# Patient Record
Sex: Female | Born: 1935 | Race: Black or African American | Hispanic: No | Smoking: Never smoker
Health system: Southern US, Community
[De-identification: ages and names within clinical notes are randomized; demographics above are authoritative.]

## PROBLEM LIST (undated history)

## (undated) DIAGNOSIS — K5909 Other constipation: Secondary | ICD-10-CM

## (undated) DIAGNOSIS — M1990 Unspecified osteoarthritis, unspecified site: Secondary | ICD-10-CM

## (undated) DIAGNOSIS — J45909 Unspecified asthma, uncomplicated: Secondary | ICD-10-CM

## (undated) DIAGNOSIS — K589 Irritable bowel syndrome without diarrhea: Secondary | ICD-10-CM

## (undated) DIAGNOSIS — I10 Essential (primary) hypertension: Secondary | ICD-10-CM

## (undated) DIAGNOSIS — G309 Alzheimer's disease, unspecified: Secondary | ICD-10-CM

## (undated) DIAGNOSIS — Z860100 Personal history of colon polyps, unspecified: Secondary | ICD-10-CM

## (undated) DIAGNOSIS — M545 Low back pain, unspecified: Secondary | ICD-10-CM

## (undated) DIAGNOSIS — K219 Gastro-esophageal reflux disease without esophagitis: Secondary | ICD-10-CM

## (undated) DIAGNOSIS — N390 Urinary tract infection, site not specified: Secondary | ICD-10-CM

## (undated) DIAGNOSIS — E7800 Pure hypercholesterolemia, unspecified: Secondary | ICD-10-CM

## (undated) DIAGNOSIS — F0280 Dementia in other diseases classified elsewhere without behavioral disturbance: Secondary | ICD-10-CM

## (undated) DIAGNOSIS — F419 Anxiety disorder, unspecified: Secondary | ICD-10-CM

## (undated) DIAGNOSIS — K5730 Diverticulosis of large intestine without perforation or abscess without bleeding: Secondary | ICD-10-CM

## (undated) DIAGNOSIS — E46 Unspecified protein-calorie malnutrition: Secondary | ICD-10-CM

## (undated) DIAGNOSIS — I6381 Other cerebral infarction due to occlusion or stenosis of small artery: Secondary | ICD-10-CM

## (undated) HISTORY — DX: Gastro-esophageal reflux disease without esophagitis: K21.9

## (undated) HISTORY — DX: Unspecified asthma, uncomplicated: J45.909

## (undated) HISTORY — DX: Diverticulosis of large intestine without perforation or abscess without bleeding: K57.30

## (undated) HISTORY — DX: Unspecified osteoarthritis, unspecified site: M19.90

## (undated) HISTORY — DX: Pure hypercholesterolemia, unspecified: E78.00

## (undated) HISTORY — DX: Low back pain: M54.5

## (undated) HISTORY — DX: Low back pain, unspecified: M54.50

## (undated) HISTORY — DX: Personal history of colonic polyps: Z86.010

## (undated) HISTORY — DX: Irritable bowel syndrome, unspecified: K58.9

## (undated) HISTORY — PX: ABDOMINAL HYSTERECTOMY: SHX81

## (undated) HISTORY — DX: Anxiety disorder, unspecified: F41.9

## (undated) HISTORY — DX: Essential (primary) hypertension: I10

## (undated) HISTORY — DX: Urinary tract infection, site not specified: N39.0

## (undated) HISTORY — DX: Personal history of colon polyps, unspecified: Z86.0100

## (undated) HISTORY — DX: Other cerebral infarction due to occlusion or stenosis of small artery: I63.81

---

## 1999-01-17 ENCOUNTER — Ambulatory Visit (HOSPITAL_COMMUNITY): Admission: RE | Admit: 1999-01-17 | Discharge: 1999-01-17 | Payer: Self-pay | Admitting: Gastroenterology

## 1999-01-17 ENCOUNTER — Encounter: Payer: Self-pay | Admitting: Gastroenterology

## 1999-08-22 ENCOUNTER — Encounter: Admission: RE | Admit: 1999-08-22 | Discharge: 1999-09-07 | Payer: Self-pay | Admitting: Family Medicine

## 1999-10-26 ENCOUNTER — Encounter: Admission: RE | Admit: 1999-10-26 | Discharge: 1999-11-21 | Payer: Self-pay | Admitting: Family Medicine

## 2001-02-16 ENCOUNTER — Encounter: Admission: RE | Admit: 2001-02-16 | Discharge: 2001-03-25 | Payer: Self-pay | Admitting: Family Medicine

## 2002-02-03 ENCOUNTER — Encounter: Admission: RE | Admit: 2002-02-03 | Discharge: 2002-03-05 | Payer: Self-pay | Admitting: Family Medicine

## 2002-11-22 ENCOUNTER — Encounter: Payer: Self-pay | Admitting: Emergency Medicine

## 2002-11-22 ENCOUNTER — Emergency Department (HOSPITAL_COMMUNITY): Admission: EM | Admit: 2002-11-22 | Discharge: 2002-11-22 | Payer: Self-pay | Admitting: Emergency Medicine

## 2003-02-07 ENCOUNTER — Encounter: Admission: RE | Admit: 2003-02-07 | Discharge: 2003-05-08 | Payer: Self-pay | Admitting: Family Medicine

## 2003-05-09 ENCOUNTER — Encounter: Admission: RE | Admit: 2003-05-09 | Discharge: 2003-06-01 | Payer: Self-pay | Admitting: Family Medicine

## 2003-06-10 ENCOUNTER — Ambulatory Visit (HOSPITAL_COMMUNITY): Admission: RE | Admit: 2003-06-10 | Discharge: 2003-06-10 | Payer: Self-pay | Admitting: Gastroenterology

## 2003-12-10 ENCOUNTER — Emergency Department (HOSPITAL_COMMUNITY): Admission: EM | Admit: 2003-12-10 | Discharge: 2003-12-10 | Payer: Self-pay | Admitting: Emergency Medicine

## 2004-05-25 ENCOUNTER — Emergency Department (HOSPITAL_COMMUNITY): Admission: EM | Admit: 2004-05-25 | Discharge: 2004-05-25 | Payer: Self-pay | Admitting: Emergency Medicine

## 2004-05-30 ENCOUNTER — Ambulatory Visit: Payer: Self-pay | Admitting: Gastroenterology

## 2004-06-12 ENCOUNTER — Ambulatory Visit: Payer: Self-pay | Admitting: Pulmonary Disease

## 2004-07-06 ENCOUNTER — Ambulatory Visit: Payer: Self-pay | Admitting: Gastroenterology

## 2004-11-30 ENCOUNTER — Encounter: Admission: RE | Admit: 2004-11-30 | Discharge: 2004-11-30 | Payer: Self-pay | Admitting: Family Medicine

## 2005-01-02 ENCOUNTER — Ambulatory Visit: Payer: Self-pay | Admitting: Pulmonary Disease

## 2005-03-19 ENCOUNTER — Ambulatory Visit: Payer: Self-pay | Admitting: Pulmonary Disease

## 2005-04-09 ENCOUNTER — Ambulatory Visit: Payer: Self-pay | Admitting: Pulmonary Disease

## 2005-10-07 ENCOUNTER — Ambulatory Visit: Payer: Self-pay | Admitting: Pulmonary Disease

## 2005-10-23 ENCOUNTER — Ambulatory Visit: Payer: Self-pay | Admitting: Pulmonary Disease

## 2005-11-10 ENCOUNTER — Emergency Department (HOSPITAL_COMMUNITY): Admission: EM | Admit: 2005-11-10 | Discharge: 2005-11-10 | Payer: Self-pay | Admitting: *Deleted

## 2005-11-15 ENCOUNTER — Ambulatory Visit: Payer: Self-pay | Admitting: Pulmonary Disease

## 2006-01-24 ENCOUNTER — Ambulatory Visit: Payer: Self-pay | Admitting: Pulmonary Disease

## 2006-02-20 ENCOUNTER — Ambulatory Visit: Payer: Self-pay | Admitting: Pulmonary Disease

## 2006-04-17 ENCOUNTER — Ambulatory Visit: Payer: Self-pay | Admitting: Pulmonary Disease

## 2006-05-28 ENCOUNTER — Emergency Department (HOSPITAL_COMMUNITY): Admission: EM | Admit: 2006-05-28 | Discharge: 2006-05-28 | Payer: Self-pay | Admitting: Emergency Medicine

## 2006-06-11 ENCOUNTER — Ambulatory Visit: Payer: Self-pay | Admitting: Pulmonary Disease

## 2006-06-11 LAB — CONVERTED CEMR LAB
Bilirubin Urine: NEGATIVE
Ketones, ur: NEGATIVE mg/dL
Leukocytes, UA: NEGATIVE
Nitrite: NEGATIVE
Specific Gravity, Urine: 1.02 (ref 1.000–1.03)
Total Protein, Urine: NEGATIVE mg/dL
Urine Glucose: NEGATIVE mg/dL
Urobilinogen, UA: 0.2 (ref 0.0–1.0)
pH: 6.5 (ref 5.0–8.0)

## 2006-07-23 ENCOUNTER — Ambulatory Visit: Payer: Self-pay | Admitting: Pulmonary Disease

## 2006-08-01 ENCOUNTER — Encounter: Admission: RE | Admit: 2006-08-01 | Discharge: 2006-08-01 | Payer: Self-pay | Admitting: Family Medicine

## 2006-12-22 ENCOUNTER — Ambulatory Visit: Payer: Self-pay | Admitting: Pulmonary Disease

## 2007-02-02 ENCOUNTER — Ambulatory Visit: Payer: Self-pay | Admitting: Pulmonary Disease

## 2007-02-02 LAB — CONVERTED CEMR LAB
ALT: 13 units/L (ref 0–35)
AST: 17 units/L (ref 0–37)
Albumin: 3.4 g/dL — ABNORMAL LOW (ref 3.5–5.2)
Alkaline Phosphatase: 76 units/L (ref 39–117)
BUN: 9 mg/dL (ref 6–23)
Basophils Absolute: 0.1 10*3/uL (ref 0.0–0.1)
Basophils Relative: 1.2 % — ABNORMAL HIGH (ref 0.0–1.0)
Bilirubin, Direct: 0.1 mg/dL (ref 0.0–0.3)
CO2: 29 meq/L (ref 19–32)
Calcium: 9.1 mg/dL (ref 8.4–10.5)
Chloride: 104 meq/L (ref 96–112)
Cholesterol: 213 mg/dL (ref 0–200)
Creatinine, Ser: 0.7 mg/dL (ref 0.4–1.2)
Direct LDL: 154.1 mg/dL
Eosinophils Absolute: 0.2 10*3/uL (ref 0.0–0.6)
Eosinophils Relative: 3.8 % (ref 0.0–5.0)
GFR calc Af Amer: 102 mL/min
GFR calc non Af Amer: 85 mL/min
Glucose, Bld: 95 mg/dL (ref 70–99)
HCT: 37.5 % (ref 36.0–46.0)
HDL: 34.7 mg/dL — ABNORMAL LOW (ref >39.0)
Hemoglobin: 13.1 g/dL (ref 12.0–15.0)
Lymphocytes Relative: 43.2 % (ref 12.0–46.0)
MCHC: 34.8 g/dL (ref 30.0–36.0)
MCV: 89.3 fL (ref 78.0–100.0)
Monocytes Absolute: 0.5 10*3/uL (ref 0.2–0.7)
Monocytes Relative: 9.3 % (ref 3.0–11.0)
Neutro Abs: 2.1 10*3/uL (ref 1.4–7.7)
Neutrophils Relative %: 42.5 % — ABNORMAL LOW (ref 43.0–77.0)
Platelets: 271 10*3/uL (ref 150–400)
Potassium: 4.1 meq/L (ref 3.5–5.1)
RBC: 4.2 M/uL (ref 3.87–5.11)
RDW: 12.6 % (ref 11.5–14.6)
Sodium: 140 meq/L (ref 135–145)
TSH: 1.48 microintl units/mL (ref 0.35–5.50)
Total Bilirubin: 1 mg/dL (ref 0.3–1.2)
Total CHOL/HDL Ratio: 6.1
Total Protein: 7.3 g/dL (ref 6.0–8.3)
Triglycerides: 127 mg/dL (ref 0–149)
VLDL: 25 mg/dL (ref 0–40)
WBC: 5 10*3/uL (ref 4.5–10.5)

## 2007-04-14 ENCOUNTER — Ambulatory Visit: Payer: Self-pay | Admitting: Pulmonary Disease

## 2007-04-28 ENCOUNTER — Ambulatory Visit: Payer: Self-pay | Admitting: Pulmonary Disease

## 2007-06-10 ENCOUNTER — Telehealth: Payer: Self-pay | Admitting: Pulmonary Disease

## 2007-08-04 DIAGNOSIS — F411 Generalized anxiety disorder: Secondary | ICD-10-CM | POA: Insufficient documentation

## 2007-08-04 DIAGNOSIS — K219 Gastro-esophageal reflux disease without esophagitis: Secondary | ICD-10-CM

## 2007-08-04 DIAGNOSIS — J209 Acute bronchitis, unspecified: Secondary | ICD-10-CM

## 2007-08-04 DIAGNOSIS — M1990 Unspecified osteoarthritis, unspecified site: Secondary | ICD-10-CM | POA: Insufficient documentation

## 2007-08-04 DIAGNOSIS — E7800 Pure hypercholesterolemia, unspecified: Secondary | ICD-10-CM

## 2007-08-04 DIAGNOSIS — M545 Low back pain: Secondary | ICD-10-CM

## 2007-08-04 DIAGNOSIS — D126 Benign neoplasm of colon, unspecified: Secondary | ICD-10-CM

## 2007-08-04 DIAGNOSIS — J309 Allergic rhinitis, unspecified: Secondary | ICD-10-CM | POA: Insufficient documentation

## 2007-08-04 DIAGNOSIS — I10 Essential (primary) hypertension: Secondary | ICD-10-CM | POA: Insufficient documentation

## 2007-08-04 DIAGNOSIS — N390 Urinary tract infection, site not specified: Secondary | ICD-10-CM

## 2007-08-04 DIAGNOSIS — K589 Irritable bowel syndrome without diarrhea: Secondary | ICD-10-CM | POA: Insufficient documentation

## 2007-08-05 ENCOUNTER — Ambulatory Visit: Payer: Self-pay | Admitting: Pulmonary Disease

## 2007-08-06 ENCOUNTER — Telehealth: Payer: Self-pay | Admitting: Pulmonary Disease

## 2007-08-06 ENCOUNTER — Ambulatory Visit: Payer: Self-pay | Admitting: Cardiology

## 2007-08-22 DIAGNOSIS — K5730 Diverticulosis of large intestine without perforation or abscess without bleeding: Secondary | ICD-10-CM | POA: Insufficient documentation

## 2007-08-22 LAB — CONVERTED CEMR LAB
ALT: 15 units/L (ref 0–35)
AST: 16 units/L (ref 0–37)
Albumin: 3.4 g/dL — ABNORMAL LOW (ref 3.5–5.2)
Alkaline Phosphatase: 76 units/L (ref 39–117)
BUN: 16 mg/dL (ref 6–23)
Basophils Absolute: 0.1 10*3/uL (ref 0.0–0.1)
Basophils Relative: 1.5 % — ABNORMAL HIGH (ref 0.0–1.0)
Bilirubin, Direct: 0.2 mg/dL (ref 0.0–0.3)
CO2: 31 meq/L (ref 19–32)
Calcium: 9.5 mg/dL (ref 8.4–10.5)
Chloride: 109 meq/L (ref 96–112)
Creatinine, Ser: 0.8 mg/dL (ref 0.4–1.2)
Eosinophils Absolute: 0.1 10*3/uL (ref 0.0–0.6)
Eosinophils Relative: 3.1 % (ref 0.0–5.0)
GFR calc Af Amer: 88 mL/min
GFR calc non Af Amer: 72 mL/min
Glucose, Bld: 97 mg/dL (ref 70–99)
HCT: 37.8 % (ref 36.0–46.0)
Hemoglobin: 12.7 g/dL (ref 12.0–15.0)
Lymphocytes Relative: 44.5 % (ref 12.0–46.0)
MCHC: 33.7 g/dL (ref 30.0–36.0)
MCV: 91.1 fL (ref 78.0–100.0)
Monocytes Absolute: 0.4 10*3/uL (ref 0.2–0.7)
Monocytes Relative: 7.7 % (ref 3.0–11.0)
Neutro Abs: 2 10*3/uL (ref 1.4–7.7)
Neutrophils Relative %: 43.2 % (ref 43.0–77.0)
Platelets: 258 10*3/uL (ref 150–400)
Potassium: 4.2 meq/L (ref 3.5–5.1)
RBC: 4.15 M/uL (ref 3.87–5.11)
RDW: 12.5 % (ref 11.5–14.6)
Sodium: 145 meq/L (ref 135–145)
TSH: 1.3 microintl units/mL (ref 0.35–5.50)
Total Bilirubin: 0.8 mg/dL (ref 0.3–1.2)
Total Protein: 7.7 g/dL (ref 6.0–8.3)
WBC: 4.6 10*3/uL (ref 4.5–10.5)

## 2007-08-25 LAB — CONVERTED CEMR LAB: Vit D, 1,25-Dihydroxy: 25 — ABNORMAL LOW (ref 30–89)

## 2007-08-27 ENCOUNTER — Ambulatory Visit: Payer: Self-pay | Admitting: Pulmonary Disease

## 2007-12-04 ENCOUNTER — Ambulatory Visit: Payer: Self-pay | Admitting: Pulmonary Disease

## 2008-02-09 ENCOUNTER — Ambulatory Visit: Payer: Self-pay | Admitting: Internal Medicine

## 2008-02-18 ENCOUNTER — Encounter: Payer: Self-pay | Admitting: Pulmonary Disease

## 2008-03-03 ENCOUNTER — Ambulatory Visit: Payer: Self-pay | Admitting: Pulmonary Disease

## 2008-03-07 ENCOUNTER — Telehealth (INDEPENDENT_AMBULATORY_CARE_PROVIDER_SITE_OTHER): Payer: Self-pay | Admitting: *Deleted

## 2008-03-09 LAB — CONVERTED CEMR LAB
ALT: 16 units/L (ref 0–35)
AST: 21 units/L (ref 0–37)
Albumin: 3.9 g/dL (ref 3.5–5.2)
Alkaline Phosphatase: 78 units/L (ref 39–117)
BUN: 11 mg/dL (ref 6–23)
Basophils Absolute: 0 10*3/uL (ref 0.0–0.1)
Basophils Relative: 0.1 % (ref 0.0–3.0)
Bilirubin, Direct: 0.1 mg/dL (ref 0.0–0.3)
CO2: 30 meq/L (ref 19–32)
Calcium: 9.8 mg/dL (ref 8.4–10.5)
Chloride: 105 meq/L (ref 96–112)
Cholesterol: 240 mg/dL (ref 0–200)
Creatinine, Ser: 0.8 mg/dL (ref 0.4–1.2)
Direct LDL: 168.6 mg/dL
Eosinophils Absolute: 0.2 10*3/uL (ref 0.0–0.7)
Eosinophils Relative: 4.7 % (ref 0.0–5.0)
GFR calc Af Amer: 87 mL/min
GFR calc non Af Amer: 72 mL/min
Glucose, Bld: 96 mg/dL (ref 70–99)
HCT: 42.1 % (ref 36.0–46.0)
HDL: 33.7 mg/dL — ABNORMAL LOW (ref >39.0)
Hemoglobin: 14.1 g/dL (ref 12.0–15.0)
Lymphocytes Relative: 39.2 % (ref 12.0–46.0)
MCHC: 33.8 g/dL (ref 30.0–36.0)
MCV: 92.2 fL (ref 78.0–100.0)
Monocytes Absolute: 0.3 10*3/uL (ref 0.1–1.0)
Monocytes Relative: 5.5 % (ref 3.0–12.0)
Neutro Abs: 2.7 10*3/uL (ref 1.4–7.7)
Neutrophils Relative %: 50.5 % (ref 43.0–77.0)
Platelets: 232 10*3/uL (ref 150–400)
Potassium: 4.4 meq/L (ref 3.5–5.1)
RBC: 4.58 M/uL (ref 3.87–5.11)
RDW: 12.3 % (ref 11.5–14.6)
Sodium: 141 meq/L (ref 135–145)
TSH: 1.66 microintl units/mL (ref 0.35–5.50)
Total Bilirubin: 1.1 mg/dL (ref 0.3–1.2)
Total CHOL/HDL Ratio: 7.1
Total Protein: 7.8 g/dL (ref 6.0–8.3)
Triglycerides: 106 mg/dL (ref 0–149)
VLDL: 21 mg/dL (ref 0–40)
Vit D, 1,25-Dihydroxy: 21 — ABNORMAL LOW (ref 30–89)
WBC: 5 10*3/uL (ref 4.5–10.5)

## 2008-03-10 ENCOUNTER — Telehealth (INDEPENDENT_AMBULATORY_CARE_PROVIDER_SITE_OTHER): Payer: Self-pay | Admitting: *Deleted

## 2008-04-01 ENCOUNTER — Emergency Department (HOSPITAL_COMMUNITY): Admission: EM | Admit: 2008-04-01 | Discharge: 2008-04-01 | Payer: Self-pay | Admitting: Family Medicine

## 2008-07-05 ENCOUNTER — Ambulatory Visit: Payer: Self-pay | Admitting: Pulmonary Disease

## 2008-07-06 DIAGNOSIS — E559 Vitamin D deficiency, unspecified: Secondary | ICD-10-CM | POA: Insufficient documentation

## 2008-11-09 ENCOUNTER — Ambulatory Visit: Payer: Self-pay | Admitting: Pulmonary Disease

## 2009-01-02 ENCOUNTER — Telehealth (INDEPENDENT_AMBULATORY_CARE_PROVIDER_SITE_OTHER): Payer: Self-pay | Admitting: *Deleted

## 2009-01-09 ENCOUNTER — Ambulatory Visit: Payer: Self-pay | Admitting: Pulmonary Disease

## 2009-01-09 LAB — CONVERTED CEMR LAB
ALT: 6 units/L (ref 0–35)
AST: 23 units/L (ref 0–37)
Albumin: 3.6 g/dL (ref 3.5–5.2)
Alkaline Phosphatase: 72 units/L (ref 39–117)
BUN: 17 mg/dL (ref 6–23)
Basophils Absolute: 0 10*3/uL (ref 0.0–0.1)
Basophils Relative: 0.2 % (ref 0.0–3.0)
Bilirubin, Direct: 0.3 mg/dL (ref 0.0–0.3)
CO2: 29 meq/L (ref 19–32)
Calcium: 9.2 mg/dL (ref 8.4–10.5)
Chloride: 106 meq/L (ref 96–112)
Cholesterol: 138 mg/dL (ref 0–200)
Creatinine, Ser: 0.7 mg/dL (ref 0.4–1.2)
Eosinophils Absolute: 0.1 10*3/uL (ref 0.0–0.7)
Eosinophils Relative: 2.5 % (ref 0.0–5.0)
GFR calc non Af Amer: 101.72 mL/min (ref >60)
Glucose, Bld: 86 mg/dL (ref 70–99)
HCT: 35.5 % — ABNORMAL LOW (ref 36.0–46.0)
HDL: 35.7 mg/dL — ABNORMAL LOW (ref >39.00)
Hemoglobin: 11.8 g/dL — ABNORMAL LOW (ref 12.0–15.0)
LDL Cholesterol: 86 mg/dL (ref 0–99)
Lymphocytes Relative: 38.8 % (ref 12.0–46.0)
Lymphs Abs: 1.7 10*3/uL (ref 0.7–4.0)
MCHC: 33.4 g/dL (ref 30.0–36.0)
MCV: 91.9 fL (ref 78.0–100.0)
Monocytes Absolute: 0.4 10*3/uL (ref 0.1–1.0)
Monocytes Relative: 8.5 % (ref 3.0–12.0)
Neutro Abs: 2.3 10*3/uL (ref 1.4–7.7)
Neutrophils Relative %: 50 % (ref 43.0–77.0)
Platelets: 197 10*3/uL (ref 150.0–400.0)
Potassium: 4.3 meq/L (ref 3.5–5.1)
RBC: 3.86 M/uL — ABNORMAL LOW (ref 3.87–5.11)
RDW: 12.2 % (ref 11.5–14.6)
Sodium: 142 meq/L (ref 135–145)
TSH: 1.37 microintl units/mL (ref 0.35–5.50)
Total Bilirubin: 1.2 mg/dL (ref 0.3–1.2)
Total CHOL/HDL Ratio: 4
Total Protein: 6.9 g/dL (ref 6.0–8.3)
Triglycerides: 80 mg/dL (ref 0.0–149.0)
VLDL: 16 mg/dL (ref 0.0–40.0)
WBC: 4.5 10*3/uL (ref 4.5–10.5)

## 2009-01-25 ENCOUNTER — Emergency Department (HOSPITAL_COMMUNITY): Admission: EM | Admit: 2009-01-25 | Discharge: 2009-01-25 | Payer: Self-pay | Admitting: Emergency Medicine

## 2009-04-10 ENCOUNTER — Emergency Department (HOSPITAL_COMMUNITY): Admission: EM | Admit: 2009-04-10 | Discharge: 2009-04-10 | Payer: Self-pay | Admitting: Emergency Medicine

## 2009-04-12 ENCOUNTER — Telehealth: Payer: Self-pay | Admitting: Pulmonary Disease

## 2009-04-22 ENCOUNTER — Emergency Department (HOSPITAL_COMMUNITY): Admission: EM | Admit: 2009-04-22 | Discharge: 2009-04-22 | Payer: Self-pay | Admitting: Emergency Medicine

## 2009-04-28 ENCOUNTER — Ambulatory Visit: Payer: Self-pay | Admitting: Internal Medicine

## 2009-05-11 ENCOUNTER — Encounter: Payer: Self-pay | Admitting: Pulmonary Disease

## 2009-07-10 ENCOUNTER — Ambulatory Visit: Payer: Self-pay | Admitting: Pulmonary Disease

## 2009-07-10 DIAGNOSIS — R930 Abnormal findings on diagnostic imaging of skull and head, not elsewhere classified: Secondary | ICD-10-CM

## 2009-07-11 DIAGNOSIS — Z8673 Personal history of transient ischemic attack (TIA), and cerebral infarction without residual deficits: Secondary | ICD-10-CM | POA: Insufficient documentation

## 2009-07-16 LAB — CONVERTED CEMR LAB
ALT: 13 units/L (ref 0–35)
AST: 22 units/L (ref 0–37)
Albumin: 3.8 g/dL (ref 3.5–5.2)
Alkaline Phosphatase: 68 units/L (ref 39–117)
BUN: 18 mg/dL (ref 6–23)
Basophils Absolute: 0 10*3/uL (ref 0.0–0.1)
Basophils Relative: 0 % (ref 0.0–3.0)
Bilirubin, Direct: 0.2 mg/dL (ref 0.0–0.3)
CO2: 27 meq/L (ref 19–32)
Calcium: 9.7 mg/dL (ref 8.4–10.5)
Chloride: 104 meq/L (ref 96–112)
Cholesterol: 152 mg/dL (ref 0–200)
Creatinine, Ser: 0.7 mg/dL (ref 0.4–1.2)
Eosinophils Absolute: 0.1 10*3/uL (ref 0.0–0.7)
Eosinophils Relative: 3.1 % (ref 0.0–5.0)
GFR calc non Af Amer: 101.6 mL/min (ref >60)
Glucose, Bld: 77 mg/dL (ref 70–99)
HCT: 39.5 % (ref 36.0–46.0)
HDL: 41.1 mg/dL (ref >39.00)
Hemoglobin: 12.8 g/dL (ref 12.0–15.0)
LDL Cholesterol: 94 mg/dL (ref 0–99)
Lymphocytes Relative: 50.6 % — ABNORMAL HIGH (ref 12.0–46.0)
Lymphs Abs: 2 10*3/uL (ref 0.7–4.0)
MCHC: 32.5 g/dL (ref 30.0–36.0)
MCV: 94.4 fL (ref 78.0–100.0)
Monocytes Absolute: 0.3 10*3/uL (ref 0.1–1.0)
Monocytes Relative: 8.7 % (ref 3.0–12.0)
Neutro Abs: 1.4 10*3/uL (ref 1.4–7.7)
Neutrophils Relative %: 37.6 % — ABNORMAL LOW (ref 43.0–77.0)
Platelets: 188 10*3/uL (ref 150.0–400.0)
Potassium: 4.6 meq/L (ref 3.5–5.1)
RBC: 4.18 M/uL (ref 3.87–5.11)
RDW: 12.7 % (ref 11.5–14.6)
Sodium: 139 meq/L (ref 135–145)
TSH: 1.3 microintl units/mL (ref 0.35–5.50)
Total Bilirubin: 0.9 mg/dL (ref 0.3–1.2)
Total CHOL/HDL Ratio: 4
Total Protein: 7.6 g/dL (ref 6.0–8.3)
Triglycerides: 84 mg/dL (ref 0.0–149.0)
VLDL: 16.8 mg/dL (ref 0.0–40.0)
WBC: 3.8 10*3/uL — ABNORMAL LOW (ref 4.5–10.5)

## 2009-07-17 LAB — CONVERTED CEMR LAB: Vit D, 25-Hydroxy: 28 ng/mL — ABNORMAL LOW (ref 30–89)

## 2009-07-18 ENCOUNTER — Ambulatory Visit: Payer: Self-pay | Admitting: Cardiovascular Disease

## 2009-10-10 ENCOUNTER — Ambulatory Visit: Payer: Self-pay | Admitting: Pulmonary Disease

## 2009-11-27 ENCOUNTER — Emergency Department (HOSPITAL_COMMUNITY): Admission: EM | Admit: 2009-11-27 | Discharge: 2009-11-27 | Payer: Self-pay | Admitting: Emergency Medicine

## 2010-02-07 ENCOUNTER — Ambulatory Visit: Payer: Self-pay | Admitting: Pulmonary Disease

## 2010-02-08 LAB — CONVERTED CEMR LAB
ALT: 13 units/L (ref 0–35)
AST: 17 units/L (ref 0–37)
Albumin: 4 g/dL (ref 3.5–5.2)
Alkaline Phosphatase: 72 units/L (ref 39–117)
BUN: 14 mg/dL (ref 6–23)
Basophils Absolute: 0.1 10*3/uL (ref 0.0–0.1)
Basophils Relative: 1.3 % (ref 0.0–3.0)
Bilirubin Urine: NEGATIVE
Bilirubin, Direct: 0.2 mg/dL (ref 0.0–0.3)
CO2: 31 meq/L (ref 19–32)
Calcium: 10 mg/dL (ref 8.4–10.5)
Chloride: 103 meq/L (ref 96–112)
Cholesterol: 242 mg/dL — ABNORMAL HIGH (ref 0–200)
Creatinine, Ser: 0.8 mg/dL (ref 0.4–1.2)
Direct LDL: 189.3 mg/dL
Eosinophils Absolute: 0 10*3/uL (ref 0.0–0.7)
Eosinophils Relative: 0.9 % (ref 0.0–5.0)
GFR calc non Af Amer: 89.56 mL/min (ref >60)
Glucose, Bld: 87 mg/dL (ref 70–99)
HCT: 40.2 % (ref 36.0–46.0)
HDL: 48.1 mg/dL (ref >39.00)
Hemoglobin: 13.8 g/dL (ref 12.0–15.0)
Ketones, ur: NEGATIVE mg/dL
Lymphocytes Relative: 38.1 % (ref 12.0–46.0)
Lymphs Abs: 1.5 10*3/uL (ref 0.7–4.0)
MCHC: 34.5 g/dL (ref 30.0–36.0)
MCV: 92.9 fL (ref 78.0–100.0)
Monocytes Absolute: 0.4 10*3/uL (ref 0.1–1.0)
Monocytes Relative: 8.8 % (ref 3.0–12.0)
Neutro Abs: 2.1 10*3/uL (ref 1.4–7.7)
Neutrophils Relative %: 50.9 % (ref 43.0–77.0)
Nitrite: NEGATIVE
Platelets: 258 10*3/uL (ref 150.0–400.0)
Potassium: 4.4 meq/L (ref 3.5–5.1)
RBC: 4.32 M/uL (ref 3.87–5.11)
RDW: 13.1 % (ref 11.5–14.6)
Sodium: 142 meq/L (ref 135–145)
Specific Gravity, Urine: 1.02 (ref 1.000–1.030)
Total Bilirubin: 1.2 mg/dL (ref 0.3–1.2)
Total CHOL/HDL Ratio: 5
Total Protein, Urine: NEGATIVE mg/dL
Total Protein: 7.3 g/dL (ref 6.0–8.3)
Triglycerides: 67 mg/dL (ref 0.0–149.0)
Urine Glucose: NEGATIVE mg/dL
Urobilinogen, UA: 0.2 (ref 0.0–1.0)
VLDL: 13.4 mg/dL (ref 0.0–40.0)
WBC: 4.1 10*3/uL — ABNORMAL LOW (ref 4.5–10.5)
pH: 6 (ref 5.0–8.0)

## 2010-04-09 ENCOUNTER — Encounter: Payer: Self-pay | Admitting: Pulmonary Disease

## 2010-04-09 ENCOUNTER — Ambulatory Visit: Payer: Self-pay | Admitting: Pulmonary Disease

## 2010-04-09 DIAGNOSIS — R079 Chest pain, unspecified: Secondary | ICD-10-CM

## 2010-07-10 ENCOUNTER — Encounter: Payer: Self-pay | Admitting: Pulmonary Disease

## 2010-07-10 ENCOUNTER — Ambulatory Visit
Admission: RE | Admit: 2010-07-10 | Discharge: 2010-07-10 | Payer: Self-pay | Source: Home / Self Care | Attending: Pulmonary Disease | Admitting: Pulmonary Disease

## 2010-07-10 ENCOUNTER — Other Ambulatory Visit: Payer: Self-pay | Admitting: Pulmonary Disease

## 2010-07-10 LAB — BASIC METABOLIC PANEL
BUN: 15 mg/dL (ref 6–23)
CO2: 30 mEq/L (ref 19–32)
Calcium: 9.4 mg/dL (ref 8.4–10.5)
Chloride: 107 mEq/L (ref 96–112)
Creatinine, Ser: 0.8 mg/dL (ref 0.4–1.2)
GFR: 82.14 mL/min (ref >60.00)
Glucose, Bld: 87 mg/dL (ref 70–99)
Potassium: 4.6 mEq/L (ref 3.5–5.1)
Sodium: 143 mEq/L (ref 135–145)

## 2010-07-10 LAB — CBC WITH DIFFERENTIAL/PLATELET
Basophils Absolute: 0 10*3/uL (ref 0.0–0.1)
Basophils Relative: 0.9 % (ref 0.0–3.0)
Eosinophils Absolute: 0.2 10*3/uL (ref 0.0–0.7)
Eosinophils Relative: 3.4 % (ref 0.0–5.0)
HCT: 39.2 % (ref 36.0–46.0)
Hemoglobin: 13.1 g/dL (ref 12.0–15.0)
Lymphocytes Relative: 34.7 % (ref 12.0–46.0)
Lymphs Abs: 1.6 10*3/uL (ref 0.7–4.0)
MCHC: 33.5 g/dL (ref 30.0–36.0)
MCV: 94.2 fl (ref 78.0–100.0)
Monocytes Absolute: 0.4 10*3/uL (ref 0.1–1.0)
Monocytes Relative: 8.3 % (ref 3.0–12.0)
Neutro Abs: 2.5 10*3/uL (ref 1.4–7.7)
Neutrophils Relative %: 52.7 % (ref 43.0–77.0)
Platelets: 227 10*3/uL (ref 150.0–400.0)
RBC: 4.16 Mil/uL (ref 3.87–5.11)
RDW: 13.1 % (ref 11.5–14.6)
WBC: 4.7 10*3/uL (ref 4.5–10.5)

## 2010-07-10 LAB — LIPID PANEL
Cholesterol: 248 mg/dL — ABNORMAL HIGH (ref 0–200)
HDL: 49.3 mg/dL (ref >39.00)
Total CHOL/HDL Ratio: 5
Triglycerides: 122 mg/dL (ref 0.0–149.0)
VLDL: 24.4 mg/dL (ref 0.0–40.0)

## 2010-07-10 LAB — HEPATIC FUNCTION PANEL
ALT: 16 U/L (ref 0–35)
AST: 20 U/L (ref 0–37)
Albumin: 3.7 g/dL (ref 3.5–5.2)
Alkaline Phosphatase: 78 U/L (ref 39–117)
Bilirubin, Direct: 0.2 mg/dL (ref 0.0–0.3)
Total Bilirubin: 0.8 mg/dL (ref 0.3–1.2)
Total Protein: 6.8 g/dL (ref 6.0–8.3)

## 2010-07-10 LAB — TSH: TSH: 2.03 u[IU]/mL (ref 0.35–5.50)

## 2010-07-10 LAB — LDL CHOLESTEROL, DIRECT: Direct LDL: 178.5 mg/dL

## 2010-07-13 LAB — CONVERTED CEMR LAB: Vit D, 25-Hydroxy: 40 ng/mL (ref 30–89)

## 2010-07-26 NOTE — Assessment & Plan Note (Signed)
Summary: Acute NP office visit - bronchitis   CC:  cough occ producing green mucus, increased SOB, some wheezing, hoarseness, HA, and PND. Marland Kitchen  History of Present Illness: 77 yo female with known history of HTN, GERD, Hyperlipdiemia, and AB  04/28/09--Presents for follow up. She recently had cough, that has improved. Last week right eye is red, busted blood vessel while coughing. No change in vision, no headache, no drainage. We reveiwed labs from last visit, they were essentially unremarkable. Denies chest pain, dyspnea, orthopnea, hemoptysis, fever, n/v/d, edema, headache. Complains of constipation, Had to go to ER few weeks ago for ? impaction , given suppository and enema w/ bowel movement.   October 10, 2009--Presents for an acute office visit. Complains of increased SOB, wheezing, prod cough with green mucus, night sweats/chills x4weeks, states has been treating with OTC therapies. Recent CT chest 06/2009 showed no suspcious pulmonary lesions or acute process.    February 07, 2010--Presents to for an acute office visit. Complains of cough occ producing green mucus, increased SOB, some wheezing, hoarseness, HA, PND.  Has had some urinary frequency and dysuria on/off. Wants labs and urine tested. Daughter is very sick, and is on life support. She is very upset and nervous over this.  cough is not getting any better w/ otc cold meds. Denies chest pain, dyspnea, orthopnea, hemoptysis, fever, n/v/d, edema, headache,   Medications Prior to Update: 1)  Symbicort 80-4.5 Mcg/act  Aero (Budesonide-Formoterol Fumarate) .... Use 2 Sprays Twice Daily.Marland KitchenMarland Kitchen 2)  Proventil Hfa 108 (90 Base) Mcg/act  Aers (Albuterol Sulfate) .... Inhale 2 Puffs Every 4 Hours As Needed 3)  Adult Aspirin Low Strength 81 Mg  Tbdp (Aspirin) .Marland Kitchen.. 1 Tab Daily.Marland KitchenMarland Kitchen 4)  Cozaar 50 Mg Tabs (Losartan Potassium) .... Take 1 Tablet By Mouth Once A Day 5)  Hydrochlorothiazide 25 Mg  Tabs (Hydrochlorothiazide) .... Take 1/2 Tablet By Mouth Once  Daily 6)  Simvastatin 40 Mg Tabs (Simvastatin) .... Take One Tablet By Mouth At Bedtime 7)  Fish Oil 1000 Mg  Caps (Omega-3 Fatty Acids) .... Take 1 Tablet By Mouth Once A Day 8)  Cod Liver Oil 1000 Mg  Caps (Cod Liver Oil) .... Take One Capsule By Mouth Once Daily 9)  Prilosec 20 Mg  Cpdr (Omeprazole) .... Take 1 Tab By Mouth Once Daily- 30 Min Before The First Meal of The Day. 10)  Bentyl 20 Mg  Tabs (Dicyclomine Hcl) .... Take 1 Tablet By Mouth Every 6 Hours As Needed For Abd. Cramping 11)  Miralax  Powd (Polyethylene Glycol 3350) .Marland Kitchen.. 1 Capful in Water Daily... 12)  Senokot S 8.6-50 Mg Tabs (Sennosides-Docusate Sodium) .Marland Kitchen.. 1-2 Tabs By Mouth At Bedtime... 13)  Meloxicam 7.5 Mg Tabs (Meloxicam) .... Take 1 Tab By Mouth Once Daily As Needed For Pain... 14)  Caltrate 600+d 600-400 Mg-Unit  Tabs (Calcium Carbonate-Vitamin D) .Marland Kitchen.. 1 Tab Twice Daily For Bone Health... 15)  Multivitamins   Tabs (Multiple Vitamin) .... Take 1 Tablet By Mouth Once A Day 16)  Vitamin D3 1000 Unit Caps (Cholecalciferol) .... Take 1-2 Caps By Mouth Once Daily (Otc Med)... 17)  Alprazolam 0.25 Mg Tabs (Alprazolam) .... 1/2-1 By Mouth Two Times A Day As Needed Anxiety Archer Asa 18)  Mucinex Maximum Strength 1200 Mg Xr12h-Tab (Guaifenesin) .... Take 1 Tab By Mouth Two Times A Day W/ Plenty of Fluids... 19)  Cefdinir 300 Mg Caps (Cefdinir) .Marland Kitchen.. 1 By Mouth Two Times A Day 20)  Hydromet 5-1.5 Mg/14ml Syrp (Hydrocodone-Homatropine) .... 1/2-1 Tsp Every  8 Hr As Needed Cough , May Make You Sleepy  Current Medications (verified): 1)  Symbicort 80-4.5 Mcg/act  Aero (Budesonide-Formoterol Fumarate) .... Use 2 Sprays Twice Daily.Marland KitchenMarland Kitchen 2)  Proventil Hfa 108 (90 Base) Mcg/act  Aers (Albuterol Sulfate) .... Inhale 2 Puffs Every 4 Hours As Needed 3)  Adult Aspirin Low Strength 81 Mg  Tbdp (Aspirin) .Marland Kitchen.. 1 Tab Daily.Marland KitchenMarland Kitchen 4)  Cozaar 50 Mg Tabs (Losartan Potassium) .... Take 1 Tablet By Mouth Once A Day 5)  Hydrochlorothiazide 25 Mg  Tabs  (Hydrochlorothiazide) .... Take 1/2 Tablet By Mouth Once Daily 6)  Simvastatin 40 Mg Tabs (Simvastatin) .... Take One Tablet By Mouth At Bedtime 7)  Fish Oil 1000 Mg  Caps (Omega-3 Fatty Acids) .... Take 1 Tablet By Mouth Once A Day 8)  Cod Liver Oil 1000 Mg  Caps (Cod Liver Oil) .... Take One Capsule By Mouth Once Daily 9)  Prilosec 20 Mg  Cpdr (Omeprazole) .... Take 1 Tab By Mouth Once Daily- 30 Min Before The First Meal of The Day. 10)  Bentyl 20 Mg  Tabs (Dicyclomine Hcl) .... Take 1 Tablet By Mouth Every 6 Hours As Needed For Abd. Cramping 11)  Miralax  Powd (Polyethylene Glycol 3350) .Marland Kitchen.. 1 Capful in Water Daily... 12)  Senokot S 8.6-50 Mg Tabs (Sennosides-Docusate Sodium) .Marland Kitchen.. 1-2 Tabs By Mouth At Bedtime... 13)  Meloxicam 7.5 Mg Tabs (Meloxicam) .... Take 1 Tab By Mouth Once Daily As Needed For Pain... 14)  Caltrate 600+d 600-400 Mg-Unit  Tabs (Calcium Carbonate-Vitamin D) .Marland Kitchen.. 1 Tab Twice Daily For Bone Health... 15)  Multivitamins   Tabs (Multiple Vitamin) .... Take 1 Tablet By Mouth Once A Day 16)  Vitamin D3 1000 Unit Caps (Cholecalciferol) .... Take 1-2 Caps By Mouth Once Daily (Otc Med)... 17)  Alprazolam 0.25 Mg Tabs (Alprazolam) .... 1/2-1 By Mouth Two Times A Day As Needed Anxiety Archer Asa 18)  Mucinex Maximum Strength 1200 Mg Xr12h-Tab (Guaifenesin) .... Take 1 Tab By Mouth Two Times A Day W/ Plenty of Fluids...  Allergies: 1)  ! * Ketek 2)  ! * Hydromet Syrup  Past History:  Past Medical History: Last updated: 07/10/2009  ALLERGIC RHINITIS (ICD-477.9) ASTHMATIC BRONCHITIS, ACUTE (ICD-466.0) HYPERTENSION (ICD-401.9) HYPERCHOLESTEROLEMIA (ICD-272.0) GERD (ICD-530.81) DIVERTICULOSIS OF COLON (ICD-562.10) IRRITABLE BOWEL SYNDROME (ICD-564.1) COLONIC POLYPS (ICD-211.3) UTI (ICD-599.0) DEGENERATIVE JOINT DISEASE (ICD-715.90) BACK PAIN, LUMBAR (ICD-724.2) Hx of LACUNAR INFARCTION (ICD-434.91) VITAMIN D DEFICIENCY (ICD-268.9) ANXIETY (ICD-300.00)  Family  History: Last updated: 01/09/2009 family history of heart disease and cancer in mother father, sister and brother died with cancer mother deceased age 63 from cancer--unknown type of cancer father deceased age 53 from cancer--unknown type of cancer 1 brother deceased from stomach cancer 1 brother deceased from cancer 1 brother deceased age 27 from cancer 1 sister deceased during childbirth 1 sister deceased deceased from cancer 1 sister alive--adopted sister--hx of stroke 1 sister deceased  Social History: Last updated: 12/04/2007 retired never smoked no etoh widowed with 6 children  Risk Factors: Smoking Status: never (07/05/2008)  Review of Systems      See HPI  Vital Signs:  Patient profile:   77 year old female Height:      66 inches Weight:      147 pounds BMI:     23.81 O2 Sat:      97 % on Room air Temp:     97.2 degrees F oral Pulse rate:   79 / minute BP sitting:   120 / 60  (left arm)  Cuff size:   regular  Vitals Entered By: Boone Master CNA/MA (February 07, 2010 9:42 AM)  O2 Flow:  Room air CC: cough occ producing green mucus, increased SOB, some wheezing, hoarseness, HA, PND.  Is Patient Diabetic? No Comments Medications reviewed with patient Daytime contact number verified with patient. Boone Master CNA/MA  February 07, 2010 9:41 AM    Physical Exam  Additional Exam:  GEN: A/Ox3; pleasant , NAD HEENT:  [place]/AT, , EACs-clear, TMs-wnl, NOSE-clear, THROAT-clear NECK:  Supple w/ fair ROM; no JVD; normal carotid impulses w/o bruits; no thyromegaly or nodules palpated; no lymphadenopathy. RESP  Coarse BS w/ no wheeizng.  CARD:  RRR, no m/r/g   GI:   Soft & nt; nml bowel sounds; no organomegaly or masses detected. [place]: Warm bil,  no calf tenderness edema, clubbing, pulses intact Neuro: intact no focal deficits noted.    Impression & Recommendations:  Problem # 1:  ASTHMATIC BRONCHITIS, ACUTE (ICD-466.0)  Exacerbation  REC:  Begin Levaquin 500mg   once daily for 7 days  Mucinex DM two times a day as needed cough and congestion  Increase fluids.  I will call with labs.  Please contact office for sooner follow up if symptoms do not improve or worsen  follow up Dr. Kriste Basque in 3 months   The following medications were removed from the medication list:    Cefdinir 300 Mg Caps (Cefdinir) .Marland Kitchen... 1 by mouth two times a day    Hydromet 5-1.5 Mg/40ml Syrp (Hydrocodone-homatropine) .Marland Kitchen... 1/2-1 tsp every 8 hr as needed cough , may make you sleepy Her updated medication list for this problem includes:    Symbicort 80-4.5 Mcg/act Aero (Budesonide-formoterol fumarate) ..... Use 2 sprays twice daily...    Proventil Hfa 108 (90 Base) Mcg/act Aers (Albuterol sulfate) ..... Inhale 2 puffs every 4 hours as needed    Mucinex Maximum Strength 1200 Mg Xr12h-tab (Guaifenesin) .Marland Kitchen... Take 1 tab by mouth two times a day w/ plenty of fluids...    Levaquin 500 Mg Tabs (Levofloxacin) .Marland Kitchen... 1 by mouth once daily  Orders: Est. Patient Level IV (16109)  Problem # 2:  HYPERTENSION (ICD-401.9) controlled on rx  Her updated medication list for this problem includes:    Cozaar 50 Mg Tabs (Losartan potassium) .Marland Kitchen... Take 1 tablet by mouth once a day    Hydrochlorothiazide 25 Mg Tabs (Hydrochlorothiazide) .Marland Kitchen... Take 1/2 tablet by mouth once daily  Orders: T-Urine Culture (Spectrum Order) [id number]) TLB-CBC Platelet - w/Differential (85025-CBCD) TLB-BMP (Basic Metabolic Panel-BMET) (80048-METABOL) TLB-Udip w/ Micro (81001-URINE) Est. Patient Level IV (99214)  BP today: 120/60 Prior BP: 156/88 (10/10/2009)  Labs Reviewed: K+: 4.6 (07/10/2009) Creat: : 0.7 (07/10/2009)   Chol: 152 (07/10/2009)   HDL: 41.10 (07/10/2009)   LDL: 94 (07/10/2009)   TG: 84.0 (07/10/2009)  Problem # 3:  HYPERCHOLESTEROLEMIA (ICD-272.0)  fasting labs pending.  Her updated medication list for this problem includes:    Simvastatin 40 Mg Tabs (Simvastatin) .Marland Kitchen... Take one tablet by mouth  at bedtime  Orders: TLB-Lipid Panel (80061-LIPID) TLB-Hepatic/Liver Function Pnl (80076-HEPATIC) Est. Patient Level IV (91478)  Medications Added to Medication List This Visit: 1)  Levaquin 500 Mg Tabs (Levofloxacin) .Marland Kitchen.. 1 by mouth once daily  Complete Medication List: 1)  Symbicort 80-4.5 Mcg/act Aero (Budesonide-formoterol fumarate) .... Use 2 sprays twice daily.Marland KitchenMarland Kitchen 2)  Proventil Hfa 108 (90 Base) Mcg/act Aers (Albuterol sulfate) .... Inhale 2 puffs every 4 hours as needed 3)  Adult Aspirin Low Strength 81 Mg Tbdp (Aspirin) .Marland Kitchen.. 1 tab  daily.Marland KitchenMarland Kitchen 4)  Cozaar 50 Mg Tabs (Losartan potassium) .... Take 1 tablet by mouth once a day 5)  Hydrochlorothiazide 25 Mg Tabs (Hydrochlorothiazide) .... Take 1/2 tablet by mouth once daily 6)  Simvastatin 40 Mg Tabs (Simvastatin) .... Take one tablet by mouth at bedtime 7)  Fish Oil 1000 Mg Caps (Omega-3 fatty acids) .... Take 1 tablet by mouth once a day 8)  Cod Liver Oil 1000 Mg Caps (Cod liver oil) .... Take one capsule by mouth once daily 9)  Prilosec 20 Mg Cpdr (Omeprazole) .... Take 1 tab by mouth once daily- 30 min before the first meal of the day. 10)  Bentyl 20 Mg Tabs (Dicyclomine hcl) .... Take 1 tablet by mouth every 6 hours as needed for abd. cramping 11)  Miralax Powd (Polyethylene glycol 3350) .Marland Kitchen.. 1 capful in water daily... 12)  Senokot S 8.6-50 Mg Tabs (Sennosides-docusate sodium) .Marland Kitchen.. 1-2 tabs by mouth at bedtime... 13)  Meloxicam 7.5 Mg Tabs (Meloxicam) .... Take 1 tab by mouth once daily as needed for pain... 14)  Caltrate 600+d 600-400 Mg-unit Tabs (Calcium carbonate-vitamin d) .Marland Kitchen.. 1 tab twice daily for bone health... 15)  Multivitamins Tabs (Multiple vitamin) .... Take 1 tablet by mouth once a day 16)  Vitamin D3 1000 Unit Caps (Cholecalciferol) .... Take 1-2 caps by mouth once daily (otc med)... 17)  Alprazolam 0.25 Mg Tabs (Alprazolam) .... 1/2-1 by mouth two times a day as needed anxiety /nerves 18)  Mucinex Maximum Strength 1200  Mg Xr12h-tab (Guaifenesin) .... Take 1 tab by mouth two times a day w/ plenty of fluids... 19)  Levaquin 500 Mg Tabs (Levofloxacin) .Marland Kitchen.. 1 by mouth once daily  Patient Instructions: 1)  Begin Levaquin 500mg  once daily for 7 days  2)  Mucinex DM two times a day as needed cough and congestion  3)  Increase fluids.  4)  I will call with labs.  5)  Please contact office for sooner follow up if symptoms do not improve or worsen  6)  follow up Dr. Kriste Basque in 3 months  Prescriptions: LEVAQUIN 500 MG TABS (LEVOFLOXACIN) 1 by mouth once daily  #7 x 0   Entered and Authorized by:   Rubye Oaks NP   Signed by:   Tammy Parrett NP on 02/07/2010   Method used:   Print then Give to Patient   RxID:   [id number]
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
## 2010-09-10 LAB — HEMOCCULT GUIAC POC 1CARD (OFFICE): Fecal Occult Bld: NEGATIVE

## 2010-09-24 ENCOUNTER — Other Ambulatory Visit: Payer: Self-pay | Admitting: Pulmonary Disease

## 2010-09-27 LAB — URINALYSIS, ROUTINE W REFLEX MICROSCOPIC
Bilirubin Urine: NEGATIVE
Glucose, UA: NEGATIVE mg/dL
Hgb urine dipstick: NEGATIVE
Ketones, ur: NEGATIVE mg/dL
Nitrite: NEGATIVE
Protein, ur: NEGATIVE mg/dL
Specific Gravity, Urine: 1.004 — ABNORMAL LOW (ref 1.005–1.030)
Urobilinogen, UA: 0.2 mg/dL (ref 0.0–1.0)
pH: 7 (ref 5.0–8.0)

## 2010-09-27 LAB — LACTIC ACID, PLASMA: Lactic Acid, Venous: 1.3 mmol/L (ref 0.5–2.2)

## 2010-09-27 LAB — CBC
Platelets: 227 10*3/uL (ref 150–400)
RBC: 3.93 MIL/uL (ref 3.87–5.11)
WBC: 5.9 10*3/uL (ref 4.0–10.5)

## 2010-09-27 LAB — POCT I-STAT, CHEM 8
Hemoglobin: 12.6 g/dL (ref 12.0–15.0)
Sodium: 141 mEq/L (ref 135–145)
TCO2: 27 mmol/L (ref 0–100)

## 2010-09-27 LAB — DIFFERENTIAL
Basophils Relative: 1 % (ref 0–1)
Lymphs Abs: 1.6 10*3/uL (ref 0.7–4.0)
Monocytes Relative: 8 % (ref 3–12)
Neutro Abs: 3.7 10*3/uL (ref 1.7–7.7)
Neutrophils Relative %: 62 % (ref 43–77)

## 2010-09-27 LAB — URINE CULTURE
Colony Count: NO GROWTH
Culture: NO GROWTH

## 2010-11-06 NOTE — Assessment & Plan Note (Signed)
[facility]                             PULMONARY OFFICE NOTE   ZENOLA, DEZARN                     MRN:          [id number]  DATE:04/14/2007                            DOB:          [birth year]    HISTORY OF PRESENT ILLNESS:  The patient is an 77 year old African-  American female patient of Dr. Kriste Basque who has a known history of  asthmatic bronchitis, hypertension, osteoarthritis, and presents for an  acute office visit.  The patient complains over the last two weeks she  has had nasal congestion and productive cough with thick green sputum,  and general malaise.  The patient denies any fever, hemoptysis,  orthopnea, PND, leg swelling, recent travel, or antibiotic use.   The patient has a history of hypertension.  The patient reports that she  has been having difficulty for several months taking Cozaar with nausea.  The patient has switched it to bedtime per Dr. Jodelle Green recommendation,  however, continues to have nausea.  The patient has stopped her Cozaar  for the last two weeks and nausea has subsided.  The patient is  requesting an alternative medication.   PAST MEDICAL HISTORY:  Reviewed.   CURRENT MEDICATIONS:  Reviewed.   PHYSICAL EXAMINATION:  GENERAL:  The patient is a pleasant female in no  acute distress.  VITAL SIGNS:  She is afebrile with stable vital signs.  O2 saturations  95% on room air.  HEENT:  Unremarkable.  NECK:  Supple without cervical adenopathy.  No JVD.  LUNGS:  Coarse breath sounds without any wheezing or crackles.  HEART:  Regular rate and rhythm.  ABDOMEN:  Soft and nontender.  EXTREMITIES:  Warm without any edema.   IMPRESSION:  1. Acute asthmatic bronchitic exacerbation.  The patient is to begin      Omnicef x7 days, Mucinex DM twice a day, Tussionex #4 ounces 1/2 to      1 teaspoon every 12 hours as needed for cough.  The patient is      worse today.  Xopenex nebulizer treatment today was given in the   office.  The patient will return back with Dr. Kriste Basque in two weeks      or sooner if needed.  2. Hypertension.  Intolerance to Cozaar.  The patient was given      Benicar 20 mg three weeks' worth of samples.  The patient will      return back in two weeks with Dr. Kriste Basque for follow-up.      Rubye Oaks, NP  Electronically Signed      Lonzo Cloud. Kriste Basque, MD  Electronically Signed   TP/MedQ  DD: 04/14/2007  DT: 04/14/2007  Job #: [id number]

## 2011-01-10 ENCOUNTER — Encounter: Payer: Self-pay | Admitting: Pulmonary Disease

## 2011-01-10 ENCOUNTER — Ambulatory Visit (INDEPENDENT_AMBULATORY_CARE_PROVIDER_SITE_OTHER): Payer: Medicare Other | Admitting: Pulmonary Disease

## 2011-01-10 DIAGNOSIS — E7800 Pure hypercholesterolemia, unspecified: Secondary | ICD-10-CM

## 2011-01-10 DIAGNOSIS — K5730 Diverticulosis of large intestine without perforation or abscess without bleeding: Secondary | ICD-10-CM

## 2011-01-10 DIAGNOSIS — E559 Vitamin D deficiency, unspecified: Secondary | ICD-10-CM

## 2011-01-10 DIAGNOSIS — K589 Irritable bowel syndrome without diarrhea: Secondary | ICD-10-CM

## 2011-01-10 DIAGNOSIS — K219 Gastro-esophageal reflux disease without esophagitis: Secondary | ICD-10-CM

## 2011-01-10 DIAGNOSIS — I10 Essential (primary) hypertension: Secondary | ICD-10-CM

## 2011-01-10 DIAGNOSIS — J209 Acute bronchitis, unspecified: Secondary | ICD-10-CM

## 2011-01-10 DIAGNOSIS — M1990 Unspecified osteoarthritis, unspecified site: Secondary | ICD-10-CM

## 2011-01-10 DIAGNOSIS — M545 Low back pain: Secondary | ICD-10-CM

## 2011-01-10 DIAGNOSIS — I6350 Cerebral infarction due to unspecified occlusion or stenosis of unspecified cerebral artery: Secondary | ICD-10-CM

## 2011-01-10 DIAGNOSIS — D126 Benign neoplasm of colon, unspecified: Secondary | ICD-10-CM

## 2011-01-10 MED ORDER — AZITHROMYCIN 250 MG PO TABS: ORAL_TABLET | ORAL | Status: AC

## 2011-01-10 NOTE — Patient Instructions (Signed)
Today we updated your med list in EPIC...    Have your Pharm call us for any needed refills...    It is OK to take the Simvastatin in the AM daily...    We wrote a new prescription for a ZPAK to take as directed for infection...  Call for any questions...  Let's plan a follow up visit in 6 months & we will plan to check your f/u CXR & FASTING blood work at that time.Marland KitchenMarland Kitchen
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
## 2011-01-12 ENCOUNTER — Encounter: Payer: Self-pay | Admitting: Pulmonary Disease

## 2011-03-26 LAB — POCT URINALYSIS DIP (DEVICE)
Glucose, UA: NEGATIVE
Operator id: 29721
Specific Gravity, Urine: 1.01
Urobilinogen, UA: 0.2

## 2011-07-16 ENCOUNTER — Other Ambulatory Visit (INDEPENDENT_AMBULATORY_CARE_PROVIDER_SITE_OTHER): Payer: Medicare Other

## 2011-07-16 ENCOUNTER — Encounter: Payer: Self-pay | Admitting: Pulmonary Disease

## 2011-07-16 ENCOUNTER — Ambulatory Visit (INDEPENDENT_AMBULATORY_CARE_PROVIDER_SITE_OTHER): Payer: Medicare Other | Admitting: Pulmonary Disease

## 2011-07-16 DIAGNOSIS — M545 Low back pain, unspecified: Secondary | ICD-10-CM

## 2011-07-16 DIAGNOSIS — E559 Vitamin D deficiency, unspecified: Secondary | ICD-10-CM

## 2011-07-16 DIAGNOSIS — J209 Acute bronchitis, unspecified: Secondary | ICD-10-CM

## 2011-07-16 DIAGNOSIS — D126 Benign neoplasm of colon, unspecified: Secondary | ICD-10-CM

## 2011-07-16 DIAGNOSIS — F411 Generalized anxiety disorder: Secondary | ICD-10-CM

## 2011-07-16 DIAGNOSIS — K5730 Diverticulosis of large intestine without perforation or abscess without bleeding: Secondary | ICD-10-CM

## 2011-07-16 DIAGNOSIS — E7800 Pure hypercholesterolemia, unspecified: Secondary | ICD-10-CM

## 2011-07-16 DIAGNOSIS — K219 Gastro-esophageal reflux disease without esophagitis: Secondary | ICD-10-CM

## 2011-07-16 DIAGNOSIS — M1990 Unspecified osteoarthritis, unspecified site: Secondary | ICD-10-CM

## 2011-07-16 DIAGNOSIS — I10 Essential (primary) hypertension: Secondary | ICD-10-CM

## 2011-07-16 DIAGNOSIS — I6350 Cerebral infarction due to unspecified occlusion or stenosis of unspecified cerebral artery: Secondary | ICD-10-CM

## 2011-07-16 DIAGNOSIS — K589 Irritable bowel syndrome without diarrhea: Secondary | ICD-10-CM

## 2011-07-16 LAB — CBC WITH DIFFERENTIAL/PLATELET
Basophils Relative: 1.4 % (ref 0.0–3.0)
Eosinophils Absolute: 0.1 10*3/uL (ref 0.0–0.7)
Eosinophils Relative: 2.8 % (ref 0.0–5.0)
Hemoglobin: 13.2 g/dL (ref 12.0–15.0)
Lymphocytes Relative: 38.2 % (ref 12.0–46.0)
Monocytes Relative: 9.6 % (ref 3.0–12.0)
Neutro Abs: 2.2 10*3/uL (ref 1.4–7.7)
Neutrophils Relative %: 48 % (ref 43.0–77.0)
RBC: 4.28 Mil/uL (ref 3.87–5.11)
WBC: 4.6 10*3/uL (ref 4.5–10.5)

## 2011-07-16 LAB — BASIC METABOLIC PANEL
BUN: 13 mg/dL (ref 6–23)
CO2: 31 mEq/L (ref 19–32)
Calcium: 9.8 mg/dL (ref 8.4–10.5)
Chloride: 107 mEq/L (ref 96–112)
Creatinine, Ser: 0.7 mg/dL (ref 0.4–1.2)
Glucose, Bld: 91 mg/dL (ref 70–99)

## 2011-07-16 LAB — URINALYSIS
Bilirubin Urine: NEGATIVE
Leukocytes, UA: NEGATIVE
Nitrite: NEGATIVE
Total Protein, Urine: NEGATIVE
pH: 7 (ref 5.0–8.0)

## 2011-07-16 LAB — HEPATIC FUNCTION PANEL
ALT: 14 U/L (ref 0–35)
Albumin: 3.7 g/dL (ref 3.5–5.2)
Bilirubin, Direct: 0.1 mg/dL (ref 0.0–0.3)
Total Protein: 7.2 g/dL (ref 6.0–8.3)

## 2011-07-16 LAB — LIPID PANEL
Cholesterol: 223 mg/dL — ABNORMAL HIGH (ref 0–200)
Total CHOL/HDL Ratio: 5
Triglycerides: 95 mg/dL (ref 0.0–149.0)

## 2011-07-16 LAB — LDL CHOLESTEROL, DIRECT: Direct LDL: 151.6 mg/dL

## 2011-07-16 MED ORDER — ALPRAZOLAM 0.25 MG PO TABS: ORAL_TABLET | ORAL | Status: DC

## 2011-07-16 MED ORDER — LIDOCAINE 5 % EX PTCH: 1.0000 | MEDICATED_PATCH | Freq: Two times a day (BID) | CUTANEOUS | Status: DC

## 2011-07-16 MED ORDER — SIMVASTATIN 40 MG PO TABS: 40.0000 mg | ORAL_TABLET | Freq: Every day | ORAL | Status: DC
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
## 2011-07-16 NOTE — Patient Instructions (Signed)
Today we updated your med list in our EPIC system...    Continue your current medications the same...    We refilled the meds you requested...  For your rash & dry skin> you need a good moisturizing cream OTC or Lachydrin/ Dermalac...  For the pain in your left side> try the LIDODERM patches> apply one patch 12H on then 12H off...  Today we did your follow up fasting blood work...    Please call the PHONE TREE in a few days for your results...    Dial [id number] & when prompted enter your patient number followed by the # symbol...    Your patient number is:  [id number]  Call for any questions...  Let's plan a follow up visit in 6 months.Marland KitchenMarland Kitchen

## 2011-07-17 LAB — VITAMIN D 25 HYDROXY (VIT D DEFICIENCY, FRACTURES): Vit D, 25-Hydroxy: 38 ng/mL (ref 30–89)

## 2011-07-23 ENCOUNTER — Encounter: Payer: Self-pay | Admitting: Pulmonary Disease

## 2011-07-31 ENCOUNTER — Other Ambulatory Visit: Payer: Self-pay | Admitting: Allergy

## 2011-07-31 MED ORDER — HYDROCHLOROTHIAZIDE 25 MG PO TABS: 25.0000 mg | ORAL_TABLET | Freq: Every day | ORAL | Status: DC

## 2011-07-31 MED ORDER — LOSARTAN POTASSIUM 50 MG PO TABS: 50.0000 mg | ORAL_TABLET | Freq: Every day | ORAL | Status: DC

## 2011-08-02 ENCOUNTER — Other Ambulatory Visit: Payer: Self-pay | Admitting: *Deleted

## 2011-08-02 MED ORDER — OMEPRAZOLE 20 MG PO CPDR: 20.0000 mg | DELAYED_RELEASE_CAPSULE | Freq: Every day | ORAL | Status: DC

## 2011-08-09 ENCOUNTER — Telehealth: Payer: Self-pay | Admitting: Pulmonary Disease

## 2011-08-09 NOTE — Telephone Encounter (Signed)
ATC pt line busy x 3 wcb 

## 2011-08-12 NOTE — Telephone Encounter (Signed)
lmomtcb x1 for pt 

## 2011-08-12 NOTE — Telephone Encounter (Signed)
Notes Recorded by Michele Mcalpine, MD on 07/23/2011 at 5:53 AM Labs reviewed & phone tree recorded... SMN FLP not at goal off her Simva & rec to restart... Chem, CBC, TSH, etc all look ok...   I spoke with patient about results and she verbalized understanding and had no questions. She stated she had already re ordered her simvastatin and will start it once she gets it in. Pt needed nothing further

## 2011-08-12 NOTE — Telephone Encounter (Signed)
ATC PT NA WCB 

## 2011-09-13 ENCOUNTER — Telehealth: Payer: Self-pay | Admitting: Pulmonary Disease

## 2011-09-13 DIAGNOSIS — R21 Rash and other nonspecific skin eruption: Secondary | ICD-10-CM

## 2011-09-13 NOTE — Telephone Encounter (Signed)
Called and spoke with pt and she stated that she has a rash---all over, in her hair, arms, legs, back and with red splotches--she stated that it does itch some but not very bad.  Pt is requesting appt with a skin doctor.  SN please advise.  thanks

## 2011-09-13 NOTE — Telephone Encounter (Signed)
Per SN--needs appt with dermatology asap.  Order has already been placed.

## 2011-10-01 ENCOUNTER — Telehealth: Payer: Self-pay | Admitting: Pulmonary Disease

## 2011-10-01 NOTE — Telephone Encounter (Signed)
Called and spoke with pt and she stated that Dr. Terri Piedra told her that her problem was coming from one of her BP meds and the pt has stopped this med on her own.  She could not remember the name of these either.  She stated that she needs something to take for her BP management. Called and got the OV note and pathology report from Dr. Dorita Sciara office.  Will give to SN to review and will call pt with recs from SN about her BP meds.

## 2011-10-02 MED ORDER — ATENOLOL 25 MG PO TABS: 25.0000 mg | ORAL_TABLET | Freq: Every day | ORAL | Status: DC

## 2011-10-02 NOTE — Telephone Encounter (Signed)
Per SN---stop the hctz, mobic per Dr. Prudence Davidson   Take the losartan 50mg   1 daily and add atenolol 25mg   1 daily.  thanks

## 2011-10-02 NOTE — Telephone Encounter (Signed)
Called spoke with patient, advised of SN's recs regarding her BP meds.  Pt verbalized her understanding.  Script for new med atenolol 25mg  1 tab daily sent to verified pharmacy.  Pt aware to call back if needed.  Med list updated.

## 2011-10-02 NOTE — Telephone Encounter (Signed)
lmomtcb x1 

## 2011-10-02 NOTE — Telephone Encounter (Signed)
Per TP---pt will need to give [place] the names of the meds that she has stopped so SN can address her issues.  thanks

## 2011-10-02 NOTE — Telephone Encounter (Signed)
Pt returned call.  She stated that Dr Terri Piedra stated that her rash could have been caused by her hctz 15mg  1/2 tab qd or her mobic 7.5mg  qd.  Because pt was concerned about this, she decided to stop her losartan 50mg  as well.  Has only been taking a asa 81mg  qd and fish oil.  Pt has been off of these meds since 3.25.13.  Pt does report increased SOB x2 weeks and HA x4-5days.  Dr Kriste Basque please advise, thanks.  Last ov 1.23.13, upcoming 7.23.13.

## 2011-12-03 ENCOUNTER — Ambulatory Visit (INDEPENDENT_AMBULATORY_CARE_PROVIDER_SITE_OTHER): Payer: Medicare Other | Admitting: Adult Health

## 2011-12-03 ENCOUNTER — Encounter: Payer: Self-pay | Admitting: Adult Health

## 2011-12-03 ENCOUNTER — Other Ambulatory Visit (INDEPENDENT_AMBULATORY_CARE_PROVIDER_SITE_OTHER): Payer: Medicare Other

## 2011-12-03 VITALS — BP 140/90 | HR 62 | Temp 97.7°F | Ht 63.0 in | Wt 152.2 lb

## 2011-12-03 DIAGNOSIS — L309 Dermatitis, unspecified: Secondary | ICD-10-CM | POA: Insufficient documentation

## 2011-12-03 DIAGNOSIS — I10 Essential (primary) hypertension: Secondary | ICD-10-CM

## 2011-12-03 DIAGNOSIS — L259 Unspecified contact dermatitis, unspecified cause: Secondary | ICD-10-CM

## 2011-12-03 LAB — BASIC METABOLIC PANEL
Calcium: 9.1 mg/dL (ref 8.4–10.5)
GFR: 84.19 mL/min (ref >60.00)
Potassium: 4.5 mEq/L (ref 3.5–5.1)
Sodium: 146 mEq/L — ABNORMAL HIGH (ref 135–145)

## 2011-12-03 LAB — CBC WITH DIFFERENTIAL/PLATELET
Eosinophils Absolute: 0.1 10*3/uL (ref 0.0–0.7)
Lymphs Abs: 1.6 10*3/uL (ref 0.7–4.0)
MCHC: 32.4 g/dL (ref 30.0–36.0)
MCV: 93.2 fl (ref 78.0–100.0)
Monocytes Absolute: 0.4 10*3/uL (ref 0.1–1.0)
Neutrophils Relative %: 50.2 % (ref 43.0–77.0)
Platelets: 230 10*3/uL (ref 150.0–400.0)
RDW: 13.8 % (ref 11.5–14.6)

## 2011-12-03 MED ORDER — ALPRAZOLAM 0.25 MG PO TABS: ORAL_TABLET | ORAL | Status: DC

## 2011-12-03 NOTE — Patient Instructions (Signed)
I will call with lab results.  Follow up with Dr. Terri Piedra in dermatology  Use sensitive soaps and lotion and detergents.  May use Xanax 0.25mg  Twice daily  As needed  Anxiety  Please contact office for sooner follow up if symptoms do not improve or worsen or seek emergency care
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
## 2011-12-03 NOTE — Assessment & Plan Note (Addendum)
?  allergic Dermatitis vs dry skin vs med side effects  Labs pending. No visible rash/lesions noted on exam.  Labs unrevealing on previous w/up 6 months ago.  Advised to follow up with Dr. Terri Piedra in next couple of weeks.   Plan:  May use Zyrtec 5mg  At bedtime  As needed   Advised to use only hypoallergenic soaps, detergents and lotions  Please contact office for sooner follow up if symptoms do not improve or worsen or seek emergency care

## 2011-12-04 NOTE — Progress Notes (Signed)
Quick Note:  Pt aware of results. ______ 

## 2012-01-14 ENCOUNTER — Ambulatory Visit (INDEPENDENT_AMBULATORY_CARE_PROVIDER_SITE_OTHER): Payer: Medicare Other | Admitting: Pulmonary Disease

## 2012-01-14 ENCOUNTER — Encounter: Payer: Self-pay | Admitting: Pulmonary Disease

## 2012-01-14 VITALS — BP 142/90 | HR 56 | Temp 97.1°F | Ht 63.0 in | Wt 144.6 lb

## 2012-01-14 DIAGNOSIS — K589 Irritable bowel syndrome without diarrhea: Secondary | ICD-10-CM

## 2012-01-14 DIAGNOSIS — I10 Essential (primary) hypertension: Secondary | ICD-10-CM

## 2012-01-14 DIAGNOSIS — D126 Benign neoplasm of colon, unspecified: Secondary | ICD-10-CM

## 2012-01-14 DIAGNOSIS — L309 Dermatitis, unspecified: Secondary | ICD-10-CM

## 2012-01-14 DIAGNOSIS — E7800 Pure hypercholesterolemia, unspecified: Secondary | ICD-10-CM

## 2012-01-14 DIAGNOSIS — L259 Unspecified contact dermatitis, unspecified cause: Secondary | ICD-10-CM

## 2012-01-14 DIAGNOSIS — M545 Low back pain, unspecified: Secondary | ICD-10-CM

## 2012-01-14 DIAGNOSIS — F411 Generalized anxiety disorder: Secondary | ICD-10-CM

## 2012-01-14 DIAGNOSIS — K5730 Diverticulosis of large intestine without perforation or abscess without bleeding: Secondary | ICD-10-CM

## 2012-01-14 DIAGNOSIS — I6350 Cerebral infarction due to unspecified occlusion or stenosis of unspecified cerebral artery: Secondary | ICD-10-CM

## 2012-01-14 DIAGNOSIS — K219 Gastro-esophageal reflux disease without esophagitis: Secondary | ICD-10-CM

## 2012-01-14 DIAGNOSIS — J209 Acute bronchitis, unspecified: Secondary | ICD-10-CM

## 2012-01-14 DIAGNOSIS — M1990 Unspecified osteoarthritis, unspecified site: Secondary | ICD-10-CM

## 2012-01-14 NOTE — Patient Instructions (Addendum)
Today we updated your med list in our EPIC system...    Continue your current medications the same...  In April DrLupton asked that you STOP the HCTZ and Mobic...  Your blood pressure meds should be Cozaar (Losartan) & Tenormin (Atenolol)...  We will arrange for a Derrmatology second opinion at your request...  Call for any questions...  Let's plan a routine follow up visit in 6 months, call sooner if needed for any problems.Marland KitchenMarland Kitchen
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
## 2012-01-21 ENCOUNTER — Encounter: Payer: Self-pay | Admitting: *Deleted

## 2012-01-21 ENCOUNTER — Telehealth: Payer: Self-pay | Admitting: Pulmonary Disease

## 2012-01-21 NOTE — Telephone Encounter (Signed)
Letter received from pts daughter  Kathryn Whitaker bert  [phone]  Is her home number.   Letter has been faxed to our office by the pts daughter requesting that a letter be sent in for the pt to have mail delivered to her door due to medical and age related issues.  Letter has been completed and signed by SN and faxed to jill baker at [phone].  This letter from pts daughter has been sent to be scanned into the pts chart.

## 2012-02-04 ENCOUNTER — Telehealth: Payer: Self-pay | Admitting: Pulmonary Disease

## 2012-02-04 MED ORDER — LIDOCAINE 5 % EX PTCH: 1.0000 | MEDICATED_PATCH | Freq: Two times a day (BID) | CUTANEOUS | Status: DC

## 2012-02-04 NOTE — Telephone Encounter (Signed)
Called, spoke with pt who states she lost the lidoderm patch that was given to her in the past.  She is requesting we call this into [facility].  Looks like rx was last given on 07/16/11 # 30 x 5.  Last OV 01/14/12 and asked to f/u in 6 months.  Pls advise if rx ok. Thank you.

## 2012-02-04 NOTE — Telephone Encounter (Signed)
Note:  Pt requesting this be called in soon as she is having pain in ribs (states this is the reason SN ordered patch for her).

## 2012-02-04 NOTE — Telephone Encounter (Signed)
rx for the lidoderm patches have been sent to the pharmacy---called and spoke with pt and she is aware of this.  Nothing further needed.

## 2012-02-26 ENCOUNTER — Other Ambulatory Visit: Payer: Self-pay | Admitting: Pulmonary Disease

## 2012-03-01 ENCOUNTER — Emergency Department (HOSPITAL_COMMUNITY): Payer: Medicare Other

## 2012-03-01 ENCOUNTER — Encounter (HOSPITAL_COMMUNITY): Payer: Self-pay | Admitting: Emergency Medicine

## 2012-03-01 ENCOUNTER — Emergency Department (HOSPITAL_COMMUNITY)
Admission: EM | Admit: 2012-03-01 | Discharge: 2012-03-01 | Disposition: A | Discharge status: Home / Self Care | Payer: Medicare Other | Attending: Emergency Medicine | Admitting: Emergency Medicine

## 2012-03-01 DIAGNOSIS — R0602 Shortness of breath: Secondary | ICD-10-CM | POA: Insufficient documentation

## 2012-03-01 DIAGNOSIS — L509 Urticaria, unspecified: Secondary | ICD-10-CM | POA: Insufficient documentation

## 2012-03-01 DIAGNOSIS — R21 Rash and other nonspecific skin eruption: Secondary | ICD-10-CM | POA: Insufficient documentation

## 2012-03-01 DIAGNOSIS — K219 Gastro-esophageal reflux disease without esophagitis: Secondary | ICD-10-CM | POA: Insufficient documentation

## 2012-03-01 DIAGNOSIS — Z79899 Other long term (current) drug therapy: Secondary | ICD-10-CM | POA: Insufficient documentation

## 2012-03-01 DIAGNOSIS — E7800 Pure hypercholesterolemia, unspecified: Secondary | ICD-10-CM | POA: Insufficient documentation

## 2012-03-01 DIAGNOSIS — R5381 Other malaise: Secondary | ICD-10-CM | POA: Insufficient documentation

## 2012-03-01 DIAGNOSIS — I10 Essential (primary) hypertension: Secondary | ICD-10-CM | POA: Insufficient documentation

## 2012-03-01 LAB — CBC WITH DIFFERENTIAL/PLATELET
Basophils Absolute: 0 10*3/uL (ref 0.0–0.1)
Basophils Relative: 0 % (ref 0–1)
MCHC: 33.7 g/dL (ref 30.0–36.0)
Neutro Abs: 4.1 10*3/uL (ref 1.7–7.7)
Neutrophils Relative %: 69 % (ref 43–77)
Platelets: 254 10*3/uL (ref 150–400)
RDW: 13 % (ref 11.5–15.5)

## 2012-03-01 LAB — BASIC METABOLIC PANEL
BUN: 20 mg/dL (ref 6–23)
GFR calc Af Amer: 87 mL/min — ABNORMAL LOW (ref >90)
GFR calc non Af Amer: 75 mL/min — ABNORMAL LOW (ref >90)
Potassium: 4.9 mEq/L (ref 3.5–5.1)
Sodium: 138 mEq/L (ref 135–145)

## 2012-03-01 LAB — URINALYSIS, ROUTINE W REFLEX MICROSCOPIC
Nitrite: NEGATIVE
Specific Gravity, Urine: 1.014 (ref 1.005–1.030)
Urobilinogen, UA: 1 mg/dL (ref 0.0–1.0)

## 2012-03-01 MED ORDER — PREDNISONE 20 MG PO TABS
40.0000 mg | ORAL_TABLET | Freq: Once | ORAL | Status: AC
  Administered 2012-03-01: 40 mg via ORAL
  Filled 2012-03-01: qty 2

## 2012-03-01 MED ORDER — PREDNISONE 20 MG PO TABS: 40.0000 mg | ORAL_TABLET | Freq: Every day | ORAL | Status: AC

## 2012-03-01 NOTE — ED Notes (Signed)
Pt placed on cardiac monitor 

## 2012-03-01 NOTE — ED Provider Notes (Signed)
History     CSN: [id number]  Arrival date & time 03/01/12  0714   First MD Initiated Contact with Patient 03/01/12 [phone]      Chief Complaint  Patient presents with  . Urticaria  . Weakness  . Shortness of Breath    (Consider location/radiation/quality/duration/timing/severity/associated sxs/prior treatment) Patient is a 78 y.o. female presenting with urticaria, weakness, and shortness of breath. The history is provided by the patient.  Urticaria This is a new problem. Associated symptoms include shortness of breath. Pertinent negatives include no chest pain and no abdominal pain.  Weakness Primary symptoms do not include fever.  Additional symptoms include weakness. Additional symptoms do not include neck stiffness.  Shortness of Breath  Associated symptoms include shortness of breath. Pertinent negatives include no chest pain, no fever and no cough.   patient woke with itchy painful rash over various parts of her body this morning. She states she is sensitive skin, but has not had a rash at this before. States she has a large area on her abdomen and right chest. No fevers. She has been having some difficulty breathing both over the last few days and now today. No wheezing. He does have a history of asthma and states she had exacerbation Wednesday. No chest pain or abdominal pain. She states she's felt fatigued and has been losing weight. No dysuria. She states she uses Dreft and may be allergic to chlorine. She states there's been no change in her soaps or detergents.  Past Medical History  Diagnosis Date  . Allergic rhinitis   . Acute asthmatic bronchitis   . Hypertension   . Hypercholesteremia   . GERD (gastroesophageal reflux disease)   . Diverticulosis of colon   . IBS (irritable bowel syndrome)   . History of colonic polyps   . UTI (lower urinary tract infection)   . DJD (degenerative joint disease)   . Lumbar back pain   . Lacunar infarction   . Vitamin d deficiency     . Anxiety     Past Surgical History  Procedure Date  . Abdominal hysterectomy     History reviewed. No pertinent family history.  History  Substance Use Topics  . Smoking status: Never Smoker   . Smokeless tobacco: Not on file  . Alcohol Use: No    OB History    Grav Para Term Preterm Abortions TAB SAB Ect Mult Living                  Review of Systems  Constitutional: Positive for fatigue. Negative for fever and appetite change.  HENT: Negative for neck stiffness and ear discharge.   Respiratory: Positive for shortness of breath. Negative for cough and chest tightness.   Cardiovascular: Negative for chest pain.  Gastrointestinal: Negative for abdominal pain and abdominal distention.  Genitourinary: Negative for frequency and hematuria.  Skin: Positive for rash. Negative for color change and pallor.  Neurological: Positive for weakness.  Hematological: Negative for adenopathy.  Psychiatric/Behavioral: Negative for behavioral problems.  All other systems reviewed and are negative.    Allergies  Telithromycin  Home Medications   Current Outpatient Rx  Name Route Sig Dispense Refill  . ALBUTEROL SULFATE HFA 108 (90 BASE) MCG/ACT IN AERS Inhalation Inhale 2 puffs into the lungs every 6 (six) hours as needed. For shortness of breath    . ALPRAZOLAM 0.25 MG PO TABS Oral Take 0.25 mg by mouth 3 (three) times daily as needed. for anxiety    .  ASPIRIN 81 MG PO TABS Oral Take 81 mg by mouth daily.     . ATENOLOL 25 MG PO TABS Oral Take 25 mg by mouth daily.    . BUDESONIDE-FORMOTEROL FUMARATE 80-4.5 MCG/ACT IN AERO Inhalation Inhale 2 puffs into the lungs 2 (two) times daily as needed. For shortness of breath    . CALCIUM CARBONATE-VITAMIN D 600-400 MG-UNIT PO TABS Oral Take 1 tablet by mouth daily. For bone health    . VITAMIN D 1000 UNITS PO CAPS Oral Take 1,000 Units by mouth daily.      . COD LIVER OIL 1000 MG PO CAPS Oral Take 1 capsule by mouth daily.      . OMEGA-3  FATTY ACIDS 1000 MG PO CAPS Oral Take 2 g by mouth daily.     Marland Kitchen LIDOCAINE 5 % EX PTCH Transdermal Place 1 patch onto the skin every 12 (twelve) hours. Remove & Discard patch within 12 hours or as directed by MD Patient never filled Rx, patient states lost prescription    . MULTIVITAMINS PO CAPS Oral Take 1 capsule by mouth daily.     Marland Kitchen OMEPRAZOLE 20 MG PO CPDR Oral Take 20 mg by mouth daily. TAKE 30 MINUTES BEFORE FIRST MEAL OF THE DAY    . MUCINEX FAST-MAX COLD & SINUS PO Oral Take 1 tablet by mouth 2 (two) times daily. With plenty of fluids    . POLYETHYLENE GLYCOL 3350 PO PACK Oral Take 17 g by mouth daily as needed. For constipation    . SENNOSIDES-DOCUSATE SODIUM 8.6-50 MG PO TABS Oral Take 1 tablet by mouth daily as needed. For constipation    . SIMVASTATIN 40 MG PO TABS Oral Take 40 mg by mouth daily.    Marland Kitchen PREDNISONE 20 MG PO TABS Oral Take 2 tablets (40 mg total) by mouth daily. 4 tablet 0    BP 162/85  Pulse 63  Temp 98.5 F (36.9 C) (Oral)  Resp 16  SpO2 98%  Physical Exam  Constitutional: She is oriented to person, place, and time. She appears well-developed.  HENT:  Head: Normocephalic.  Eyes: Pupils are equal, round, and reactive to light.  Neck: Normal range of motion.  Cardiovascular: Normal rate and regular rhythm.   Pulmonary/Chest: Effort normal and breath sounds normal. She has no wheezes.  Abdominal: There is no tenderness.  Musculoskeletal: Normal range of motion. She exhibits no tenderness.  Neurological: She is alert and oriented to person, place, and time.  Skin: Rash noted.       Patient has apparent hives to bilateral extremities and chest abdomen and back. The largest area is approximately 6 x 8 cm on the right flank area. There is another one along the anterior line of her underwear elastic. Her other various areas. There are red indurated raised areas. No fluctuance.    ED Course  Procedures (including critical care time)  Labs Reviewed  BASIC  METABOLIC PANEL - Abnormal; Notable for the following:    Glucose, Bld 102 (*)     GFR calc non Af Amer 75 (*)     GFR calc Af Amer 87 (*)     All other components within normal limits  CBC WITH DIFFERENTIAL  URINALYSIS, ROUTINE W REFLEX MICROSCOPIC  TROPONIN I   Dg Chest 2 View  03/01/2012  *RADIOLOGY REPORT*  Clinical Data: Shortness of breath.  CHEST - 2 VIEW  Comparison: Chest x-ray 04/09/2010.  Findings: Lung volumes are normal.  No consolidative airspace disease.  No pleural effusions.  Pulmonary vasculature is normal. Heart size is normal.  Mediastinal contours are remarkable for some tortuosity and atherosclerosis of the thoracic aorta.  IMPRESSION: 1.  No radiographic evidence of acute cardiopulmonary disease. 2.  Aortic atherosclerosis.   Original Report Authenticated By: Florencia Reasons, M.D.      1. Rash     Date: 03/01/2012  Rate: 54  Rhythm: sinus bradycardia  QRS Axis: left  Intervals: normal  ST/T Wave abnormalities: normal  Conduction Disutrbances:none  Narrative Interpretation:   Old EKG Reviewed: unchanged     MDM  Patient with rash. She's a history of being somewhat allergic does not have a rash at this before. No clear cause. Lungs are clear. No wheezing. Posterior pharynx is normal. Patient is also a generalized weakness. No clear cause. Patient be discharged home with steroids.        Juliet Rude. Rubin Payor, MD 03/01/12 [phone]

## 2012-03-01 NOTE — ED Notes (Signed)
C/o hives/itching on trunk- started this morning, and short of breath when getting ready for church, able to speak in complete sentences, no resp distress noted

## 2012-03-01 NOTE — ED Notes (Signed)
Pt alert and oriented x4. Respirations even and unlabored, bilateral symmetrical rise and fall of chest. Skin warm and dry. In no acute distress. Denies needs.   

## 2012-04-23 ENCOUNTER — Emergency Department (HOSPITAL_COMMUNITY)
Admission: EM | Admit: 2012-04-23 | Discharge: 2012-04-23 | Disposition: A | Discharge status: Home / Self Care | Payer: Medicare Other | Attending: Emergency Medicine | Admitting: Emergency Medicine

## 2012-04-23 ENCOUNTER — Telehealth: Payer: Self-pay | Admitting: Pulmonary Disease

## 2012-04-23 ENCOUNTER — Encounter (HOSPITAL_COMMUNITY): Payer: Self-pay | Admitting: *Deleted

## 2012-04-23 DIAGNOSIS — M1990 Unspecified osteoarthritis, unspecified site: Secondary | ICD-10-CM | POA: Insufficient documentation

## 2012-04-23 DIAGNOSIS — K589 Irritable bowel syndrome without diarrhea: Secondary | ICD-10-CM | POA: Insufficient documentation

## 2012-04-23 DIAGNOSIS — Z79899 Other long term (current) drug therapy: Secondary | ICD-10-CM | POA: Insufficient documentation

## 2012-04-23 DIAGNOSIS — Z8719 Personal history of other diseases of the digestive system: Secondary | ICD-10-CM | POA: Insufficient documentation

## 2012-04-23 DIAGNOSIS — E559 Vitamin D deficiency, unspecified: Secondary | ICD-10-CM | POA: Insufficient documentation

## 2012-04-23 DIAGNOSIS — K219 Gastro-esophageal reflux disease without esophagitis: Secondary | ICD-10-CM | POA: Insufficient documentation

## 2012-04-23 DIAGNOSIS — J45909 Unspecified asthma, uncomplicated: Secondary | ICD-10-CM | POA: Insufficient documentation

## 2012-04-23 DIAGNOSIS — E7800 Pure hypercholesterolemia, unspecified: Secondary | ICD-10-CM | POA: Insufficient documentation

## 2012-04-23 DIAGNOSIS — R55 Syncope and collapse: Secondary | ICD-10-CM | POA: Insufficient documentation

## 2012-04-23 DIAGNOSIS — Z860100 Personal history of colon polyps, unspecified: Secondary | ICD-10-CM | POA: Insufficient documentation

## 2012-04-23 DIAGNOSIS — I10 Essential (primary) hypertension: Secondary | ICD-10-CM | POA: Insufficient documentation

## 2012-04-23 DIAGNOSIS — Z87440 Personal history of urinary (tract) infections: Secondary | ICD-10-CM | POA: Insufficient documentation

## 2012-04-23 DIAGNOSIS — E869 Volume depletion, unspecified: Secondary | ICD-10-CM | POA: Insufficient documentation

## 2012-04-23 LAB — COMPREHENSIVE METABOLIC PANEL
ALT: 13 U/L (ref 0–35)
AST: 23 U/L (ref 0–37)
Albumin: 3.6 g/dL (ref 3.5–5.2)
Alkaline Phosphatase: 69 U/L (ref 39–117)
Glucose, Bld: 95 mg/dL (ref 70–99)
Potassium: 4.9 mEq/L (ref 3.5–5.1)
Sodium: 135 mEq/L (ref 135–145)
Total Protein: 7.2 g/dL (ref 6.0–8.3)

## 2012-04-23 LAB — GLUCOSE, CAPILLARY

## 2012-04-23 LAB — CBC WITH DIFFERENTIAL/PLATELET
Basophils Absolute: 0 10*3/uL (ref 0.0–0.1)
Basophils Relative: 1 % (ref 0–1)
Eosinophils Absolute: 0.1 10*3/uL (ref 0.0–0.7)
Lymphs Abs: 1.8 10*3/uL (ref 0.7–4.0)
MCH: 29.9 pg (ref 26.0–34.0)
Neutrophils Relative %: 53 % (ref 43–77)
Platelets: 268 10*3/uL (ref 150–400)
RBC: 4.21 MIL/uL (ref 3.87–5.11)
RDW: 12.7 % (ref 11.5–15.5)
WBC: 4.8 10*3/uL (ref 4.0–10.5)

## 2012-04-23 LAB — URINE MICROSCOPIC-ADD ON

## 2012-04-23 LAB — URINALYSIS, ROUTINE W REFLEX MICROSCOPIC
Glucose, UA: NEGATIVE mg/dL
Hgb urine dipstick: NEGATIVE
Specific Gravity, Urine: 1.009 (ref 1.005–1.030)
Urobilinogen, UA: 1 mg/dL (ref 0.0–1.0)
pH: 7.5 (ref 5.0–8.0)

## 2012-04-23 MED ORDER — SODIUM CHLORIDE 0.9 % IV BOLUS (SEPSIS)
500.0000 mL | Freq: Once | INTRAVENOUS | Status: AC
  Administered 2012-04-23: 500 mL via INTRAVENOUS

## 2012-04-23 MED ORDER — SODIUM CHLORIDE 0.9 % IV SOLN: INTRAVENOUS | Status: DC

## 2012-04-23 NOTE — ED Notes (Signed)
Pt also c/o feeling very weak since the episodes.

## 2012-04-23 NOTE — ED Provider Notes (Cosign Needed)
History     CSN: [id number]  Arrival date & time 04/23/12  1301   First MD Initiated Contact with Patient 04/23/12 1324      Chief Complaint  Patient presents with  . Dizziness  . Near Syncope    (Consider location/radiation/quality/duration/timing/severity/associated sxs/prior treatment) HPI Comments: Kathryn Whitaker is a 78 y.o. Female who was shopping today, when she felt hot and dizzy. She is able sit down, and it got better. She then drove home. She had a second episode at home, in a similar nature. She came to the emergency room for evaluation, near syncope. She had mild, shortness of breath, with a feeling of near-syncope. At rest s has no shortness of breath, and does not feel weak. There are no other aggravating or palliative factors.  The history is provided by the patient.    Past Medical History  Diagnosis Date  . Allergic rhinitis   . Acute asthmatic bronchitis   . Hypertension   . Hypercholesteremia   . GERD (gastroesophageal reflux disease)   . Diverticulosis of colon   . IBS (irritable bowel syndrome)   . History of colonic polyps   . UTI (lower urinary tract infection)   . DJD (degenerative joint disease)   . Lumbar back pain   . Lacunar infarction   . Vitamin D deficiency   . Anxiety     Past Surgical History  Procedure Date  . Abdominal hysterectomy     No family history on file.  History  Substance Use Topics  . Smoking status: Never Smoker   . Smokeless tobacco: Not on file  . Alcohol Use: No    OB History    Grav Para Term Preterm Abortions TAB SAB Ect Mult Living                  Review of Systems  All other systems reviewed and are negative.    Allergies  Telithromycin  Home Medications   Current Outpatient Rx  Name Route Sig Dispense Refill  . ALBUTEROL SULFATE HFA 108 (90 BASE) MCG/ACT IN AERS Inhalation Inhale 2 puffs into the lungs every 6 (six) hours as needed. For shortness of breath    . ALPRAZOLAM 0.25 MG PO  TABS Oral Take 0.25 mg by mouth 3 (three) times daily as needed. for anxiety    . ASPIRIN EC 81 MG PO TBEC Oral Take 81 mg by mouth daily.    . ATENOLOL 25 MG PO TABS Oral Take 25 mg by mouth daily.    . BUDESONIDE-FORMOTEROL FUMARATE 80-4.5 MCG/ACT IN AERO Inhalation Inhale 2 puffs into the lungs 2 (two) times daily as needed. For shortness of breath    . CALCIUM CARBONATE-VITAMIN D 600-400 MG-UNIT PO TABS Oral Take 1 tablet by mouth daily. For bone health    . VITAMIN D 1000 UNITS PO CAPS Oral Take 1,000 Units by mouth daily.      . OMEGA-3 FATTY ACIDS 1000 MG PO CAPS Oral Take 1 g by mouth 2 (two) times daily.     Marland Kitchen LIDOCAINE 5 % EX PTCH Transdermal Place 1 patch onto the skin every 12 (twelve) hours. Remove & Discard patch within 12 hours or as directed by MD Patient never filled Rx, patient states lost prescription    . ADULT MULTIVITAMIN W/MINERALS CH Oral Take 1 tablet by mouth daily.    Marland Kitchen OMEPRAZOLE 20 MG PO CPDR Oral Take 20 mg by mouth daily.     Marland Kitchen POLYETHYLENE GLYCOL  3350 PO PACK Oral Take 17 g by mouth daily as needed. For constipation    . SENNOSIDES-DOCUSATE SODIUM 8.6-50 MG PO TABS Oral Take 1 tablet by mouth daily as needed. For constipation    . SIMVASTATIN 40 MG PO TABS Oral Take 40 mg by mouth daily.      BP 150/89  Pulse 72  Temp 97.7 F (36.5 C) (Oral)  Resp 18  SpO2 100%  Physical Exam  Nursing note and vitals reviewed. Constitutional: She is oriented to person, place, and time. She appears well-developed and well-nourished.       She is frail  HENT:  Head: Normocephalic and atraumatic.  Eyes: Conjunctivae normal and EOM are normal. Pupils are equal, round, and reactive to light.  Neck: Normal range of motion and phonation normal. Neck supple.  Cardiovascular: Normal rate, regular rhythm and intact distal pulses.   Pulmonary/Chest: Effort normal and breath sounds normal. She exhibits no tenderness.  Abdominal: Soft. She exhibits no distension. There is no  tenderness. There is no guarding.  Musculoskeletal: Normal range of motion.  Neurological: She is alert and oriented to person, place, and time. She has normal strength. She exhibits normal muscle tone.  Skin: Skin is warm and dry.  Psychiatric: She has a normal mood and affect. Her behavior is normal. Judgment and thought content normal.    ED Course  Procedures (including critical care time)   .  Date: 04/10/2012  Rate: 72  Rhythm: normal sinus rhythm  QRS Axis: normal  PR and QT Intervals: normal  ST/T Wave abnormalities: nonspecific ST changes  PR and QRS Conduction Disutrbances:none  Narrative Interpretation:   Old EKG Reviewed: unchanged     Labs Reviewed  COMPREHENSIVE METABOLIC PANEL - Abnormal; Notable for the following:    GFR calc non Af Amer 75 (*)     GFR calc Af Amer 87 (*)     All other components within normal limits  URINALYSIS, ROUTINE W REFLEX MICROSCOPIC - Abnormal; Notable for the following:    Leukocytes, UA TRACE (*)     All other components within normal limits  URINE MICROSCOPIC-ADD ON - Abnormal; Notable for the following:    Squamous Epithelial / LPF FEW (*)     Bacteria, UA FEW (*)     All other components within normal limits  CBC WITH DIFFERENTIAL  TROPONIN I  GLUCOSE, CAPILLARY  URINE CULTURE   No results found.   1. Volume depletion       MDM  Near-syncope, no clear cause. Doubt UTI, metabolic instability, or acute CNS process. EKG normal. She is able to eat and ambulate  in emergency department       Flint Melter, MD 04/24/12 [phone]

## 2012-04-23 NOTE — ED Notes (Signed)
Patient tolerated ambulation well in hallway

## 2012-04-23 NOTE — ED Provider Notes (Signed)
4:49 PM The patient feels much better at this time.  I suspect the majority of this is volume depletion.  Vital signs are normal.  Ambulated in the hall.  Discharge home in good condition.  Results for orders placed during the hospital encounter of 04/23/12  CBC WITH DIFFERENTIAL      Component Value Range   WBC 4.8  4.0 - 10.5 K/uL   RBC 4.21  3.87 - 5.11 MIL/uL   Hemoglobin 12.6  12.0 - 15.0 g/dL   HCT 45.4  09.8 - 11.9 %   MCV 91.2  78.0 - 100.0 fL   MCH 29.9  26.0 - 34.0 pg   MCHC 32.8  30.0 - 36.0 g/dL   RDW 14.7  82.9 - 56.2 %   Platelets 268  150 - 400 K/uL   Neutrophils Relative 53  43 - 77 %   Neutro Abs 2.5  1.7 - 7.7 K/uL   Lymphocytes Relative 38  12 - 46 %   Lymphs Abs 1.8  0.7 - 4.0 K/uL   Monocytes Relative 7  3 - 12 %   Monocytes Absolute 0.3  0.1 - 1.0 K/uL   Eosinophils Relative 1  0 - 5 %   Eosinophils Absolute 0.1  0.0 - 0.7 K/uL   Basophils Relative 1  0 - 1 %   Basophils Absolute 0.0  0.0 - 0.1 K/uL  COMPREHENSIVE METABOLIC PANEL      Component Value Range   Sodium 135  135 - 145 mEq/L   Potassium 4.9  3.5 - 5.1 mEq/L   Chloride 98  96 - 112 mEq/L   CO2 28  19 - 32 mEq/L   Glucose, Bld 95  70 - 99 mg/dL   BUN 15  6 - 23 mg/dL   Creatinine, Ser 1.30  0.50 - 1.10 mg/dL   Calcium 86.5  8.4 - 78.4 mg/dL   Total Protein 7.2  6.0 - 8.3 g/dL   Albumin 3.6  3.5 - 5.2 g/dL   AST 23  0 - 37 U/L   ALT 13  0 - 35 U/L   Alkaline Phosphatase 69  39 - 117 U/L   Total Bilirubin 0.7  0.3 - 1.2 mg/dL   GFR calc non Af Amer 75 (*) >90 mL/min   GFR calc Af Amer 87 (*) >90 mL/min  TROPONIN I      Component Value Range   Troponin I <0.30  <0.30 ng/mL  URINALYSIS, ROUTINE W REFLEX MICROSCOPIC      Component Value Range   Color, Urine YELLOW  YELLOW   APPearance CLEAR  CLEAR   Specific Gravity, Urine 1.009  1.005 - 1.030   pH 7.5  5.0 - 8.0   Glucose, UA NEGATIVE  NEGATIVE mg/dL   Hgb urine dipstick NEGATIVE  NEGATIVE   Bilirubin Urine NEGATIVE  NEGATIVE   Ketones, ur NEGATIVE  NEGATIVE mg/dL   Protein, ur NEGATIVE  NEGATIVE mg/dL   Urobilinogen, UA 1.0  0.0 - 1.0 mg/dL   Nitrite NEGATIVE  NEGATIVE   Leukocytes, UA TRACE (*) NEGATIVE  GLUCOSE, CAPILLARY      Component Value Range   Glucose-Capillary 87  70 - 99 mg/dL   Comment 1 Documented in Chart     Comment 2 Notify RN    URINE MICROSCOPIC-ADD ON      Component Value Range   Squamous Epithelial / LPF FEW (*) RARE   WBC, UA 0-2  <3 WBC/hpf   Bacteria, UA  FEW (*) RARE     Lyanne Co, MD 04/23/12 [phone]

## 2012-04-23 NOTE — ED Notes (Addendum)
Pt is difficult stick, attempted IV access x2, unsuccessful.  IV team rn notified.

## 2012-04-23 NOTE — ED Notes (Signed)
Pt reports she has gotten very dizzy and near syncopal twice today. Pt was standing up both times. Became very flushed, hot, then cold. C/o sob, NAD noted in triage, 100% RA sat. No complaints currently of dizziness, only mild sob.

## 2012-04-23 NOTE — Telephone Encounter (Signed)
Called and spoke with patients daughter Ella--Ella states patient has gone to hospital today for SOB.  However Ella's main reason for calling is to try and figure out what can be done about patients "flaky and dry" skin.  States patient has seen dermatologist but feels dermatologist "made light" of the situation . Samson Frederic says patient reports these symptoms have been going on since about the time she started taking BP med Atenolol in April.  Requesting rec on what to do about skin issue, Dr. Kriste Basque, please advise thank you!

## 2012-04-23 NOTE — ED Notes (Signed)
Stroke screen negative.

## 2012-04-23 NOTE — ED Notes (Signed)
Patient ambulated in hallways, walks fine but complains of being "swimy headed," RN notified.

## 2012-04-24 NOTE — Telephone Encounter (Signed)
Per SN--- stop the atenolol and follow up with SN in 2 weeks---can offer appt with SN on 11-14 at 2pm.  She will need to follow up with dermatology for her skin problem and if she would like a second opinion then we can set her up with [facility] dermatology.  Called and lmomtcb for the pts daughter ella.

## 2012-04-26 LAB — URINE CULTURE: Colony Count: 50000

## 2012-04-27 NOTE — Telephone Encounter (Signed)
ATC Ella NA unable to leave VM. Phone rang several times wcb

## 2012-04-28 NOTE — Telephone Encounter (Signed)
Called and spoke with Kathryn Whitaker---she is aware of SN recs to stop the atenolol for now and pt has appt with SN on 11/25 at 9:30am.  She is aware to have the pt  Check her BP at home and bring a list with her to this appt.  We will decided at this appt about the referral to [facility] dermatology.  Nothing further is needed.

## 2012-04-28 NOTE — Telephone Encounter (Signed)
Called and lmomtcb to discuss pt with her daughter.

## 2012-04-29 NOTE — ED Notes (Signed)
+  Urine. Chart sent to EDP office for review. Chart returned from EDP office. Per [facility] PA-C, likely contaminant. No need for abx at this time. Follow-up with PCP, return for worsening symptoms.

## 2012-05-18 ENCOUNTER — Other Ambulatory Visit (INDEPENDENT_AMBULATORY_CARE_PROVIDER_SITE_OTHER): Payer: Medicare Other

## 2012-05-18 ENCOUNTER — Encounter: Payer: Self-pay | Admitting: *Deleted

## 2012-05-18 ENCOUNTER — Ambulatory Visit (INDEPENDENT_AMBULATORY_CARE_PROVIDER_SITE_OTHER): Payer: Medicare Other | Admitting: Pulmonary Disease

## 2012-05-18 ENCOUNTER — Encounter: Payer: Self-pay | Admitting: Pulmonary Disease

## 2012-05-18 VITALS — BP 144/86 | HR 60 | Temp 96.8°F | Ht 63.0 in | Wt 144.0 lb

## 2012-05-18 DIAGNOSIS — D126 Benign neoplasm of colon, unspecified: Secondary | ICD-10-CM

## 2012-05-18 DIAGNOSIS — F411 Generalized anxiety disorder: Secondary | ICD-10-CM

## 2012-05-18 DIAGNOSIS — I6350 Cerebral infarction due to unspecified occlusion or stenosis of unspecified cerebral artery: Secondary | ICD-10-CM

## 2012-05-18 DIAGNOSIS — M545 Low back pain, unspecified: Secondary | ICD-10-CM

## 2012-05-18 DIAGNOSIS — K5730 Diverticulosis of large intestine without perforation or abscess without bleeding: Secondary | ICD-10-CM

## 2012-05-18 DIAGNOSIS — K589 Irritable bowel syndrome without diarrhea: Secondary | ICD-10-CM

## 2012-05-18 DIAGNOSIS — I10 Essential (primary) hypertension: Secondary | ICD-10-CM

## 2012-05-18 DIAGNOSIS — K219 Gastro-esophageal reflux disease without esophagitis: Secondary | ICD-10-CM

## 2012-05-18 DIAGNOSIS — N390 Urinary tract infection, site not specified: Secondary | ICD-10-CM

## 2012-05-18 DIAGNOSIS — E7800 Pure hypercholesterolemia, unspecified: Secondary | ICD-10-CM

## 2012-05-18 DIAGNOSIS — J209 Acute bronchitis, unspecified: Secondary | ICD-10-CM

## 2012-05-18 DIAGNOSIS — M1990 Unspecified osteoarthritis, unspecified site: Secondary | ICD-10-CM

## 2012-05-18 DIAGNOSIS — L309 Dermatitis, unspecified: Secondary | ICD-10-CM

## 2012-05-18 DIAGNOSIS — E559 Vitamin D deficiency, unspecified: Secondary | ICD-10-CM

## 2012-05-18 LAB — URINALYSIS, ROUTINE W REFLEX MICROSCOPIC
Specific Gravity, Urine: 1.02 (ref 1.000–1.030)
Urine Glucose: NEGATIVE
Urobilinogen, UA: 0.2 (ref 0.0–1.0)

## 2012-05-18 LAB — LDL CHOLESTEROL, DIRECT: Direct LDL: 175.1 mg/dL

## 2012-05-18 LAB — LIPID PANEL: HDL: 55 mg/dL (ref >39.00)

## 2012-05-18 MED ORDER — BUDESONIDE-FORMOTEROL FUMARATE 80-4.5 MCG/ACT IN AERO: 2.0000 | INHALATION_SPRAY | Freq: Two times a day (BID) | RESPIRATORY_TRACT | Status: DC | PRN

## 2012-05-18 MED ORDER — TRAMADOL HCL 50 MG PO TABS: 50.0000 mg | ORAL_TABLET | Freq: Four times a day (QID) | ORAL | Status: DC | PRN

## 2012-05-18 MED ORDER — ALPRAZOLAM 0.25 MG PO TABS: 0.2500 mg | ORAL_TABLET | Freq: Three times a day (TID) | ORAL | Status: DC | PRN

## 2012-05-18 MED ORDER — ALBUTEROL SULFATE HFA 108 (90 BASE) MCG/ACT IN AERS: 2.0000 | INHALATION_SPRAY | Freq: Four times a day (QID) | RESPIRATORY_TRACT | Status: DC | PRN
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
## 2012-05-18 NOTE — Patient Instructions (Addendum)
Today we updated your med list in our EPIC system...    Continue your current medications the same...    We refilled the meds you requested...  This report has an up to date list of your medications...    Call for any questions...  Today we did your follow up FASTING blood work & Urinalysis...    We will contact you w/ the results...  Stay as active as possible...    And BE SAFE...  Call for any questions...  Let's plan a follow up visit inn 4 months.Marland KitchenMarland Kitchen

## 2012-05-19 ENCOUNTER — Other Ambulatory Visit: Payer: Self-pay | Admitting: Pulmonary Disease

## 2012-05-19 LAB — URINE CULTURE: Colony Count: NO GROWTH

## 2012-05-19 MED ORDER — ATORVASTATIN CALCIUM 40 MG PO TABS: 40.0000 mg | ORAL_TABLET | Freq: Every day | ORAL | Status: DC

## 2012-07-14 ENCOUNTER — Other Ambulatory Visit (INDEPENDENT_AMBULATORY_CARE_PROVIDER_SITE_OTHER): Payer: Medicare Other

## 2012-07-14 ENCOUNTER — Encounter: Payer: Self-pay | Admitting: Pulmonary Disease

## 2012-07-14 ENCOUNTER — Ambulatory Visit (INDEPENDENT_AMBULATORY_CARE_PROVIDER_SITE_OTHER): Payer: Medicare Other | Admitting: Pulmonary Disease

## 2012-07-14 VITALS — BP 132/98 | HR 53 | Temp 97.0°F | Ht 63.0 in | Wt 142.8 lb

## 2012-07-14 DIAGNOSIS — E559 Vitamin D deficiency, unspecified: Secondary | ICD-10-CM

## 2012-07-14 DIAGNOSIS — I10 Essential (primary) hypertension: Secondary | ICD-10-CM

## 2012-07-14 DIAGNOSIS — D126 Benign neoplasm of colon, unspecified: Secondary | ICD-10-CM

## 2012-07-14 DIAGNOSIS — J209 Acute bronchitis, unspecified: Secondary | ICD-10-CM

## 2012-07-14 DIAGNOSIS — E7800 Pure hypercholesterolemia, unspecified: Secondary | ICD-10-CM

## 2012-07-14 DIAGNOSIS — M545 Low back pain: Secondary | ICD-10-CM

## 2012-07-14 DIAGNOSIS — K589 Irritable bowel syndrome without diarrhea: Secondary | ICD-10-CM

## 2012-07-14 DIAGNOSIS — F411 Generalized anxiety disorder: Secondary | ICD-10-CM

## 2012-07-14 DIAGNOSIS — I6350 Cerebral infarction due to unspecified occlusion or stenosis of unspecified cerebral artery: Secondary | ICD-10-CM

## 2012-07-14 DIAGNOSIS — K5730 Diverticulosis of large intestine without perforation or abscess without bleeding: Secondary | ICD-10-CM

## 2012-07-14 DIAGNOSIS — K219 Gastro-esophageal reflux disease without esophagitis: Secondary | ICD-10-CM

## 2012-07-14 DIAGNOSIS — M1990 Unspecified osteoarthritis, unspecified site: Secondary | ICD-10-CM

## 2012-07-14 LAB — LIPID PANEL
Total CHOL/HDL Ratio: 5
VLDL: 17.2 mg/dL (ref 0.0–40.0)

## 2012-07-14 LAB — HEPATIC FUNCTION PANEL
Alkaline Phosphatase: 71 U/L (ref 39–117)
Bilirubin, Direct: 0.1 mg/dL (ref 0.0–0.3)
Total Bilirubin: 0.7 mg/dL (ref 0.3–1.2)

## 2012-07-14 MED ORDER — LEVOFLOXACIN 500 MG PO TABS: 500.0000 mg | ORAL_TABLET | Freq: Every day | ORAL | Status: DC

## 2012-07-14 MED ORDER — LOSARTAN POTASSIUM 100 MG PO TABS: 100.0000 mg | ORAL_TABLET | Freq: Every day | ORAL | Status: DC

## 2012-07-14 MED ORDER — HYDROCOD POLST-CHLORPHEN POLST 10-8 MG/5ML PO LQCR: 5.0000 mL | Freq: Two times a day (BID) | ORAL | Status: DC
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
## 2012-07-14 NOTE — Patient Instructions (Addendum)
Today we updated your med list in our EPIC system...    Continue your current medications the same...  We decided to restart your Cozaar (Losartan) 100mg - one tab daily for your BP...  For your upper resp infection:    Take the [facility] 500mg  one tab daily for 7d...    Use the TUSSIONEX cough syrup- one tsp every 12H as needed for cough...  For your bowels:    Take a probiotic like ALIGN OTC one daily while you are on the antibiotic Rx...    For constip- use the Miralax &/or Senakot-S as needed to keep you regular...  Call for any questions...   Keep your prev sched appt 09/16/12 at 10AM for follow up.Marland KitchenMarland Kitchen

## 2012-07-21 ENCOUNTER — Other Ambulatory Visit: Payer: Self-pay | Admitting: Pulmonary Disease

## 2012-07-21 MED ORDER — ATORVASTATIN CALCIUM 80 MG PO TABS: 80.0000 mg | ORAL_TABLET | Freq: Every day | ORAL | Status: DC

## 2012-09-15 ENCOUNTER — Ambulatory Visit: Payer: Medicare Other | Admitting: Pulmonary Disease

## 2012-09-16 ENCOUNTER — Ambulatory Visit: Payer: Medicare Other | Admitting: Pulmonary Disease

## 2012-11-17 ENCOUNTER — Other Ambulatory Visit: Payer: Self-pay | Admitting: Pulmonary Disease

## 2012-11-20 ENCOUNTER — Emergency Department (HOSPITAL_COMMUNITY): Payer: Medicare Other

## 2012-11-20 ENCOUNTER — Encounter: Payer: Self-pay | Admitting: Adult Health

## 2012-11-20 ENCOUNTER — Emergency Department (HOSPITAL_COMMUNITY)
Admission: EM | Admit: 2012-11-20 | Discharge: 2012-11-20 | Disposition: A | Discharge status: Home / Self Care | Payer: Medicare Other | Attending: Emergency Medicine | Admitting: Emergency Medicine

## 2012-11-20 ENCOUNTER — Encounter (HOSPITAL_COMMUNITY): Payer: Self-pay | Admitting: Emergency Medicine

## 2012-11-20 ENCOUNTER — Telehealth: Payer: Self-pay | Admitting: Adult Health

## 2012-11-20 ENCOUNTER — Ambulatory Visit (INDEPENDENT_AMBULATORY_CARE_PROVIDER_SITE_OTHER): Payer: Medicare Other | Admitting: Adult Health

## 2012-11-20 VITALS — BP 142/90 | HR 81 | Temp 97.0°F | Ht 63.0 in | Wt 138.8 lb

## 2012-11-20 DIAGNOSIS — K219 Gastro-esophageal reflux disease without esophagitis: Secondary | ICD-10-CM | POA: Insufficient documentation

## 2012-11-20 DIAGNOSIS — R531 Weakness: Secondary | ICD-10-CM

## 2012-11-20 DIAGNOSIS — Z8679 Personal history of other diseases of the circulatory system: Secondary | ICD-10-CM | POA: Insufficient documentation

## 2012-11-20 DIAGNOSIS — R05 Cough: Secondary | ICD-10-CM | POA: Insufficient documentation

## 2012-11-20 DIAGNOSIS — M545 Low back pain, unspecified: Secondary | ICD-10-CM | POA: Insufficient documentation

## 2012-11-20 DIAGNOSIS — R0602 Shortness of breath: Secondary | ICD-10-CM

## 2012-11-20 DIAGNOSIS — J45901 Unspecified asthma with (acute) exacerbation: Secondary | ICD-10-CM | POA: Insufficient documentation

## 2012-11-20 DIAGNOSIS — R5381 Other malaise: Secondary | ICD-10-CM | POA: Insufficient documentation

## 2012-11-20 DIAGNOSIS — R110 Nausea: Secondary | ICD-10-CM | POA: Insufficient documentation

## 2012-11-20 DIAGNOSIS — R350 Frequency of micturition: Secondary | ICD-10-CM | POA: Insufficient documentation

## 2012-11-20 DIAGNOSIS — Z87440 Personal history of urinary (tract) infections: Secondary | ICD-10-CM | POA: Insufficient documentation

## 2012-11-20 DIAGNOSIS — K5900 Constipation, unspecified: Secondary | ICD-10-CM | POA: Insufficient documentation

## 2012-11-20 DIAGNOSIS — E785 Hyperlipidemia, unspecified: Secondary | ICD-10-CM | POA: Insufficient documentation

## 2012-11-20 DIAGNOSIS — Z860100 Personal history of colon polyps, unspecified: Secondary | ICD-10-CM | POA: Insufficient documentation

## 2012-11-20 DIAGNOSIS — R059 Cough, unspecified: Secondary | ICD-10-CM | POA: Insufficient documentation

## 2012-11-20 DIAGNOSIS — IMO0002 Reserved for concepts with insufficient information to code with codable children: Secondary | ICD-10-CM | POA: Insufficient documentation

## 2012-11-20 DIAGNOSIS — Z8719 Personal history of other diseases of the digestive system: Secondary | ICD-10-CM | POA: Insufficient documentation

## 2012-11-20 DIAGNOSIS — R300 Dysuria: Secondary | ICD-10-CM | POA: Insufficient documentation

## 2012-11-20 DIAGNOSIS — R109 Unspecified abdominal pain: Secondary | ICD-10-CM | POA: Insufficient documentation

## 2012-11-20 DIAGNOSIS — F411 Generalized anxiety disorder: Secondary | ICD-10-CM | POA: Insufficient documentation

## 2012-11-20 DIAGNOSIS — J069 Acute upper respiratory infection, unspecified: Secondary | ICD-10-CM

## 2012-11-20 DIAGNOSIS — E559 Vitamin D deficiency, unspecified: Secondary | ICD-10-CM | POA: Insufficient documentation

## 2012-11-20 DIAGNOSIS — G8929 Other chronic pain: Secondary | ICD-10-CM | POA: Insufficient documentation

## 2012-11-20 DIAGNOSIS — I10 Essential (primary) hypertension: Secondary | ICD-10-CM | POA: Insufficient documentation

## 2012-11-20 DIAGNOSIS — Z90710 Acquired absence of both cervix and uterus: Secondary | ICD-10-CM | POA: Insufficient documentation

## 2012-11-20 DIAGNOSIS — Z79899 Other long term (current) drug therapy: Secondary | ICD-10-CM | POA: Insufficient documentation

## 2012-11-20 LAB — COMPREHENSIVE METABOLIC PANEL
ALT: 10 U/L (ref 0–35)
AST: 18 U/L (ref 0–37)
Albumin: 3.5 g/dL (ref 3.5–5.2)
CO2: 28 mEq/L (ref 19–32)
Calcium: 9.8 mg/dL (ref 8.4–10.5)
Chloride: 103 mEq/L (ref 96–112)
Creatinine, Ser: 0.7 mg/dL (ref 0.50–1.10)
GFR calc non Af Amer: 73 mL/min — ABNORMAL LOW (ref >90)
Sodium: 139 mEq/L (ref 135–145)

## 2012-11-20 LAB — CBC WITH DIFFERENTIAL/PLATELET
Basophils Absolute: 0 10*3/uL (ref 0.0–0.1)
Basophils Relative: 1 % (ref 0–1)
Eosinophils Relative: 1 % (ref 0–5)
Lymphocytes Relative: 35 % (ref 12–46)
MCHC: 32.9 g/dL (ref 30.0–36.0)
Monocytes Absolute: 0.4 10*3/uL (ref 0.1–1.0)
Neutro Abs: 2.3 10*3/uL (ref 1.7–7.7)
Platelets: 248 10*3/uL (ref 150–400)
RDW: 13.2 % (ref 11.5–15.5)
WBC: 4.1 10*3/uL (ref 4.0–10.5)

## 2012-11-20 LAB — URINALYSIS, ROUTINE W REFLEX MICROSCOPIC
Glucose, UA: NEGATIVE mg/dL
Hgb urine dipstick: NEGATIVE
Specific Gravity, Urine: 1.009 (ref 1.005–1.030)

## 2012-11-20 LAB — TROPONIN I: Troponin I: <0.3 ng/mL (ref <0.30)

## 2012-11-20 MED ORDER — CEFDINIR 300 MG PO CAPS: 300.0000 mg | ORAL_CAPSULE | Freq: Two times a day (BID) | ORAL | Status: DC

## 2012-11-20 NOTE — ED Provider Notes (Signed)
History     CSN: [id number]  Arrival date & time 11/20/12  1107   First MD Initiated Contact with Patient 11/20/12 1159      Chief Complaint  Patient presents with  . Shortness of Breath  . Abdominal Pain  . Urinary Frequency    (Consider location/radiation/quality/duration/timing/severity/associated sxs/prior treatment) HPI Comments: 79 year old female with a history of chronic low back pain, lacunar infarction, hypertension and asthmatic bronchitis who presents with a complaint of weakness and shortness of breath. She is unable to tell me exactly how long this has been going on but she feels like it is greater than 6 months, gradually worsening and associated with dyspnea on exertion as well as mild orthopnea at night. She endorses a chronic cough and a weight loss of over 60 pounds in the last 12-18 months. She denies swelling in her legs, leg pain or trauma. She does have dysuria but no fevers or chills, she does have nausea but no vomiting and no diarrhea. She has had increased her stools which have become less frequent over the last week. She had called her doctor this morning which was Dr. Kriste Basque, so the physician assistant, had a headache at that time, was given a pain prescription but did not get it filled. She wanted to go home because she was feeling generally weak and mildly short of breath. The patient denies any history of smoking cigarettes, chewing tobacco or alcohol use.  Patient is a 79 y.o. female presenting with shortness of breath, abdominal pain, and frequency. The history is provided by the patient and a relative.  Shortness of Breath Associated symptoms: abdominal pain   Abdominal Pain Associated symptoms include abdominal pain and shortness of breath.  Urinary Frequency Associated symptoms include abdominal pain and shortness of breath.    Past Medical History  Diagnosis Date  . Allergic rhinitis   . Acute asthmatic bronchitis   . Hypertension   .  Hypercholesteremia   . GERD (gastroesophageal reflux disease)   . Diverticulosis of colon   . IBS (irritable bowel syndrome)   . History of colonic polyps   . UTI (lower urinary tract infection)   . DJD (degenerative joint disease)   . Lumbar back pain   . Lacunar infarction   . Vitamin D deficiency   . Anxiety     Past Surgical History  Procedure Laterality Date  . Abdominal hysterectomy      No family history on file.  History  Substance Use Topics  . Smoking status: Never Smoker   . Smokeless tobacco: Not on file  . Alcohol Use: No    OB History   Grav Para Term Preterm Abortions TAB SAB Ect Mult Living                  Review of Systems  Respiratory: Positive for shortness of breath.   Gastrointestinal: Positive for abdominal pain.  Genitourinary: Positive for frequency.  All other systems reviewed and are negative.    Allergies  Telithromycin  Home Medications   Current Outpatient Rx  Name  Route  Sig  Dispense  Refill  . traMADol (ULTRAM) 50 MG tablet   Oral   Take 1 tablet (50 mg total) by mouth every 6 (six) hours as needed for pain.   100 tablet   6   . albuterol (PROVENTIL HFA) 108 (90 BASE) MCG/ACT inhaler   Inhalation   Inhale 2 puffs into the lungs every 6 (six) hours as needed. For shortness  of breath   1 Inhaler   11   . ALPRAZolam (XANAX) 0.25 MG tablet   Oral   Take 1 tablet (0.25 mg total) by mouth 3 (three) times daily as needed. for anxiety   90 tablet   5   . aspirin EC 81 MG tablet   Oral   Take 81 mg by mouth daily.         Marland Kitchen atorvastatin (LIPITOR) 80 MG tablet   Oral   Take 1 tablet (80 mg total) by mouth daily.   30 tablet   11   . budesonide-formoterol (SYMBICORT) 80-4.5 MCG/ACT inhaler   Inhalation   Inhale 2 puffs into the lungs 2 (two) times daily as needed. For shortness of breath   1 Inhaler   0   . Calcium Carbonate-Vitamin D (CALTRATE 600+D) 600-400 MG-UNIT per tablet   Oral   Take 1 tablet by mouth  daily. For bone health         . cefdinir (OMNICEF) 300 MG capsule   Oral   Take 1 capsule (300 mg total) by mouth 2 (two) times daily.   14 capsule   0   . chlorpheniramine-HYDROcodone (TUSSIONEX PENNKINETIC ER) 10-8 MG/5ML LQCR   Oral   Take 5 mLs by mouth every 12 (twelve) hours.   140 mL   1   . Cholecalciferol (VITAMIN D) 1000 UNITS capsule   Oral   Take 1,000 Units by mouth daily.           . fish oil-omega-3 fatty acids 1000 MG capsule   Oral   Take 1 g by mouth 2 (two) times daily.          Marland Kitchen lidocaine (LIDODERM) 5 %   Transdermal   Place 1 patch onto the skin every 12 (twelve) hours. Remove & Discard patch within 12 hours or as directed by MD Patient never filled Rx, patient states lost prescription         . losartan (COZAAR) 100 MG tablet   Oral   Take 1 tablet (100 mg total) by mouth daily.   30 tablet   5   . Multiple Vitamin (MULTIVITAMIN WITH MINERALS) TABS   Oral   Take 1 tablet by mouth daily.         Marland Kitchen omeprazole (PRILOSEC) 20 MG capsule               . polyethylene glycol (MIRALAX / GLYCOLAX) packet   Oral   Take 17 g by mouth daily as needed. For constipation         . senna-docusate (SENOKOT-S) 8.6-50 MG per tablet   Oral   Take 1 tablet by mouth daily as needed. For constipation           BP 136/80  Pulse 68  Temp(Src) 98 F (36.7 C) (Oral)  SpO2 99%  Physical Exam  Nursing note and vitals reviewed. Constitutional: She appears well-developed and well-nourished. No distress.  HENT:  Head: Normocephalic and atraumatic.  Mouth/Throat: Oropharynx is clear and moist. No oropharyngeal exudate.  Eyes: Conjunctivae and EOM are normal. Pupils are equal, round, and reactive to light. Right eye exhibits no discharge. Left eye exhibits no discharge. No scleral icterus.  Neck: Normal range of motion. Neck supple. No JVD present. No thyromegaly present.  Cardiovascular: Normal rate, regular rhythm, normal heart sounds and intact  distal pulses.  Exam reveals no gallop and no friction rub.   No murmur heard. Pulmonary/Chest: Effort normal and  breath sounds normal. No respiratory distress. She has no wheezes. She has no rales.  Abdominal: Soft. Bowel sounds are normal. She exhibits no distension and no mass. There is no tenderness.  Musculoskeletal: Normal range of motion. She exhibits no edema and no tenderness.  Lymphadenopathy:    She has no cervical adenopathy.  Neurological: She is alert. Coordination normal.  Normal strength, sensation, speech, coordination. No apparent facial droop  Skin: Skin is warm and dry. No rash noted. No erythema.  Psychiatric: She has a normal mood and affect. Her behavior is normal.    ED Course  Procedures (including critical care time)  Labs Reviewed  COMPREHENSIVE METABOLIC PANEL - Abnormal; Notable for the following:    GFR calc non Af Amer 73 (*)    GFR calc Af Amer 85 (*)    All other components within normal limits  URINALYSIS, ROUTINE W REFLEX MICROSCOPIC  CBC WITH DIFFERENTIAL  PRO B NATRIURETIC PEPTIDE  TROPONIN I   Dg Chest 2 View  11/20/2012   *RADIOLOGY REPORT*  Clinical Data: Asthma exacerbation shortness of breath.  CHEST - 2 VIEW  Comparison: Two-view chest x-ray 03/01/2012, 04/09/2010, 07/10/2009.  Findings: Cardiac silhouette upper normal in size for the AP semi- erect technique, unchanged.  Thoracic aorta tortuous and mildly atherosclerotic, unchanged.  Hilar and mediastinal contours otherwise unremarkable.  Lungs clear.  Bronchovascular markings normal.  Pulmonary vascularity normal.  No pneumothorax.  No pleural effusions.  Degenerative changes involving the thoracic spine and mild generalized osseous demineralization.  IMPRESSION: No acute cardiopulmonary disease.   Original Report Authenticated By: Hulan Saas, M.D.     1. Weakness   2. Shortness of breath       MDM  The patient has no significant findings on her exam that are of concern for acute  MI, acute respiratory distress or acute neurologic dysfunction. She has normal strength and sensation, normal speech and memory and does not appear to be in any distress. We'll check metabolic, infectious workup as well as a cardiac workup given her dyspnea on exertion and weakness.  ED ECG REPORT  I personally interpreted this EKG   Date: 11/20/2012   Rate: 69  Rhythm: normal sinus rhythm  QRS Axis: left  Intervals: normal  ST/T Wave abnormalities: normal  Conduction Disutrbances:none  Narrative Interpretation:   Old EKG Reviewed: Compared with 04/23/2012, mild criteria for LVH no seen, anterior MI Q waves present. No acute changes  Labs unremarkable - pt has remained in no distress with normal VS and normal exam.  She is stable for d/c.  She has no ongoing weakness, no focal weakness and no signs of resp distress / hypoxia or dyspnea.   Pt expresses understanding to plan.  Meds given in ED:  Medications - No data to display  New Prescriptions   No medications on file           Vida Roller, MD 11/20/12 1437

## 2012-11-20 NOTE — Assessment & Plan Note (Signed)
  Omnicef 300mg  Twice daily  For 7 days  Mucinex DM Twice daily  As needed  Cough/congestion  Please contact office for sooner follow up if symptoms do not improve or worsen or seek emergency care  follow up Dr. Kriste Basque  As planned and As needed

## 2012-11-20 NOTE — ED Notes (Signed)
Pt was seen at her pcp pta and was given medication for being constipated and was checked for blood in her stool which was negative. Pt states that she has urniary frequency and lower abd pain.
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
## 2012-11-20 NOTE — Telephone Encounter (Signed)
Spoke with daughter,  Advised of visit findings  Pt to follow up with Dr. Kriste Basque  As planned and As needed   Please contact office for sooner follow up if symptoms do not improve or worsen or seek emergency care

## 2012-11-20 NOTE — Telephone Encounter (Signed)
Spoke with the pt's daughter Kathryn Whitaker She states that the pt called her after her visit here today and "was just a mess" She states that she told her that she was too sick to go the the pharmacy and get rx to for abx so she went to ED and is there now Daughter is asking to know what state of mind TP thought that she was in while she was here in the office She also wants to know what TP thinks is causing the rash that the pt has had for almost a year now  I advised will ask TP, but the pt was not seen today with co's of having a rash Please advise, thanks!

## 2012-11-20 NOTE — Telephone Encounter (Signed)
Pt's daughter returned call. Holly D Pryor ° °

## 2012-11-20 NOTE — Patient Instructions (Addendum)
Add Stool Softner -Colace 100mg  At bedtime   Continue on Miralax 1 capful daily  Increase fluids-water intake  High fiber diet.  Omnicef 300mg  Twice daily  For 7 days  Mucinex DM Twice daily  As needed  Cough/congestion  Please contact office for sooner follow up if symptoms do not improve or worsen or seek emergency care  follow up Dr. Kriste Basque  As planned and As needed

## 2012-11-20 NOTE — Assessment & Plan Note (Signed)
Add Stool Softner -Colace 100mg  At bedtime   Continue on Miralax 1 capful daily  Increase fluids-water intake  High fiber diet.  Please contact office for sooner follow up if symptoms do not improve or worsen or seek emergency care  follow up Dr. Kriste Basque  As planned and As needed

## 2012-11-20 NOTE — Telephone Encounter (Signed)
LMTCB on both numbers provided 

## 2012-12-10 ENCOUNTER — Observation Stay (HOSPITAL_COMMUNITY)
Admission: EM | Admit: 2012-12-10 | Discharge: 2012-12-14 | Disposition: A | Discharge status: Skilled Nursing Facility | Payer: Medicare Other | Attending: Internal Medicine | Admitting: Internal Medicine

## 2012-12-10 ENCOUNTER — Telehealth: Payer: Self-pay | Admitting: Pulmonary Disease

## 2012-12-10 ENCOUNTER — Encounter (HOSPITAL_COMMUNITY): Payer: Self-pay | Admitting: *Deleted

## 2012-12-10 ENCOUNTER — Emergency Department (HOSPITAL_COMMUNITY): Payer: Medicare Other

## 2012-12-10 DIAGNOSIS — K219 Gastro-esophageal reflux disease without esophagitis: Secondary | ICD-10-CM | POA: Insufficient documentation

## 2012-12-10 DIAGNOSIS — R0609 Other forms of dyspnea: Secondary | ICD-10-CM | POA: Diagnosis present

## 2012-12-10 DIAGNOSIS — Z7982 Long term (current) use of aspirin: Secondary | ICD-10-CM | POA: Insufficient documentation

## 2012-12-10 DIAGNOSIS — R634 Abnormal weight loss: Secondary | ICD-10-CM | POA: Insufficient documentation

## 2012-12-10 DIAGNOSIS — F411 Generalized anxiety disorder: Secondary | ICD-10-CM | POA: Insufficient documentation

## 2012-12-10 DIAGNOSIS — J209 Acute bronchitis, unspecified: Secondary | ICD-10-CM

## 2012-12-10 DIAGNOSIS — E785 Hyperlipidemia, unspecified: Secondary | ICD-10-CM | POA: Insufficient documentation

## 2012-12-10 DIAGNOSIS — R0789 Other chest pain: Principal | ICD-10-CM | POA: Insufficient documentation

## 2012-12-10 DIAGNOSIS — R9431 Abnormal electrocardiogram [ECG] [EKG]: Secondary | ICD-10-CM | POA: Insufficient documentation

## 2012-12-10 DIAGNOSIS — R079 Chest pain, unspecified: Secondary | ICD-10-CM

## 2012-12-10 DIAGNOSIS — J309 Allergic rhinitis, unspecified: Secondary | ICD-10-CM

## 2012-12-10 DIAGNOSIS — R059 Cough, unspecified: Secondary | ICD-10-CM | POA: Insufficient documentation

## 2012-12-10 DIAGNOSIS — Z79899 Other long term (current) drug therapy: Secondary | ICD-10-CM | POA: Insufficient documentation

## 2012-12-10 DIAGNOSIS — E440 Moderate protein-calorie malnutrition: Secondary | ICD-10-CM | POA: Insufficient documentation

## 2012-12-10 DIAGNOSIS — R0989 Other specified symptoms and signs involving the circulatory and respiratory systems: Secondary | ICD-10-CM | POA: Insufficient documentation

## 2012-12-10 DIAGNOSIS — R05 Cough: Secondary | ICD-10-CM | POA: Insufficient documentation

## 2012-12-10 DIAGNOSIS — R300 Dysuria: Secondary | ICD-10-CM | POA: Insufficient documentation

## 2012-12-10 DIAGNOSIS — E7800 Pure hypercholesterolemia, unspecified: Secondary | ICD-10-CM

## 2012-12-10 DIAGNOSIS — I10 Essential (primary) hypertension: Secondary | ICD-10-CM | POA: Diagnosis present

## 2012-12-10 LAB — BASIC METABOLIC PANEL
BUN: 17 mg/dL (ref 6–23)
CO2: 25 mEq/L (ref 19–32)
Calcium: 9.7 mg/dL (ref 8.4–10.5)
Chloride: 103 mEq/L (ref 96–112)
Creatinine, Ser: 0.81 mg/dL (ref 0.50–1.10)
Glucose, Bld: 135 mg/dL — ABNORMAL HIGH (ref 70–99)

## 2012-12-10 LAB — URINALYSIS W MICROSCOPIC + REFLEX CULTURE
Glucose, UA: NEGATIVE mg/dL
Hgb urine dipstick: NEGATIVE
Protein, ur: NEGATIVE mg/dL
Specific Gravity, Urine: 1.02 (ref 1.005–1.030)
Urobilinogen, UA: 0.2 mg/dL (ref 0.0–1.0)

## 2012-12-10 LAB — CBC WITH DIFFERENTIAL/PLATELET
Basophils Absolute: 0.1 10*3/uL (ref 0.0–0.1)
Eosinophils Relative: 1 % (ref 0–5)
HCT: 37.2 % (ref 36.0–46.0)
Hemoglobin: 12.4 g/dL (ref 12.0–15.0)
Lymphocytes Relative: 32 % (ref 12–46)
Lymphs Abs: 1.4 10*3/uL (ref 0.7–4.0)
MCV: 90.1 fL (ref 78.0–100.0)
Monocytes Absolute: 0.6 10*3/uL (ref 0.1–1.0)
Monocytes Relative: 13 % — ABNORMAL HIGH (ref 3–12)
Neutro Abs: 2.2 10*3/uL (ref 1.7–7.7)
RBC: 4.13 MIL/uL (ref 3.87–5.11)
WBC: 4.3 10*3/uL (ref 4.0–10.5)

## 2012-12-10 LAB — TROPONIN I: Troponin I: <0.3 ng/mL (ref <0.30)

## 2012-12-10 MED ORDER — ALPRAZOLAM 0.25 MG PO TABS
0.2500 mg | ORAL_TABLET | Freq: Three times a day (TID) | ORAL | Status: DC | PRN
  Administered 2012-12-12 – 2012-12-14 (×2): 0.25 mg via ORAL
  Filled 2012-12-10 (×2): qty 1

## 2012-12-10 MED ORDER — SENNOSIDES-DOCUSATE SODIUM 8.6-50 MG PO TABS
1.0000 | ORAL_TABLET | Freq: Every day | ORAL | Status: DC
  Administered 2012-12-10 – 2012-12-13 (×4): 1 via ORAL
  Filled 2012-12-10 (×5): qty 1

## 2012-12-10 MED ORDER — SODIUM CHLORIDE 0.9 % IJ SOLN
3.0000 mL | Freq: Two times a day (BID) | INTRAMUSCULAR | Status: DC
  Administered 2012-12-10 – 2012-12-14 (×8): 3 mL via INTRAVENOUS

## 2012-12-10 MED ORDER — LOSARTAN POTASSIUM 50 MG PO TABS
100.0000 mg | ORAL_TABLET | Freq: Every day | ORAL | Status: DC
  Administered 2012-12-11 – 2012-12-14 (×4): 100 mg via ORAL
  Filled 2012-12-10 (×4): qty 2

## 2012-12-10 MED ORDER — ADULT MULTIVITAMIN W/MINERALS CH
1.0000 | ORAL_TABLET | Freq: Every day | ORAL | Status: DC
  Administered 2012-12-11 – 2012-12-14 (×4): 1 via ORAL
  Filled 2012-12-10 (×4): qty 1

## 2012-12-10 MED ORDER — ACETAMINOPHEN 650 MG RE SUPP: 650.0000 mg | Freq: Four times a day (QID) | RECTAL | Status: DC | PRN

## 2012-12-10 MED ORDER — TRAMADOL HCL 50 MG PO TABS
50.0000 mg | ORAL_TABLET | Freq: Four times a day (QID) | ORAL | Status: DC | PRN
  Administered 2012-12-11 – 2012-12-13 (×3): 50 mg via ORAL
  Filled 2012-12-10 (×3): qty 1

## 2012-12-10 MED ORDER — PANTOPRAZOLE SODIUM 40 MG PO TBEC
40.0000 mg | DELAYED_RELEASE_TABLET | Freq: Every day | ORAL | Status: DC
  Administered 2012-12-11 – 2012-12-14 (×4): 40 mg via ORAL
  Filled 2012-12-10 (×4): qty 1

## 2012-12-10 MED ORDER — NITROGLYCERIN 0.4 MG SL SUBL: 0.4000 mg | SUBLINGUAL_TABLET | SUBLINGUAL | Status: DC | PRN

## 2012-12-10 MED ORDER — VITAMIN D 1000 UNITS PO TABS
1000.0000 [IU] | ORAL_TABLET | Freq: Every day | ORAL | Status: DC
  Administered 2012-12-11 – 2012-12-14 (×4): 1000 [IU] via ORAL
  Filled 2012-12-10 (×4): qty 1

## 2012-12-10 MED ORDER — ATORVASTATIN CALCIUM 80 MG PO TABS
80.0000 mg | ORAL_TABLET | Freq: Every day | ORAL | Status: DC
  Administered 2012-12-10 – 2012-12-14 (×5): 80 mg via ORAL
  Filled 2012-12-10 (×5): qty 1

## 2012-12-10 MED ORDER — ENOXAPARIN SODIUM 40 MG/0.4ML ~~LOC~~ SOLN
40.0000 mg | SUBCUTANEOUS | Status: DC
  Administered 2012-12-10 – 2012-12-13 (×4): 40 mg via SUBCUTANEOUS
  Filled 2012-12-10 (×5): qty 0.4

## 2012-12-10 MED ORDER — ASPIRIN EC 325 MG PO TBEC
325.0000 mg | DELAYED_RELEASE_TABLET | Freq: Every day | ORAL | Status: DC
  Administered 2012-12-10 – 2012-12-14 (×5): 325 mg via ORAL
  Filled 2012-12-10 (×5): qty 1

## 2012-12-10 MED ORDER — BUDESONIDE-FORMOTEROL FUMARATE 80-4.5 MCG/ACT IN AERO
2.0000 | INHALATION_SPRAY | Freq: Two times a day (BID) | RESPIRATORY_TRACT | Status: DC
  Administered 2012-12-11 – 2012-12-14 (×7): 2 via RESPIRATORY_TRACT
  Filled 2012-12-10: qty 6.9

## 2012-12-10 MED ORDER — ACETAMINOPHEN 325 MG PO TABS
650.0000 mg | ORAL_TABLET | Freq: Four times a day (QID) | ORAL | Status: DC | PRN
  Administered 2012-12-11: 650 mg via ORAL
  Filled 2012-12-10: qty 2

## 2012-12-10 MED ORDER — CALCIUM CARBONATE-VITAMIN D 500-200 MG-UNIT PO TABS
2.0000 | ORAL_TABLET | Freq: Every day | ORAL | Status: DC
  Administered 2012-12-11 – 2012-12-14 (×4): 2 via ORAL
  Filled 2012-12-10 (×4): qty 2

## 2012-12-10 MED ORDER — ALBUTEROL SULFATE HFA 108 (90 BASE) MCG/ACT IN AERS
2.0000 | INHALATION_SPRAY | Freq: Four times a day (QID) | RESPIRATORY_TRACT | Status: DC | PRN
  Administered 2012-12-13: 2 via RESPIRATORY_TRACT
  Filled 2012-12-10: qty 6.7

## 2012-12-10 MED ORDER — OMEGA-3-ACID ETHYL ESTERS 1 G PO CAPS
1.0000 g | ORAL_CAPSULE | Freq: Two times a day (BID) | ORAL | Status: DC
  Administered 2012-12-10 – 2012-12-14 (×8): 1 g via ORAL
  Filled 2012-12-10 (×9): qty 1

## 2012-12-10 MED ORDER — CALCIUM CARBONATE-VITAMIN D 600-400 MG-UNIT PO TABS: 1.0000 | ORAL_TABLET | Freq: Every day | ORAL | Status: DC

## 2012-12-10 MED ORDER — MORPHINE SULFATE 4 MG/ML IJ SOLN: 4.0000 mg | Freq: Once | INTRAMUSCULAR | Status: DC

## 2012-12-10 MED ORDER — POLYETHYLENE GLYCOL 3350 17 G PO PACK
17.0000 g | PACK | Freq: Every day | ORAL | Status: DC
  Administered 2012-12-11 – 2012-12-14 (×4): 17 g via ORAL
  Filled 2012-12-10 (×4): qty 1

## 2012-12-10 MED ORDER — ASPIRIN 325 MG PO TABS
325.0000 mg | ORAL_TABLET | Freq: Once | ORAL | Status: AC
  Administered 2012-12-10: 325 mg via ORAL
  Filled 2012-12-10: qty 1

## 2012-12-10 NOTE — ED Notes (Addendum)
Pt reports productive cough green sputum x4 days, SOB and left sided pain x2-3 days. Reports left sided pain with slight pain in left side of abdomen 9/10.   Also repeats increased weakness and difficulty walking x2 days.

## 2012-12-10 NOTE — ED Notes (Signed)
Called to give report, secretary reports a nurse has not been assigned.

## 2012-12-10 NOTE — H&P (Signed)
[facility] History and Physical  Kathryn Whitaker ZOX:[id number] DOB: [birth year] DOA: 12/10/2012   PCP: Michele Mcalpine, MD   Chief Complaint: Chest pain  HPI:  79 year old female with a history of hypertension, hyperlipidemia, GERD, recurrent infarct presents with one-day history of worsening left-sided chest pain. The patient states that she has had left-sided chest discomfort for a number of years, but she came to the emergency department today because of the pain "hurting harder". She describes it as a sharp stabbing pain that lasted approximately 1 minute without any radiation to the arm or jaw. She states that the pain can come and go at rest as well as with exertion. Of note, the patient states that she has had decreased exercise tolerance, increasing dyspnea on exertion for the past 6 months. She notes that she does have increasing difficulty with the 12 steps that she has a house. The patient is very independent and performs all her instrumental ADLs. However over the past 6 months she has noted increasing frequency of her chest discomfort and pain as well as at rest with increasing dyspnea on exertion. She denies any dizziness, diaphoresis, nausea, vomiting, diarrhea. Has not had any fevers or chills. Most recently, the patient was treated with Omnicef on 11/20/2012 for bronchitis. She denies any syncope, focal extremity weakness, visual disturbance, dysarthria. She is compliant with her aspirin 81 mg daily.  In the emergency department, the patient had unremarkable CBC and BMP. Troponin was negative x1. EKG showed nonspecific ST elevation in V1 to V2, but this was unchanged when compared to previous EKGs. Chest x-ray was clear. Assessment/Plan: Chest discomfort/Angina -Patient has typical and atypical features -She has noted decreased exercise tolerance in the last 6 months. -Cycle troponins -May need to consider cardiology evaluation -Very low suspicion of PE--Well Score =  0 -Check hemoglobin A1c Hypertension -Continue losartan Hyperlipidemia -Continue atorvastatin Anxiety -Continue Xanax 0.25 mg 3 times a day when necessary anxiety GERD -Continue PPI Dysuria -Check urinalysis with reflex culture       Past Medical History  Diagnosis Date  . Allergic rhinitis   . Acute asthmatic bronchitis   . Hypertension   . Hypercholesteremia   . GERD (gastroesophageal reflux disease)   . Diverticulosis of colon   . IBS (irritable bowel syndrome)   . History of colonic polyps   . UTI (lower urinary tract infection)   . DJD (degenerative joint disease)   . Lumbar back pain   . Lacunar infarction   . Vitamin D deficiency   . Anxiety    Past Surgical History  Procedure Laterality Date  . Abdominal hysterectomy     Social History:  reports that she has never smoked. She has never used smokeless tobacco. She reports that she does not drink alcohol or use illicit drugs.   History reviewed. No pertinent family history.   Allergies  Allergen Reactions  . Demerol (Meperidine)     Pt states she can't remember type of reaction  . Influenza Vaccines     Pt can't remember reaction but states allergic and has not had in 25 years  . Telithromycin Hives    REACTION: pt states HIVES....      Prior to Admission medications   Medication Sig Start Date End Date Taking? Authorizing Provider  albuterol (PROVENTIL HFA) 108 (90 BASE) MCG/ACT inhaler Inhale 2 puffs into the lungs every 6 (six) hours as needed. For shortness of breath 05/18/12  Yes Michele Mcalpine, MD  ALPRAZolam Prudy Feeler) 0.25 MG  tablet Take 1 tablet (0.25 mg total) by mouth 3 (three) times daily as needed. for anxiety 05/18/12  Yes Michele Mcalpine, MD  aspirin EC 81 MG tablet Take 81 mg by mouth daily.   Yes Historical Provider, MD  atorvastatin (LIPITOR) 80 MG tablet Take 80 mg by mouth daily. 07/21/12  Yes Michele Mcalpine, MD  budesonide-formoterol (SYMBICORT) 80-4.5 MCG/ACT inhaler Inhale 2 puffs  into the lungs 2 (two) times daily as needed. For shortness of breath 05/18/12  Yes Michele Mcalpine, MD  Calcium Carbonate-Vitamin D (CALTRATE 600+D) 600-400 MG-UNIT per tablet Take 1 tablet by mouth daily. For bone health   Yes Historical Provider, MD  Cholecalciferol (VITAMIN D) 1000 UNITS capsule Take 1,000 Units by mouth daily.     Yes Historical Provider, MD  fish oil-omega-3 fatty acids 1000 MG capsule Take 1 g by mouth 2 (two) times daily.    Yes Historical Provider, MD  losartan (COZAAR) 100 MG tablet Take 100 mg by mouth daily. 07/14/12  Yes Michele Mcalpine, MD  Multiple Vitamin (MULTIVITAMIN WITH MINERALS) TABS Take 1 tablet by mouth daily.   Yes Historical Provider, MD  omeprazole (PRILOSEC) 20 MG capsule Take 20 mg by mouth daily.  11/17/12  Yes Michele Mcalpine, MD  polyethylene glycol [facility] / GLYCOLAX) packet Take 17 g by mouth daily as needed. For constipation   Yes Historical Provider, MD  senna-docusate (SENOKOT-S) 8.6-50 MG per tablet Take 1 tablet by mouth daily as needed. For constipation   Yes Historical Provider, MD  traMADol (ULTRAM) 50 MG tablet Take 1 tablet (50 mg total) by mouth every 6 (six) hours as needed for pain. 05/18/12  Yes Michele Mcalpine, MD    Review of Systems:  Constitutional:  No weight loss, night sweats, Fevers, chills, fatigue.  Head&Eyes: No headache.  No vision loss.  No eye pain or scotoma ENT:  No Difficulty swallowing,Tooth/dental problems,Sore throat,  No ear ache, post nasal drip,  Cardio-vascular:  No  Orthopnea, PND, swelling in lower extremities,  dizziness, palpitations  GI:  No  abdominal pain, nausea, vomiting, diarrhea, loss of appetite, hematochezia, melena, heartburn, indigestion, Resp: No cough. No coughing up of blood .No wheezing.No chest wall deformity  Skin:  no rash or lesions.  GU:  change in color of urine, no urgency or frequency. No flank pain.   Musculoskeletal:  No joint pain or swelling. No decreased range of motion. No  back pain.  Psych:  No change in mood or affect.  Neurologic: No headache, no dysesthesia, no focal weakness, no vision loss. No syncope  Physical Exam: Filed Vitals:   12/10/12 1632  BP: 130/75  Pulse: 77  Temp: 98.2 F (36.8 C)  TempSrc: Oral  Resp: 16  SpO2: 97%   General:  A&O x 3, NAD, nontoxic, pleasant/cooperative Head/Eye: No conjunctival hemorrhage, no icterus, [place]/AT, No nystagmus ENT:  No icterus,  No thrush, poor dentition, no pharyngeal exudate Neck:  No masses, no lymphadenpathy, no bruits CV:  RRR, no rub, no gallop, no S3 Lung:  CTAB, good air movement, no wheeze, no rhonchi Abdomen: soft/NT, +BS, nondistended, no peritoneal signs Ext: No cyanosis, No rashes, No petechiae, No lymphangitis, trace lower extremity edema Neuro: CNII-XII intact, strength 4/5 in bilateral upper and lower extremities, no dysmetria  Labs on Admission:  Basic Metabolic Panel:  Recent Labs Lab 12/10/12 1705  NA 137  K 4.4  CL 103  CO2 25  GLUCOSE 135*  BUN 17  CREATININE 0.81  CALCIUM 9.7   Liver Function Tests: No results found for this basename: AST, ALT, ALKPHOS, BILITOT, PROT, ALBUMIN,  in the last 168 hours No results found for this basename: LIPASE, AMYLASE,  in the last 168 hours No results found for this basename: AMMONIA,  in the last 168 hours CBC:  Recent Labs Lab 12/10/12 1705  WBC 4.3  NEUTROABS 2.2  HGB 12.4  HCT 37.2  MCV 90.1  PLT 241   Cardiac Enzymes:  Recent Labs Lab 12/10/12 1705  TROPONINI <0.30   BNP: No components found with this basename: POCBNP,  CBG: No results found for this basename: GLUCAP,  in the last 168 hours  Radiological Exams on Admission: Dg Chest 2 View  12/10/2012   *RADIOLOGY REPORT*  Clinical Data: Short of breath and cough  CHEST - 2 VIEW  Comparison: 11/20/2012  Findings: Heart size is normal.  Lungs are clear.  Negative for heart failure or pneumonia.  No mass or effusion is present.  IMPRESSION: No acute  abnormality.   Original Report Authenticated By: Janeece Riggers, M.D.    EKG: Independently reviewed. Sinus rhythm, nonspecific ST elevation V1 to V2    Time spent:70 minutes Code Status:   full Family Communication:   Family at bedside   Roe Koffman, DO  [facility] Pager [phone]  If 7PM-7AM, please contact night-coverage [email or web address] Password [facility] 12/10/2012, 7:40 PM

## 2012-12-10 NOTE — ED Provider Notes (Signed)
History     CSN: [id number]  Arrival date & time 12/10/12  1621   First MD Initiated Contact with Patient 12/10/12 1630      Chief Complaint  Patient presents with  . Shortness of Breath  . left sided pain and increased weakness   . Cough     The history is provided by the patient.   patient reports intermittent left-sided chest pain that last for several minutes and then subsides over the past several days.  She has had chills at home as well as a small cough.  She denies a history of cardiac disease.  No history of pulmonary embolism.  No unilateral leg swelling.  She's never had pain like this before.  Today she had several episodes of discomfort in her chest and this concerned her and that she can emergency department.  She does take an 81 mg aspirin every day.  She has a history of hypertension and hypercholesterolemia.  No history of diabetes.  She does not smoke cigarettes.  Past Medical History  Diagnosis Date  . Allergic rhinitis   . Acute asthmatic bronchitis   . Hypertension   . Hypercholesteremia   . GERD (gastroesophageal reflux disease)   . Diverticulosis of colon   . IBS (irritable bowel syndrome)   . History of colonic polyps   . UTI (lower urinary tract infection)   . DJD (degenerative joint disease)   . Lumbar back pain   . Lacunar infarction   . Vitamin D deficiency   . Anxiety     Past Surgical History  Procedure Laterality Date  . Abdominal hysterectomy      History reviewed. No pertinent family history.  History  Substance Use Topics  . Smoking status: Never Smoker   . Smokeless tobacco: Not on file  . Alcohol Use: No    OB History   Grav Para Term Preterm Abortions TAB SAB Ect Mult Living                  Review of Systems  All other systems reviewed and are negative.    Allergies  Telithromycin  Home Medications   Current Outpatient Rx  Name  Route  Sig  Dispense  Refill  . albuterol (PROVENTIL HFA) 108 (90 BASE) MCG/ACT  inhaler   Inhalation   Inhale 2 puffs into the lungs every 6 (six) hours as needed. For shortness of breath   1 Inhaler   11   . ALPRAZolam (XANAX) 0.25 MG tablet   Oral   Take 1 tablet (0.25 mg total) by mouth 3 (three) times daily as needed. for anxiety   90 tablet   5   . aspirin EC 81 MG tablet   Oral   Take 81 mg by mouth daily.         Marland Kitchen atorvastatin (LIPITOR) 80 MG tablet   Oral   Take 80 mg by mouth daily.         . budesonide-formoterol (SYMBICORT) 80-4.5 MCG/ACT inhaler   Inhalation   Inhale 2 puffs into the lungs 2 (two) times daily as needed. For shortness of breath   1 Inhaler   0   . Calcium Carbonate-Vitamin D (CALTRATE 600+D) 600-400 MG-UNIT per tablet   Oral   Take 1 tablet by mouth daily. For bone health         . Cholecalciferol (VITAMIN D) 1000 UNITS capsule   Oral   Take 1,000 Units by mouth daily.           Marland Kitchen  fish oil-omega-3 fatty acids 1000 MG capsule   Oral   Take 1 g by mouth 2 (two) times daily.          Marland Kitchen losartan (COZAAR) 100 MG tablet   Oral   Take 100 mg by mouth daily.         . Multiple Vitamin (MULTIVITAMIN WITH MINERALS) TABS   Oral   Take 1 tablet by mouth daily.         Marland Kitchen omeprazole (PRILOSEC) 20 MG capsule   Oral   Take 20 mg by mouth daily.          . polyethylene glycol (MIRALAX / GLYCOLAX) packet   Oral   Take 17 g by mouth daily as needed. For constipation         . senna-docusate (SENOKOT-S) 8.6-50 MG per tablet   Oral   Take 1 tablet by mouth daily as needed. For constipation         . traMADol (ULTRAM) 50 MG tablet   Oral   Take 1 tablet (50 mg total) by mouth every 6 (six) hours as needed for pain.   100 tablet   6     BP 130/75  Pulse 77  Temp(Src) 98.2 F (36.8 C) (Oral)  Resp 16  SpO2 97%  Physical Exam  Nursing note and vitals reviewed. Constitutional: She is oriented to person, place, and time. She appears well-developed and well-nourished. No distress.  HENT:  Head:  Normocephalic and atraumatic.  Eyes: EOM are normal.  Neck: Normal range of motion.  Cardiovascular: Normal rate, regular rhythm and normal heart sounds.   Pulmonary/Chest: Effort normal and breath sounds normal. She exhibits no tenderness.  Abdominal: Soft. She exhibits no distension. There is no tenderness.  Musculoskeletal: Normal range of motion.  Neurological: She is alert and oriented to person, place, and time.  Skin: Skin is warm and dry.  Psychiatric: She has a normal mood and affect. Judgment normal.    ED Course  Procedures (including critical care time)   Date: 12/10/2012  Rate: 79  Rhythm: normal sinus rhythm  QRS Axis: normal  Intervals: normal  ST/T Wave abnormalities: normal  Conduction Disutrbances: none  Narrative Interpretation:   Old EKG Reviewed: No significant changes noted     Labs Reviewed  CBC WITH DIFFERENTIAL - Abnormal; Notable for the following:    Monocytes Relative 13 (*)    All other components within normal limits  BASIC METABOLIC PANEL - Abnormal; Notable for the following:    Glucose, Bld 135 (*)    GFR calc non Af Amer 62 (*)    GFR calc Af Amer 71 (*)    All other components within normal limits  TROPONIN I   Dg Chest 2 View  12/10/2012   *RADIOLOGY REPORT*  Clinical Data: Short of breath and cough  CHEST - 2 VIEW  Comparison: 11/20/2012  Findings: Heart size is normal.  Lungs are clear.  Negative for heart failure or pneumonia.  No mass or effusion is present.  IMPRESSION: No acute abnormality.   Original Report Authenticated By: Janeece Riggers, M.D.   I personally reviewed the imaging tests through PACS system I reviewed available ER/hospitalization records through the EMR   No diagnosis found.    MDM  Given patient's intermittent left-sided chest pain with some associated shortness of breath this could represent angina.  EKG and troponin are normal.  The hospital for serial enzymes.        Vania Rea  Patria Mane, MD 12/10/12  [phone]

## 2012-12-10 NOTE — Telephone Encounter (Signed)
Per SN--  SN is out of the office and the other 2 providers that can see her are booked solid.  SN recs for the pt to be seen in the ER to make sure she does not have PNA at this time.  i called and spoke with pt and she stated that she will go to the ER and she has someone there to take her now. Nothing further is needed

## 2012-12-10 NOTE — Telephone Encounter (Signed)
Called and spoke with pt and she stated that she has been sick x 2 days.  Has a bad pain in her left side along her ribs and she has been coughing up green sputum.  Pt denies any fever but has been having chills.  Pt is aware that SN is not seeing pts today or tomorrow and TP is booked.  SN please advise thanks  Allergies  Allergen Reactions  . Telithromycin Hives    REACTION: pt states HIVES.Marland KitchenMarland KitchenMarland Kitchen

## 2012-12-11 DIAGNOSIS — J209 Acute bronchitis, unspecified: Secondary | ICD-10-CM

## 2012-12-11 DIAGNOSIS — R9431 Abnormal electrocardiogram [ECG] [EKG]: Secondary | ICD-10-CM

## 2012-12-11 DIAGNOSIS — J309 Allergic rhinitis, unspecified: Secondary | ICD-10-CM

## 2012-12-11 DIAGNOSIS — R079 Chest pain, unspecified: Secondary | ICD-10-CM

## 2012-12-11 LAB — BASIC METABOLIC PANEL
CO2: 30 mEq/L (ref 19–32)
Glucose, Bld: 86 mg/dL (ref 70–99)
Potassium: 3.9 mEq/L (ref 3.5–5.1)
Sodium: 139 mEq/L (ref 135–145)

## 2012-12-11 LAB — TROPONIN I: Troponin I: <0.3 ng/mL (ref <0.30)

## 2012-12-11 MED ORDER — ENSURE COMPLETE PO LIQD
237.0000 mL | ORAL | Status: DC
  Administered 2012-12-12 – 2012-12-14 (×3): 237 mL via ORAL

## 2012-12-11 MED ORDER — ENSURE COMPLETE PO LIQD: 237.0000 mL | Freq: Two times a day (BID) | ORAL | Status: DC

## 2012-12-11 MED ORDER — BOOST / RESOURCE BREEZE PO LIQD
1.0000 | ORAL | Status: DC
  Administered 2012-12-12 – 2012-12-14 (×3): 1 via ORAL

## 2012-12-11 NOTE — Progress Notes (Signed)
INITIAL NUTRITION ASSESSMENT  DOCUMENTATION CODES Per approved criteria  -Non-severe (moderate) malnutrition in the context of chronic illness  Pt meets criteria for moderate MALNUTRITION in the context of Chronic illness as evidenced by 8% wt loss in less than one month and moderate muscle and fat wasting per nutrition focused physical exam,   INTERVENTION: Provide Ensure Complete BID Encourage PO intake  NUTRITION DIAGNOSIS: Inadequate oral intake related to decreased appetite as evidenced by 8% wt loss in less than one month   Goal: Pt to meet >/= 90% of their estimated nutrition needs  Monitor:  PO intake Weight Labs  Reason for Assessment: Malnutrition Screening tool, score of 3  79 y.o. female  Admitting Dx: Chest Pain  ASSESSMENT: 79 year old female with a history of hypertension, hyperlipidemia, GERD, recurrent infarct presents with one-day history of worsening left-sided chest pain. The patient states that she has had left-sided chest discomfort for a number of years, but she came to the emergency department today because of the pain "hurting harder". Of note, the patient states that she has had decreased exercise tolerance, increasing dyspnea on exertion for the past 6 months. Pt reports that she has had a decreased appetite and been eating less than usual for the past 8 days. Pt states that she weighed 165 lbs one year ago, which was her usual weight but, per weight history pt weighed 152 lbs one year ago. Pt has lost 11 lbs in the past 3 weeks per weight history. Pt reports that she usually eats 3 meals daily and follows a carb modified, low fat, 2 gram sodium diet at home. Pt ate most of her breakfast this morning and states she ate more than she had been PTA. Encouraged PO intake with protein-rich foods daily.   Height: Ht Readings from Last 1 Encounters:  12/10/12 5\' 3"  (1.6 m)    Weight: Wt Readings from Last 1 Encounters:  12/10/12 127 lb 8 oz (57.834 kg)     Ideal Body Weight: 115 lbs  % Ideal Body Weight: 110%  Wt Readings from Last 10 Encounters:  12/10/12 127 lb 8 oz (57.834 kg)  11/20/12 138 lb 12.8 oz (62.959 kg)  07/14/12 142 lb 12.8 oz (64.774 kg)  05/18/12 144 lb (65.318 kg)  01/14/12 144 lb 9.6 oz (65.59 kg)  12/03/11 152 lb 3.2 oz (69.037 kg)  07/16/11 153 lb 6.4 oz (69.582 kg)  01/10/11 153 lb 3.2 oz (69.491 kg)  07/10/10 151 lb (68.493 kg)  04/09/10 150 lb 4 oz (68.153 kg)    Usual Body Weight: 150 lbs ( per weight history)  % Usual Body Weight: 92%  BMI:  Body mass index is 22.59 kg/(m^2).  Estimated Nutritional Needs: Kcal: 1390-1600  Protein: 58-70 grams Fluid: 1.7 L  Skin: dry, intact  Nutrition Focused Physical Exam:  Subcutaneous Fat:  Orbital Region: wnl Upper Arm Region: moderate wasting Thoracic and Lumbar Region: NA  Muscle:  Temple Region: severe wasting Clavicle Bone Region: moderate wasting Clavicle and Acromion Bone Region: moderate wasting Scapular Bone Region: NA Dorsal Hand: mild wasting Patellar Region: moderate wasting Anterior Thigh Region: moderate wasting Posterior Calf Region: mild wasting  Edema: none  Diet Order: Cardiac  EDUCATION NEEDS: -No education needs identified at this time   Intake/Output Summary (Last 24 hours) at 12/11/12 1156 Last data filed at 12/11/12 1130  Gross per 24 hour  Intake    360 ml  Output    625 ml  Net   -265 ml  Last BM: 6/18  Labs:   Recent Labs Lab 12/10/12 1705 12/11/12 0540  NA 137 139  K 4.4 3.9  CL 103 105  CO2 25 30  BUN 17 15  CREATININE 0.81 0.78  CALCIUM 9.7 9.3  GLUCOSE 135* 86    CBG (last 3)  No results found for this basename: GLUCAP,  in the last 72 hours  Scheduled Meds: . aspirin EC  325 mg Oral Daily  . atorvastatin  80 mg Oral Daily  . budesonide-formoterol  2 puff Inhalation BID  . calcium-vitamin D  2 tablet Oral Daily  . cholecalciferol  1,000 Units Oral Daily  . enoxaparin (LOVENOX)  injection  40 mg Subcutaneous Q24H  . losartan  100 mg Oral Daily  . multivitamin with minerals  1 tablet Oral Daily  . omega-3 acid ethyl esters  1 g Oral BID  . pantoprazole  40 mg Oral Daily  . polyethylene glycol  17 g Oral Daily  . senna-docusate  1 tablet Oral QHS  . sodium chloride  3 mL Intravenous Q12H    Continuous Infusions:   Past Medical History  Diagnosis Date  . Allergic rhinitis   . Acute asthmatic bronchitis   . Hypertension   . Hypercholesteremia   . GERD (gastroesophageal reflux disease)   . Diverticulosis of colon   . IBS (irritable bowel syndrome)   . History of colonic polyps   . UTI (lower urinary tract infection)   . DJD (degenerative joint disease)   . Lumbar back pain   . Lacunar infarction   . Vitamin D deficiency   . Anxiety     Past Surgical History  Procedure Laterality Date  . Abdominal hysterectomy      Ian Malkin RD, LDN Inpatient Clinical Dietitian Pager: [phone] After Hours Pager: [phone]

## 2012-12-11 NOTE — Progress Notes (Addendum)
UR completed by [facility] ED PM CM

## 2012-12-11 NOTE — Care Management Note (Addendum)
    Page 1 of 1   12/14/2012     3:02:38 PM   CARE MANAGEMENT NOTE 12/14/2012  Patient:  Kathryn Whitaker, Kathryn Whitaker   Account Number:  [id number]  Date Initiated:  12/11/2012  Documentation initiated by:  Lanier Clam  Subjective/Objective Assessment:   ADMITTED W/SOB.     Action/Plan:   FROM HOME ALONE.HAS PCP,PHARMACY.   Anticipated DC Date:  12/14/2012   Anticipated DC Plan:  SKILLED NURSING FACILITY  In-house referral  Clinical Social Worker      DC Planning Services  CM consult  [facility]      Choice offered to / List presented to:  C-1 Patient           Status of service:  Completed, signed off Medicare Important Message given?   (If response is "NO", the following Medicare IM given date fields will be blank) Date Medicare IM given:   Date Additional Medicare IM given:    Discharge Disposition:  SKILLED NURSING FACILITY  Per UR Regulation:  Reviewed for med. necessity/level of care/duration of stay  If discussed at Long Length of Stay Meetings, dates discussed:    Comments:  12-14-12 Lorenda Ishihara RN CM 1200 Long discussion with patient regarding d/c plans. Patient lives alone and does not have any assistance at home until next week. Interested in SNF for short term for rehab. Contacted CSW to assist with d/c.  12/11/12 [name] RN,BSN NCM [phone] PROVIDED W/HHC AGENCY LIST AS RESOURCE, & [facility].WOULD RECOMMEND PT/OT CONS.

## 2012-12-11 NOTE — Consult Note (Signed)
Referring Physician: Dr. Izola Price Primary Physician: Dr. Kriste Basque Primary Cardiologist: None Reason for Consultation: Chest Pain   HPI:  Kathryn Whitaker is a 79 year old female with a history of hypertension, hyperlipidemia, GERD, macular degeneration but no past cardiac history other than having a heart murmur.  At baseline she seems to be very functional and even continues to drive a bit. According to Dr. Jodelle Green notes she has frequent somatic complaints. She is a somewhat of a winding historian.   Apparently she hasn't felt well for about 10 days. Recently treated for acute bronchitis. Over past few days has felt run down and weak. Also says she has been very cold and has taken to wearing 4 shirts at home. No frank fevers. Yesterday she started coughing and had some green sputum. At same time she had left sided CP. She has had weakness and some dyspnea on exertion. Denies CP with activity. No edema, orthopnea or PND. Says she has lost about 20 pounds over past few months.  Since admit. No further CP or coughing. Troponin -negative x 2. ECG: NSR 79 Q waves v1 and v2 with isolated ST elevation in V2. No change from previous   Review of Systems:     Cardiac Review of Systems: {Y] = yes [ ]  = no  Chest Pain [    ]  Resting SOB [   ] Exertional SOB  [ y ]  Orthopnea [  ]   Pedal Edema [   ]    Palpitations [  ] Syncope  [  ]   Presyncope [   ]  General Review of Systems: [Y] = yes [  ]=no Constitional: recent weight change Cove.Etienne  ]; anorexia [  ]; fatigue Cove.Etienne  ]; nausea [  ]; night sweats [  ]; fever [  ]; or chills [  ];                                                                                                                                          Dental: poor dentition[  ];   Eye : blurred vision [  ]; diplopia [   ]; vision changes [  ];  Amaurosis fugax[  ]; Resp: cough Cove.Etienne  ];  wheezing[  ];  hemoptysis[  ]; shortness of breath[  ]; paroxysmal nocturnal dyspnea[  ]; dyspnea on exertion[ y ];  or orthopnea[  ];  GI:  gallstones[  ], vomiting[  ];  dysphagia[  ]; melena[  ];  hematochezia [  ]; heartburn[  ];   GU: kidney stones [  ]; hematuria[  ];   dysuria [  ];  nocturia[  ];  history of     obstruction [  ];                 Skin: rash, swelling[  ];, hair loss[  ];  peripheral edema[  ];  or itching[  ]; Musculosketetal: myalgias[ y ];  joint swelling[  ];  joint erythema[  ];  joint pain[  ];  back pain[  ];  Heme/Lymph: bruising[  ];  bleeding[  ];  anemia[  ];  Neuro: TIA[  ];  headaches[  ];  stroke[  ];  vertigo[  ];  seizures[  ];   paresthesias[  ];  difficulty walking[  ];  Psych:depression[  ]; anxiety[  ];  Endocrine: diabetes[  ];  thyroid dysfunction[  ];  Other:  Past Medical History  Diagnosis Date  . Allergic rhinitis   . Acute asthmatic bronchitis   . Hypertension   . Hypercholesteremia   . GERD (gastroesophageal reflux disease)   . Diverticulosis of colon   . IBS (irritable bowel syndrome)   . History of colonic polyps   . UTI (lower urinary tract infection)   . DJD (degenerative joint disease)   . Lumbar back pain   . Lacunar infarction   . Vitamin D deficiency   . Anxiety     Medications Prior to Admission  Medication Sig Dispense Refill  . albuterol (PROVENTIL HFA) 108 (90 BASE) MCG/ACT inhaler Inhale 2 puffs into the lungs every 6 (six) hours as needed. For shortness of breath  1 Inhaler  11  . ALPRAZolam (XANAX) 0.25 MG tablet Take 1 tablet (0.25 mg total) by mouth 3 (three) times daily as needed. for anxiety  90 tablet  5  . aspirin EC 81 MG tablet Take 81 mg by mouth daily.      Marland Kitchen atorvastatin (LIPITOR) 80 MG tablet Take 80 mg by mouth daily.      . budesonide-formoterol (SYMBICORT) 80-4.5 MCG/ACT inhaler Inhale 2 puffs into the lungs 2 (two) times daily as needed. For shortness of breath  1 Inhaler  0  . Calcium Carbonate-Vitamin D (CALTRATE 600+D) 600-400 MG-UNIT per tablet Take 1 tablet by mouth daily. For bone health      .  Cholecalciferol (VITAMIN D) 1000 UNITS capsule Take 1,000 Units by mouth daily.        . fish oil-omega-3 fatty acids 1000 MG capsule Take 1 g by mouth 2 (two) times daily.       Marland Kitchen losartan (COZAAR) 100 MG tablet Take 100 mg by mouth daily.      . Multiple Vitamin (MULTIVITAMIN WITH MINERALS) TABS Take 1 tablet by mouth daily.      Marland Kitchen omeprazole (PRILOSEC) 20 MG capsule Take 20 mg by mouth daily.       . polyethylene glycol (MIRALAX / GLYCOLAX) packet Take 17 g by mouth daily as needed. For constipation      . senna-docusate (SENOKOT-S) 8.6-50 MG per tablet Take 1 tablet by mouth daily as needed. For constipation      . traMADol (ULTRAM) 50 MG tablet Take 1 tablet (50 mg total) by mouth every 6 (six) hours as needed for pain.  100 tablet  6     . aspirin EC  325 mg Oral Daily  . atorvastatin  80 mg Oral Daily  . budesonide-formoterol  2 puff Inhalation BID  . calcium-vitamin D  2 tablet Oral Daily  . cholecalciferol  1,000 Units Oral Daily  . enoxaparin (LOVENOX) injection  40 mg Subcutaneous Q24H  . [START ON 12/12/2012] feeding supplement  237 mL Oral Q24H  . [START ON 12/12/2012] feeding supplement  1 Container Oral Q24H  . losartan  100 mg Oral Daily  .  multivitamin with minerals  1 tablet Oral Daily  . omega-3 acid ethyl esters  1 g Oral BID  . pantoprazole  40 mg Oral Daily  . polyethylene glycol  17 g Oral Daily  . senna-docusate  1 tablet Oral QHS  . sodium chloride  3 mL Intravenous Q12H    Infusions:    Allergies  Allergen Reactions  . Demerol (Meperidine)     Pt states she can't remember type of reaction  . Influenza Vaccines     Pt can't remember reaction but states allergic and has not had in 25 years  . Telithromycin Hives    REACTION: pt states HIVES....    History   Social History  . Marital Status: Widowed    Spouse Name: N/A    Number of Children: 6  . Years of Education: N/A   Occupational History  . Not on file.   Social History Main Topics  .  Smoking status: Never Smoker   . Smokeless tobacco: Never Used  . Alcohol Use: No  . Drug Use: No  . Sexually Active: Not on file   Other Topics Concern  . Not on file   Social History Narrative  . No narrative on file    History reviewed. No pertinent family history.  PHYSICAL EXAM: Filed Vitals:   12/11/12 1345  BP: 118/56  Pulse: 64  Temp: 98 F (36.7 C)  Resp: 16     Intake/Output Summary (Last 24 hours) at 12/11/12 1643 Last data filed at 12/11/12 1350  Gross per 24 hour  Intake    600 ml  Output   1075 ml  Net   -475 ml    General:  Well appearing. No respiratory difficulty. Looks younger than stated age HEENT: normal Neck: supple. no JVD. Carotids 2+ bilat; no bruits. No lymphadenopathy or thryomegaly appreciated. Cor: PMI nondisplaced. Regular rate & rhythm. No rubs, gallops or murmurs. Lungs: clear Abdomen: soft, nontender, nondistended. No hepatosplenomegaly. No bruits or masses. Good bowel sounds. Extremities: no cyanosis, clubbing, rash, edema Neuro: alert & oriented x 3, cranial nerves grossly intact. moves all 4 extremities w/o difficulty. Affect pleasant.  ECG: as per HPI  Results for orders placed during the hospital encounter of 12/10/12 (from the past 24 hour(s))  CBC WITH DIFFERENTIAL     Status: Abnormal   Collection Time    12/10/12  5:05 PM      Result Value Range   WBC 4.3  4.0 - 10.5 K/uL   RBC 4.13  3.87 - 5.11 MIL/uL   Hemoglobin 12.4  12.0 - 15.0 g/dL   HCT 16.1  09.6 - 04.5 %   MCV 90.1  78.0 - 100.0 fL   MCH 30.0  26.0 - 34.0 pg   MCHC 33.3  30.0 - 36.0 g/dL   RDW 40.9  81.1 - 91.4 %   Platelets 241  150 - 400 K/uL   Neutrophils Relative % 53  43 - 77 %   Neutro Abs 2.2  1.7 - 7.7 K/uL   Lymphocytes Relative 32  12 - 46 %   Lymphs Abs 1.4  0.7 - 4.0 K/uL   Monocytes Relative 13 (*) 3 - 12 %   Monocytes Absolute 0.6  0.1 - 1.0 K/uL   Eosinophils Relative 1  0 - 5 %   Eosinophils Absolute 0.1  0.0 - 0.7 K/uL   Basophils  Relative 1  0 - 1 %   Basophils Absolute 0.1  0.0 -  0.1 K/uL  BASIC METABOLIC PANEL     Status: Abnormal   Collection Time    12/10/12  5:05 PM      Result Value Range   Sodium 137  135 - 145 mEq/L   Potassium 4.4  3.5 - 5.1 mEq/L   Chloride 103  96 - 112 mEq/L   CO2 25  19 - 32 mEq/L   Glucose, Bld 135 (*) 70 - 99 mg/dL   BUN 17  6 - 23 mg/dL   Creatinine, Ser 7.25  0.50 - 1.10 mg/dL   Calcium 9.7  8.4 - 36.6 mg/dL   GFR calc non Af Amer 62 (*) >90 mL/min   GFR calc Af Amer 71 (*) >90 mL/min  TROPONIN I     Status: None   Collection Time    12/10/12  5:05 PM      Result Value Range   Troponin I <0.30  <0.30 ng/mL  URINALYSIS W MICROSCOPIC + REFLEX CULTURE     Status: Abnormal   Collection Time    12/10/12 10:49 PM      Result Value Range   Color, Urine YELLOW  YELLOW   APPearance CLEAR  CLEAR   Specific Gravity, Urine 1.020  1.005 - 1.030   pH 5.5  5.0 - 8.0   Glucose, UA NEGATIVE  NEGATIVE mg/dL   Hgb urine dipstick NEGATIVE  NEGATIVE   Bilirubin Urine NEGATIVE  NEGATIVE   Ketones, ur NEGATIVE  NEGATIVE mg/dL   Protein, ur NEGATIVE  NEGATIVE mg/dL   Urobilinogen, UA 0.2  0.0 - 1.0 mg/dL   Nitrite NEGATIVE  NEGATIVE   Leukocytes, UA SMALL (*) NEGATIVE   WBC, UA 3-6  <3 WBC/hpf   RBC / HPF 0-2  <3 RBC/hpf   Bacteria, UA RARE  RARE   Squamous Epithelial / LPF RARE  RARE  TROPONIN I     Status: None   Collection Time    12/10/12 11:15 PM      Result Value Range   Troponin I <0.30  <0.30 ng/mL  BASIC METABOLIC PANEL     Status: Abnormal   Collection Time    12/11/12  5:40 AM      Result Value Range   Sodium 139  135 - 145 mEq/L   Potassium 3.9  3.5 - 5.1 mEq/L   Chloride 105  96 - 112 mEq/L   CO2 30  19 - 32 mEq/L   Glucose, Bld 86  70 - 99 mg/dL   BUN 15  6 - 23 mg/dL   Creatinine, Ser 4.40  0.50 - 1.10 mg/dL   Calcium 9.3  8.4 - 34.7 mg/dL   GFR calc non Af Amer 71 (*) >90 mL/min   GFR calc Af Amer 82 (*) >90 mL/min  TROPONIN I     Status: None   Collection  Time    12/11/12  5:40 AM      Result Value Range   Troponin I <0.30  <0.30 ng/mL   Dg Chest 2 View  12/10/2012   *RADIOLOGY REPORT*  Clinical Data: Short of breath and cough  CHEST - 2 VIEW  Comparison: 11/20/2012  Findings: Heart size is normal.  Lungs are clear.  Negative for heart failure or pneumonia.  No mass or effusion is present.  IMPRESSION: No acute abnormality.   Original Report Authenticated By: Janeece Riggers, M.D.     ASSESSMENT:  1. CP with abnormal ECG 2. HTN 3. HL 4. Cough 5. Unintentional  weight loss  PLAN/DISCUSSION:  Her chest pain seems mostly atypical and is occurring in the setting of what appears to be an overall general decline. Her ECG is mildly abnormal but is unchanged from previous and troponins are negative. We discussed the possibility of a stress test but if it were positive we would lean toward cath but I am not sure the risk/benefit ratio of a cath would be in her favor at this point given her age and overall decline.   I think the best plan is to start with an echo. If this is normal and her symptoms do not recur then I would not pursue any further cardiac work-up. If echo is abnormal or she has significant recurrent symptoms then could consider Lexiscan Myoview.   Truman Hayward 5:26 PM

## 2012-12-11 NOTE — Progress Notes (Signed)
Patient ID: Kathryn Whitaker, female   DOB: 1919/10/10, 79 y.o.   MRN: [id number] [facility] PROGRESS NOTE  Kathryn Whitaker GEX:[id number] DOB: 1919/08/24 DOA: 12/10/2012 PCP: Michele Mcalpine, MD  Brief narrative: 79 year old female with a history of hypertension, hyperlipidemia, GERD, recurrent infarct presents with one-day history of worsening left-sided chest pain. The patient states that she has had left-sided chest discomfort for a number of years, but she came to the emergency department because of the pain "hurting harder". She describes it as a sharp stabbing pain that lasted approximately 1 minute without any radiation to the arm or jaw. She states that the pain can come and go at rest as well as with exertion. Of note, the patient states that she has had decreased exercise tolerance, increasing dyspnea on exertion for the past 6 months. She notes that she does have increasing difficulty with the 12 steps that she has a house. The patient is very independent and performs all her instrumental ADLs. However over the past 6 months she has noted increasing frequency of her chest discomfort and pain as well as at rest with increasing dyspnea on exertion. She denies any dizziness, diaphoresis, nausea, vomiting, diarrhea. Has not had any fevers or chills. Most recently, the patient was treated with Omnicef on 11/20/2012 for bronchitis. She denies any syncope, focal extremity weakness, visual disturbance, dysarthria. She is compliant with her aspirin 81 mg daily.   In the emergency department, the patient had unremarkable CBC and BMP. Troponin was negative x1. EKG showed nonspecific ST elevation in V1 to V2, but this was unchanged when compared to previous EKGs. Chest x-ray was clear.   Assessment/Plan:  Chest discomfort/Angina  - Patient has typical and atypical features  - 3 sets of cardiac enzymes are negative, pt still has pain  - will ask cardiology to help [place] in management of this pleasant  pt - continue aspirin and statin for now, monitor on telemetry  Hypertension  - Continue losartan, reasonable inpatient control   Hyperlipidemia  - Continue atorvastatin  Anxiety  - Continue Xanax 0.25 mg 3 times a day when necessary anxiety  GERD  - Continue PPI  Dysuria  - urinalysis unremarkable, pt afebrile, no leukocytosis, pt denies dysuria this AM  Consultants:  Cardiology   Procedures/Studies: Dg Chest 2 View 12/10/2012  No acute abnormality.   Antibiotics:  None  Code Status: Full Family Communication: Pt at bedside Disposition Plan: Home when medically stable  HPI/Subjective: No events overnight.   Objective: Filed Vitals:   12/11/12 0459 12/11/12 0801 12/11/12 0844 12/11/12 1345  BP: 115/64  122/68 118/56  Pulse: 51  60 64  Temp: 98.1 F (36.7 C)   98 F (36.7 C)  TempSrc: Oral   Oral  Resp: 16   16  Height:      Weight:      SpO2: 98% 94%  97%    Intake/Output Summary (Last 24 hours) at 12/11/12 1644 Last data filed at 12/11/12 1350  Gross per 24 hour  Intake    600 ml  Output   1075 ml  Net   -475 ml    Exam:   General:  Pt is alert, follows commands appropriately, not in acute distress  Cardiovascular: Regular rhythm, bradycardia, S1/S2, no murmurs, no rubs, no gallops  Respiratory: Clear to auscultation bilaterally, no wheezing, no crackles, no rhonchi  Abdomen: Soft, non tender, non distended, bowel sounds present, no guarding  Extremities: No edema, pulses DP and PT palpable  bilaterally  Neuro: Grossly nonfocal  Data Reviewed: Basic Metabolic Panel:  Recent Labs Lab 12/10/12 1705 12/11/12 0540  NA 137 139  K 4.4 3.9  CL 103 105  CO2 25 30  GLUCOSE 135* 86  BUN 17 15  CREATININE 0.81 0.78  CALCIUM 9.7 9.3   CBC:  Recent Labs Lab 12/10/12 1705  WBC 4.3  NEUTROABS 2.2  HGB 12.4  HCT 37.2  MCV 90.1  PLT 241   Cardiac Enzymes:  Recent Labs Lab 12/10/12 1705 12/10/12 2315 12/11/12 0540  TROPONINI <0.30  <0.30 <0.30    Scheduled Meds: . aspirin EC  325 mg Oral Daily  . atorvastatin  80 mg Oral Daily  . budesonide-formoterol  2 puff Inhalation BID  . calcium-vitamin D  2 tablet Oral Daily  . cholecalciferol  1,000 Units Oral Daily  . enoxaparin (LOVENOX) injection  40 mg Subcutaneous Q24H  . [START ON 12/12/2012] feeding supplement  237 mL Oral Q24H  . [START ON 12/12/2012] feeding supplement  1 Container Oral Q24H  . losartan  100 mg Oral Daily  . multivitamin with minerals  1 tablet Oral Daily  . omega-3 acid ethyl esters  1 g Oral BID  . pantoprazole  40 mg Oral Daily  . polyethylene glycol  17 g Oral Daily  . senna-docusate  1 tablet Oral QHS  . sodium chloride  3 mL Intravenous Q12H   Continuous Infusions:   Debbora Presto, MD  [facility] Pager [phone]  If 7PM-7AM, please contact night-coverage [email or web address] Password [email or web address] 12/11/2012, 4:44 PM   LOS: 1 day

## 2012-12-12 DIAGNOSIS — R079 Chest pain, unspecified: Secondary | ICD-10-CM | POA: Diagnosis present

## 2012-12-12 DIAGNOSIS — E785 Hyperlipidemia, unspecified: Secondary | ICD-10-CM

## 2012-12-12 DIAGNOSIS — I10 Essential (primary) hypertension: Secondary | ICD-10-CM

## 2012-12-12 DIAGNOSIS — E440 Moderate protein-calorie malnutrition: Secondary | ICD-10-CM | POA: Insufficient documentation

## 2012-12-12 DIAGNOSIS — I369 Nonrheumatic tricuspid valve disorder, unspecified: Secondary | ICD-10-CM

## 2012-12-12 NOTE — Progress Notes (Signed)
[facility] PROGRESS NOTE  Kathryn Whitaker WJX:[id number] DOB: 03/20/1920 DOA: 12/10/2012 PCP: Michele Mcalpine, MD  Brief narrative: 79 year old female with a history of hypertension, hyperlipidemia, GERD, recurrent infarct presents with one-day history of worsening left-sided chest pain.  In the emergency department, the patient had unremarkable CBC and BMP. Troponin was negative x1. EKG showed nonspecific ST elevation in V1 to V2, but this was unchanged when compared to previous EKGs. Chest x-ray was clear.   Assessment/Plan:   Principal Problem: Chest discomfort/Angina  - Patient with typical and atypical features  - cardiac enzymes so far negative - per cardiology we will follow up on 2 D ECHO - continue aspirin and statin for now, monitor on telemetry   Active Problems: Hypertension  - Continue losartan Hyperlipidemia  - Continue atorvastatin  Anxiety  - Continue Xanax 0.25 mg 3 times a day when necessary  GERD  - Continue PPI therapy  Code Status: full code Family Communication: no family at the bedside Disposition Plan: needs PT eval and then will decide on discharge plan  Manson Passey, MD  [facility] Pager [phone]  If 7PM-7AM, please contact night-coverage [email or web address] Password [email or web address] 12/12/2012, 10:47 AM   LOS: 2 days   Consultants:  Cardiology (Dr. Harriet Butte)  Procedures:  None   Antibiotics:  None   HPI/Subjective: Still with residual chest pain.  Objective: Filed Vitals:   12/11/12 1345 12/11/12 2138 12/12/12 0628 12/12/12 0824  BP: 118/56 99/41 108/56   Pulse: 64 53 57   Temp: 98 F (36.7 C) 98 F (36.7 C) 98.1 F (36.7 C)   TempSrc: Oral Oral    Resp: 16 20 20    Height:      Weight:      SpO2: 97% 98% 100% 96%    Intake/Output Summary (Last 24 hours) at 12/12/12 1047 Last data filed at 12/12/12 0600  Gross per 24 hour  Intake    480 ml  Output   1700 ml  Net  -1220 ml    Exam:   General:  Pt is alert, follows  commands appropriately, not in acute distress  Cardiovascular: Regular rate and rhythm, S1/S2, no murmurs, no rubs, no gallops  Respiratory: Clear to auscultation bilaterally, no wheezing, no crackles, no rhonchi  Abdomen: Soft, non tender, non distended, bowel sounds present, no guarding  Extremities: No edema, pulses DP and PT palpable bilaterally  Neuro: Grossly nonfocal  Data Reviewed: Basic Metabolic Panel:  Recent Labs Lab 12/10/12 1705 12/11/12 0540  NA 137 139  K 4.4 3.9  CL 103 105  CO2 25 30  GLUCOSE 135* 86  BUN 17 15  CREATININE 0.81 0.78  CALCIUM 9.7 9.3   Liver Function Tests: No results found for this basename: AST, ALT, ALKPHOS, BILITOT, PROT, ALBUMIN,  in the last 168 hours No results found for this basename: LIPASE, AMYLASE,  in the last 168 hours No results found for this basename: AMMONIA,  in the last 168 hours CBC:  Recent Labs Lab 12/10/12 1705  WBC 4.3  NEUTROABS 2.2  HGB 12.4  HCT 37.2  MCV 90.1  PLT 241   Cardiac Enzymes:  Recent Labs Lab 12/10/12 1705 12/10/12 2315 12/11/12 0540  TROPONINI <0.30 <0.30 <0.30   BNP: No components found with this basename: POCBNP,  CBG: No results found for this basename: GLUCAP,  in the last 168 hours  No results found for this or any previous visit (from the past 240 hour(s)).   Studies: Dg Chest 2 View  12/10/2012   *RADIOLOGY REPORT*  Clinical Data: Short of breath and cough  CHEST - 2 VIEW  Comparison: 11/20/2012  Findings: Heart size is normal.  Lungs are clear.  Negative for heart failure or pneumonia.  No mass or effusion is present.  IMPRESSION: No acute abnormality.   Original Report Authenticated By: Janeece Riggers, M.D.    Scheduled Meds: . aspirin EC  325 mg Oral Daily  . atorvastatin  80 mg Oral Daily  . budesonide-formoterol  2 puff Inhalation BID  . calcium-vitamin D  2 tablet Oral Daily  . cholecalciferol  1,000 Units Oral Daily  . enoxaparin (LOVENOX)   40 mg Subcutaneous  Q24H  . losartan  100 mg Oral Daily  . multivitamin  1 tablet Oral Daily  . omega-3 acid ethyl esters  1 g Oral BID  . pantoprazole  40 mg Oral Daily  . polyethylene glycol  17 g Oral Daily  . senna-docusate  1 tablet Oral QHS   Continuous Infusions:

## 2012-12-12 NOTE — Progress Notes (Signed)
  Echocardiogram 2D Echocardiogram has been performed.  Kathryn Whitaker 12/12/2012, 9:31 AM 

## 2012-12-12 NOTE — Progress Notes (Signed)
Patient ID: Kathryn Whitaker, female   DOB: March 30, 1920, 79 y.o.   MRN: [id number] SUBJECTIVE:  Patient has slight discomfort under her left breast during the night.   Filed Vitals:   12/11/12 0844 12/11/12 1345 12/11/12 2138 12/12/12 0628  BP: 122/68 118/56 99/41 108/56  Pulse: 60 64 53 57  Temp:  98 F (36.7 C) 98 F (36.7 C) 98.1 F (36.7 C)  TempSrc:  Oral Oral   Resp:  16 20 20   Height:      Weight:      SpO2:  97% 98% 100%    Intake/Output Summary (Last 24 hours) at 12/12/12 0751 Last data filed at 12/12/12 0600  Gross per 24 hour  Intake    840 ml  Output   1825 ml  Net   -985 ml    LABS: Basic Metabolic Panel:  Recent Labs  40/98/11 1705 12/11/12 0540  NA 137 139  K 4.4 3.9  CL 103 105  CO2 25 30  GLUCOSE 135* 86  BUN 17 15  CREATININE 0.81 0.78  CALCIUM 9.7 9.3   Liver Function Tests: No results found for this basename: AST, ALT, ALKPHOS, BILITOT, PROT, ALBUMIN,  in the last 72 hours No results found for this basename: LIPASE, AMYLASE,  in the last 72 hours CBC:  Recent Labs  12/10/12 1705  WBC 4.3  NEUTROABS 2.2  HGB 12.4  HCT 37.2  MCV 90.1  PLT 241   Cardiac Enzymes:  Recent Labs  12/10/12 1705 12/10/12 2315 12/11/12 0540  TROPONINI <0.30 <0.30 <0.30   BNP: No components found with this basename: POCBNP,  D-Dimer: No results found for this basename: DDIMER,  in the last 72 hours Hemoglobin A1C: No results found for this basename: HGBA1C,  in the last 72 hours Fasting Lipid Panel: No results found for this basename: CHOL, HDL, LDLCALC, TRIG, CHOLHDL, LDLDIRECT,  in the last 72 hours Thyroid Function Tests: No results found for this basename: TSH, T4TOTAL, FREET3, T3FREE, THYROIDAB,  in the last 72 hours  RADIOLOGY: Dg Chest 2 View  12/10/2012   *RADIOLOGY REPORT*  Clinical Data: Short of breath and cough  CHEST - 2 VIEW  Comparison: 11/20/2012  Findings: Heart size is normal.  Lungs are clear.  Negative for heart failure or  pneumonia.  No mass or effusion is present.  IMPRESSION: No acute abnormality.   Original Report Authenticated By: Janeece Riggers, M.D.   Dg Chest 2 View  11/20/2012   *RADIOLOGY REPORT*  Clinical Data: Asthma exacerbation shortness of breath.  CHEST - 2 VIEW  Comparison: Two-view chest x-ray 03/01/2012, 04/09/2010, 07/10/2009.  Findings: Cardiac silhouette upper normal in size for the AP semi- erect technique, unchanged.  Thoracic aorta tortuous and mildly atherosclerotic, unchanged.  Hilar and mediastinal contours otherwise unremarkable.  Lungs clear.  Bronchovascular markings normal.  Pulmonary vascularity normal.  No pneumothorax.  No pleural effusions.  Degenerative changes involving the thoracic spine and mild generalized osseous demineralization.  IMPRESSION: No acute cardiopulmonary disease.   Original Report Authenticated By: Hulan Saas, M.D.    PHYSICAL EXAM  patient is oriented this morning. Lungs are clear. Respiratory effort is nonlabored. Cardiac exam reveals S1 and S2. There is a 2/6 systolic murmur. There is no peripheral edema.   TELEMETRY:  I have reviewed telemetry today December 12, 2012. There is sinus rhythm with sinus bradycardia.   ASSESSMENT AND PLAN:    Dyspnea on exertion   ECG abnormal   Malnutrition of moderate degree  Chest pain      The complete cardiology consult note is recorded yesterday. The plan is for 2-D echo today. Then further decisions can be made about any possible further cardiac workup.   [name] 12/12/2012 7:51 AM

## 2012-12-13 MED ORDER — GUAIFENESIN ER 600 MG PO TB12
600.0000 mg | ORAL_TABLET | Freq: Two times a day (BID) | ORAL | Status: DC
  Administered 2012-12-13 – 2012-12-14 (×3): 600 mg via ORAL
  Filled 2012-12-13 (×4): qty 1

## 2012-12-13 NOTE — Progress Notes (Signed)
Gave pt a aresol chamber for her MDI.

## 2012-12-13 NOTE — Progress Notes (Addendum)
[facility] PROGRESS NOTE  Kathryn Whitaker ZOX:[id number] DOB: 06-18-1920 DOA: 12/10/2012 PCP: Michele Mcalpine, MD  Brief narrative: Kathryn Whitaker is an 79 y.o. female with a PMH of hypertension, hyperlipidemia, GERD who was admitted on 12/10/2012 with chest pain. Two-dimensional echocardiography confirmed an ejection fraction of 65% with no focal wall motion abnormalities. She's been evaluated by cardiology with no further cardiac workup recommended.  Assessment/Plan:  Principal Problem:  Atypical chest pain  -Cardiac enzymes cycled every 6 hours x3 sets, negative. -EKG with nonspecific ST elevation in V1-V2, unchanged from prior EKGs. -Two-dimensional echocardiogram done 12/12/2012 with normal LV function and no regional wall motion abnormalities. -No further cardiology evaluation recommended, status post inpatient evaluation by Dr. Myrtis Ser. Active Problems:  Hypertension  -Continue losartan.  Hyperlipidemia  -Continue atorvastatin.  Anxiety  -Continue Xanax 0.25 mg 3 times a day when necessary.  GERD  -Continue PPI therapy.   Code Status: Full code  Family Communication: No family at the bedside.  Disposition Plan: needs PT eval and then will decide on discharge plan.  Medical Consultants:  Dr. [name], Cardiology.  Other Consultants:  PT  Anti-infectives:  None.  HPI/Subjective: Kathryn Whitaker feels well.  Reports some flank/pleuritic pain, typical of her "chronic bronchitis".  No fevers. Appetite is good. No frank chest pain.  Objective: Filed Vitals:   12/12/12 1524 12/12/12 1556 12/12/12 2148 12/13/12 0653  BP: 87/61 140/70 105/49 99/54  Pulse: 76  57 61  Temp: 98.1 F (36.7 C)  98.1 F (36.7 C) 97.8 F (36.6 C)  TempSrc: Oral  Oral Oral  Resp: 19  16 20   Height:      Weight:      SpO2: 98%  97% 99%    Intake/Output Summary (Last 24 hours) at 12/13/12 0816 Last data filed at 12/12/12 2048  Gross per 24 hour  Intake    320 ml  Output    1250 ml  Net   -930 ml    Exam: Gen:  NAD Cardiovascular:  RRR, II/VI SEM Respiratory:  Lungs CTAB Gastrointestinal:  Abdomen soft, NT/ND, + BS Extremities:  No C/E/C  Data Reviewed: Basic Metabolic Panel:  Recent Labs Lab 12/10/12 1705 12/11/12 0540  NA 137 139  K 4.4 3.9  CL 103 105  CO2 25 30  GLUCOSE 135* 86  BUN 17 15  CREATININE 0.81 0.78  CALCIUM 9.7 9.3   GFR Estimated Creatinine Clearance: 37.9 ml/min (by C-G formula based on Cr of 0.78).  CBC:  Recent Labs Lab 12/10/12 1705  WBC 4.3  NEUTROABS 2.2  HGB 12.4  HCT 37.2  MCV 90.1  PLT 241   Cardiac Enzymes:  Recent Labs Lab 12/10/12 1705 12/10/12 2315 12/11/12 0540  TROPONINI <0.30 <0.30 <0.30   BNP (last 3 results)  Recent Labs  11/20/12 1300  PROBNP 169.2     Procedures and Diagnostic Studies: Dg Chest 2 View  12/10/2012   *RADIOLOGY REPORT*  Clinical Data: Short of breath and cough  CHEST - 2 VIEW  Comparison: 11/20/2012  Findings: Heart size is normal.  Lungs are clear.  Negative for heart failure or pneumonia.  No mass or effusion is present.  IMPRESSION: No acute abnormality.   Original Report Authenticated By: Janeece Riggers, M.D.    Scheduled Meds: . aspirin EC  325 mg Oral Daily  . atorvastatin  80 mg Oral Daily  . budesonide-formoterol  2 puff Inhalation BID  . calcium-vitamin D  2 tablet Oral Daily  .  cholecalciferol  1,000 Units Oral Daily  . enoxaparin (LOVENOX) injection  40 mg Subcutaneous Q24H  . feeding supplement  237 mL Oral Q24H  . feeding supplement  1 Container Oral Q24H  . losartan  100 mg Oral Daily  . multivitamin with minerals  1 tablet Oral Daily  . omega-3 acid ethyl esters  1 g Oral BID  . pantoprazole  40 mg Oral Daily  . polyethylene glycol  17 g Oral Daily  . senna-docusate  1 tablet Oral QHS  . sodium chloride  3 mL Intravenous Q12H   Continuous Infusions:   Time spent: 25 minutes.   LOS: 3 days   Kathryn Whitaker  [facility] Pager  [phone].   *Please note that the hospitalists switch teams on Wednesdays. Please call the flow manager at [phone] if you are having difficulty reaching the hospitalist taking care of this patient as she can update you and provide the most up-to-date pager number of provider caring for the patient. If 8PM-8AM, please contact night-coverage at [email or web address], password [facility]  12/13/2012, 8:16 AM

## 2012-12-13 NOTE — Evaluation (Signed)
Physical Therapy Evaluation Patient Details Name: Kathryn Whitaker MRN: [id number] DOB: [birth year] Today's Date: 12/13/2012 Time: 4098-1191 PT Time Calculation (min): 20 min  PT Assessment / Plan / Recommendation Clinical Impression  Pt presents with L chest pain and history of HLD, HTN and pt reported 6 months of DOE.  Tolerated OOB and ambulation in room without AD and noted decreased balance and stability, however ambulated in hallway with RW with increased stability noted.  Pt will benefit from skilled PT in acute venue to address deficits.  PT discussed D/C planning with pt and feel that she would benefit from [facility] and HHPT if aide can be provided 3-5 days/week for higher level activities like getting in shower, laundry, and going up/down stairs.  If [facility] aide not an option then would recommend [facility] SNF for follow up to allow pts balance and strength to increase before D/C home.     PT Assessment  Patient needs continued PT services    Follow Up Recommendations  Home health PT;SNF;Supervision/Assistance - 24 hour    Does the patient have the potential to tolerate intense rehabilitation      Barriers to Discharge Decreased caregiver support      Equipment Recommendations  Rolling walker with 5" wheels    Recommendations for Other Services     Frequency Min 3X/week    Precautions / Restrictions Precautions Precautions: Fall Restrictions Weight Bearing Restrictions: No   Pertinent Vitals/Pain No stated pain      Mobility  Bed Mobility Bed Mobility: Supine to Sit Supine to Sit: 5: Supervision Details for Bed Mobility Assistance: Supervision for safety.  Transfers Transfers: Sit to Stand;Stand to Sit Sit to Stand: 4: Min guard;With upper extremity assist;From bed Stand to Sit: 4: Min guard;With upper extremity assist;With armrests;To chair/3-in-1 Details for Transfer Assistance: Min/guard for safety with cues for hand placement.  Ambulation/Gait Ambulation/Gait  Assistance: 4: Min assist Ambulation Distance (Feet): 160 Feet (and another 15') Assistive device: None;Rolling walker Ambulation/Gait Assistance Details: Had pt ambulate to restroom without AD and noted pt to be unsteady and reaching for furniture and rails to assist and steady.  Provided pt with RW for ambulation in hallway with cues for maintaining position inside of RW and technique but did note increased stability.  Gait Pattern: Step-through pattern;Decreased stride length;Trunk flexed Gait velocity: decreased Stairs: No Wheelchair Mobility Wheelchair Mobility: No    Exercises     PT Diagnosis: Difficulty walking;Generalized weakness  PT Problem List: Decreased strength;Decreased activity tolerance;Decreased balance;Decreased mobility;Decreased coordination;Decreased knowledge of use of DME;Decreased safety awareness PT Treatment Interventions: DME instruction;Gait training;Stair training;Functional mobility training;Therapeutic activities;Therapeutic exercise;Balance training;Patient/family education   PT Goals Acute Rehab PT Goals PT Goal Formulation: With patient Time For Goal Achievement: 12/20/12 Potential to Achieve Goals: Good Pt will go Sit to Supine/Side: with modified independence PT Goal: Sit to Supine/Side - Progress: Goal set today Pt will go Sit to Stand: with modified independence PT Goal: Sit to Stand - Progress: Goal set today Pt will go Stand to Sit: with modified independence PT Goal: Stand to Sit - Progress: Goal set today Pt will Ambulate: >150 feet;with modified independence;with least restrictive assistive device PT Goal: Ambulate - Progress: Goal set today Pt will Go Up / Down Stairs: 6-9 stairs;with supervision;with rail(s);with least restrictive assistive device PT Goal: Up/Down Stairs - Progress: Goal set today Pt will Perform Home Exercise Program: with supervision, verbal cues required/provided PT Goal: Perform Home Exercise Program - Progress: Goal  set today  Visit Information  Last PT Received On: 12/13/12 Assistance Needed: +1    Subjective Data  Subjective: I would like to go back home if I could Patient Stated Goal: same   Prior Functioning  Home Living Lives With: Alone Type of Home: House Home Access: Stairs to enter Entrance Stairs-Number of Steps: 6 Entrance Stairs-Rails: Right Home Layout: Multi-level;Bed/bath upstairs Alternate Level Stairs-Number of Steps: bedroom upstairs, but states she can make main floor bed, has bathroom on mainlevel.  6 stairs to get upstairs then another 6 to bedrooms Alternate Level Stairs-Rails: Right Bathroom Shower/Tub: Engineer, manufacturing systems: Standard Home Adaptive Equipment: None Prior Function Level of Independence: Independent Able to Take Stairs?: Yes Driving: Yes Vocation: Retired Musician: No difficulties    Copywriter, advertising Arousal/Alertness: Awake/alert Behavior During Therapy: WFL for tasks assessed/performed Overall Cognitive Status: Within Functional Limits for tasks assessed    Extremity/Trunk Assessment Right Lower Extremity Assessment RLE ROM/Strength/Tone: WFL for tasks assessed RLE Sensation: WFL - Light Touch Left Lower Extremity Assessment LLE ROM/Strength/Tone: WFL for tasks assessed LLE Sensation: WFL - Light Touch Trunk Assessment Trunk Assessment: Kyphotic   Balance    End of Session PT - End of Session Activity Tolerance: Patient tolerated treatment well Patient left: in chair;with call bell/phone within reach;with nursing in room;with family/visitor present Nurse Communication: Mobility status  GP Functional Assessment Tool Used: Clinical judgement Functional Limitation: Mobility: Walking and moving around Mobility: Walking and Moving Around Current Status (G2952): At least 1 percent but less than 20 percent impaired, limited or restricted Mobility: Walking and Moving Around Goal Status [phone]): 0 percent  impaired, limited or restricted   Vista Deck 12/13/2012, 2:15 PM

## 2012-12-13 NOTE — Progress Notes (Signed)
Patient ID: Kathryn Whitaker, female   DOB: 06/29/1919, 79 y.o.   MRN: [id number]   SUBJECTIVE:  The patient has had no significant chest pain since yesterday. She is stable. Two-dimensional echo was done yesterday. Ejection fraction was 65% with no focal wall motion abnormalities. There was aortic valve sclerosis but no stenosis.  Filed Vitals:   12/12/12 1524 12/12/12 1556 12/12/12 2148 12/13/12 0653  BP: 87/61 140/70 105/49 99/54  Pulse: 76  57 61  Temp: 98.1 F (36.7 C)  98.1 F (36.7 C) 97.8 F (36.6 C)  TempSrc: Oral  Oral Oral  Resp: 19  16 20   Height:      Weight:      SpO2: 98%  97% 99%    Intake/Output Summary (Last 24 hours) at 12/13/12 0757 Last data filed at 12/12/12 2048  Gross per 24 hour  Intake    440 ml  Output   1250 ml  Net   -810 ml    LABS: Basic Metabolic Panel:  Recent Labs  40/98/11 1705 12/11/12 0540  NA 137 139  K 4.4 3.9  CL 103 105  CO2 25 30  GLUCOSE 135* 86  BUN 17 15  CREATININE 0.81 0.78  CALCIUM 9.7 9.3   Liver Function Tests: No results found for this basename: AST, ALT, ALKPHOS, BILITOT, PROT, ALBUMIN,  in the last 72 hours No results found for this basename: LIPASE, AMYLASE,  in the last 72 hours CBC:  Recent Labs  12/10/12 1705  WBC 4.3  NEUTROABS 2.2  HGB 12.4  HCT 37.2  MCV 90.1  PLT 241   Cardiac Enzymes:  Recent Labs  12/10/12 1705 12/10/12 2315 12/11/12 0540  TROPONINI <0.30 <0.30 <0.30   BNP: No components found with this basename: POCBNP,  D-Dimer: No results found for this basename: DDIMER,  in the last 72 hours Hemoglobin A1C: No results found for this basename: HGBA1C,  in the last 72 hours Fasting Lipid Panel: No results found for this basename: CHOL, HDL, LDLCALC, TRIG, CHOLHDL, LDLDIRECT,  in the last 72 hours Thyroid Function Tests: No results found for this basename: TSH, T4TOTAL, FREET3, T3FREE, THYROIDAB,  in the last 72 hours  RADIOLOGY: Dg Chest 2 View  12/10/2012   *RADIOLOGY  REPORT*  Clinical Data: Short of breath and cough  CHEST - 2 VIEW  Comparison: 11/20/2012  Findings: Heart size is normal.  Lungs are clear.  Negative for heart failure or pneumonia.  No mass or effusion is present.  IMPRESSION: No acute abnormality.   Original Report Authenticated By: Janeece Riggers, M.D.   Dg Chest 2 View  11/20/2012   *RADIOLOGY REPORT*  Clinical Data: Asthma exacerbation shortness of breath.  CHEST - 2 VIEW  Comparison: Two-view chest x-ray 03/01/2012, 04/09/2010, 07/10/2009.  Findings: Cardiac silhouette upper normal in size for the AP semi- erect technique, unchanged.  Thoracic aorta tortuous and mildly atherosclerotic, unchanged.  Hilar and mediastinal contours otherwise unremarkable.  Lungs clear.  Bronchovascular markings normal.  Pulmonary vascularity normal.  No pneumothorax.  No pleural effusions.  Degenerative changes involving the thoracic spine and mild generalized osseous demineralization.  IMPRESSION: No acute cardiopulmonary disease.   Original Report Authenticated By: Hulan Saas, M.D.    PHYSICAL EXAM  Patient is stable. She's oriented. Lungs reveal scattered rhonchi. Cardiac exam reveals a soft systolic murmur.    TELEMETRY: I have reviewed telemetry today December 13, 2012. There is normal sinus rhythm.   ASSESSMENT AND PLAN:    Chest pain  No further workup for chest pain. Two-dimensional echo shows excellent left ventricular function. Cardiology will sign off.     GERD   Dyspnea on exertion   Moderate protein-calorie malnutrition   Dyslipidemia   HTN (hypertension)   [name] 12/13/2012 7:57 AM

## 2012-12-14 MED ORDER — ALPRAZOLAM 0.25 MG PO TABS: 0.2500 mg | ORAL_TABLET | Freq: Three times a day (TID) | ORAL | Status: DC | PRN

## 2012-12-14 MED ORDER — ACETAMINOPHEN 325 MG PO TABS: 650.0000 mg | ORAL_TABLET | Freq: Four times a day (QID) | ORAL | Status: DC | PRN

## 2012-12-14 NOTE — Discharge Summary (Signed)
Physician Discharge Summary  Kathryn Whitaker BJY:[id number] DOB: 1920/03/25 DOA: 12/10/2012  PCP: Michele Mcalpine, MD  Admit date: 12/10/2012 Discharge date: 12/14/2012  Recommendations for Outpatient Follow-up:  1. F/U with PCP in 1-2 weeks.  Discharge Diagnoses:  Principal Problem:    Chest pain Active Problems:    GERD    Dyspnea on exertion    Moderate protein-calorie malnutrition    Dyslipidemia    HTN (hypertension)   Discharge Condition: Improved.  Diet recommendation: Low sodium, heart healthy.  History of present illness:  Kathryn Whitaker is an 79 y.o. female with a PMH of hypertension, hyperlipidemia, GERD who was admitted on 12/10/2012 with chest pain. Two-dimensional echocardiography confirmed an ejection fraction of 65% with no focal wall motion abnormalities. She's been evaluated by cardiology with no further cardiac workup recommended.  Hospital Course by problem:  Principal Problem:  Atypical chest pain  -Cardiac enzymes cycled every 6 hours x3 sets, negative.  -EKG with nonspecific ST elevation in V1-V2, unchanged from prior EKGs.  -Two-dimensional echocardiogram done 12/12/2012 with normal LV function and no regional wall motion abnormalities.  -No further cardiology evaluation recommended, status post inpatient evaluation by Dr. Myrtis Ser.  -Continue ASA. Active Problems: Dyspnea on exertion -Resolved.  Hypertension  -Continue losartan.  Hyperlipidemia  -Continue atorvastatin.  Anxiety  -Continue Xanax 0.25 mg 3 times a day when necessary.  GERD  -Continue PPI therapy.   Procedures:  2 D Echo 12/12/12:  Study Conclusions  - Left ventricle: Upper septal thickening. No LVOT gradient. The cavity size was normal. Wall thickness was increased in a pattern of mild LVH. The estimated ejection fraction was 65%. Wall motion was normal; there were no regional wall motion abnormalities. - Aortic valve: Sclerosis without stenosis. No  significant regurgitation. - Right ventricle: The cavity size was normal. Systolic function was mildly reduced. - Pulmonary arteries: PA peak pressure: 42mm Hg (S).  Medical Consultants:  Dr. [name], Cardiology.   Discharge Exam: Filed Vitals:   12/14/12 0438  BP: 104/51  Pulse: 71  Temp: 98.2 F (36.8 C)  Resp: 18   Filed Vitals:   12/13/12 2014 12/13/12 2055 12/14/12 0438 12/14/12 0833  BP: 103/51  104/51   Pulse: 76  71   Temp: 98.1 F (36.7 C)  98.2 F (36.8 C)   TempSrc: Oral  Oral   Resp: 18  18   Height:      Weight:      SpO2: 97% 96% 98% 99%   Gen: NAD  Cardiovascular: RRR, II/VI SEM  Respiratory: Lungs CTAB  Gastrointestinal: Abdomen soft, NT/ND, + BS  Extremities: No C/E/C  Discharge Instructions  Discharge Orders   Future Appointments Provider Department Dept Phone   12/30/2012 11:00 AM Michele Mcalpine, MD [facility] [phone]   Future Orders Complete By Expires     Call MD for:  persistant nausea and vomiting  As directed     Call MD for:  severe uncontrolled pain  As directed     Call MD for:  temperature >100.4  As directed     Diet - low sodium heart healthy  As directed     Discharge instructions  As directed     Comments:      You were cared for by Dr. Hillery Aldo  (a hospitalist) during your hospital stay. If you have any questions about your discharge medications or the care you received while you were in the hospital after you are discharged, you can  call the unit and ask to speak with the hospitalist on call if the hospitalist that took care of you is not available. Once you are discharged, your primary care physician will handle any further medical issues. Please note that NO REFILLS for any discharge medications will be authorized once you are discharged, as it is imperative that you return to your primary care physician (or establish a relationship with a primary care physician if you do not have one) for your aftercare  needs so that they can reassess your need for medications and monitor your lab values.  Any outstanding tests can be reviewed by your PCP at your follow up visit.  If you do not have a primary care physician, you can call [phone] for a physician referral.  It is highly recommended that you obtain a PCP for hospital follow up.    Increase activity slowly  As directed     Walk with assistance  As directed     Walker   As directed         Medication List    TAKE these medications       acetaminophen 325 MG tablet  Commonly known as:  TYLENOL  Take 2 tablets (650 mg total) by mouth every 6 (six) hours as needed.     albuterol 108 (90 BASE) MCG/ACT inhaler  Commonly known as:  PROVENTIL HFA  Inhale 2 puffs into the lungs every 6 (six) hours as needed. For shortness of breath     ALPRAZolam 0.25 MG tablet  Commonly known as:  XANAX  Take 1 tablet (0.25 mg total) by mouth 3 (three) times daily as needed. for anxiety     aspirin EC 81 MG tablet  Take 81 mg by mouth daily.     atorvastatin 80 MG tablet  Commonly known as:  LIPITOR  Take 80 mg by mouth daily.     budesonide-formoterol 80-4.5 MCG/ACT inhaler  Commonly known as:  SYMBICORT  Inhale 2 puffs into the lungs 2 (two) times daily as needed. For shortness of breath     CALTRATE 600+D 600-400 MG-UNIT per tablet  Generic drug:  Calcium Carbonate-Vitamin D  Take 1 tablet by mouth daily. For bone health     fish oil-omega-3 fatty acids 1000 MG capsule  Take 1 g by mouth 2 (two) times daily.     losartan 100 MG tablet  Commonly known as:  COZAAR  Take 100 mg by mouth daily.     multivitamin with minerals Tabs  Take 1 tablet by mouth daily.     omeprazole 20 MG capsule  Commonly known as:  PRILOSEC  Take 20 mg by mouth daily.     polyethylene glycol packet  Commonly known as:  MIRALAX / GLYCOLAX  Take 17 g by mouth daily as needed. For constipation     senna-docusate 8.6-50 MG per tablet  Commonly known as:   Senokot-S  Take 1 tablet by mouth daily as needed. For constipation     traMADol 50 MG tablet  Commonly known as:  ULTRAM  Take 1 tablet (50 mg total) by mouth every 6 (six) hours as needed for pain.     Vitamin D 1000 UNITS capsule  Take 1,000 Units by mouth daily.          The results of significant diagnostics from this hospitalization (including imaging, microbiology, ancillary and laboratory) are listed below for reference.    Significant Diagnostic Studies: Dg Chest 2 View  12/10/2012   *  RADIOLOGY REPORT*  Clinical Data: Short of breath and cough  CHEST - 2 VIEW  Comparison: 11/20/2012  Findings: Heart size is normal.  Lungs are clear.  Negative for heart failure or pneumonia.  No mass or effusion is present.  IMPRESSION: No acute abnormality.   Original Report Authenticated By: Janeece Riggers, M.D.   Dg Chest 2 View  11/20/2012   *RADIOLOGY REPORT*  Clinical Data: Asthma exacerbation shortness of breath.  CHEST - 2 VIEW  Comparison: Two-view chest x-ray 03/01/2012, 04/09/2010, 07/10/2009.  Findings: Cardiac silhouette upper normal in size for the AP semi- erect technique, unchanged.  Thoracic aorta tortuous and mildly atherosclerotic, unchanged.  Hilar and mediastinal contours otherwise unremarkable.  Lungs clear.  Bronchovascular markings normal.  Pulmonary vascularity normal.  No pneumothorax.  No pleural effusions.  Degenerative changes involving the thoracic spine and mild generalized osseous demineralization.  IMPRESSION: No acute cardiopulmonary disease.   Original Report Authenticated By: Hulan Saas, M.D.    Labs:  Basic Metabolic Panel:  Recent Labs Lab 12/10/12 1705 12/11/12 0540  NA 137 139  K 4.4 3.9  CL 103 105  CO2 25 30  GLUCOSE 135* 86  BUN 17 15  CREATININE 0.81 0.78  CALCIUM 9.7 9.3   GFR Estimated Creatinine Clearance: 37.9 ml/min (by C-G formula based on Cr of 0.78).  CBC:  Recent Labs Lab 12/10/12 1705  WBC 4.3  NEUTROABS 2.2  HGB 12.4   HCT 37.2  MCV 90.1  PLT 241   Cardiac Enzymes:  Recent Labs Lab 12/10/12 1705 12/10/12 2315 12/11/12 0540  TROPONINI <0.30 <0.30 <0.30    Time coordinating discharge: 35 minutes.  Signed:  RAMA,CHRISTINA  Pager [phone] [facility] 12/14/2012, 12:38 PM

## 2012-12-14 NOTE — Progress Notes (Signed)
Physical Therapy Treatment Patient Details Name: Kathryn Whitaker MRN: [id number] DOB: [birth year] Today's Date: 12/14/2012 Time: 4098-1191 PT Time Calculation (min): 26 min  PT Assessment / Plan / Recommendation Comments on Treatment Session  Pt ambulated in hallway with RW and had 1 LOB requiring min assist also with slow gait velocity indicating high fall risk.  Pt also performed balance activities.  Pt states she can either stay with son or daughter or go to [facility].  Continue to recommend [facility] and HHPT if aide can be provided 3-5 days/week for higher level activities like getting in shower, laundry, and going up/down stairs.  If [facility] aide not an option then would recommend [facility] for follow up to allow pts balance and strength to increase before D/C home.      Follow Up Recommendations  Home health PT;SNF;Supervision/Assistance - 24 hour     Does the patient have the potential to tolerate intense rehabilitation     Barriers to Discharge        Equipment Recommendations  Rolling walker with 5" wheels (?short)    Recommendations for Other Services    Frequency     Plan Discharge plan remains appropriate;Frequency remains appropriate    Precautions / Restrictions Precautions Precautions: Fall Restrictions Weight Bearing Restrictions: No   Pertinent Vitals/Pain n/a    Mobility  Transfers Transfers: Sit to Stand;Stand to Sit Sit to Stand: 4: Min guard;With upper extremity assist;From chair/3-in-1 Stand to Sit: 4: Min guard;With upper extremity assist;With armrests;To chair/3-in-1 Details for Transfer Assistance: Min/guard for safety with cues for hand placement.  Ambulation/Gait Ambulation/Gait Assistance: 4: Min assist Ambulation Distance (Feet): 240 Feet Assistive device: Rolling walker Ambulation/Gait Assistance Details: pt with LOB x1 upon exiting room requiring assist to correct otherwise min/guard for safety.  verbal cues for safe use of RW including RW distance and  turning with RW Gait Pattern: Step-through pattern;Decreased stride length;Trunk flexed Gait velocity: 0.24 m/s indicating high fall risk (40 feet in 50 sec) Stairs: No Wheelchair Mobility Wheelchair Mobility: No    Exercises     PT Diagnosis:    PT Problem List:   PT Treatment Interventions:     PT Goals Acute Rehab PT Goals PT Goal: Sit to Stand - Progress: Progressing toward goal PT Goal: Stand to Sit - Progress: Progressing toward goal PT Goal: Ambulate - Progress: Progressing toward goal PT Goal: Perform Home Exercise Program - Progress: Progressing toward goal  Visit Information  Last PT Received On: 12/14/12 Assistance Needed: +1    Subjective Data  Subjective: I gave up my drivers license last week.   Cognition  Cognition Arousal/Alertness: Awake/alert Behavior During Therapy: WFL for tasks assessed/performed Overall Cognitive Status: Within Functional Limits for tasks assessed    Balance  Balance Balance Assessed: Yes Static Standing Balance Static Standing - Level of Assistance: 4: Min assist Static Standing - Comment/# of Minutes: performed feet together with eyes closed holding 1 min, tandem each LE hold as long as possible for 4 min, SLS hold each LE x3, stepping on pillow and held balance, marching in place without UE support x20, turning 360 degrees x2 to L and R, pt with LOB with each activity requiring min assist to correct Single Leg Stance - Right Leg: 8 Single Leg Stance - Left Leg: 8 Tandem Stance - Right Leg: 1 Tandem Stance - Left Leg: 1  End of Session PT - End of Session Activity Tolerance: Patient tolerated treatment well Patient left: in chair;with call bell/phone within reach;with  chair alarm set   GP     Hulbert Branscome,KATHrine E 12/14/2012, 10:49 AM Zenovia Jarred, PT, DPT 12/14/2012 Pager: [phone]

## 2012-12-14 NOTE — Clinical Social Work Psychosocial (Unsigned)
       Clinical Social Work Department BRIEF PSYCHOSOCIAL ASSESSMENT 12/14/2012  Patient:  FAATIMAH, SPIELBERG     Account Number:  [id number]     Admit date:  12/10/2012  Clinical Social Worker:  Hattie Perch  Date/Time:  12/14/2012 12:00 M  Referred by:  Physician  Date Referred:  12/14/2012 Referred for  SNF Placement   Other Referral:   Interview type:  Patient Other interview type:    PSYCHOSOCIAL DATA Living Status:  ALONE Admitted from facility:   Level of care:   Primary support name:  Genia Del Primary support relationship to patient:  CHILD, ADULT Degree of support available:   good    CURRENT CONCERNS Current Concerns  Post-Acute Placement   Other Concerns:    SOCIAL WORK ASSESSMENT / PLAN CSW met with patient. patient is alert and oriented X3. patient wants to go to snf for a week until she can get things set up for her to have someone with her all the time. patient does not qualify for rehab under medicare but is agreeable to private pay and is requesting [facility]. C   Assessment/plan status:   Other assessment/ plan:   Information/referral to community resources:    PATIENTS/FAMILYS RESPONSE TO PLAN OF CARE: CSW got private room rates for patient at [facility] and she is agreeable to paying for a week in advance as that is all she thinks she will need. patient is anxious to get there.

## 2012-12-14 NOTE — Progress Notes (Signed)
OT Cancellation Note  Patient Details Name: Kathryn Whitaker MRN: [id number] DOB: 25-May-1920   Cancelled Treatment:    Reason Eval/Treat Not Completed: Other (comment) (Noted DC to [facility] today. Defer OT eval to SNF)  Dangela How, [place] Kung 12/14/2012, 1:50 PM

## 2012-12-14 NOTE — Progress Notes (Signed)
Patient cleared for discharge. Packet copied and placed in [place]. Patients son to transport.  Amiaya Mcneeley C. Parminder Cupples MSW, LCSW [phone]

## 2012-12-15 ENCOUNTER — Telehealth: Payer: Self-pay | Admitting: Pulmonary Disease

## 2012-12-15 NOTE — Telephone Encounter (Signed)
SN is aware and will sign off of this message

## 2012-12-15 NOTE — Telephone Encounter (Signed)
Will forward to SN as an FYI 

## 2012-12-17 ENCOUNTER — Telehealth: Payer: Self-pay | Admitting: Pulmonary Disease

## 2012-12-17 NOTE — Telephone Encounter (Signed)
Called and spoke with pts son and he stated that the pt is at [facility] now and she thought that they were going to do some PT with her while she was there.  She had to pay out of pocket for this stay and will be leaving on Monday.  I advised her son that SN did not see or do the d/c from the hospital and that i could speak with him about sending an order over to have the pt set up with therapy but she will likely have to pay out of pocket for this as well.  He did not want to do this at this time.  He did state that the pt will be coming to [place] to stay with him for a while once d/c from [facility].  He wants her to stay with him but he is aware that the pt will want to come back home.  i advised him that we could try and get her set up with a home care company to come out and assess her to see if she will qualify for PT/OT at home and for any other services.  He stated that he will call back if any of this is needed or if he has any further questions on this. He is aware of her appt with SN on 7-9 and they will keep this appt.

## 2012-12-28 ENCOUNTER — Telehealth: Payer: Self-pay | Admitting: Pulmonary Disease

## 2012-12-28 MED ORDER — LOSARTAN POTASSIUM 100 MG PO TABS: 100.0000 mg | ORAL_TABLET | Freq: Every day | ORAL | Status: DC

## 2012-12-28 NOTE — Telephone Encounter (Signed)
Spoke with patients son Patient has 1 tablet left of losartan Requesting refill Rx has been sent to verified pharmacy and nothing further needed at this time

## 2012-12-30 ENCOUNTER — Encounter: Payer: Self-pay | Admitting: Pulmonary Disease

## 2012-12-30 ENCOUNTER — Ambulatory Visit (INDEPENDENT_AMBULATORY_CARE_PROVIDER_SITE_OTHER): Payer: Medicare Other | Admitting: Pulmonary Disease

## 2012-12-30 VITALS — BP 116/70 | HR 65 | Temp 97.0°F | Ht 63.0 in | Wt 142.8 lb

## 2012-12-30 DIAGNOSIS — J4489 Other specified chronic obstructive pulmonary disease: Secondary | ICD-10-CM | POA: Insufficient documentation

## 2012-12-30 DIAGNOSIS — I10 Essential (primary) hypertension: Secondary | ICD-10-CM

## 2012-12-30 DIAGNOSIS — K589 Irritable bowel syndrome without diarrhea: Secondary | ICD-10-CM

## 2012-12-30 DIAGNOSIS — I6350 Cerebral infarction due to unspecified occlusion or stenosis of unspecified cerebral artery: Secondary | ICD-10-CM

## 2012-12-30 DIAGNOSIS — K219 Gastro-esophageal reflux disease without esophagitis: Secondary | ICD-10-CM

## 2012-12-30 DIAGNOSIS — E785 Hyperlipidemia, unspecified: Secondary | ICD-10-CM

## 2012-12-30 DIAGNOSIS — J449 Chronic obstructive pulmonary disease, unspecified: Secondary | ICD-10-CM | POA: Insufficient documentation

## 2012-12-30 DIAGNOSIS — K5730 Diverticulosis of large intestine without perforation or abscess without bleeding: Secondary | ICD-10-CM | POA: Insufficient documentation

## 2012-12-30 DIAGNOSIS — F411 Generalized anxiety disorder: Secondary | ICD-10-CM | POA: Insufficient documentation

## 2012-12-30 DIAGNOSIS — M545 Low back pain, unspecified: Secondary | ICD-10-CM | POA: Insufficient documentation

## 2012-12-30 DIAGNOSIS — M1990 Unspecified osteoarthritis, unspecified site: Secondary | ICD-10-CM

## 2012-12-30 MED ORDER — CEFADROXIL 500 MG PO CAPS: 500.0000 mg | ORAL_CAPSULE | Freq: Two times a day (BID) | ORAL | Status: DC

## 2012-12-30 NOTE — Patient Instructions (Addendum)
Today we updated your med list in our EPIC system...    Continue your current medications the same...  For the iflammed cyst in your groin>>    Start HOT SOAKS w/ a warm washcloth several times daily...    Take the antibiotic DURICEF 500mg - one tab twice daily til gone...    Take an OTC probiotic like ALIGN one daily as well...  If this does not resolve- then you will need to see a GYN or surgeon in [place] to have it drained...  We will fax the form you provided to your dentist in [facility]...  Call for any questions...  Let's plan a routine follow up visit in 3-33mo, sooner if needed for problems.Marland KitchenMarland Kitchen
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
## 2013-01-04 ENCOUNTER — Telehealth: Payer: Self-pay | Admitting: Pulmonary Disease

## 2013-01-04 MED ORDER — ALPRAZOLAM 0.25 MG PO TABS: 0.2500 mg | ORAL_TABLET | Freq: Three times a day (TID) | ORAL | Status: DC | PRN

## 2013-01-04 MED ORDER — OMEPRAZOLE 20 MG PO CPDR: 20.0000 mg | DELAYED_RELEASE_CAPSULE | Freq: Every day | ORAL | Status: DC

## 2013-01-04 MED ORDER — ATORVASTATIN CALCIUM 40 MG PO TABS: 40.0000 mg | ORAL_TABLET | Freq: Every day | ORAL | Status: DC

## 2013-01-04 MED ORDER — TRAMADOL HCL 50 MG PO TABS: 50.0000 mg | ORAL_TABLET | Freq: Four times a day (QID) | ORAL | Status: DC | PRN

## 2013-01-04 NOTE — Telephone Encounter (Signed)
Per SN- Ok to send in refills and lower liptor to 40mg   Spoke with patients son made him aware RXs have been sent to verified pharmacy and nothing further needed at this time

## 2013-01-04 NOTE — Telephone Encounter (Signed)
I spoke with Sherilyn Cooter. He stated pt needs refill on; Tramadol-last refilled 05/18/12 #100 x 6 refills omperazole 20 mg he was buying OTC but pt is requesting an RX be called in instead alprazolam last refilled by Dr. Darnelle Catalan 12/14/12 #90 x 0 refills (pt has 6-8 tabs left) lipitor 80 mg but per Sherilyn Cooter pt only takes half of this. Please advis eif okay to send in RX for lipitor 40 mg?  thanks SN   Last OV 12/30/12 Pending 04/05/13

## 2013-01-05 ENCOUNTER — Telehealth: Payer: Self-pay | Admitting: Pulmonary Disease

## 2013-01-05 NOTE — Telephone Encounter (Signed)
Allergies have been faxed to the number given at Dr. Jodelle Gross office.  i have called and spoke with pts son and he is aware. pts son requested that a copy of her allergies be mailed to him so he can have this information for her medical records that he is keeping for her.  Address in [place] is   [address] [place], [place]  [address]  This information has been placed in the mail and nothing further is needed

## 2013-02-23 ENCOUNTER — Telehealth: Payer: Self-pay | Admitting: Pulmonary Disease

## 2013-02-23 NOTE — Telephone Encounter (Signed)
I spoke with pt son. He stated pt omeprazole will need PA. He stated we need to call [phone] for this.  I called and spoke with Apolinar Junes and initiated PA. This was approved from 02/02/13-02/23/13. Case #: [id number]  I called and made son aware. Nothing further needed

## 2013-03-02 ENCOUNTER — Other Ambulatory Visit: Payer: Self-pay | Admitting: Pulmonary Disease

## 2013-03-24 ENCOUNTER — Telehealth: Payer: Self-pay | Admitting: Pulmonary Disease

## 2013-03-24 DIAGNOSIS — E440 Moderate protein-calorie malnutrition: Secondary | ICD-10-CM

## 2013-03-24 NOTE — Telephone Encounter (Signed)
I spoke with Kathryn Whitaker. He stated pt has to drink ensure daily and was told by a nurse with [facility] that medicare will pay for this medication through them. He is requesting we send RX to [facility]. He stated this is costing him a lot out of pocket bc at times she drinks 2 daily.  I called [facility] to see if they do supply ensure for pt's. LMTCB x1

## 2013-03-25 NOTE — Telephone Encounter (Signed)
Order has been placed for [facility] for the pts ensure.  msg has been sent to [facility].  Nothing further is needed.

## 2013-03-25 NOTE — Telephone Encounter (Signed)
I spoke with Kathryn Whitaker. She stated they do supply ensure for pt and medicare will pay for this. We can place the order in epic and then send message to her and let her know. Dr. Kriste Basque please advise if you are okay with this since notes do not say anything about pt being on ensure thanks Last OV 12/30/12 Pending 04/05/13

## 2013-03-25 NOTE — Telephone Encounter (Signed)
lmomtcb x2 for [employer]

## 2013-03-25 NOTE — Telephone Encounter (Signed)
[name] calling again in ref to previous msg.Kathryn Whitaker ° °

## 2013-04-05 ENCOUNTER — Telehealth: Payer: Self-pay | Admitting: Pulmonary Disease

## 2013-04-05 ENCOUNTER — Ambulatory Visit (INDEPENDENT_AMBULATORY_CARE_PROVIDER_SITE_OTHER): Payer: Medicare Other | Admitting: Pulmonary Disease

## 2013-04-05 ENCOUNTER — Encounter: Payer: Self-pay | Admitting: Pulmonary Disease

## 2013-04-05 VITALS — BP 124/76 | HR 71 | Temp 97.7°F | Ht 63.0 in | Wt 140.6 lb

## 2013-04-05 DIAGNOSIS — I6350 Cerebral infarction due to unspecified occlusion or stenosis of unspecified cerebral artery: Secondary | ICD-10-CM

## 2013-04-05 DIAGNOSIS — F411 Generalized anxiety disorder: Secondary | ICD-10-CM | POA: Insufficient documentation

## 2013-04-05 DIAGNOSIS — M1990 Unspecified osteoarthritis, unspecified site: Secondary | ICD-10-CM

## 2013-04-05 DIAGNOSIS — K219 Gastro-esophageal reflux disease without esophagitis: Secondary | ICD-10-CM

## 2013-04-05 DIAGNOSIS — K5730 Diverticulosis of large intestine without perforation or abscess without bleeding: Secondary | ICD-10-CM

## 2013-04-05 DIAGNOSIS — M545 Low back pain: Secondary | ICD-10-CM

## 2013-04-05 DIAGNOSIS — E785 Hyperlipidemia, unspecified: Secondary | ICD-10-CM

## 2013-04-05 DIAGNOSIS — J441 Chronic obstructive pulmonary disease with (acute) exacerbation: Secondary | ICD-10-CM

## 2013-04-05 DIAGNOSIS — R21 Rash and other nonspecific skin eruption: Secondary | ICD-10-CM

## 2013-04-05 DIAGNOSIS — J449 Chronic obstructive pulmonary disease, unspecified: Secondary | ICD-10-CM

## 2013-04-05 DIAGNOSIS — I10 Essential (primary) hypertension: Secondary | ICD-10-CM

## 2013-04-05 MED ORDER — AZITHROMYCIN 250 MG PO TABS: ORAL_TABLET | ORAL | Status: DC
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
## 2013-04-05 NOTE — Patient Instructions (Signed)
Today we updated your med list in our EPIC system...    Continue your current medications the same...  We wrote a new prescription for a ZPak to take for your bronchial infection- it is refillable if you need it again this winter...  Take the OTC MUCINEX 600mg - 1-2 tabs twice daily w/ fluids...    You may also use the OTC cough syrup- DELSYM take 1-2 tsp twice daily as needed for cough...  Call for any questions...  Let's plan a follow up visit in 6 months w/ FASTING blood work at that time.Marland KitchenMarland Kitchen

## 2013-04-05 NOTE — Telephone Encounter (Signed)
Refill sent to [facility]. Pt son advised. Carron Curie, CMA

## 2013-04-29 ENCOUNTER — Ambulatory Visit: Payer: Self-pay | Admitting: Internal Medicine

## 2013-06-01 ENCOUNTER — Other Ambulatory Visit: Payer: Self-pay | Admitting: Pulmonary Disease

## 2013-06-02 ENCOUNTER — Telehealth: Payer: Self-pay | Admitting: Pulmonary Disease

## 2013-06-02 NOTE — Telephone Encounter (Signed)
Spoke with patient-states she wants to be seen as she has been coughing x 3-4 days-productive green in color; chills but unsure of any fever as she has not be able to check. Pt was placed on TP's schedule for Thursday 06-03-13 at 9:45am. Nothing more needed at this time;will sign off on message.

## 2013-06-03 ENCOUNTER — Other Ambulatory Visit (INDEPENDENT_AMBULATORY_CARE_PROVIDER_SITE_OTHER): Payer: Medicare Other

## 2013-06-03 ENCOUNTER — Encounter: Payer: Self-pay | Admitting: Adult Health

## 2013-06-03 ENCOUNTER — Ambulatory Visit (INDEPENDENT_AMBULATORY_CARE_PROVIDER_SITE_OTHER): Payer: Medicare Other | Admitting: Adult Health

## 2013-06-03 VITALS — BP 118/70 | HR 81 | Temp 97.6°F | Ht 63.0 in | Wt 139.7 lb

## 2013-06-03 DIAGNOSIS — J449 Chronic obstructive pulmonary disease, unspecified: Secondary | ICD-10-CM

## 2013-06-03 DIAGNOSIS — R300 Dysuria: Secondary | ICD-10-CM

## 2013-06-03 DIAGNOSIS — J440 Chronic obstructive pulmonary disease with acute lower respiratory infection: Secondary | ICD-10-CM

## 2013-06-03 DIAGNOSIS — J4489 Other specified chronic obstructive pulmonary disease: Secondary | ICD-10-CM

## 2013-06-03 LAB — URINALYSIS, ROUTINE W REFLEX MICROSCOPIC
Leukocytes, UA: NEGATIVE
Nitrite: NEGATIVE
Specific Gravity, Urine: 1.015 (ref 1.000–1.030)
Total Protein, Urine: NEGATIVE
pH: 6.5 (ref 5.0–8.0)

## 2013-06-03 MED ORDER — CEFDINIR 300 MG PO CAPS: 300.0000 mg | ORAL_CAPSULE | Freq: Two times a day (BID) | ORAL | Status: DC

## 2013-06-03 NOTE — Patient Instructions (Signed)
Omnicef 300mg  Twice daily  For 7 days  Mucinex DM Twice daily  As needed  Cough/congestion  Please contact office for sooner follow up if symptoms do not improve or worsen or seek emergency care  Follow up Dr. Kriste Basque  As planned and As needed

## 2013-06-03 NOTE — Assessment & Plan Note (Signed)
Flare   Plan  Omnicef 300mg  Twice daily  For 7 days  Mucinex DM Twice daily  As needed  Cough/congestion  Please contact office for sooner follow up if symptoms do not improve or worsen or seek emergency care  Follow up Dr. Kriste Basque  As planned and As needed
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
## 2013-06-11 NOTE — Progress Notes (Signed)
Quick Note:  LMOM TCB x2. ______ 

## 2013-06-14 ENCOUNTER — Telehealth: Payer: Self-pay | Admitting: Pulmonary Disease

## 2013-06-14 NOTE — Telephone Encounter (Signed)
I spoke with patient about results and she verbalized understanding and had no questions 

## 2013-09-01 ENCOUNTER — Other Ambulatory Visit: Payer: Self-pay | Admitting: Pulmonary Disease

## 2013-09-01 DIAGNOSIS — E785 Hyperlipidemia, unspecified: Secondary | ICD-10-CM

## 2013-09-01 DIAGNOSIS — R079 Chest pain, unspecified: Secondary | ICD-10-CM

## 2013-09-01 DIAGNOSIS — I10 Essential (primary) hypertension: Secondary | ICD-10-CM

## 2013-09-23 ENCOUNTER — Other Ambulatory Visit: Payer: Self-pay | Admitting: Pulmonary Disease

## 2013-10-01 ENCOUNTER — Telehealth: Payer: Self-pay | Admitting: Pulmonary Disease

## 2013-10-01 NOTE — Telephone Encounter (Signed)
Spoke with front desk receptionist at Dr Dorian Heckle office and given pt's drug allergies.

## 2013-10-05 ENCOUNTER — Ambulatory Visit (INDEPENDENT_AMBULATORY_CARE_PROVIDER_SITE_OTHER): Payer: Medicare Other | Admitting: Pulmonary Disease

## 2013-10-05 ENCOUNTER — Telehealth: Payer: Self-pay | Admitting: Pulmonary Disease

## 2013-10-05 ENCOUNTER — Ambulatory Visit (INDEPENDENT_AMBULATORY_CARE_PROVIDER_SITE_OTHER)
Admission: RE | Admit: 2013-10-05 | Discharge: 2013-10-05 | Disposition: A | Discharge status: Home / Self Care | Payer: Medicare Other | Source: Ambulatory Visit | Attending: Pulmonary Disease | Admitting: Pulmonary Disease

## 2013-10-05 ENCOUNTER — Encounter: Payer: Self-pay | Admitting: Pulmonary Disease

## 2013-10-05 ENCOUNTER — Other Ambulatory Visit (INDEPENDENT_AMBULATORY_CARE_PROVIDER_SITE_OTHER): Payer: Medicare Other

## 2013-10-05 VITALS — BP 128/72 | HR 58 | Temp 97.7°F | Ht 63.0 in | Wt 136.4 lb

## 2013-10-05 DIAGNOSIS — M545 Low back pain, unspecified: Secondary | ICD-10-CM

## 2013-10-05 DIAGNOSIS — F411 Generalized anxiety disorder: Secondary | ICD-10-CM

## 2013-10-05 DIAGNOSIS — E785 Hyperlipidemia, unspecified: Secondary | ICD-10-CM

## 2013-10-05 DIAGNOSIS — I10 Essential (primary) hypertension: Secondary | ICD-10-CM

## 2013-10-05 DIAGNOSIS — E559 Vitamin D deficiency, unspecified: Secondary | ICD-10-CM

## 2013-10-05 DIAGNOSIS — K5730 Diverticulosis of large intestine without perforation or abscess without bleeding: Secondary | ICD-10-CM

## 2013-10-05 DIAGNOSIS — I6350 Cerebral infarction due to unspecified occlusion or stenosis of unspecified cerebral artery: Secondary | ICD-10-CM

## 2013-10-05 DIAGNOSIS — J4489 Other specified chronic obstructive pulmonary disease: Secondary | ICD-10-CM

## 2013-10-05 DIAGNOSIS — J449 Chronic obstructive pulmonary disease, unspecified: Secondary | ICD-10-CM

## 2013-10-05 DIAGNOSIS — K219 Gastro-esophageal reflux disease without esophagitis: Secondary | ICD-10-CM

## 2013-10-05 DIAGNOSIS — M1990 Unspecified osteoarthritis, unspecified site: Secondary | ICD-10-CM

## 2013-10-05 LAB — CBC WITH DIFFERENTIAL/PLATELET
BASOS PCT: 1.2 % (ref 0.0–3.0)
Basophils Absolute: 0 10*3/uL (ref 0.0–0.1)
EOS PCT: 2.6 % (ref 0.0–5.0)
Eosinophils Absolute: 0.1 10*3/uL (ref 0.0–0.7)
HCT: 38.1 % (ref 36.0–46.0)
Hemoglobin: 12.7 g/dL (ref 12.0–15.0)
Lymphocytes Relative: 34.8 % (ref 12.0–46.0)
Lymphs Abs: 1.4 10*3/uL (ref 0.7–4.0)
MCHC: 33.3 g/dL (ref 30.0–36.0)
MCV: 93.1 fl (ref 78.0–100.0)
MONO ABS: 0.3 10*3/uL (ref 0.1–1.0)
Monocytes Relative: 8.6 % (ref 3.0–12.0)
NEUTROS PCT: 52.8 % (ref 43.0–77.0)
Neutro Abs: 2.1 10*3/uL (ref 1.4–7.7)
Platelets: 222 10*3/uL (ref 150.0–400.0)
RBC: 4.09 Mil/uL (ref 3.87–5.11)
RDW: 13.3 % (ref 11.5–14.6)
WBC: 3.9 10*3/uL — AB (ref 4.5–10.5)

## 2013-10-05 LAB — LIPID PANEL
CHOLESTEROL: 159 mg/dL (ref 0–200)
HDL: 52.3 mg/dL (ref >39.00)
LDL CALC: 100 mg/dL — AB (ref 0–99)
TRIGLYCERIDES: 35 mg/dL (ref 0.0–149.0)
Total CHOL/HDL Ratio: 3
VLDL: 7 mg/dL (ref 0.0–40.0)

## 2013-10-05 LAB — BASIC METABOLIC PANEL
BUN: 21 mg/dL (ref 6–23)
CALCIUM: 9.5 mg/dL (ref 8.4–10.5)
CHLORIDE: 108 meq/L (ref 96–112)
CO2: 30 mEq/L (ref 19–32)
CREATININE: 0.6 mg/dL (ref 0.4–1.2)
GFR: 117.96 mL/min (ref >60.00)
Glucose, Bld: 79 mg/dL (ref 70–99)
Potassium: 4.5 mEq/L (ref 3.5–5.1)
Sodium: 143 mEq/L (ref 135–145)

## 2013-10-05 LAB — HEPATIC FUNCTION PANEL
ALT: 14 U/L (ref 0–35)
AST: 17 U/L (ref 0–37)
Albumin: 3.6 g/dL (ref 3.5–5.2)
Alkaline Phosphatase: 70 U/L (ref 39–117)
BILIRUBIN TOTAL: 0.9 mg/dL (ref 0.3–1.2)
Bilirubin, Direct: 0.1 mg/dL (ref 0.0–0.3)
Total Protein: 6.9 g/dL (ref 6.0–8.3)

## 2013-10-05 LAB — TSH: TSH: 1.76 u[IU]/mL (ref 0.35–5.50)

## 2013-10-05 MED ORDER — ALBUTEROL SULFATE HFA 108 (90 BASE) MCG/ACT IN AERS: 2.0000 | INHALATION_SPRAY | Freq: Four times a day (QID) | RESPIRATORY_TRACT | Status: DC | PRN

## 2013-10-05 MED ORDER — BUDESONIDE-FORMOTEROL FUMARATE 80-4.5 MCG/ACT IN AERO: 2.0000 | INHALATION_SPRAY | Freq: Two times a day (BID) | RESPIRATORY_TRACT | Status: DC | PRN

## 2013-10-05 NOTE — Patient Instructions (Signed)
Today we updated your med list in our EPIC system...    Continue your current medications the same...  For your breathing>>    Use the SYMBICORT- 2 sprays twice daily every day (and be sure to rinse mouth out after each treatment)...    Use the PROAIR rescue inhaler- 1 to 2 puffs up to 4 times daily AS NEEDED for wheezing...    Use the OTC MUCINEX 600mg  tabs- 2 tabs twice daily w/ lots of water & fluids for the thick phlegm  Stay as active as possible, exercise is good, but BE SAFE...   Today we did your follow up CXR & FASTING blood work...    We will contact you w/ the results when available...   Call for any questions or if we can be of service in any way.Marland KitchenMarland Kitchen
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
## 2013-10-05 NOTE — Telephone Encounter (Addendum)
I spoke with the pt son and he states the pt was supposed to mention to Dr. Lenna Gilford that she was scheduled to have oral surgery tomorrow to remove several teeth. I do not see this mentioned in the OV note. Her son states that she is having this With Dr. Keane Police. She is only having local numbing medication. They are needing clearance faxed to them at [phone]. Their phone # is [phone]. The pt son states they have cancelled that appt for tomorrow because Ms. Silberman stated she was to tired to go. He states they will not reschedule this until they have the clearance form Dr. Lenna Gilford. Please advise. [place] Bing, CMA   Pt son also asked about referring the pt to another MD. I discussed the options with him and he wants to stay with this location and wants to set appt with new doc coming in Sept. I provided him with the contact # for the [place] office and advised to cal in June to set appt.  Bing, CMA

## 2013-10-06 LAB — VITAMIN D 25 HYDROXY (VIT D DEFICIENCY, FRACTURES): VIT D 25 HYDROXY: 42 ng/mL (ref 30–89)

## 2013-10-06 NOTE — Telephone Encounter (Signed)
OV note has been completed by SN and faxed to Dr. Keane Police for the clearance to have teeth extracted.  Nothing further is needed.

## 2013-11-03 ENCOUNTER — Ambulatory Visit: Payer: Medicare Other | Admitting: Physician Assistant

## 2013-11-11 ENCOUNTER — Ambulatory Visit (INDEPENDENT_AMBULATORY_CARE_PROVIDER_SITE_OTHER): Payer: Medicare Other | Admitting: Internal Medicine

## 2013-11-11 ENCOUNTER — Encounter: Payer: Self-pay | Admitting: Internal Medicine

## 2013-11-11 VITALS — BP 144/88 | HR 53 | Temp 97.7°F | Wt 131.0 lb

## 2013-11-11 DIAGNOSIS — J440 Chronic obstructive pulmonary disease with acute lower respiratory infection: Secondary | ICD-10-CM

## 2013-11-11 MED ORDER — AMOXICILLIN 500 MG PO CAPS: 500.0000 mg | ORAL_CAPSULE | Freq: Three times a day (TID) | ORAL | Status: DC

## 2013-11-11 NOTE — Patient Instructions (Signed)
Plain Mucinex (NOT D) for thick secretions ;force NON dairy fluids .   Flonase OR Nasacort AQ 1 spray in each nostril twice a day as needed. Use the "crossover" technique into opposite nostril spraying toward opposite ear @ 45 degree angle, not straight up into nostril.  Plain Allegra (NOT D )  160 daily , Loratidine 10 mg , OR Zyrtec 10 mg @ bedtime  as needed for itchy eyes & sneezing. 

## 2013-11-11 NOTE — Progress Notes (Signed)
   Subjective:    Patient ID: Kathryn Whitaker, female    DOB: 04-28-1920, 80 y.o.   MRN: [id number]  HPI   Symptoms began 1 week ago with cough productive of green sputum. The cough is also associated with discomfort in the left shoulder and mid back.  Additionally she's had sore throat with coughing.  All nasal secretions are white.   There was no definite exposure to any ill individuals. Symptoms are in the context of a history of chronic obstructive lung disease with acute exacerbations  She describes prior triggers as dust, chemicals, fumes, smoke.   She has been using her Symbicort.  She describes itchy, watery, burning eyes  She she's had some chills and sweats.  She has had associated sneezing. Some mild reflux recently.     The cough is not been associated with wheezing. Her shortness of breath is @ her baseline      Review of Systems No associated fever.  She also denies pleuritic chest pain or hemoptysis  She has no frontal or facial sinus pain. She states she has a diffuse rash over her body. This was clarified to be diffuse drying.    Objective:   Physical Exam   She is adequately nourished but very frail.  The maxilla is edentulous. She has a few remaining mandibular teeth with marked gum recession  Pattern alopecia is noted over the crown  There is asymmetry of the anterior chest most notable at the clavicular heads. There is asymmetry of the posterior  thoracic musculature suggesting occult scoliosis  Faint heart sounds with grade 8/9-3 systolic murmur  Breath sounds are decreased without rhonchi or rales.  There is poor diaphragmatic excursion and decreased inflation of the chest.  No rash visible   No  lymphadenopathy about the head, neck, or axilla noted.   Eyes: No conjunctival inflammation or lid edema is present. There is no scleral icterus.  Ears:  External ear exam shows no significant lesions or deformities.  Otoscopic examination  reveals clear canals, tympanic membranes are intact bilaterally without bulging, retraction, inflammation or discharge.  Nose:  External nasal examination shows no deformity or inflammation. Nasal mucosa aredry without lesions or exudates. No septal dislocation or deviation.No obstruction to airflow.   Oral exam: lips and gums are healthy appearing.There is no oropharyngeal erythema or exudate noted.   Neck:  No deformities, thyromegaly, masses, or tenderness noted.      Heart:  Normal rate and regular rhythm. S1 and S2 normal without gallop, murmur, click, rub    Lungs:No increased work of breathing.    Extremities:  No cyanosis, edema, or clubbing  noted    Skin: Warm & dry          Assessment & Plan:  #1 acute bronchitic exacerbation in context of COPD See orders

## 2013-11-11 NOTE — Progress Notes (Signed)
Pre visit review using our clinic review tool, if applicable. No additional management support is needed unless otherwise documented below in the visit note. 

## 2013-11-24 ENCOUNTER — Emergency Department (HOSPITAL_COMMUNITY)
Admission: EM | Admit: 2013-11-24 | Discharge: 2013-11-24 | Disposition: A | Discharge status: Home / Self Care | Payer: Medicare Other | Attending: Emergency Medicine | Admitting: Emergency Medicine

## 2013-11-24 ENCOUNTER — Emergency Department (HOSPITAL_COMMUNITY): Payer: Medicare Other

## 2013-11-24 ENCOUNTER — Encounter (HOSPITAL_COMMUNITY): Payer: Self-pay | Admitting: Emergency Medicine

## 2013-11-24 DIAGNOSIS — Z860100 Personal history of colon polyps, unspecified: Secondary | ICD-10-CM | POA: Insufficient documentation

## 2013-11-24 DIAGNOSIS — R109 Unspecified abdominal pain: Secondary | ICD-10-CM | POA: Insufficient documentation

## 2013-11-24 DIAGNOSIS — M545 Low back pain, unspecified: Secondary | ICD-10-CM | POA: Insufficient documentation

## 2013-11-24 DIAGNOSIS — J45909 Unspecified asthma, uncomplicated: Secondary | ICD-10-CM | POA: Insufficient documentation

## 2013-11-24 DIAGNOSIS — F411 Generalized anxiety disorder: Secondary | ICD-10-CM | POA: Insufficient documentation

## 2013-11-24 DIAGNOSIS — E559 Vitamin D deficiency, unspecified: Secondary | ICD-10-CM | POA: Insufficient documentation

## 2013-11-24 DIAGNOSIS — Z7982 Long term (current) use of aspirin: Secondary | ICD-10-CM | POA: Insufficient documentation

## 2013-11-24 DIAGNOSIS — Z87440 Personal history of urinary (tract) infections: Secondary | ICD-10-CM | POA: Insufficient documentation

## 2013-11-24 DIAGNOSIS — M1990 Unspecified osteoarthritis, unspecified site: Secondary | ICD-10-CM | POA: Insufficient documentation

## 2013-11-24 DIAGNOSIS — I10 Essential (primary) hypertension: Secondary | ICD-10-CM | POA: Insufficient documentation

## 2013-11-24 DIAGNOSIS — Z79899 Other long term (current) drug therapy: Secondary | ICD-10-CM | POA: Insufficient documentation

## 2013-11-24 DIAGNOSIS — M549 Dorsalgia, unspecified: Secondary | ICD-10-CM | POA: Insufficient documentation

## 2013-11-24 DIAGNOSIS — K5900 Constipation, unspecified: Secondary | ICD-10-CM

## 2013-11-24 DIAGNOSIS — Z90710 Acquired absence of both cervix and uterus: Secondary | ICD-10-CM | POA: Insufficient documentation

## 2013-11-24 DIAGNOSIS — Z792 Long term (current) use of antibiotics: Secondary | ICD-10-CM | POA: Insufficient documentation

## 2013-11-24 DIAGNOSIS — E7800 Pure hypercholesterolemia, unspecified: Secondary | ICD-10-CM | POA: Insufficient documentation

## 2013-11-24 LAB — URINALYSIS, ROUTINE W REFLEX MICROSCOPIC
BILIRUBIN URINE: NEGATIVE
Glucose, UA: NEGATIVE mg/dL
Ketones, ur: NEGATIVE mg/dL
LEUKOCYTES UA: NEGATIVE
NITRITE: NEGATIVE
PH: 6.5 (ref 5.0–8.0)
Protein, ur: NEGATIVE mg/dL
SPECIFIC GRAVITY, URINE: 1.007 (ref 1.005–1.030)
Urobilinogen, UA: 1 mg/dL (ref 0.0–1.0)

## 2013-11-24 LAB — CBC
HEMATOCRIT: 37.7 % (ref 36.0–46.0)
HEMOGLOBIN: 12.1 g/dL (ref 12.0–15.0)
MCH: 30 pg (ref 26.0–34.0)
MCHC: 32.1 g/dL (ref 30.0–36.0)
MCV: 93.3 fL (ref 78.0–100.0)
Platelets: 209 10*3/uL (ref 150–400)
RBC: 4.04 MIL/uL (ref 3.87–5.11)
RDW: 12.7 % (ref 11.5–15.5)
WBC: 5.7 10*3/uL (ref 4.0–10.5)

## 2013-11-24 LAB — BASIC METABOLIC PANEL
BUN: 13 mg/dL (ref 6–23)
CHLORIDE: 104 meq/L (ref 96–112)
CO2: 28 mEq/L (ref 19–32)
Calcium: 9.9 mg/dL (ref 8.4–10.5)
Creatinine, Ser: 0.68 mg/dL (ref 0.50–1.10)
GFR calc non Af Amer: 74 mL/min — ABNORMAL LOW (ref >90)
GFR, EST AFRICAN AMERICAN: 85 mL/min — AB (ref >90)
GLUCOSE: 90 mg/dL (ref 70–99)
POTASSIUM: 4.6 meq/L (ref 3.7–5.3)
Sodium: 141 mEq/L (ref 137–147)

## 2013-11-24 LAB — URINE MICROSCOPIC-ADD ON

## 2013-11-24 MED ORDER — DOCUSATE SODIUM 100 MG PO CAPS: 100.0000 mg | ORAL_CAPSULE | Freq: Two times a day (BID) | ORAL | Status: DC

## 2013-11-24 MED ORDER — ACETAMINOPHEN 325 MG PO TABS
650.0000 mg | ORAL_TABLET | Freq: Once | ORAL | Status: AC
  Administered 2013-11-24: 650 mg via ORAL
  Filled 2013-11-24: qty 2

## 2013-11-24 MED ORDER — POLYETHYLENE GLYCOL 3350 17 G PO PACK: 17.0000 g | PACK | Freq: Every day | ORAL | Status: DC

## 2013-11-24 NOTE — ED Provider Notes (Signed)
CSN: [id number]     Arrival date & time 11/24/13  0946 History   First MD Initiated Contact with Patient 11/24/13 [phone]     Chief Complaint  Patient presents with  . Constipation     (Consider location/radiation/quality/duration/timing/severity/associated sxs/prior Treatment) HPI 80 year old female presents with low back pain for last 3-4 days. She states started after church. There is no trauma. She describes as low back and in her buttock cheeks. Does not radiate down to her legs. No weakness or numbness in her legs. It is worse with ambulation. She has been having lower abdominal pain but she's been having this intermittently for years. She's also complaining of constipation, she had one small bowel movement 3 days ago but otherwise last normal bowel movement about a week ago. She states she has had a fecal impaction in the past. Currently her low back as a throbbing type pain. Is on her right left side but denies any midline pain. No urinary symptoms. No nausea or vomiting  Past Medical History  Diagnosis Date  . Allergic rhinitis   . Acute asthmatic bronchitis   . Hypertension   . Hypercholesteremia   . GERD (gastroesophageal reflux disease)   . Diverticulosis of colon   . IBS (irritable bowel syndrome)   . History of colonic polyps   . UTI (lower urinary tract infection)   . DJD (degenerative joint disease)   . Lumbar back pain   . Lacunar infarction   . Vitamin D deficiency   . Anxiety    Past Surgical History  Procedure Laterality Date  . Abdominal hysterectomy     No family history on file. History  Substance Use Topics  . Smoking status: Never Smoker   . Smokeless tobacco: Never Used  . Alcohol Use: No   OB History   Grav Para Term Preterm Abortions TAB SAB Ect Mult Living                 Review of Systems  Constitutional: Negative for fever.  Gastrointestinal: Positive for abdominal pain and constipation. Negative for nausea, vomiting and diarrhea.    Genitourinary: Negative for dysuria.  Musculoskeletal: Positive for back pain.  Neurological: Negative for weakness and numbness.  All other systems reviewed and are negative.     Allergies  Demerol; Influenza vaccines; and Telithromycin  Home Medications   Prior to Admission medications   Medication Sig Start Date End Date Taking? Authorizing Provider  acetaminophen (TYLENOL) 325 MG tablet Take 2 tablets (650 mg total) by mouth every 6 (six) hours as needed. 12/14/12   Venetia Maxon Rama, MD  albuterol (PROVENTIL HFA) 108 (90 BASE) MCG/ACT inhaler Inhale 2 puffs into the lungs every 6 (six) hours as needed. For shortness of breath 10/05/13   Noralee Space, MD  ALPRAZolam Duanne Moron) 0.25 MG tablet Take 1/2 to 1 tablet by mouth three times daily as needed for nerves 03/03/13   Noralee Space, MD  amoxicillin (AMOXIL) 500 MG capsule Take 1 capsule (500 mg total) by mouth 3 (three) times daily. 11/11/13   Hendricks Limes, MD  aspirin EC 81 MG tablet Take 81 mg by mouth daily.    Historical Provider, MD  atorvastatin (LIPITOR) 40 MG tablet Take 1 tablet (40 mg total) by mouth daily. 01/04/13   Noralee Space, MD  budesonide-formoterol (SYMBICORT) 80-4.5 MCG/ACT inhaler Inhale 2 puffs into the lungs 2 (two) times daily as needed. For shortness of breath 10/05/13   Noralee Space, MD  Calcium Carbonate-Vitamin D (CALTRATE 600+D) 600-400 MG-UNIT per tablet Take 1 tablet by mouth daily. For bone health    Historical Provider, MD  Cholecalciferol (VITAMIN D) 1000 UNITS capsule Take 1,000 Units by mouth daily.      Historical Provider, MD  fish oil-omega-3 fatty acids 1000 MG capsule Take 1 g by mouth 2 (two) times daily.     Historical Provider, MD  losartan (COZAAR) 100 MG tablet TAKE 1 TABLET BY MOUTH DAILY 09/23/13   Noralee Space, MD  omeprazole (PRILOSEC) 20 MG capsule Take 1 capsule (20 mg total) by mouth daily. 01/04/13   Noralee Space, MD  polyethylene glycol (MIRALAX / Floria Raveling) packet Take 17 g by  mouth daily as needed. For constipation    Historical Provider, MD  traMADol (ULTRAM) 50 MG tablet Take 1 tablet (50 mg total) by mouth 3 (three) times daily as needed for pain. 03/02/13   Noralee Space, MD   BP 129/78  Pulse 75  Temp(Src) 97.7 F (36.5 C) (Oral)  Resp 18  SpO2 100% Physical Exam  Nursing note and vitals reviewed. Constitutional: She is oriented to person, place, and time. She appears well-developed and well-nourished. No distress.  HENT:  Head: Normocephalic and atraumatic.  Right Ear: External ear normal.  Left Ear: External ear normal.  Nose: Nose normal.  Eyes: Right eye exhibits no discharge. Left eye exhibits no discharge.  Cardiovascular: Normal rate, regular rhythm and normal heart sounds.   Pulmonary/Chest: Effort normal and breath sounds normal.  Abdominal: Soft. Normal appearance. There is tenderness in the right lower quadrant, suprapubic area and left lower quadrant.  Genitourinary:  Large amount of soft stool in vault. No gross blood  Musculoskeletal:       Lumbar back: She exhibits no bony tenderness.       Back:  No midline or bony back tenderness  Neurological: She is alert and oriented to person, place, and time.  Skin: Skin is warm and dry.    ED Course  Procedures (including critical care time) Labs Review Labs Reviewed  BASIC METABOLIC PANEL - Abnormal; Notable for the following:    GFR calc non Af Amer 74 (*)    GFR calc Af Amer 85 (*)    All other components within normal limits  URINALYSIS, ROUTINE W REFLEX MICROSCOPIC - Abnormal; Notable for the following:    Hgb urine dipstick TRACE (*)    All other components within normal limits  CBC  URINE MICROSCOPIC-ADD ON    Imaging Review Dg Abd 1 View  11/24/2013   CLINICAL DATA:  Constipation, lower back pain  EXAM: ABDOMEN - 1 VIEW  COMPARISON:  04/10/2009  FINDINGS: Nonobstructive bowel gas pattern.  Moderate colonic stool burden, suggesting constipation.  Lumbar dextroscoliosis with  moderate degenerative changes.  IMPRESSION: Moderate colonic stool burden, suggesting constipation.   Electronically Signed   By: Julian Hy M.D.   On: 11/24/2013 11:08     EKG Interpretation None      MDM   Final diagnoses:  Constipation    Patient's symptoms resolved after being given an enema in ED with large bowel movement. No more abd or back pain. No evidence of UTI or electrolyte abnormality. At this time will recommend colace and miralax and discharge home with return precautions.     Ephraim Hamburger, MD 11/24/13 1355

## 2013-11-24 NOTE — ED Notes (Addendum)
Pt from home with son.  States that she lives by herself and has been having trouble with constipation.  States her last BM was on Sunday and it was hard an small.  States that she is having low back pain.  Denies abd pain.  Also had her teeth pulled two weeks ago and states she hasn't been eating as much.

## 2013-11-24 NOTE — Discharge Instructions (Signed)

## 2013-12-15 ENCOUNTER — Ambulatory Visit: Payer: Medicare Other | Admitting: Internal Medicine

## 2013-12-16 ENCOUNTER — Ambulatory Visit (INDEPENDENT_AMBULATORY_CARE_PROVIDER_SITE_OTHER): Payer: Medicare Other | Admitting: Internal Medicine

## 2013-12-16 ENCOUNTER — Encounter: Payer: Self-pay | Admitting: Internal Medicine

## 2013-12-16 ENCOUNTER — Other Ambulatory Visit: Payer: Self-pay | Admitting: Internal Medicine

## 2013-12-16 VITALS — BP 140/86 | HR 62 | Temp 97.8°F | Wt 126.0 lb

## 2013-12-16 DIAGNOSIS — J449 Chronic obstructive pulmonary disease, unspecified: Secondary | ICD-10-CM

## 2013-12-16 DIAGNOSIS — K589 Irritable bowel syndrome without diarrhea: Secondary | ICD-10-CM

## 2013-12-16 DIAGNOSIS — K5730 Diverticulosis of large intestine without perforation or abscess without bleeding: Secondary | ICD-10-CM

## 2013-12-16 DIAGNOSIS — K5900 Constipation, unspecified: Secondary | ICD-10-CM

## 2013-12-16 DIAGNOSIS — M545 Low back pain, unspecified: Secondary | ICD-10-CM

## 2013-12-16 DIAGNOSIS — M1990 Unspecified osteoarthritis, unspecified site: Secondary | ICD-10-CM

## 2013-12-16 DIAGNOSIS — M159 Polyosteoarthritis, unspecified: Secondary | ICD-10-CM

## 2013-12-16 DIAGNOSIS — M150 Primary generalized (osteo)arthritis: Secondary | ICD-10-CM

## 2013-12-16 NOTE — Progress Notes (Signed)
Pre visit review using our clinic review tool, if applicable. No additional management support is needed unless otherwise documented below in the visit note. 

## 2013-12-16 NOTE — Patient Instructions (Addendum)
Please take a probiotic , Florastor OR Align, every day until the bowels are normal. This will replace the normal bacteria which  are necessary for formation of normal stool and processing of food. Metamucil  & Miralax every day as needed for constipation. Generic Colace or Peri-Colace are good stool softeners. Drink to thirst, up to 32 ounces of fluids daily [facility]  referral will be scheduled and you'll be notified of the time. Handicapped form completed.

## 2013-12-16 NOTE — Progress Notes (Signed)
   Subjective:    Patient ID: Kathryn Whitaker, female    DOB: 11/10/1919, 80 y.o.   MRN: [id number]  HPI  She is here to follow-up from emergency room visit 11/24/13. She was seen for back pain which was attributed to constipation. Imaging revealed moderate stool retention. She was given enema which relieved her back pain and was sent home on Colace and milligrams twice a day and MiraLAX daily. She continues have constipation. She states she's had only two bowel movement since the emergency room visit. She senses stool urgency but is unable to go even with straining. She's had two BMs which were hard and small. There is no associated melanoma or rectal bleeding.Constipation is a chronic issue for her. She has taken Dulcolax in the past with some benefit. She has been staying well hydrated. She's also used natural modalities such as eating cooked prunes. She is not on a strong narcotic. PMH of diverticulosis.    Review of Systems She states she has no home support and is requesting home health aides.  She has COPD as well as advanced degenerative joint disease. These inhibit her mobility; she requires assistance for mobilization outside the home to prevent falls and possible musculoskeletal injury.       Objective:   Physical Exam  Significant or distinguishing  findings on physical exam are documented first.  Below that are other systems examined & findings. She has patchy alopecia. She has a few remaining teeth. There is mild plaque formation at the base the teeth. Minor rales noted throughout the lung fields. Breath sounds are  generally decreased. Heart sounds are distant. Abdomen is doughy; bowel sounds are hyperactive. There's some tenderness and epigastric area without a definitive mass. Limbs reveal atrophy. Strength and tone are decreased. Ambulation is slow with standby assistance to prevent falling   General appearance:adequate nourishment w/o distress.  Eyes: No conjunctival  inflammation or scleral icterus is present.  Oral exam: There is no oropharyngeal erythema or exudate noted.   Heart:  Normal rate and regular rhythm. S1 and S2 normal without gallop, murmur, click, rub or other extra sounds     Lungs:No increased work of breathing @ rest.   Skin:Warm & dry.  Intact without suspicious lesions or rashes ; no jaundice . Slight tenting  Lymphatic: No lymphadenopathy is noted about the head, neck, axilla areas.                Assessment & Plan:  See Diagnoses & AVS

## 2013-12-16 NOTE — Progress Notes (Signed)
   Subjective:    Patient ID: Kathryn Whitaker, female    DOB: 02/22/1920, 80 y.o.   MRN: [id number]  HPI The pt presents today for f/u since an ER visit on 11/24/13. In the ER she was seen for back pain, and was found to have constipation. Her AXR showed moderate colonic stool. She was given an enema, which relieved her back pain and was sent home with colace 100mg  BID and miralax daily.   Today the pt is continuing to have constipation. She states she has only had 2 bowel movements since her ER visit, the most recent of which was 4 days ago. The pt feels like she needs to have a bowel movement but states she is unable to go. She strains to have a BM. The 2 BMs she's had were hard and formed. They were brown, without blood. The pt is unable to state what her normal BM habits are prior to this event because she reports constipation is somewhat of a chronic issue for her.  The pt states she has a history of problems with her bowels. She has taken dulcolax in the past that was helpful. She reports she is drinking plenty of water and has tried some cooked prunes. She does not take her tramadol regularly. She would like to be more aggressive about solving her constipation issue and is asking about home enemas.    Review of Systems  Gastrointestinal: Negative for nausea, blood in stool and rectal pain.      Objective:   Physical Exam Gen.: Pt is thin in appearance but adequately nourished. Alert, appropriate and cooperative throughout exam. Appears younger than stated age   55: No upper teeth present without dentures.   Lungs: Normal respiratory effort; chest expands symmetrically. Lungs are clear to auscultation, decreased in the bases bilaterally.   Heart: Normal rate and rhythm. Normal S1 and S2. No gallop, click, or rub. Systolic murmur.  Abdomen: Bowel sounds are hyperactive; abdomen soft. Abd is tender to palpation superior to umbilicus. There is a mobile soft cyst like mass superior and  to the L of the umbilicus.                                 Musculoskeletal/extremities: Moderate kyphosis.    Skin: Intact without suspicious lesions or rashes. Vertical abdominal scar. Loose and extra skin in abdomen.                                                                                  Assessment & Plan:  #1 constipation; consider adding PO dulcolax. Would consider stopping the colace. Could possibly give lactulose as an alternative for miralax or as an additive agent.

## 2013-12-17 DIAGNOSIS — K5900 Constipation, unspecified: Secondary | ICD-10-CM | POA: Insufficient documentation

## 2013-12-20 ENCOUNTER — Other Ambulatory Visit: Payer: Self-pay | Admitting: Pulmonary Disease

## 2013-12-29 ENCOUNTER — Other Ambulatory Visit: Payer: Self-pay | Admitting: Pulmonary Disease

## 2013-12-30 ENCOUNTER — Other Ambulatory Visit: Payer: Self-pay | Admitting: Internal Medicine

## 2013-12-30 NOTE — Telephone Encounter (Signed)
This medication is among those which experts have documented to have a very  high risk of affecting  mental  alertness  & balance. This results in increased risk of falling with serious health or life threatening injury. Such medication should be taken as infrequently as possible and @  the lowest possible dose.It should not be taken with alcohol, sedatives  or other agents which have a similar  adverse risk potential. These risks are greater as we age as there is decreased ability of the liver and kidneys to metabolize and excrete the medication, resulting in   increased blood levels of the active ingredient. She'll need to discuss this with Dr Lenna Gilford who Rxed this.  Scott, I saw her as acute visit. I did agree to accept her as patient. My practice has been closed by [employer]. I feel very uncomfortable about maintenance Xanx in 80 yo. Thanks, [employer]

## 2013-12-30 NOTE — Telephone Encounter (Signed)
Patient is requesting a new  Script for xanax.  Please advise.

## 2014-01-03 ENCOUNTER — Emergency Department (HOSPITAL_COMMUNITY): Payer: Medicare Other

## 2014-01-03 ENCOUNTER — Encounter (HOSPITAL_COMMUNITY): Payer: Self-pay | Admitting: Emergency Medicine

## 2014-01-03 ENCOUNTER — Emergency Department (HOSPITAL_COMMUNITY)
Admission: EM | Admit: 2014-01-03 | Discharge: 2014-01-03 | Disposition: A | Discharge status: Home / Self Care | Payer: Medicare Other | Attending: Emergency Medicine | Admitting: Emergency Medicine

## 2014-01-03 DIAGNOSIS — F410 Panic disorder [episodic paroxysmal anxiety] without agoraphobia: Secondary | ICD-10-CM | POA: Insufficient documentation

## 2014-01-03 DIAGNOSIS — M1990 Unspecified osteoarthritis, unspecified site: Secondary | ICD-10-CM | POA: Insufficient documentation

## 2014-01-03 DIAGNOSIS — Z860100 Personal history of colon polyps, unspecified: Secondary | ICD-10-CM | POA: Insufficient documentation

## 2014-01-03 DIAGNOSIS — E7800 Pure hypercholesterolemia, unspecified: Secondary | ICD-10-CM | POA: Insufficient documentation

## 2014-01-03 DIAGNOSIS — F419 Anxiety disorder, unspecified: Secondary | ICD-10-CM

## 2014-01-03 DIAGNOSIS — Z87440 Personal history of urinary (tract) infections: Secondary | ICD-10-CM | POA: Insufficient documentation

## 2014-01-03 DIAGNOSIS — J45901 Unspecified asthma with (acute) exacerbation: Secondary | ICD-10-CM | POA: Insufficient documentation

## 2014-01-03 DIAGNOSIS — I10 Essential (primary) hypertension: Secondary | ICD-10-CM | POA: Insufficient documentation

## 2014-01-03 DIAGNOSIS — Z7982 Long term (current) use of aspirin: Secondary | ICD-10-CM | POA: Insufficient documentation

## 2014-01-03 DIAGNOSIS — Z8673 Personal history of transient ischemic attack (TIA), and cerebral infarction without residual deficits: Secondary | ICD-10-CM | POA: Insufficient documentation

## 2014-01-03 DIAGNOSIS — R0602 Shortness of breath: Secondary | ICD-10-CM

## 2014-01-03 DIAGNOSIS — E559 Vitamin D deficiency, unspecified: Secondary | ICD-10-CM | POA: Insufficient documentation

## 2014-01-03 DIAGNOSIS — K219 Gastro-esophageal reflux disease without esophagitis: Secondary | ICD-10-CM | POA: Insufficient documentation

## 2014-01-03 DIAGNOSIS — Z79899 Other long term (current) drug therapy: Secondary | ICD-10-CM | POA: Insufficient documentation

## 2014-01-03 LAB — BASIC METABOLIC PANEL
ANION GAP: 15 (ref 5–15)
BUN: 15 mg/dL (ref 6–23)
CHLORIDE: 102 meq/L (ref 96–112)
CO2: 23 mEq/L (ref 19–32)
Calcium: 10.1 mg/dL (ref 8.4–10.5)
Creatinine, Ser: 0.67 mg/dL (ref 0.50–1.10)
GFR calc non Af Amer: 74 mL/min — ABNORMAL LOW (ref >90)
GFR, EST AFRICAN AMERICAN: 86 mL/min — AB (ref >90)
Glucose, Bld: 88 mg/dL (ref 70–99)
Potassium: 3.9 mEq/L (ref 3.7–5.3)
SODIUM: 140 meq/L (ref 137–147)

## 2014-01-03 LAB — CBC
HCT: 39.4 % (ref 36.0–46.0)
Hemoglobin: 13 g/dL (ref 12.0–15.0)
MCH: 30.4 pg (ref 26.0–34.0)
MCHC: 33 g/dL (ref 30.0–36.0)
MCV: 92.1 fL (ref 78.0–100.0)
PLATELETS: 211 10*3/uL (ref 150–400)
RBC: 4.28 MIL/uL (ref 3.87–5.11)
RDW: 13.3 % (ref 11.5–15.5)
WBC: 4.4 10*3/uL (ref 4.0–10.5)

## 2014-01-03 LAB — I-STAT TROPONIN, ED: Troponin i, poc: 0.01 ng/mL (ref 0.00–0.08)

## 2014-01-03 LAB — PRO B NATRIURETIC PEPTIDE: PRO B NATRI PEPTIDE: 186.2 pg/mL (ref 0–450)

## 2014-01-03 MED ORDER — ALPRAZOLAM 0.25 MG PO TABS: 0.2500 mg | ORAL_TABLET | Freq: Two times a day (BID) | ORAL | Status: DC | PRN

## 2014-01-03 NOTE — Discharge Instructions (Signed)

## 2014-01-03 NOTE — ED Notes (Signed)
Pt reports to ED for SOB, weakness, chest pain starting 1 hour ago.

## 2014-01-03 NOTE — ED Provider Notes (Signed)
CSN: [id number]     Arrival date & time 01/03/14  1833 History   First MD Initiated Contact with Patient 01/03/14 2019     Chief Complaint  Patient presents with  . Shortness of Breath     (Consider location/radiation/quality/duration/timing/severity/associated sxs/prior Treatment) HPI Comments: Patient presents to the ER for evaluation of shortness of breath. Patient had sudden onset of anxiety, shortness of breath and chest discomfort. Symptoms began approximately an hour before coming to the ER. Since arriving, however, she has improved. Her chest pain and shortness of breath have completely resolved. She is now back to her normal baseline.  Patient reports that she does have a history of anxiety and panic attacks. She was previously on Xanax, but her new doctor as taken her off of Xanax and she likely had a panic attack earlier, according to her and family.  Patient is a 80 y.o. female presenting with shortness of breath.  Shortness of Breath Associated symptoms: chest pain     Past Medical History  Diagnosis Date  . Allergic rhinitis   . Acute asthmatic bronchitis   . Hypertension   . Hypercholesteremia   . GERD (gastroesophageal reflux disease)   . Diverticulosis of colon   . IBS (irritable bowel syndrome)   . History of colonic polyps   . UTI (lower urinary tract infection)   . DJD (degenerative joint disease)   . Lumbar back pain   . Lacunar infarction   . Vitamin D deficiency   . Anxiety    Past Surgical History  Procedure Laterality Date  . Abdominal hysterectomy     History reviewed. No pertinent family history. History  Substance Use Topics  . Smoking status: Never Smoker   . Smokeless tobacco: Never Used  . Alcohol Use: No   OB History   Grav Para Term Preterm Abortions TAB SAB Ect Mult Living                 Review of Systems  Respiratory: Positive for shortness of breath.   Cardiovascular: Positive for chest pain.  Psychiatric/Behavioral: The  patient is nervous/anxious.       Allergies  Demerol; Influenza vaccines; and Telithromycin  Home Medications   Prior to Admission medications   Medication Sig Start Date End Date Taking? Authorizing Provider  acetaminophen (TYLENOL) 325 MG tablet Take 2 tablets (650 mg total) by mouth every 6 (six) hours as needed. 12/14/12   Venetia Maxon Rama, MD  albuterol (PROVENTIL HFA) 108 (90 BASE) MCG/ACT inhaler Inhale 2 puffs into the lungs every 6 (six) hours as needed. For shortness of breath 10/05/13   Noralee Space, MD  ALPRAZolam Duanne Moron) 0.25 MG tablet Take 1/2 to 1 tablet by mouth three times daily as needed for nerves 03/03/13   Noralee Space, MD  aspirin EC 81 MG tablet Take 81 mg by mouth daily.    Historical Provider, MD  atorvastatin (LIPITOR) 40 MG tablet Take 1 tablet (40 mg total) by mouth daily. 01/04/13   Noralee Space, MD  budesonide-formoterol (SYMBICORT) 80-4.5 MCG/ACT inhaler Inhale 2 puffs into the lungs 2 (two) times daily as needed. For shortness of breath 10/05/13   Noralee Space, MD  Calcium Carbonate-Vitamin D (CALTRATE 600+D) 600-400 MG-UNIT per tablet Take 1 tablet by mouth daily. For bone health    Historical Provider, MD  chlorhexidine (PERIDEX) 0.12 % solution  12/13/13   Historical Provider, MD  Cholecalciferol (VITAMIN D) 1000 UNITS capsule Take 1,000 Units by mouth  daily.      Historical Provider, MD  docusate sodium (COLACE) 100 MG capsule Take 1 capsule (100 mg total) by mouth every 12 (twelve) hours. 11/24/13   Ephraim Hamburger, MD  fish oil-omega-3 fatty acids 1000 MG capsule Take 1 g by mouth 2 (two) times daily.     Historical Provider, MD  lidocaine (XYLOCAINE) 2 % solution  12/16/13   Historical Provider, MD  losartan (COZAAR) 100 MG tablet Take 100 mg by mouth daily.    Historical Provider, MD  omeprazole (PRILOSEC) 20 MG capsule TAKE ONE CAPSULE DAILY TAKE 30 MINUTES BEFORE FIRST MEAL OF THE DAY 12/20/13   Noralee Space, MD  polyethylene glycol [facility] / GLYCOLAX)  packet Take 17 g by mouth daily. 11/24/13   Ephraim Hamburger, MD  traMADol (ULTRAM) 50 MG tablet Take 1 tablet (50 mg total) by mouth 3 (three) times daily as needed for pain. 03/02/13   Noralee Space, MD   BP 170/91  Pulse 71  Temp(Src) 98.1 F (36.7 C) (Oral)  Resp 28  SpO2 99% Physical Exam  Constitutional: She is oriented to person, place, and time. She appears well-developed and well-nourished. No distress.  HENT:  Head: Normocephalic and atraumatic.  Right Ear: Hearing normal.  Left Ear: Hearing normal.  Nose: Nose normal.  Mouth/Throat: Oropharynx is clear and moist and mucous membranes are normal.  Eyes: Conjunctivae and EOM are normal. Pupils are equal, round, and reactive to light.  Neck: Normal range of motion. Neck supple.  Cardiovascular: Regular rhythm, S1 normal and S2 normal.  Exam reveals no gallop and no friction rub.   No murmur heard. Pulmonary/Chest: Effort normal and breath sounds normal. No respiratory distress. She exhibits no tenderness.  Abdominal: Soft. Normal appearance and bowel sounds are normal. There is no hepatosplenomegaly. There is no tenderness. There is no rebound, no guarding, no tenderness at McBurney's point and negative Murphy's sign. No hernia.  Musculoskeletal: Normal range of motion.  Neurological: She is alert and oriented to person, place, and time. She has normal strength. No cranial nerve deficit or sensory deficit. Coordination normal. GCS eye subscore is 4. GCS verbal subscore is 5. GCS motor subscore is 6.  Skin: Skin is warm, dry and intact. No rash noted. No cyanosis.  Psychiatric: She has a normal mood and affect. Her speech is normal and behavior is normal. Thought content normal.    ED Course  Procedures (including critical care time) Labs Review Labs Reviewed  BASIC METABOLIC PANEL - Abnormal; Notable for the following:    GFR calc non Af Amer 74 (*)    GFR calc Af Amer 86 (*)    All other components within normal limits  CBC   PRO B NATRIURETIC PEPTIDE  I-STAT TROPOININ, ED    Imaging Review Dg Chest Port 1 View  01/03/2014   CLINICAL DATA:  Shortness of Breath  EXAM: PORTABLE CHEST - 1 VIEW  COMPARISON:  October 05, 2013  FINDINGS: There is underlying emphysematous change. There is no edema or consolidation. The heart size and pulmonary vascularity are within normal limits. Aorta is mildly tortuous but stable. No adenopathy. No bone lesions. There is thoracic levoscoliosis.  IMPRESSION: Underlying emphysematous change.  No edema consolidation.   Electronically Signed   By: Lowella Grip M.D.   On: 01/03/2014 19:52     EKG Interpretation   Date/Time:  Monday January 03 2014 18:42:00 EDT Ventricular Rate:  70 PR Interval:  174 QRS Duration: 98  QT Interval:  409 QTC Calculation: 441 R Axis:   -47 Text Interpretation:  Sinus rhythm LAD, consider left anterior fascicular  block Abnormal R-wave progression, early transition Anteroseptal infarct,  old No significant change since last tracing Confirmed by Devantae Babe  MD,  [facility] [phone]) on 01/03/2014 8:54:54 PM      MDM   Final diagnoses:  None    Patient presented with shortness of breath and tightness in the chest that began with anxiety and panic attack. Symptoms have spontaneously resolved. Her EKG is unchanged from previous. Troponin was negative. Chest x-ray is clear. Vital signs are now normal. Oxygenation 98% on room air. Patient reports that her breathing is back to normal. Symptoms felt like when she has had panic attacks previously. She has been off of her Xanax. We will prescribe a limited supply of Xanax to be used only as needed, followup with primary doctor.    Orpah Greek, MD 01/03/14 2056

## 2014-01-19 ENCOUNTER — Other Ambulatory Visit: Payer: Self-pay | Admitting: Pulmonary Disease

## 2014-02-01 ENCOUNTER — Ambulatory Visit (INDEPENDENT_AMBULATORY_CARE_PROVIDER_SITE_OTHER): Payer: BC Managed Care – HMO | Admitting: Podiatry

## 2014-02-01 ENCOUNTER — Encounter: Payer: Self-pay | Admitting: Podiatry

## 2014-02-01 VITALS — BP 151/81 | HR 60 | Ht 61.5 in | Wt 123.0 lb

## 2014-02-01 DIAGNOSIS — M79609 Pain in unspecified limb: Secondary | ICD-10-CM

## 2014-02-01 DIAGNOSIS — R234 Changes in skin texture: Secondary | ICD-10-CM | POA: Insufficient documentation

## 2014-02-01 DIAGNOSIS — B351 Tinea unguium: Secondary | ICD-10-CM | POA: Insufficient documentation

## 2014-02-01 DIAGNOSIS — M79606 Pain in leg, unspecified: Secondary | ICD-10-CM | POA: Insufficient documentation

## 2014-02-01 DIAGNOSIS — L988 Other specified disorders of the skin and subcutaneous tissue: Secondary | ICD-10-CM

## 2014-02-01 NOTE — Progress Notes (Signed)
SUBJECTIVE: 80 y.o. year old female presents complaining of painful corns, calluses and toe nails.   OBJECTIVE: DERMATOLOGIC EXAMINATION: Nails: All nails are thick and hypertrophic.  Skin Integrity: dry thick scaly callus with cracking on left heel. Calluses: Multiple plantar calluses bilateral. Interdigital corn 4th web space bilateral.   VASCULAR EXAMINATION OF LOWER LIMBS:  Pedal pulses: All pedal pulses are faintly palpable..  No edema or erythema. Temperature gradient from tibial crest to dorsum of foot is within normal bilateral.  NEUROLOGIC EXAMINATION OF THE LOWER LIMBS: All epicritic and tactile sensations grossly intact.   ORTHOPEDIC: Multiple contracted lesser digits bilateral. Enlarged bunion bilateral.   ASSESSMENT: Onychomycosis x 10. Painful plantar calluses bilateral with cracked heel left.  PLAN: All nails and calluses debrided. Return in one month to soak and debride left heel.

## 2014-02-01 NOTE — Patient Instructions (Signed)
Seen for painful corns, calluses and nails. All debrided. Return in one month to repeat trimming dry cracking callus with soak. May benefit from Vitamin A cream.

## 2014-03-04 ENCOUNTER — Encounter: Payer: Self-pay | Admitting: Podiatry

## 2014-03-04 ENCOUNTER — Ambulatory Visit (INDEPENDENT_AMBULATORY_CARE_PROVIDER_SITE_OTHER): Payer: BC Managed Care – HMO | Admitting: Podiatry

## 2014-03-04 VITALS — BP 126/77 | HR 77 | Ht 61.5 in | Wt 123.0 lb

## 2014-03-04 DIAGNOSIS — M79609 Pain in unspecified limb: Secondary | ICD-10-CM

## 2014-03-04 DIAGNOSIS — L988 Other specified disorders of the skin and subcutaneous tissue: Secondary | ICD-10-CM

## 2014-03-04 DIAGNOSIS — R234 Changes in skin texture: Secondary | ICD-10-CM

## 2014-03-04 DIAGNOSIS — M79606 Pain in leg, unspecified: Secondary | ICD-10-CM

## 2014-03-04 NOTE — Progress Notes (Signed)
Subjective: 80 y.o. year old female presents complaining of painful calluses.   OBJECTIVE:  DERMATOLOGIC EXAMINATION:  Dry thick scaly callus with cracking on left heel with multiple plantar calluses bilateral.  VASCULAR EXAMINATION OF LOWER LIMBS:  Pedal pulses: All pedal pulses are faintly palpable..  No edema or erythema. Temperature gradient from tibial crest to dorsum of foot is within normal bilateral.  NEUROLOGIC EXAMINATION OF THE LOWER LIMBS:  All epicritic and tactile sensations grossly intact.  ORTHOPEDIC: Multiple contracted lesser digits bilateral.  Enlarged bunion bilateral.   ASSESSMENT:  Painful plantar calluses bilateral with cracked heel left.   PLAN:  Both feet soaked for 10 minutes and all dry cracking calluses debrided. Return in 2 month for RFC.

## 2014-03-04 NOTE — Patient Instructions (Signed)
Seen for dry cracking calluses. Soaked and debrided all calluses. Return in 2 months.

## 2014-03-31 ENCOUNTER — Ambulatory Visit (INDEPENDENT_AMBULATORY_CARE_PROVIDER_SITE_OTHER): Payer: Medicare Other | Admitting: Internal Medicine

## 2014-03-31 ENCOUNTER — Encounter: Payer: Self-pay | Admitting: Internal Medicine

## 2014-03-31 VITALS — BP 132/82 | HR 85 | Temp 97.4°F | Resp 12 | Wt 114.1 lb

## 2014-03-31 DIAGNOSIS — K5909 Other constipation: Secondary | ICD-10-CM

## 2014-03-31 DIAGNOSIS — E785 Hyperlipidemia, unspecified: Secondary | ICD-10-CM

## 2014-03-31 DIAGNOSIS — F411 Generalized anxiety disorder: Secondary | ICD-10-CM

## 2014-03-31 DIAGNOSIS — K5900 Constipation, unspecified: Secondary | ICD-10-CM

## 2014-03-31 DIAGNOSIS — J42 Unspecified chronic bronchitis: Secondary | ICD-10-CM

## 2014-03-31 DIAGNOSIS — I10 Essential (primary) hypertension: Secondary | ICD-10-CM

## 2014-03-31 MED ORDER — EMOLLIENT BASE EX CREA: TOPICAL_CREAM | Freq: Every day | CUTANEOUS | Status: DC

## 2014-03-31 MED ORDER — LUBIPROSTONE 24 MCG PO CAPS: 24.0000 ug | ORAL_CAPSULE | Freq: Two times a day (BID) | ORAL | Status: DC

## 2014-03-31 MED ORDER — MIRTAZAPINE 15 MG PO TABS: 15.0000 mg | ORAL_TABLET | Freq: Every day | ORAL | Status: DC

## 2014-03-31 NOTE — Progress Notes (Signed)
Pre visit review using our clinic review tool, if applicable. No additional management support is needed unless otherwise documented below in the visit note. 

## 2014-03-31 NOTE — Patient Instructions (Signed)
We will give you a medicine to help with your bowels called amitiza (also called lubiprostone). Take 1 pill with breakfast and 1 pill with dinner everyday. This should help to make you more regular but it will not make you go immediately. If you have been several days without a bowel movement the best medicine to take is called miralax. This will help you to go in 1-2 days. If you are having problems with no bowel movement please call us.   Keep taking the stool softener daily. Also keep taking the fiber daily. Make sure to drink about 6-8 glasses of water or coffee daily to get enough water in your gut.   We will give you a cream for your rash. We will also give you a medicine for sleep that you can take at night time. It is called remeron (also called mirtazepine). Take 1 pill about 1 hour before you want to go to sleep and this will help to keep you asleep at night time. Call us if you have any problems with it.   Come back in about 3-6 months. We will check you for diabetes today and call you with the results.

## 2014-04-01 ENCOUNTER — Encounter: Payer: Self-pay | Admitting: Internal Medicine

## 2014-04-01 NOTE — Assessment & Plan Note (Signed)
She does use her symbicort and not having to use the albuterol at this time.

## 2014-04-01 NOTE — Progress Notes (Signed)
   Subjective:    Patient ID: Kathryn Whitaker, female    DOB: 09/11/1919, 80 y.o.   MRN: [id number]  HPI The patient is a 80 YO female who is coming in today to establish care. She is still having the chronic constipation she has struggled with for years. She denies nausea or vomiting but only goes about every 4-5 days. When she does go it is hard. She denies diarrhea. She is taking fiber at this time and it is unclear if she is taking something else for it. She also wants something for the itching and crawling on her skin. Sometimes she feels as though bugs are crawling on her skin. She is having some problem with waking up at night and wants something for that. She is losing weight but still insists that her appetite is good and she eats 3 good meals per day. She lives alone but her sister is here with her to help provide history.   Review of Systems  Constitutional: Negative for fever, activity change, appetite change and fatigue.  HENT: Negative.   Eyes: Negative.   Respiratory: Negative for cough, chest tightness, shortness of breath and wheezing.   Cardiovascular: Negative for chest pain, palpitations and leg swelling.  Gastrointestinal: Positive for constipation. Negative for nausea, vomiting, abdominal pain, diarrhea, blood in stool and abdominal distention.  Neurological: Negative.   Psychiatric/Behavioral: Positive for sleep disturbance.      Objective:   Physical Exam  Vitals reviewed. Constitutional: She is oriented to person, place, and time. She appears well-developed and well-nourished.  Thin  HENT:  Head: Normocephalic and atraumatic.  Eyes: EOM are normal.  Neck: Normal range of motion.  Cardiovascular: Normal rate and regular rhythm.   Pulmonary/Chest: Effort normal and breath sounds normal. No respiratory distress. She has no wheezes. She has no rales.  Abdominal: Soft. Bowel sounds are normal. She exhibits no distension and no mass. There is no tenderness. There is no  rebound.  Neurological: She is alert and oriented to person, place, and time. Coordination normal.  Skin: Skin is warm and dry.   Filed Vitals:   03/31/14 1012  BP: 132/82  Pulse: 85  Temp: 97.4 F (36.3 C)  TempSrc: Oral  Resp: 12  Weight: 114 lb 1.9 oz (51.764 kg)  SpO2: 97%      Assessment & Plan:

## 2014-04-01 NOTE — Assessment & Plan Note (Signed)
BP good today and per patient she is taking losartan 50 mg daily.

## 2014-04-01 NOTE — Assessment & Plan Note (Signed)
Given her concern about weight loss will pick agent that increases hunger. Start mirtazepine 25 mg qhs.

## 2014-04-01 NOTE — Assessment & Plan Note (Signed)
Continue lipitor  ?

## 2014-04-01 NOTE — Assessment & Plan Note (Addendum)
She has been struggling with this for years. Will trial amitiza and encouraged 6-8 glasses water per day. She will also continue taking fiber daily. She was describing miralax but unclear if she is taking this. She would like to see GI and in the setting of recent weight loss I think that is reasonable.

## 2014-04-06 ENCOUNTER — Ambulatory Visit (INDEPENDENT_AMBULATORY_CARE_PROVIDER_SITE_OTHER): Payer: Medicare Other | Admitting: Internal Medicine

## 2014-04-06 ENCOUNTER — Encounter: Payer: Self-pay | Admitting: Internal Medicine

## 2014-04-06 VITALS — BP 120/90 | HR 72 | Ht 61.5 in | Wt 114.8 lb

## 2014-04-06 DIAGNOSIS — K5900 Constipation, unspecified: Secondary | ICD-10-CM

## 2014-04-06 MED ORDER — POLYETHYLENE GLYCOL 3350 17 GM/SCOOP PO POWD
ORAL | Status: DC
Start: 2014-04-06 — End: 2014-04-23

## 2014-04-06 NOTE — Progress Notes (Signed)
Patient ID: Kathryn Whitaker, female   DOB: 10-Jul-1919, 80 y.o.   MRN: [id number]    HPI: Kathryn Whitaker is a delightful 80 year old female referred for evaluation of chronic constipation by Dr. Wayne Sever. Kathryn Whitaker states she has been troubled with constipation for 6 or 7 years. She says she skips 3-4 days between bowel movements and then passes hard, nugget like stools. In the past she has tried fiber supplements with very little relief. She has episodically tried MiraLAX with some relief, but she says she was told he had a lot of acid in neck and social stopped using it. Over the past year she has had 2 visits to the emergency room 2 to lower abdominal and low back pain. She had abdominal x-rays that showed moderate colonic stool load. She recently was seen by her primary care physician who prescribed in tease. Unfortunately, the patient has no transportation and has not been able to have her obtain her prescription from the pharmacy. Her last bowel movement was approximately 4 days ago. She denies any bright red blood per done and has not had any black tarry stools she says she had a colonoscopy many years ago which time a small polyp was removed. She has not had surveillance. Her appetite has been good but she says she has lost 5 or 6 pounds over the past several months. She attributes this to losing most of her teeth. She states her dentures do not well and so she tends to be only soft foods. She reports that she will get lower abdominal crampy pain prior to defecation which is relieved with defecation.   Past Medical History  Diagnosis Date  . Allergic rhinitis   . Acute asthmatic bronchitis   . Hypertension   . Hypercholesteremia   . GERD (gastroesophageal reflux disease)   . Diverticulosis of colon   . IBS (irritable bowel syndrome)   . History of colonic polyps   . UTI (lower urinary tract infection)   . DJD (degenerative joint disease)   . Lumbar back pain   . Lacunar infarction   . Vitamin D  deficiency   . Anxiety     Past Surgical History  Procedure Laterality Date  . Abdominal hysterectomy     History reviewed. No pertinent family history. History  Substance Use Topics  . Smoking status: Never Smoker   . Smokeless tobacco: Never Used  . Alcohol Use: No   Current Outpatient Prescriptions  Medication Sig Dispense Refill  . acetaminophen (TYLENOL) 325 MG tablet Take 2 tablets (650 mg total) by mouth every 6 (six) hours as needed.      Marland Kitchen albuterol (PROVENTIL HFA) 108 (90 BASE) MCG/ACT inhaler Inhale 2 puffs into the lungs every 6 (six) hours as needed. For shortness of breath  1 Inhaler  6  . aspirin EC 81 MG tablet Take 81 mg by mouth daily.      Marland Kitchen atorvastatin (LIPITOR) 40 MG tablet Take 1 tablet (40 mg total) by mouth daily.  30 tablet  5  . budesonide-formoterol (SYMBICORT) 80-4.5 MCG/ACT inhaler Inhale 2 puffs into the lungs 2 (two) times daily as needed. For shortness of breath  1 Inhaler  6  . Calcium Carbonate-Vitamin D (CALTRATE 600+D) 600-400 MG-UNIT per tablet Take 1 tablet by mouth daily. For bone health      . Cholecalciferol (VITAMIN D) 1000 UNITS capsule Take 1,000 Units by mouth daily.        Marland Kitchen docusate sodium (COLACE) 100 MG capsule Take 1  capsule (100 mg total) by mouth every 12 (twelve) hours.  60 capsule  0  . emollient (BIAFINE) cream Apply topically daily.  1700 g  0  . fish oil-omega-3 fatty acids 1000 MG capsule Take 1 g by mouth 2 (two) times daily.       Marland Kitchen losartan (COZAAR) 100 MG tablet Take 100 mg by mouth daily.      . mirtazapine (REMERON) 15 MG tablet Take 1 tablet (15 mg total) by mouth at bedtime.  30 tablet  1  . omeprazole (PRILOSEC) 20 MG capsule Take 20 mg by mouth daily.      . polyethylene glycol powder (GLYCOLAX/MIRALAX) powder Use 1-2 capfuls in juice or water daily  255 g  0   No current facility-administered medications for this visit.   Allergies  Allergen Reactions  . Demerol [Meperidine]     Pt states she can't remember  type of reaction  . Influenza Vaccines     Pt can't remember reaction but states allergic and has not had in 25 years  . Telithromycin Hives    REACTION: pt states HIVES....     Review of Systems: Gen: Denies any fever, chills, sweats, anorexia, fatigue, weakness, malaise, weight loss, and sleep disorder CV: Denies chest pain, angina, palpitations, syncope, orthopnea, PND, peripheral edema, and claudication. Resp: Denies dyspnea at rest, dyspnea with exercise, cough, sputum, wheezing, coughing up blood, and pleurisy. GI: Denies vomiting blood, jaundice, and fecal incontinence.   Denies dysphagia or odynophagia. GU : Denies urinary burning, blood in urine, urinary frequency, urinary hesitancy, nocturnal urination, and urinary incontinence. MS: Denies joint pain, limitation of movement, and swelling, stiffness, low back pain, extremity pain. Denies muscle weakness, cramps, atrophy.  Derm: Denies rash, itching, dry skin, hives, moles, warts, or unhealing ulcers.  Psych: Denies depression, anxiety, memory loss, suicidal ideation, hallucinations, paranoia, and confusion. Heme: Denies bruising, bleeding, and enlarged lymph nodes. Neuro:  Denies any headaches, dizziness, paresthesias. Endo:  Denies any problems with DM, thyroid, adrenal function  Studies: CLINICAL DATA: Constipation, lower back pain  EXAM:  ABDOMEN - 1 VIEW  COMPARISON: 04/10/2009  FINDINGS:  Nonobstructive bowel gas pattern.  Moderate colonic stool burden, suggesting constipation.  Lumbar dextroscoliosis with moderate degenerative changes.  IMPRESSION:  Moderate colonic stool burden, suggesting constipation.  Electronically Signed  By: Julian Hy M.D.  On: 11/24/2013 11:08   Physical Exam: BP 120/90  Pulse 72  Ht 5' 1.5" (1.562 m)  Wt 114 lb 12.8 oz (52.073 kg)  BMI 21.34 kg/m2 Constitutional: Pleasant,well-developed, elderly, AA female in no acute distress. HEENT: Normocephalic and atraumatic.  Conjunctivae are normal. No scleral icterus. Neck supple. No thyromegaly Cardiovascular: Normal rate, regular rhythm.  Pulmonary/chest: Effort normal and breath sounds normal. No wheezing, rales or rhonchi. Abdominal: Soft, nondistended, nontender. Bowel sounds active throughout. There are no masses palpable. No hepatomegaly. Rectal: scant amount soft, brown stool in vault, heme negative Extremities: no edema Lymphadenopathy: No cervical adenopathy noted. Neurological: Alert and oriented to person place and time. Skin: Skin is warm and dry. No rashes noted. Psychiatric: Normal mood and affect. Behavior is normal.  ASSESSMENT AND PLAN: 80 yo female with chronic constipation referred for evaluation. Pt reports that she has had relief of her constipation in the past with daily use of miralax, but discontinued it because she thought continued use could be harmful. Pt has been instructed to use miralax 1-2 capfuls daily in juice or water, and titrate to the desired effect. She will call in 3  weeks to let us know if it is helping. If she has no relief, may be a candiate for amitiza or linzess. Pt's younger sister who accompanied patient said she would help patient get  to the drug store to obtain miralax. Pt will follow up on an as needed basis. Pt has been reviewed with Dr Henrene Pastor.   Estevan Kersh, Deloris Ping 04/06/2014, 12:26 PM  GI ATTENDING Patient personally seen and examined along with physician assistant. Agree with H&P as outlined above. Impression and plan partially formulated.  Docia Chuck. Geri Seminole., M.D. [facility] Division of Gastroenterology

## 2014-04-06 NOTE — Patient Instructions (Signed)
As discussed with Cecille Rubin, mix and then drink 1-2 capfuls of miralax daily.  Increase your fluid intake.    You may use the Colace as needed  Please call our office in 3 weeks to let us know how you are doing.  The phone number is [phone].  Ask for Rosanne Sack, Dr. Blanch Media nurse.

## 2014-04-23 ENCOUNTER — Encounter (HOSPITAL_COMMUNITY): Payer: Self-pay | Admitting: Emergency Medicine

## 2014-04-23 ENCOUNTER — Emergency Department (HOSPITAL_COMMUNITY)
Admission: EM | Admit: 2014-04-23 | Discharge: 2014-04-23 | Disposition: A | Discharge status: Home / Self Care | Payer: Medicare Other | Attending: Emergency Medicine | Admitting: Emergency Medicine

## 2014-04-23 ENCOUNTER — Emergency Department (HOSPITAL_COMMUNITY): Payer: Medicare Other

## 2014-04-23 DIAGNOSIS — K589 Irritable bowel syndrome without diarrhea: Secondary | ICD-10-CM | POA: Insufficient documentation

## 2014-04-23 DIAGNOSIS — F419 Anxiety disorder, unspecified: Secondary | ICD-10-CM | POA: Insufficient documentation

## 2014-04-23 DIAGNOSIS — Z90710 Acquired absence of both cervix and uterus: Secondary | ICD-10-CM | POA: Insufficient documentation

## 2014-04-23 DIAGNOSIS — Z79899 Other long term (current) drug therapy: Secondary | ICD-10-CM | POA: Insufficient documentation

## 2014-04-23 DIAGNOSIS — Z86010 Personal history of colonic polyps: Secondary | ICD-10-CM | POA: Diagnosis not present

## 2014-04-23 DIAGNOSIS — E559 Vitamin D deficiency, unspecified: Secondary | ICD-10-CM | POA: Insufficient documentation

## 2014-04-23 DIAGNOSIS — K219 Gastro-esophageal reflux disease without esophagitis: Secondary | ICD-10-CM | POA: Diagnosis not present

## 2014-04-23 DIAGNOSIS — E780 Pure hypercholesterolemia: Secondary | ICD-10-CM | POA: Insufficient documentation

## 2014-04-23 DIAGNOSIS — M1990 Unspecified osteoarthritis, unspecified site: Secondary | ICD-10-CM | POA: Diagnosis not present

## 2014-04-23 DIAGNOSIS — Z7982 Long term (current) use of aspirin: Secondary | ICD-10-CM | POA: Insufficient documentation

## 2014-04-23 DIAGNOSIS — I10 Essential (primary) hypertension: Secondary | ICD-10-CM | POA: Diagnosis not present

## 2014-04-23 DIAGNOSIS — J45909 Unspecified asthma, uncomplicated: Secondary | ICD-10-CM | POA: Insufficient documentation

## 2014-04-23 DIAGNOSIS — Z87440 Personal history of urinary (tract) infections: Secondary | ICD-10-CM | POA: Insufficient documentation

## 2014-04-23 DIAGNOSIS — K5900 Constipation, unspecified: Secondary | ICD-10-CM | POA: Insufficient documentation

## 2014-04-23 MED ORDER — FLEET ENEMA 7-19 GM/118ML RE ENEM
1.0000 | ENEMA | Freq: Once | RECTAL | Status: AC
  Administered 2014-04-23: 1 via RECTAL
  Filled 2014-04-23: qty 1

## 2014-04-23 MED ORDER — FENTANYL CITRATE 0.05 MG/ML IJ SOLN
50.0000 ug | Freq: Once | INTRAMUSCULAR | Status: AC
  Administered 2014-04-23: 50 ug via INTRAMUSCULAR
  Filled 2014-04-23: qty 2

## 2014-04-23 NOTE — Discharge Instructions (Signed)

## 2014-04-23 NOTE — ED Provider Notes (Signed)
CSN: [id number]     Arrival date & time 04/23/14  1416 History   First MD Initiated Contact with Patient 04/23/14 1458     Chief Complaint  Patient presents with  . Constipation     (Consider location/radiation/quality/duration/timing/severity/associated sxs/prior Treatment) Patient is a 80 y.o. female presenting with constipation. The history is provided by the patient.  Constipation Pain severity now: mild to mod. Time since last bowel movement:  4 days Timing:  Constant Progression:  Unchanged Chronicity:  Recurrent Context comment:  Spontaneously Stool description:  None produced Relieved by:  Nothing Worsened by:  Nothing tried Ineffective treatments: suppository, oral stool softener. Associated symptoms: no abdominal pain, no back pain, no diarrhea, no dysuria, no fever, no nausea and no vomiting     Past Medical History  Diagnosis Date  . Allergic rhinitis   . Acute asthmatic bronchitis   . Hypertension   . Hypercholesteremia   . GERD (gastroesophageal reflux disease)   . Diverticulosis of colon   . IBS (irritable bowel syndrome)   . History of colonic polyps   . UTI (lower urinary tract infection)   . DJD (degenerative joint disease)   . Lumbar back pain   . Lacunar infarction   . Vitamin D deficiency   . Anxiety    Past Surgical History  Procedure Laterality Date  . Abdominal hysterectomy     No family history on file. History  Substance Use Topics  . Smoking status: Never Smoker   . Smokeless tobacco: Never Used  . Alcohol Use: No   OB History   Grav Para Term Preterm Abortions TAB SAB Ect Mult Living                 Review of Systems  Constitutional: Negative for fever and fatigue.  HENT: Negative for congestion and drooling.   Eyes: Negative for pain.  Respiratory: Negative for cough and shortness of breath.   Cardiovascular: Negative for chest pain.  Gastrointestinal: Positive for constipation. Negative for nausea, vomiting, abdominal pain  and diarrhea.  Genitourinary: Negative for dysuria and hematuria.  Musculoskeletal: Negative for back pain, gait problem and neck pain.  Skin: Negative for color change.  Neurological: Negative for dizziness and headaches.  Hematological: Negative for adenopathy.  Psychiatric/Behavioral: Negative for behavioral problems.  All other systems reviewed and are negative.     Allergies  Demerol; Influenza vaccines; and Telithromycin  Home Medications   Prior to Admission medications   Medication Sig Start Date End Date Taking? Authorizing Provider  acetaminophen (TYLENOL) 325 MG tablet Take 2 tablets (650 mg total) by mouth every 6 (six) hours as needed. 12/14/12  Yes Christina P Rama, MD  albuterol (PROVENTIL HFA) 108 (90 BASE) MCG/ACT inhaler Inhale 2 puffs into the lungs every 6 (six) hours as needed. For shortness of breath 10/05/13  Yes Noralee Space, MD  aspirin EC 81 MG tablet Take 81 mg by mouth daily.   Yes Historical Provider, MD  Calcium Carbonate-Vitamin D (CALTRATE 600+D) 600-400 MG-UNIT per tablet Take 1 tablet by mouth daily. For bone health   Yes Historical Provider, MD  fish oil-omega-3 fatty acids 1000 MG capsule Take 1 g by mouth 2 (two) times daily.    Yes Historical Provider, MD  losartan (COZAAR) 100 MG tablet Take 100 mg by mouth daily.   Yes Historical Provider, MD  atorvastatin (LIPITOR) 40 MG tablet Take 1 tablet (40 mg total) by mouth daily. 01/04/13   Noralee Space, MD  budesonide-formoterol (  SYMBICORT) 80-4.5 MCG/ACT inhaler Inhale 2 puffs into the lungs 2 (two) times daily as needed. For shortness of breath 10/05/13   Noralee Space, MD  Cholecalciferol (VITAMIN D) 1000 UNITS capsule Take 1,000 Units by mouth daily.      Historical Provider, MD  docusate sodium (COLACE) 100 MG capsule Take 1 capsule (100 mg total) by mouth every 12 (twelve) hours. 11/24/13   Ephraim Hamburger, MD  emollient (BIAFINE) cream Apply topically daily. 03/31/14   Olga Millers, MD   mirtazapine (REMERON) 15 MG tablet Take 1 tablet (15 mg total) by mouth at bedtime. 03/31/14   Olga Millers, MD  omeprazole (PRILOSEC) 20 MG capsule Take 20 mg by mouth daily.    Historical Provider, MD  polyethylene glycol powder (GLYCOLAX/MIRALAX) powder Use 1-2 capfuls in juice or water daily 04/06/14   Irene Shipper, MD   BP 157/76  Pulse 81  Temp(Src) 98.1 F (36.7 C) (Oral)  Resp 20  SpO2 100% Physical Exam  Nursing note and vitals reviewed. Constitutional: She is oriented to person, place, and time. She appears well-developed and well-nourished.  HENT:  Head: Normocephalic.  Mouth/Throat: Oropharynx is clear and moist. No oropharyngeal exudate.  Eyes: Conjunctivae and EOM are normal. Pupils are equal, round, and reactive to light.  Neck: Normal range of motion. Neck supple.  Cardiovascular: Normal rate, regular rhythm, normal heart sounds and intact distal pulses.  Exam reveals no gallop and no friction rub.   No murmur heard. Pulmonary/Chest: Effort normal and breath sounds normal. No respiratory distress. She has no wheezes.  Abdominal: Soft. Bowel sounds are normal. There is no tenderness. There is no rebound and no guarding.  Genitourinary:  Several hard stool balls removed from the rectum. Brown stool.  Musculoskeletal: Normal range of motion. She exhibits no edema and no tenderness.  Neurological: She is alert and oriented to person, place, and time.  Skin: Skin is warm and dry.  Psychiatric: She has a normal mood and affect. Her behavior is normal.    ED Course  Fecal disimpaction Date/Time: 04/23/2014 4:33 PM Performed by: Pamella Pert Authorized by: Pamella Pert Consent: Verbal consent obtained. written consent not obtained. Risks and benefits: risks, benefits and alternatives were discussed Consent given by: patient Patient understanding: patient states understanding of the procedure being performed Required items: required blood products,  implants, devices, and special equipment available Patient identity confirmed: verbally with patient, arm band, provided demographic data and hospital-assigned identification number Time out: Immediately prior to procedure a "time out" was called to verify the correct patient, procedure, equipment, support staff and site/side marked as required. Preparation: Patient was prepped and draped in the usual sterile fashion. Local anesthesia used: no Patient sedated: no Patient tolerance: Patient tolerated the procedure well with no immediate complications.   (including critical care time) Labs Review Labs Reviewed - No data to display  Imaging Review Dg Abd Acute W/chest  04/23/2014   CLINICAL DATA:  Four day history of constipation. Rectal pain. Hypertension  EXAM: ACUTE ABDOMEN SERIES (ABDOMEN 2 VIEW & CHEST 1 VIEW)  COMPARISON:  Chest radiograph January 03, 2014; abdominal radiograph November 24, 2013  FINDINGS: PA chest: There is no edema or consolidation. Heart is slightly enlarged with pulmonary vascularity within normal limits. No adenopathy. There is lower thoracic levoscoliosis.  Supine and left lateral decubitus abdomen: There is diffuse stool throughout the colon. There is no bowel dilatation or air-fluid level to suggest obstruction. No free air. There is degenerative  type change in the lumbar spine with lumbar dextroscoliosis.  IMPRESSION: Diffuse stool throughout colon. No obstruction or free air seen. No lung edema or consolidation.   Electronically Signed   By: Lowella Grip M.D.   On: 04/23/2014 15:02     EKG Interpretation None      MDM   Final diagnoses:  Constipation, unspecified constipation type    3:47 PM 80 y.o. female w hx of constipation pw constipation. She states that she takes a daily supplement for constipation and took a rectal suppository this morning. She states that her bowel movements have been fairly normal up until 4 days ago. She had a small hard bowel  movement at that time. She has not had another bowel movement since then. She presents with rectal pain since placing the suppository this morning. She denies any fevers, vomiting, or abdominal pain. She was seen here several months ago with similar symptoms and improved after an enema. I performed a bedside rectal disimpaction which improved the patient's symptoms. Will give an enema as well.  4:33 PM: Pt feeling better after a fleets enema and rectal disimpaction. She no longer has any pain. Plain film shows diffuse stool throughout the colon. Will recommend miralax for several days. I have discussed the diagnosis/risks/treatment options with the patient and family and believe the pt to be eligible for discharge home to follow-up with her pcp. We also discussed returning to the ED immediately if new or worsening sx occur. We discussed the sx which are most concerning (e.g., return of pain, fever) that necessitate immediate return. Medications administered to the patient during their visit and any new prescriptions provided to the patient are listed below.  Medications given during this visit Medications  sodium phosphate (FLEET) 7-19 GM/118ML enema 1 enema (1 enema Rectal Given 04/23/14 1528)  fentaNYL (SUBLIMAZE) injection 50 mcg (50 mcg Intramuscular Given 04/23/14 1528)    New Prescriptions   No medications on file       Pamella Pert, MD 04/23/14 1635

## 2014-04-23 NOTE — ED Notes (Signed)
EMS reports pt lives at home. Reports 4 days of constipation, pt placed suppository  This morning without relief. C/o rectal pain

## 2014-04-23 NOTE — ED Notes (Signed)
Patient transported to X-ray 

## 2014-04-23 NOTE — ED Notes (Signed)
Bed: WA20 Expected date: 04/23/14 Expected time: 2:14 PM Means of arrival: Ambulance Comments: Constipation x 4 days

## 2014-04-23 NOTE — ED Notes (Signed)
Pt stated she did not take blood pressure medication this morning.

## 2014-05-03 ENCOUNTER — Encounter: Payer: Self-pay | Admitting: Podiatry

## 2014-05-03 ENCOUNTER — Ambulatory Visit (INDEPENDENT_AMBULATORY_CARE_PROVIDER_SITE_OTHER): Payer: BC Managed Care – HMO | Admitting: Podiatry

## 2014-05-03 ENCOUNTER — Ambulatory Visit: Payer: BC Managed Care – HMO | Admitting: Podiatry

## 2014-05-03 VITALS — BP 113/74 | HR 67

## 2014-05-03 DIAGNOSIS — B351 Tinea unguium: Secondary | ICD-10-CM

## 2014-05-03 DIAGNOSIS — M79606 Pain in leg, unspecified: Secondary | ICD-10-CM

## 2014-05-03 NOTE — Progress Notes (Signed)
Subjective: 80 y.o. year old female presents complaining of painful toes and pain in between 4th and 5th digit right. Also request toe nails trimmed.    OBJECTIVE:  DERMATOLOGIC EXAMINATION:  Dry thick scaly cracking skin in between 4th and 5th digit right. No drainage, no edema or erythema.  There are multiple plantar calluses bilateral.  VASCULAR EXAMINATION OF LOWER LIMBS:  Pedal pulses: All pedal pulses are faintly palpable..  No edema or erythema. Temperature gradient from tibial crest to dorsum of foot is within normal bilateral.  NEUROLOGIC EXAMINATION OF THE LOWER LIMBS:  All epicritic and tactile sensations grossly intact.  ORTHOPEDIC: Multiple contracted lesser digits bilateral.  Enlarged bunion bilateral.   ASSESSMENT:  Painful plantar calluses bilateral with cracked heel left.  Onychomycosis x 10.  Tinea pedis 4th web space right.   PLAN:  All nails, corns, and callused debrided. Return 3 months.  Anti fungal cream sample dispensed.

## 2014-05-03 NOTE — Patient Instructions (Signed)
Seen for hypertrophic nails. All nails debrided. Return in 3 months or as needed.  

## 2014-05-04 ENCOUNTER — Ambulatory Visit: Payer: BC Managed Care – HMO | Admitting: Podiatry

## 2014-05-09 ENCOUNTER — Telehealth: Payer: Self-pay | Admitting: Internal Medicine

## 2014-05-09 MED ORDER — OMEPRAZOLE 20 MG PO CPDR: 20.0000 mg | DELAYED_RELEASE_CAPSULE | Freq: Every day | ORAL | Status: DC

## 2014-05-09 NOTE — Telephone Encounter (Signed)
Called pt no answer LMOM med [facility].../lmb

## 2014-05-09 NOTE — Telephone Encounter (Signed)
Pt called requesting refill on omeprazole (PRILOSEC) 20 MG capsule [[id number]]    [facility] [address]

## 2014-05-10 ENCOUNTER — Telehealth: Payer: Self-pay | Admitting: Internal Medicine

## 2014-05-10 ENCOUNTER — Telehealth: Payer: Self-pay | Admitting: *Deleted

## 2014-05-10 NOTE — Telephone Encounter (Signed)
A user error has taken place: open by mistake.../lmb  

## 2014-05-11 NOTE — Telephone Encounter (Signed)
PA needs to be processed.  Please call daughter ella back at [phone].

## 2014-06-02 ENCOUNTER — Other Ambulatory Visit: Payer: Self-pay | Admitting: Pulmonary Disease

## 2014-06-24 ENCOUNTER — Encounter (HOSPITAL_COMMUNITY): Payer: Self-pay | Admitting: Emergency Medicine

## 2014-06-24 ENCOUNTER — Observation Stay (HOSPITAL_COMMUNITY)
Admission: EM | Admit: 2014-06-24 | Discharge: 2014-06-27 | Disposition: A | Discharge status: Home / Self Care | Payer: Medicare Other | Attending: Internal Medicine | Admitting: Internal Medicine

## 2014-06-24 ENCOUNTER — Emergency Department (HOSPITAL_COMMUNITY): Payer: Medicare Other

## 2014-06-24 DIAGNOSIS — K5730 Diverticulosis of large intestine without perforation or abscess without bleeding: Secondary | ICD-10-CM | POA: Diagnosis not present

## 2014-06-24 DIAGNOSIS — F419 Anxiety disorder, unspecified: Secondary | ICD-10-CM | POA: Diagnosis not present

## 2014-06-24 DIAGNOSIS — J45901 Unspecified asthma with (acute) exacerbation: Secondary | ICD-10-CM | POA: Diagnosis not present

## 2014-06-24 DIAGNOSIS — Z79899 Other long term (current) drug therapy: Secondary | ICD-10-CM | POA: Diagnosis not present

## 2014-06-24 DIAGNOSIS — R1032 Left lower quadrant pain: Secondary | ICD-10-CM

## 2014-06-24 DIAGNOSIS — M1990 Unspecified osteoarthritis, unspecified site: Secondary | ICD-10-CM | POA: Diagnosis not present

## 2014-06-24 DIAGNOSIS — E559 Vitamin D deficiency, unspecified: Secondary | ICD-10-CM | POA: Diagnosis not present

## 2014-06-24 DIAGNOSIS — K219 Gastro-esophageal reflux disease without esophagitis: Secondary | ICD-10-CM | POA: Diagnosis not present

## 2014-06-24 DIAGNOSIS — Z86010 Personal history of colonic polyps: Secondary | ICD-10-CM | POA: Insufficient documentation

## 2014-06-24 DIAGNOSIS — R55 Syncope and collapse: Principal | ICD-10-CM | POA: Diagnosis present

## 2014-06-24 DIAGNOSIS — J449 Chronic obstructive pulmonary disease, unspecified: Secondary | ICD-10-CM | POA: Diagnosis present

## 2014-06-24 DIAGNOSIS — E780 Pure hypercholesterolemia: Secondary | ICD-10-CM | POA: Insufficient documentation

## 2014-06-24 DIAGNOSIS — K589 Irritable bowel syndrome without diarrhea: Secondary | ICD-10-CM | POA: Diagnosis not present

## 2014-06-24 DIAGNOSIS — R531 Weakness: Secondary | ICD-10-CM | POA: Diagnosis not present

## 2014-06-24 DIAGNOSIS — R109 Unspecified abdominal pain: Secondary | ICD-10-CM | POA: Diagnosis present

## 2014-06-24 DIAGNOSIS — E43 Unspecified severe protein-calorie malnutrition: Secondary | ICD-10-CM | POA: Insufficient documentation

## 2014-06-24 DIAGNOSIS — Z7982 Long term (current) use of aspirin: Secondary | ICD-10-CM | POA: Insufficient documentation

## 2014-06-24 DIAGNOSIS — Z87440 Personal history of urinary (tract) infections: Secondary | ICD-10-CM | POA: Insufficient documentation

## 2014-06-24 DIAGNOSIS — K5900 Constipation, unspecified: Secondary | ICD-10-CM | POA: Diagnosis present

## 2014-06-24 DIAGNOSIS — I639 Cerebral infarction, unspecified: Secondary | ICD-10-CM | POA: Insufficient documentation

## 2014-06-24 DIAGNOSIS — I10 Essential (primary) hypertension: Secondary | ICD-10-CM | POA: Diagnosis not present

## 2014-06-24 DIAGNOSIS — R079 Chest pain, unspecified: Secondary | ICD-10-CM | POA: Diagnosis present

## 2014-06-24 LAB — URINALYSIS, ROUTINE W REFLEX MICROSCOPIC
Bilirubin Urine: NEGATIVE
GLUCOSE, UA: NEGATIVE mg/dL
KETONES UR: NEGATIVE mg/dL
Leukocytes, UA: NEGATIVE
NITRITE: NEGATIVE
PH: 7 (ref 5.0–8.0)
Protein, ur: NEGATIVE mg/dL
SPECIFIC GRAVITY, URINE: 1.009 (ref 1.005–1.030)
Urobilinogen, UA: 0.2 mg/dL (ref 0.0–1.0)

## 2014-06-24 LAB — BASIC METABOLIC PANEL
Anion gap: 12 (ref 5–15)
BUN: 12 mg/dL (ref 6–23)
CO2: 25 mmol/L (ref 19–32)
CREATININE: 0.67 mg/dL (ref 0.50–1.10)
Calcium: 10 mg/dL (ref 8.4–10.5)
Chloride: 103 mEq/L (ref 96–112)
GFR, EST AFRICAN AMERICAN: 86 mL/min — AB (ref >90)
GFR, EST NON AFRICAN AMERICAN: 74 mL/min — AB (ref >90)
GLUCOSE: 94 mg/dL (ref 70–99)
POTASSIUM: 4.1 mmol/L (ref 3.5–5.1)
Sodium: 140 mmol/L (ref 135–145)

## 2014-06-24 LAB — I-STAT TROPONIN, ED: Troponin i, poc: 0.01 ng/mL (ref 0.00–0.08)

## 2014-06-24 LAB — CBC
HCT: 38 % (ref 36.0–46.0)
Hemoglobin: 12.4 g/dL (ref 12.0–15.0)
MCH: 30.9 pg (ref 26.0–34.0)
MCHC: 32.6 g/dL (ref 30.0–36.0)
MCV: 94.8 fL (ref 78.0–100.0)
Platelets: 215 10*3/uL (ref 150–400)
RBC: 4.01 MIL/uL (ref 3.87–5.11)
RDW: 13.1 % (ref 11.5–15.5)
WBC: 4.9 10*3/uL (ref 4.0–10.5)

## 2014-06-24 LAB — URINE MICROSCOPIC-ADD ON

## 2014-06-24 LAB — CBG MONITORING, ED: Glucose-Capillary: 81 mg/dL (ref 70–99)

## 2014-06-24 NOTE — ED Notes (Signed)
Patient transported to X-ray 

## 2014-06-24 NOTE — ED Notes (Signed)
Rhina Brackett MD at bedside.

## 2014-06-24 NOTE — ED Provider Notes (Signed)
CSN: [id number]     Arrival date & time 06/24/14  2131 History   First MD Initiated Contact with Patient 06/24/14 2216     Chief Complaint  Patient presents with  . Near Syncope     (Consider location/radiation/quality/duration/timing/severity/associated sxs/prior Treatment) Patient is a 81 y.o. female presenting with near-syncope. The history is provided by the patient.  Near Syncope This is a new problem. The current episode started today. The problem has been resolved. Associated symptoms include weakness. Pertinent negatives include no abdominal pain, arthralgias, chest pain, chills, coughing, diaphoresis, fatigue, fever, headaches, myalgias, nausea, rash, sore throat or vomiting. She has tried nothing for the symptoms.    Past Medical History  Diagnosis Date  . Allergic rhinitis   . Acute asthmatic bronchitis   . Hypertension   . Hypercholesteremia   . GERD (gastroesophageal reflux disease)   . Diverticulosis of colon   . IBS (irritable bowel syndrome)   . History of colonic polyps   . UTI (lower urinary tract infection)   . DJD (degenerative joint disease)   . Lumbar back pain   . Lacunar infarction   . Vitamin D deficiency   . Anxiety    Past Surgical History  Procedure Laterality Date  . Abdominal hysterectomy     No family history on file. History  Substance Use Topics  . Smoking status: Never Smoker   . Smokeless tobacco: Never Used  . Alcohol Use: No   OB History    No data available     Review of Systems  Constitutional: Negative for fever, chills, diaphoresis, activity change, appetite change and fatigue.  HENT: Negative for facial swelling, rhinorrhea, sore throat, trouble swallowing and voice change.   Eyes: Negative for photophobia, pain and visual disturbance.  Respiratory: Negative for cough, shortness of breath, wheezing and stridor.   Cardiovascular: Positive for near-syncope. Negative for chest pain, palpitations and leg swelling.   Gastrointestinal: Negative for nausea, vomiting, abdominal pain, constipation and anal bleeding.  Endocrine: Negative.   Genitourinary: Negative for dysuria, vaginal bleeding, vaginal discharge and vaginal pain.  Musculoskeletal: Negative for myalgias, back pain and arthralgias.  Skin: Negative.  Negative for rash.  Allergic/Immunologic: Negative.   Neurological: Positive for weakness and light-headedness. Negative for tremors, syncope and headaches.  Psychiatric/Behavioral: Negative for suicidal ideas, sleep disturbance and self-injury.  All other systems reviewed and are negative.     Allergies  Demerol; Influenza vaccines; and Telithromycin  Home Medications   Prior to Admission medications   Medication Sig Start Date End Date Taking? Authorizing Provider  acetaminophen (TYLENOL) 325 MG tablet Take 2 tablets (650 mg total) by mouth every 6 (six) hours as needed. 12/14/12  Yes Christina P Rama, MD  albuterol (PROVENTIL HFA) 108 (90 BASE) MCG/ACT inhaler Inhale 2 puffs into the lungs every 6 (six) hours as needed. For shortness of breath 10/05/13  Yes Noralee Space, MD  aspirin EC 81 MG tablet Take 81 mg by mouth daily.   Yes Historical Provider, MD  atorvastatin (LIPITOR) 80 MG tablet TAKE 1 TABLET BY MOUTH DAILY 06/02/14  Yes Noralee Space, MD  budesonide-formoterol [facility]) 80-4.5 MCG/ACT inhaler Inhale 2 puffs into the lungs 2 (two) times daily as needed. For shortness of breath 10/05/13  Yes Noralee Space, MD  Calcium Carbonate-Vitamin D (CALTRATE 600+D) 600-400 MG-UNIT per tablet Take 1 tablet by mouth daily. For bone health   Yes Historical Provider, MD  Cholecalciferol (VITAMIN D) 1000 UNITS capsule Take 1,000 Units by  mouth daily.     Yes Historical Provider, MD  docusate sodium (COLACE) 100 MG capsule Take 1 capsule (100 mg total) by mouth every 12 (twelve) hours. 11/24/13  Yes Ephraim Hamburger, MD  emollient (BIAFINE) cream Apply topically daily. 03/31/14  Yes Olga Millers, MD  fish oil-omega-3 fatty acids 1000 MG capsule Take 1 g by mouth 2 (two) times daily.    Yes Historical Provider, MD  losartan (COZAAR) 100 MG tablet Take 100 mg by mouth daily.   Yes Historical Provider, MD  mirtazapine (REMERON) 15 MG tablet Take 1 tablet (15 mg total) by mouth at bedtime. 03/31/14  Yes Olga Millers, MD  omeprazole (PRILOSEC) 20 MG capsule Take 1 capsule (20 mg total) by mouth daily. 05/09/14  Yes Olga Millers, MD  psyllium (METAMUCIL) 58.6 % packet Take 1 packet by mouth daily.   Yes Historical Provider, MD  white petrolatum (VASELINE) GEL Apply 1 application topically daily as needed for dry skin (arms, legs, feet.).   Yes Historical Provider, MD  atorvastatin (LIPITOR) 40 MG tablet Take 1 tablet (40 mg total) by mouth daily. Patient not taking: Reported on 06/24/2014 01/04/13   Noralee Space, MD  losartan (COZAAR) 50 MG tablet Take 50 mg by mouth daily. 04/29/14   Historical Provider, MD   BP 128/104 mmHg  Pulse 77  Temp(Src) 98.4 F (36.9 C) (Oral)  Resp 25  Ht 5\' 3"  (1.6 m)  Wt 115 lb (52.164 kg)  BMI 20.38 kg/m2  SpO2 100% Physical Exam  Constitutional: She is oriented to person, place, and time. She appears well-developed and well-nourished. No distress.  HENT:  Head: Normocephalic and atraumatic.  Right Ear: External ear normal.  Left Ear: External ear normal.  Mouth/Throat: Oropharynx is clear and moist. No oropharyngeal exudate.  Eyes: Conjunctivae and EOM are normal. Pupils are equal, round, and reactive to light. No scleral icterus.  Neck: Normal range of motion. Neck supple. No JVD present. No tracheal deviation present. No thyromegaly present.  Cardiovascular: Normal rate, regular rhythm and intact distal pulses.  Exam reveals no gallop and no friction rub.   No murmur heard. Pulmonary/Chest: Effort normal and breath sounds normal. No respiratory distress. She has no wheezes. She has no rales.  Abdominal: Soft. Bowel sounds are normal.  She exhibits no distension. There is no tenderness.  Musculoskeletal: Normal range of motion. She exhibits no edema or tenderness.  Neurological: She is alert and oriented to person, place, and time. No cranial nerve deficit. She exhibits normal muscle tone. Coordination normal.  Patient becomes lightheaded upon standing and has to sit down, unable to walk without assistance.   Skin: Skin is warm and dry. She is not diaphoretic. No pallor.  Psychiatric: She has a normal mood and affect. She expresses no homicidal and no suicidal ideation. She expresses no suicidal plans and no homicidal plans.  Nursing note and vitals reviewed.   ED Course  Procedures (including critical care time) Labs Review Labs Reviewed  BASIC METABOLIC PANEL - Abnormal; Notable for the following:    GFR calc non Af Amer 74 (*)    GFR calc Af Amer 86 (*)    All other components within normal limits  URINALYSIS, ROUTINE W REFLEX MICROSCOPIC - Abnormal; Notable for the following:    Hgb urine dipstick TRACE (*)    All other components within normal limits  URINE MICROSCOPIC-ADD ON - Abnormal; Notable for the following:    Casts HYALINE CASTS (*)  All other components within normal limits  CBC  I-STAT TROPOININ, ED  CBG MONITORING, ED    Imaging Review Dg Chest 2 View  06/24/2014   CLINICAL DATA:  Right-sided neck pain. Feels like upper back is on fire in going down to the abdomen. Began with cold and flu Medicine. Patient is unstable and dizzy. Near syncope.  EXAM: CHEST  2 VIEW  COMPARISON:  04/23/2014  FINDINGS: Pulmonary hyperinflation. Normal heart size and pulmonary vascularity. No focal airspace disease or consolidation in the lungs. No blunting of costophrenic angles. No pneumothorax. Mediastinal contours appear intact. Degenerative changes in the shoulders and spine. Mild thoracolumbar scoliosis convex towards the left. Calcified and tortuous aorta.  IMPRESSION: Pulmonary hyperinflation.  No evidence of  active pulmonary disease.   Electronically Signed   By: Lucienne Capers M.D.   On: 06/24/2014 23:22     EKG Interpretation   Date/Time:  Friday June 24 2014 21:39:29 EST Ventricular Rate:  75 PR Interval:  186 QRS Duration: 84 QT Interval:  402 QTC Calculation: 449 R Axis:   -26 Text Interpretation:  Sinus rhythm Borderline left axis deviation Probable  anteroseptal infarct, recent Baseline wander in lead(s) II III aVF  Confirmed by Alvino Chapel  MD, Ovid Curd [phone]) on 06/24/2014 10:40:15 PM      MDM   Final diagnoses:  Near syncope    The patient is a 81 y.o. F who presents with near syncope at home. Patient reports feeling better in the ED and labs are wnl but upon standing up to be walked patient becomes very lightheaded again and is unable to walk on own power (patient does not use a cane or walker at home). Patient put in observation in hospitalist service for further management. Patient informed of this plan and expresses understanding and agreement.  Patient seen with attending, Dr. Alvino Chapel, who oversaw clinical decision making.   Margaretann Loveless, MD 06/25/14 0041  Jasper Riling. Alvino Chapel, MD 06/27/14 [phone]

## 2014-06-24 NOTE — ED Notes (Signed)
Took MG and mucinex and began feeling like she was going pass out in approx 15-20 min.  Called family who called 11.  Reports pain free except for pain that comes and goes on top of head.

## 2014-06-25 ENCOUNTER — Observation Stay (HOSPITAL_COMMUNITY): Payer: Medicare Other

## 2014-06-25 DIAGNOSIS — R1032 Left lower quadrant pain: Secondary | ICD-10-CM

## 2014-06-25 DIAGNOSIS — I10 Essential (primary) hypertension: Secondary | ICD-10-CM

## 2014-06-25 DIAGNOSIS — J42 Unspecified chronic bronchitis: Secondary | ICD-10-CM

## 2014-06-25 DIAGNOSIS — R079 Chest pain, unspecified: Secondary | ICD-10-CM | POA: Diagnosis present

## 2014-06-25 DIAGNOSIS — R55 Syncope and collapse: Secondary | ICD-10-CM | POA: Diagnosis present

## 2014-06-25 DIAGNOSIS — K5900 Constipation, unspecified: Secondary | ICD-10-CM

## 2014-06-25 DIAGNOSIS — K219 Gastro-esophageal reflux disease without esophagitis: Secondary | ICD-10-CM

## 2014-06-25 DIAGNOSIS — R109 Unspecified abdominal pain: Secondary | ICD-10-CM | POA: Diagnosis present

## 2014-06-25 LAB — COMPREHENSIVE METABOLIC PANEL
ALT: 13 U/L (ref 0–35)
ANION GAP: 5 (ref 5–15)
AST: 17 U/L (ref 0–37)
Albumin: 3 g/dL — ABNORMAL LOW (ref 3.5–5.2)
Alkaline Phosphatase: 61 U/L (ref 39–117)
BUN: 12 mg/dL (ref 6–23)
CALCIUM: 9.1 mg/dL (ref 8.4–10.5)
CO2: 29 mmol/L (ref 19–32)
Chloride: 107 mEq/L (ref 96–112)
Creatinine, Ser: 0.68 mg/dL (ref 0.50–1.10)
GFR calc Af Amer: 85 mL/min — ABNORMAL LOW (ref >90)
GFR, EST NON AFRICAN AMERICAN: 74 mL/min — AB (ref >90)
Glucose, Bld: 84 mg/dL (ref 70–99)
Potassium: 3.9 mmol/L (ref 3.5–5.1)
Sodium: 141 mmol/L (ref 135–145)
Total Bilirubin: 0.8 mg/dL (ref 0.3–1.2)
Total Protein: 5.9 g/dL — ABNORMAL LOW (ref 6.0–8.3)

## 2014-06-25 LAB — TROPONIN I
TROPONIN I: 0.04 ng/mL — AB (ref <0.031)
Troponin I: 0.03 ng/mL (ref <0.031)
Troponin I: <0.03 ng/mL (ref <0.031)

## 2014-06-25 LAB — CBC WITH DIFFERENTIAL/PLATELET
BASOS ABS: 0.1 10*3/uL (ref 0.0–0.1)
Basophils Relative: 2 % — ABNORMAL HIGH (ref 0–1)
Eosinophils Absolute: 0.1 10*3/uL (ref 0.0–0.7)
Eosinophils Relative: 3 % (ref 0–5)
HCT: 35.2 % — ABNORMAL LOW (ref 36.0–46.0)
Hemoglobin: 11.5 g/dL — ABNORMAL LOW (ref 12.0–15.0)
LYMPHS PCT: 40 % (ref 12–46)
Lymphs Abs: 1.7 10*3/uL (ref 0.7–4.0)
MCH: 31 pg (ref 26.0–34.0)
MCHC: 32.7 g/dL (ref 30.0–36.0)
MCV: 94.9 fL (ref 78.0–100.0)
MONO ABS: 0.4 10*3/uL (ref 0.1–1.0)
Monocytes Relative: 9 % (ref 3–12)
Neutro Abs: 1.9 10*3/uL (ref 1.7–7.7)
Neutrophils Relative %: 46 % (ref 43–77)
Platelets: 223 10*3/uL (ref 150–400)
RBC: 3.71 MIL/uL — ABNORMAL LOW (ref 3.87–5.11)
RDW: 13.3 % (ref 11.5–15.5)
WBC: 4.1 10*3/uL (ref 4.0–10.5)

## 2014-06-25 LAB — PROTIME-INR
INR: 1.02 (ref 0.00–1.49)
PROTHROMBIN TIME: 13.5 s (ref 11.6–15.2)

## 2014-06-25 MED ORDER — ONDANSETRON HCL 4 MG/2ML IJ SOLN: 4.0000 mg | Freq: Four times a day (QID) | INTRAMUSCULAR | Status: DC | PRN

## 2014-06-25 MED ORDER — ASPIRIN EC 325 MG PO TBEC
325.0000 mg | DELAYED_RELEASE_TABLET | Freq: Every day | ORAL | Status: DC
  Administered 2014-06-25 – 2014-06-27 (×3): 325 mg via ORAL
  Filled 2014-06-25 (×3): qty 1

## 2014-06-25 MED ORDER — ACETAMINOPHEN 325 MG PO TABS
650.0000 mg | ORAL_TABLET | Freq: Once | ORAL | Status: AC
  Administered 2014-06-25: 650 mg via ORAL
  Filled 2014-06-25: qty 2

## 2014-06-25 MED ORDER — BOOST / RESOURCE BREEZE PO LIQD
1.0000 | ORAL | Status: DC
  Administered 2014-06-26 – 2014-06-27 (×3): 1 via ORAL

## 2014-06-25 MED ORDER — HEPARIN SODIUM (PORCINE) 5000 UNIT/ML IJ SOLN
5000.0000 [IU] | Freq: Three times a day (TID) | INTRAMUSCULAR | Status: DC
  Administered 2014-06-25 – 2014-06-27 (×7): 5000 [IU] via SUBCUTANEOUS
  Filled 2014-06-25 (×7): qty 1

## 2014-06-25 MED ORDER — DOCUSATE SODIUM 100 MG PO CAPS
100.0000 mg | ORAL_CAPSULE | Freq: Two times a day (BID) | ORAL | Status: DC
  Administered 2014-06-25 – 2014-06-27 (×4): 100 mg via ORAL
  Filled 2014-06-25 (×4): qty 1

## 2014-06-25 MED ORDER — MIRTAZAPINE 7.5 MG PO TABS
15.0000 mg | ORAL_TABLET | Freq: Every day | ORAL | Status: DC
  Administered 2014-06-25 – 2014-06-26 (×2): 15 mg via ORAL
  Filled 2014-06-25 (×2): qty 2

## 2014-06-25 MED ORDER — BUDESONIDE-FORMOTEROL FUMARATE 80-4.5 MCG/ACT IN AERO
2.0000 | INHALATION_SPRAY | Freq: Two times a day (BID) | RESPIRATORY_TRACT | Status: DC
  Administered 2014-06-25 – 2014-06-26 (×4): 2 via RESPIRATORY_TRACT
  Filled 2014-06-25: qty 6.9

## 2014-06-25 MED ORDER — ACETAMINOPHEN 650 MG RE SUPP: 650.0000 mg | Freq: Four times a day (QID) | RECTAL | Status: DC | PRN

## 2014-06-25 MED ORDER — ATORVASTATIN CALCIUM 80 MG PO TABS
80.0000 mg | ORAL_TABLET | Freq: Every day | ORAL | Status: DC
  Administered 2014-06-25 – 2014-06-27 (×3): 80 mg via ORAL
  Filled 2014-06-25 (×3): qty 1

## 2014-06-25 MED ORDER — SODIUM CHLORIDE 0.9 % IJ SOLN
3.0000 mL | Freq: Two times a day (BID) | INTRAMUSCULAR | Status: DC
  Administered 2014-06-25 – 2014-06-27 (×4): 3 mL via INTRAVENOUS

## 2014-06-25 MED ORDER — ASPIRIN EC 81 MG PO TBEC: 81.0000 mg | DELAYED_RELEASE_TABLET | Freq: Every day | ORAL | Status: DC

## 2014-06-25 MED ORDER — ACETAMINOPHEN 325 MG PO TABS
650.0000 mg | ORAL_TABLET | Freq: Four times a day (QID) | ORAL | Status: DC | PRN
  Administered 2014-06-25: 650 mg via ORAL
  Filled 2014-06-25: qty 2

## 2014-06-25 MED ORDER — ONDANSETRON HCL 4 MG PO TABS: 4.0000 mg | ORAL_TABLET | Freq: Four times a day (QID) | ORAL | Status: DC | PRN

## 2014-06-25 MED ORDER — SODIUM CHLORIDE 0.9 % IV SOLN
INTRAVENOUS | Status: DC
  Administered 2014-06-25: 13:00:00 via INTRAVENOUS

## 2014-06-25 MED ORDER — ALBUTEROL SULFATE (2.5 MG/3ML) 0.083% IN NEBU: 3.0000 mL | INHALATION_SOLUTION | Freq: Four times a day (QID) | RESPIRATORY_TRACT | Status: DC | PRN

## 2014-06-25 MED ORDER — BOOST PLUS PO LIQD
237.0000 mL | ORAL | Status: DC
  Administered 2014-06-26: 237 mL via ORAL
  Filled 2014-06-25 (×3): qty 237

## 2014-06-25 NOTE — Progress Notes (Signed)
UR completed 

## 2014-06-25 NOTE — Progress Notes (Signed)
Patient admitted after midnight. Please see H&p.  Long conversation at bedside with son.  Patient has been losing weight for a while, nutrition consult.  Pt consult.  +orthos- IVF Echo pending  Eulogio Bear

## 2014-06-25 NOTE — Progress Notes (Signed)
INITIAL NUTRITION ASSESSMENT  DOCUMENTATION CODES Per approved criteria  -Severe malnutrition in the context of chronic illness  Pt meets criteria for SEVERE MALNUTRITION in the context of CHRONIC ILLNESS as evidenced by severe fat wasting, severe muscle wasting, and 10% weight loss in less than 6 months.  INTERVENTION: Provide Snacks TID Provide Boost Plus once daily Provide Resource Breeze once daily Encourage PO intake  NUTRITION DIAGNOSIS: Unintentional weight loss related to aging as evidenced by 19% weight loss in less than 9 months and 10% weight loss in less than 4 months.   Goal: Pt to meet >/= 90% of their estimated nutrition needs    Monitor:  PO intake, weight trend, labs, supplement acceptance  Reason for Assessment: Consult due to weight loss/ MST  81 y.o. female  Admitting Dx: Near syncope  ASSESSMENT: 81 y.o. female with Past medical history of hypertension, GERD, irritable bowel syndrome with chronic constipation requiring recurrent stool disimpaction, chronic pain. The patient is presenting with complaints of dizziness.  Pt reports that she used to weigh 135 lbs less than one year ago and has been losing weight for unknown reasons. She states she has a good appetite and eats 3 meals daily, plus snacks, and she drinks Ensure nutritional supplements on occasion. Pt reports that family helps her bus groceries and prepare meals. Per nursing notes, pt ate 75-100% of meals today. Pt requesting snacks and would like to try Boost Plus and Resource Breeze Nutritional supplements.   Labs: low hemoglobin, low albumin  Nutrition Focused Physical Exam:  Subcutaneous Fat:  Orbital Region: mild wasting Upper Arm Region: moderate wasting Thoracic and Lumbar Region: moderate to severe wasting  Muscle:  Temple Region: severe wasting Clavicle Bone Region: severe wasting Clavicle and Acromion Bone Region: severe wasting Scapular Bone Region: NA Dorsal Hand: moderate  wasing Patellar Region: moderate wasting Anterior Thigh Region: moderate wasting Posterior Calf Region: moderate wasting  Edema: none noted   Height: Ht Readings from Last 1 Encounters:  06/25/14 5\' 3"  (1.6 m)    Weight: Wt Readings from Last 1 Encounters:  06/25/14 110 lb 11.2 oz (50.213 kg)    Ideal Body Weight: 115 lbs  % Ideal Body Weight: 96%  Wt Readings from Last 10 Encounters:  06/25/14 110 lb 11.2 oz (50.213 kg)  04/06/14 114 lb 12.8 oz (52.073 kg)  03/31/14 114 lb 1.9 oz (51.764 kg)  03/04/14 123 lb (55.792 kg)  02/01/14 123 lb (55.792 kg)  12/16/13 126 lb (57.153 kg)  11/11/13 131 lb (59.421 kg)  10/05/13 136 lb 6.4 oz (61.871 kg)  06/03/13 139 lb 11.2 oz (63.368 kg)  04/05/13 140 lb 9.6 oz (63.776 kg)    Usual Body Weight: 135-140 lbs  % Usual Body Weight: 81%  BMI:  Body mass index is 19.61 kg/(m^2).  Estimated Nutritional Needs: Kcal: 2426-8341 Protein: 60-75 grams Fluid: 1.5-1.7 L/day  Skin: WDL  Diet Order: Diet Heart  EDUCATION NEEDS: -No education needs identified at this time   Intake/Output Summary (Last 24 hours) at 06/25/14 1903 Last data filed at 06/25/14 1801  Gross per 24 hour  Intake    480 ml  Output      0 ml  Net    480 ml    Last BM: 1/1   Labs:   Recent Labs Lab 06/24/14 2149 06/25/14 0355  NA 140 141  K 4.1 3.9  CL 103 107  CO2 25 29  BUN 12 12  CREATININE 0.67 0.68  CALCIUM 10.0 9.1  GLUCOSE 94 84    CBG (last 3)   Recent Labs  06/24/14 2225  GLUCAP 81    Scheduled Meds: . aspirin EC  325 mg Oral Daily  . atorvastatin  80 mg Oral Daily  . budesonide-formoterol  2 puff Inhalation BID  . heparin  5,000 Units Subcutaneous 3 times per day  . mirtazapine  15 mg Oral QHS  . sodium chloride  3 mL Intravenous Q12H    Continuous Infusions: . sodium chloride 50 mL/hr at 06/25/14 1238    Past Medical History  Diagnosis Date  . Allergic rhinitis   . Acute asthmatic bronchitis   .  Hypertension   . Hypercholesteremia   . GERD (gastroesophageal reflux disease)   . Diverticulosis of colon   . IBS (irritable bowel syndrome)   . History of colonic polyps   . UTI (lower urinary tract infection)   . DJD (degenerative joint disease)   . Lumbar back pain   . Lacunar infarction   . Vitamin D deficiency   . Anxiety     Past Surgical History  Procedure Laterality Date  . Abdominal hysterectomy      Pryor Ochoa RD, LDN Inpatient Clinical Dietitian Pager: [phone] After Hours Pager: [phone]

## 2014-06-25 NOTE — H&P (Signed)
[facility] History and Physical  Patient: Kathryn Whitaker  NGE:[id number]  DOB: 1920/05/12  DOS: the patient was seen and examined on 06/25/2014 PCP: Olga Millers, MD  Chief Complaint: Dizziness  HPI: Kathryn Whitaker is a 81 y.o. female with Past medical history of hypertension, GERD, irritable bowel syndrome with chronic constipation requiring recurrent stool disimpaction, chronic pain. The patient is presenting with complaints of dizziness. He mentions she has cough for last couple of weeks and just started taking the Mucinex. She was eating okay and was living with her daughter for last 1 week. Did not have any diarrhea or nausea or vomiting. But today after taking the Mucinex and magnesium she felt that she was going to pass out and she had some dizziness that lasted for 15-20 minutes. She did not have any chest pain initially but later on she also had some chest pain which lasted for a few seconds. The pain was located in the center of the chest. She has chronic abdominal pain which occurs on and off and currently she has some pain on her left side of the abdomen. Last bowel movement was yesterday. Patient generally lives alone. And does her activity on her own.  The patient is coming from home. And at her baseline independent for most of her ADL.  Review of Systems: as mentioned in the history of present illness.  A Comprehensive review of the other systems is negative.  Past Medical History  Diagnosis Date  . Allergic rhinitis   . Acute asthmatic bronchitis   . Hypertension   . Hypercholesteremia   . GERD (gastroesophageal reflux disease)   . Diverticulosis of colon   . IBS (irritable bowel syndrome)   . History of colonic polyps   . UTI (lower urinary tract infection)   . DJD (degenerative joint disease)   . Lumbar back pain   . Lacunar infarction   . Vitamin D deficiency   . Anxiety    Past Surgical History  Procedure Laterality Date  . Abdominal  hysterectomy     Social History:  reports that she has never smoked. She has never used smokeless tobacco. She reports that she does not drink alcohol or use illicit drugs.  Allergies  Allergen Reactions  . Demerol [Meperidine]     Pt states she can't remember type of reaction  . Influenza Vaccines     Pt can't remember reaction but states allergic and has not had in 25 years  . Telithromycin Hives    REACTION: pt states HIVES....    No family history on file.  Prior to Admission medications   Medication Sig Start Date End Date Taking? Authorizing Provider  acetaminophen (TYLENOL) 325 MG tablet Take 2 tablets (650 mg total) by mouth every 6 (six) hours as needed. 12/14/12  Yes Christina P Rama, MD  albuterol (PROVENTIL HFA) 108 (90 BASE) MCG/ACT inhaler Inhale 2 puffs into the lungs every 6 (six) hours as needed. For shortness of breath 10/05/13  Yes Noralee Space, MD  aspirin EC 81 MG tablet Take 81 mg by mouth daily.   Yes Historical Provider, MD  atorvastatin (LIPITOR) 80 MG tablet TAKE 1 TABLET BY MOUTH DAILY 06/02/14  Yes Noralee Space, MD  budesonide-formoterol [facility]) 80-4.5 MCG/ACT inhaler Inhale 2 puffs into the lungs 2 (two) times daily as needed. For shortness of breath 10/05/13  Yes Noralee Space, MD  Calcium Carbonate-Vitamin D (CALTRATE 600+D) 600-400 MG-UNIT per tablet Take 1 tablet by mouth  daily. For bone health   Yes Historical Provider, MD  Cholecalciferol (VITAMIN D) 1000 UNITS capsule Take 1,000 Units by mouth daily.     Yes Historical Provider, MD  docusate sodium (COLACE) 100 MG capsule Take 1 capsule (100 mg total) by mouth every 12 (twelve) hours. 11/24/13  Yes Ephraim Hamburger, MD  emollient (BIAFINE) cream Apply topically daily. 03/31/14  Yes Olga Millers, MD  fish oil-omega-3 fatty acids 1000 MG capsule Take 1 g by mouth 2 (two) times daily.    Yes Historical Provider, MD  losartan (COZAAR) 100 MG tablet Take 100 mg by mouth daily.   Yes Historical  Provider, MD  mirtazapine (REMERON) 15 MG tablet Take 1 tablet (15 mg total) by mouth at bedtime. 03/31/14  Yes Olga Millers, MD  omeprazole (PRILOSEC) 20 MG capsule Take 1 capsule (20 mg total) by mouth daily. 05/09/14  Yes Olga Millers, MD  psyllium (METAMUCIL) 58.6 % packet Take 1 packet by mouth daily.   Yes Historical Provider, MD  white petrolatum (VASELINE) GEL Apply 1 application topically daily as needed for dry skin (arms, legs, feet.).   Yes Historical Provider, MD  atorvastatin (LIPITOR) 40 MG tablet Take 1 tablet (40 mg total) by mouth daily. Patient not taking: Reported on 06/24/2014 01/04/13   Noralee Space, MD  losartan (COZAAR) 50 MG tablet Take 50 mg by mouth daily. 04/29/14   Historical Provider, MD    Physical Exam: Filed Vitals:   06/24/14 2230 06/25/14 0055 06/25/14 0058 06/25/14 0130  BP: 128/104 155/69 155/69 106/84  Pulse: 77 73  72  Temp:  98 F (36.7 C) 98.7 F (37.1 C) 98.2 F (36.8 C)  TempSrc:   Oral Oral  Resp: 25 15 15 16   Height:    5\' 3"  (1.6 m)  Weight:    50.213 kg (110 lb 11.2 oz)  SpO2: 100% 100% 100% 100%    General: Alert, Awake and Oriented to Time, Place and Person. Appear in mild distress Eyes: PERRL ENT: Oral Mucosa clear moist. Neck: no JVD Cardiovascular: S1 and S2 Present, no Murmur, Peripheral Pulses Present Respiratory: Bilateral Air entry equal and Decreased, Clear to Auscultation, noCrackles, no wheezes Abdomen: Bowel Sound present, Soft and left-sided tender Skin: no Rash Extremities: no Pedal edema, no calf tenderness Neurologic: Grossly no focal neuro deficit.  Labs on Admission:  CBC:  Recent Labs Lab 06/24/14 2149  WBC 4.9  HGB 12.4  HCT 38.0  MCV 94.8  PLT 215    CMP     Component Value Date/Time   NA 140 06/24/2014 2149   K 4.1 06/24/2014 2149   CL 103 06/24/2014 2149   CO2 25 06/24/2014 2149   GLUCOSE 94 06/24/2014 2149   BUN 12 06/24/2014 2149   CREATININE 0.67 06/24/2014 2149   CALCIUM 10.0  06/24/2014 2149   PROT 6.9 10/05/2013 1100   ALBUMIN 3.6 10/05/2013 1100   AST 17 10/05/2013 1100   ALT 14 10/05/2013 1100   ALKPHOS 70 10/05/2013 1100   BILITOT 0.9 10/05/2013 1100   GFRNONAA 74* 06/24/2014 2149   GFRAA 86* 06/24/2014 2149    No results for input(s): LIPASE, AMYLASE in the last 168 hours. No results for input(s): AMMONIA in the last 168 hours.  No results for input(s): CKTOTAL, CKMB, CKMBINDEX, TROPONINI in the last 168 hours. BNP (last 3 results)  Recent Labs  01/03/14 1923  PROBNP 186.2    Radiological Exams on Admission: Dg Chest 2 View  06/24/2014   CLINICAL DATA:  Right-sided neck pain. Feels like upper back is on fire in going down to the abdomen. Began with cold and flu Medicine. Patient is unstable and dizzy. Near syncope.  EXAM: CHEST  2 VIEW  COMPARISON:  04/23/2014  FINDINGS: Pulmonary hyperinflation. Normal heart size and pulmonary vascularity. No focal airspace disease or consolidation in the lungs. No blunting of costophrenic angles. No pneumothorax. Mediastinal contours appear intact. Degenerative changes in the shoulders and spine. Mild thoracolumbar scoliosis convex towards the left. Calcified and tortuous aorta.  IMPRESSION: Pulmonary hyperinflation.  No evidence of active pulmonary disease.   Electronically Signed   By: Lucienne Capers M.D.   On: 06/24/2014 23:22    EKG: Independently reviewed. normal sinus rhythm, nonspecific ST and T waves changes.  Assessment/Plan Principal Problem:   Near syncope Active Problems:   GERD   HTN (hypertension)   COPD (chronic obstructive pulmonary disease)   Constipation   Chest pain   Abdominal pain   1. Near syncope Patient is presenting with complaints of near syncope event. She also wonders of chest pain and abdominal pain. Although at the time of my evaluation she is chest pain-free. We'll continue to monitor her on telemetry. After an echocardiogram as well as serial troponin. Increase her  aspirin from 81 mg a 25 mg due to mildly elevated troponin. May require consultation with cardiology.  2. Abdominal pain. Constipation. Interval bowel syndrome. Patient is on multiple medications for constipation. At present without an x-ray of her abdomen and will provide enema as indicated.  3. Hypertension. Orthostatic drop. At present continuing with Cozaar. Continue close monitoring.  Advance goals of care discussion: Full code as per my discussion with patient   DVT Prophylaxis: subcutaneous Heparin Nutrition: Nothing by mouth except medications  Disposition: Admitted to observation in telemetry unit.  Author: Berle Mull, MD [facility] Pager: [phone] 06/25/2014, 4:03 AM    If 7PM-7AM, please contact night-coverage [email or web address] Password [email or web address]

## 2014-06-25 NOTE — ED Notes (Addendum)
Doristine Devoid grandson, Westly Pam can be reached at [phone] for any issues.

## 2014-06-26 DIAGNOSIS — E43 Unspecified severe protein-calorie malnutrition: Secondary | ICD-10-CM | POA: Insufficient documentation

## 2014-06-26 NOTE — Progress Notes (Signed)
Physical Therapy Evaluation Patient Details Name: Kathryn Whitaker MRN: [id number] DOB: Dec 04, 1919 Today's Date: 06/26/2014   History of Present Illness  Patient is a 81 yo female admitted 06/24/14 with near syncopal episode and dizziness.  Patient found to be orthostatic.  PMH:  HTN, IBS, chronic pain, anxiety.  Clinical Impression  Patient presents with problems listed below.  Will benefit from acute PT to maximize independence prior to discharge home.  Patient will have 24 hour assist (grandson and granddaughter).  Do not anticipate any f/u PT needs at d/c.    Follow Up Recommendations No PT follow up;Supervision/Assistance - 24 hour    Equipment Recommendations  Other (comment) (Patient requesting shower seat)    Recommendations for Other Services       Precautions / Restrictions Precautions Precautions: Fall Restrictions Weight Bearing Restrictions: No      Mobility  Bed Mobility Overal bed mobility: Independent             General bed mobility comments: Patient sat at EOB several seconds before standing for safety.  No c/o dizziness.  Transfers Overall transfer level: Needs assistance Equipment used: None Transfers: Sit to/from Stand Sit to Stand: Supervision         General transfer comment: Supervision for safety only.  No dizziness with standing.  Ambulation/Gait Ambulation/Gait assistance: Supervision Ambulation Distance (Feet): 68 Feet Assistive device: Rolling walker (2 wheeled) Gait Pattern/deviations: Step-through pattern;Decreased stride length Gait velocity: Decreased Gait velocity interpretation: Below normal speed for age/gender General Gait Details: Patient ambulated to bathroom with no assistive device, but reaching for sink, etc for stability.  Instructed patient on safe use of RW.  Balance improved with RW.  Supervision only.  Slight dyspnea (2/4) with ambulation.  Stairs            Wheelchair Mobility    Modified Rankin (Stroke  Patients Only)       Balance                                             Pertinent Vitals/Pain Pain Assessment: No/denies pain    Home Living Family/patient expects to be discharged to:: Private residence Living Arrangements: Alone (Daughter from [place] was staying with patient pta.) Available Help at Discharge: Family;Available 24 hours/day (Grandson and granddaughter providing 24 hour assist.) Type of Home: House Home Access: Stairs to enter Entrance Stairs-Rails: None Entrance Stairs-Number of Steps: 1 Home Layout: One level Home Equipment: Nurse, children's - 4 wheels (Electric bed - HOB elevates) Additional Comments: Patient reports she needs a shower seat    Prior Function Level of Independence: Independent         Comments: States her daughter fixes meals when she is visiting.     Hand Dominance        Extremity/Trunk Assessment   Upper Extremity Assessment: Overall WFL for tasks assessed           Lower Extremity Assessment: Generalized weakness      Cervical / Trunk Assessment: Normal  Communication   Communication: No difficulties  Cognition Arousal/Alertness: Awake/alert Behavior During Therapy: WFL for tasks assessed/performed Overall Cognitive Status: Within Functional Limits for tasks assessed                      General Comments      Exercises        Assessment/Plan  PT Assessment Patient needs continued PT services  PT Diagnosis Abnormality of gait;Generalized weakness   PT Problem List Decreased strength;Decreased activity tolerance;Decreased balance;Decreased mobility;Decreased knowledge of use of DME;Cardiopulmonary status limiting activity  PT Treatment Interventions DME instruction;Gait training;Functional mobility training;Therapeutic activities;Therapeutic exercise;Patient/family education   PT Goals (Current goals can be found in the Care Plan section) Acute Rehab PT Goals Patient Stated  Goal: To go home soon PT Goal Formulation: With patient Time For Goal Achievement: 07/03/14 Potential to Achieve Goals: Good    Frequency Min 3X/week   Barriers to discharge        Co-evaluation               End of Session Equipment Utilized During Treatment: Gait belt Activity Tolerance: Patient limited by fatigue Patient left: in bed;with call bell/phone within reach Nurse Communication: Mobility status (Slight DOE)    Functional Assessment Tool Used: Clinical judgement Functional Limitation: Mobility: Walking and moving around Mobility: Walking and Moving Around Current Status [phone]): At least 20 percent but less than 40 percent impaired, limited or restricted Mobility: Walking and Moving Around Goal Status [phone]): At least 1 percent but less than 20 percent impaired, limited or restricted    Time: 1752-1815 PT Time Calculation (min) (ACUTE ONLY): 23 min   Charges:   PT Evaluation $Initial PT Evaluation Tier I: 1 Procedure PT Treatments $Gait Training: 8-22 mins   PT G Codes:   PT G-Codes **NOT FOR INPATIENT CLASS** Functional Assessment Tool Used: Clinical judgement Functional Limitation: Mobility: Walking and moving around Mobility: Walking and Moving Around Current Status ([id number]): At least 20 percent but less than 40 percent impaired, limited or restricted Mobility: Walking and Moving Around Goal Status [phone]): At least 1 percent but less than 20 percent impaired, limited or restricted    Despina Pole 06/26/2014, 7:13 PM Carita Pian. Sanjuana Kava, [place] Pager [phone]

## 2014-06-26 NOTE — Progress Notes (Signed)
UR completed 

## 2014-06-26 NOTE — Progress Notes (Signed)
PROGRESS NOTE  Kathryn Whitaker EZM:[id number] DOB: 1919-10-02 DOA: 06/24/2014 PCP: Olga Millers, MD  Assessment/Plan: Near syncope -patient states she got very upset before her symptoms started- suspect related to this monitor her on telemetry. echocardiogram . asa  Abdominal pain. Constipation. Patient is on multiple medications for constipation.   Hypertension. Orthostatic drop. -resolved with IVF  PT eval   Code Status: full Family Communication: LM with son Disposition Plan: home once echo/PT done   Consultants:    Procedures:      HPI/Subjective: Up taking a bath, no c/o  Objective: Filed Vitals:   06/26/14 0606  BP: 136/63  Pulse: 94  Temp: 98 F (36.7 C)  Resp: 18    Intake/Output Summary (Last 24 hours) at 06/26/14 1145 Last data filed at 06/26/14 0700  Gross per 24 hour  Intake 1638.33 ml  Output    750 ml  Net 888.33 ml   Filed Weights   06/24/14 2136 06/25/14 0130 06/26/14 0606  Weight: 52.164 kg (115 lb) 50.213 kg (110 lb 11.2 oz) 53.071 kg (117 lb)    Exam:   General:  A+OX3, NAD  Cardiovascular: rrr  Respiratory: clear  Abdomen: +BS, soft  Musculoskeletal: no edema   Data Reviewed: Basic Metabolic Panel:  Recent Labs Lab 06/24/14 2149 06/25/14 0355  NA 140 141  K 4.1 3.9  CL 103 107  CO2 25 29  GLUCOSE 94 84  BUN 12 12  CREATININE 0.67 0.68  CALCIUM 10.0 9.1   Liver Function Tests:  Recent Labs Lab 06/25/14 0355  AST 17  ALT 13  ALKPHOS 61  BILITOT 0.8  PROT 5.9*  ALBUMIN 3.0*   No results for input(s): LIPASE, AMYLASE in the last 168 hours. No results for input(s): AMMONIA in the last 168 hours. CBC:  Recent Labs Lab 06/24/14 2149 06/25/14 0355  WBC 4.9 4.1  NEUTROABS  --  1.9  HGB 12.4 11.5*  HCT 38.0 35.2*  MCV 94.8 94.9  PLT 215 223   Cardiac Enzymes:  Recent Labs Lab 06/25/14 0355 06/25/14 0938 06/25/14 1456  TROPONINI 0.04* 0.03 <0.03   BNP (last 3  results)  Recent Labs  01/03/14 1923  PROBNP 186.2   CBG:  Recent Labs Lab 06/24/14 2225  GLUCAP 81    No results found for this or any previous visit (from the past 240 hour(s)).   Studies: Dg Chest 2 View  06/24/2014   CLINICAL DATA:  Right-sided neck pain. Feels like upper back is on fire in going down to the abdomen. Began with cold and flu Medicine. Patient is unstable and dizzy. Near syncope.  EXAM: CHEST  2 VIEW  COMPARISON:  04/23/2014  FINDINGS: Pulmonary hyperinflation. Normal heart size and pulmonary vascularity. No focal airspace disease or consolidation in the lungs. No blunting of costophrenic angles. No pneumothorax. Mediastinal contours appear intact. Degenerative changes in the shoulders and spine. Mild thoracolumbar scoliosis convex towards the left. Calcified and tortuous aorta.  IMPRESSION: Pulmonary hyperinflation.  No evidence of active pulmonary disease.   Electronically Signed   By: Lucienne Capers M.D.   On: 06/24/2014 23:22   Dg Abd Portable 1v  06/25/2014   CLINICAL DATA:  Constipation.  Left lower quadrant abdominal pain.  EXAM: PORTABLE ABDOMEN - 1 VIEW  COMPARISON:  04/23/2014  FINDINGS: Prominent stool throughout the colon favors constipation. No dilated small bowel observed.  Dextroconvex lumbar scoliosis with rotary component. Bony demineralization. Degenerative loss of articular space in both hips, left greater  than right.  IMPRESSION: 1.  Prominent stool throughout the colon favors constipation. 2. Lumbar scoliosis and spondylosis. Degenerative arthropathy of both hips.   Electronically Signed   By: Sherryl Barters M.D.   On: 06/25/2014 09:11    Scheduled Meds: . aspirin EC  325 mg Oral Daily  . atorvastatin  80 mg Oral Daily  . budesonide-formoterol  2 puff Inhalation BID  . docusate sodium  100 mg Oral BID  . feeding supplement (RESOURCE BREEZE)  1 Container Oral Q24H  . heparin  5,000 Units Subcutaneous 3 times per day  . lactose free nutrition   237 mL Oral Q24H  . mirtazapine  15 mg Oral QHS  . sodium chloride  3 mL Intravenous Q12H   Continuous Infusions: . sodium chloride 50 mL/hr at 06/25/14 1238   Antibiotics Given (last 72 hours)    None      Principal Problem:   Near syncope Active Problems:   GERD   HTN (hypertension)   COPD (chronic obstructive pulmonary disease)   Constipation   Chest pain   Abdominal pain   Protein-calorie malnutrition, severe    Time spent: 25 min    Devi Hopman  [facility] Pager [phone]. If 7PM-7AM, please contact night-coverage at [email or web address], password [facility] 06/26/2014, 11:45 AM  LOS: 2 days

## 2014-06-27 DIAGNOSIS — K5909 Other constipation: Secondary | ICD-10-CM

## 2014-06-27 DIAGNOSIS — I369 Nonrheumatic tricuspid valve disorder, unspecified: Secondary | ICD-10-CM

## 2014-06-27 MED ORDER — MIRTAZAPINE 15 MG PO TABS: 15.0000 mg | ORAL_TABLET | Freq: Every day | ORAL | Status: DC

## 2014-06-27 MED ORDER — GUAIFENESIN-DM 100-10 MG/5ML PO SYRP: 5.0000 mL | ORAL_SOLUTION | ORAL | Status: DC | PRN

## 2014-06-27 MED ORDER — BUDESONIDE-FORMOTEROL FUMARATE 80-4.5 MCG/ACT IN AERO: 2.0000 | INHALATION_SPRAY | Freq: Two times a day (BID) | RESPIRATORY_TRACT | Status: DC | PRN

## 2014-06-27 MED ORDER — OMEPRAZOLE 20 MG PO CPDR: 20.0000 mg | DELAYED_RELEASE_CAPSULE | Freq: Every day | ORAL | Status: DC

## 2014-06-27 MED ORDER — METOPROLOL SUCCINATE ER 25 MG PO TB24: 25.0000 mg | ORAL_TABLET | Freq: Every day | ORAL | Status: DC

## 2014-06-27 MED ORDER — LOSARTAN POTASSIUM 25 MG PO TABS: 25.0000 mg | ORAL_TABLET | Freq: Every day | ORAL | Status: DC

## 2014-06-27 MED ORDER — ALBUTEROL SULFATE HFA 108 (90 BASE) MCG/ACT IN AERS: 2.0000 | INHALATION_SPRAY | Freq: Four times a day (QID) | RESPIRATORY_TRACT | Status: DC | PRN

## 2014-06-27 NOTE — Progress Notes (Signed)
UR completed 

## 2014-06-27 NOTE — Progress Notes (Signed)
  Echocardiogram 2D Echocardiogram has been performed.  Bobbye Charleston 06/27/2014, 9:06 AM

## 2014-07-01 ENCOUNTER — Ambulatory Visit: Payer: Medicare Other | Admitting: Internal Medicine

## 2014-07-05 NOTE — Discharge Summary (Addendum)
Physician Discharge Summary  Kathryn Whitaker GMW:[id number] DOB: [birth year] DOA: 06/24/2014  PCP: Olga Millers, MD  Admit date: 06/24/2014 Discharge date: 06/27/14  1. Time spent: greater than 30 minutesa  Discharge Diagnoses:  Principal Problem:   Near syncope Active Problems:   GERD   HTN (hypertension)   COPD (chronic obstructive pulmonary disease)   Constipation   Chest pain   Abdominal pain   Protein-calorie malnutrition, severe   Discharge Condition: stable  Filed Weights   06/25/14 0130 06/26/14 0606 06/27/14 0552  Weight: 50.213 kg (110 lb 11.2 oz) 53.071 kg (117 lb) 52.844 kg (116 lb 8 oz)    History of present illness:   81 y.o. female with Past medical history of hypertension, GERD, irritable bowel syndrome with chronic constipation requiring recurrent stool disimpaction, chronic pain. The patient is presenting with complaints of dizziness. SHe mentions she has cough for last couple of weeks and just started taking the Mucinex. She was eating okay and was living with her daughter for last 1 week. Did not have any diarrhea or nausea or vomiting. But today after taking the Mucinex and magnesium she felt that she was going to pass out and she had some dizziness that lasted for 15-20 minutes. She did not have any chest pain initially but later on she also had some chest pain which lasted for a few seconds. The pain was located in the center of the chest. She has chronic abdominal pain which occurs on and off and currently she has some pain on her left side of the abdomen. Last bowel movement was yesterday.  Hospital Course:  Admitted to telemetry. Has been losing weight, and did have orthostatic decrease in blood pressure. May have contributed.  Given IVF and dietitian consulted. PT evaluated.  Started on bowel regimen with good results.  Workup essentially unremarkable  Procedures:  none  Consultations:  none  Discharge Exam: Filed Vitals:   06/27/14  0955  BP:   Pulse:   Temp:   Resp: 16    General: comfortable Cardiovascular: RRR Respiratory: CTA  Discharge Instructions   Discharge Instructions    Diet - low sodium heart healthy    Complete by:  As directed      Discharge instructions    Complete by:  As directed   NEW LOSARTAN DOSE: 25 MG INSTEAD OF 50 MG.     Increase activity slowly    Complete by:  As directed           Discharge Medication List as of 06/27/2014  1:32 PM    START taking these medications   Details  metoprolol succinate (TOPROL XL) 25 MG 24 hr tablet Take 1 tablet (25 mg total) by mouth daily., Starting 06/27/2014, Until Discontinued, Print      CONTINUE these medications which have CHANGED   Details  albuterol (PROVENTIL HFA) 108 (90 BASE) MCG/ACT inhaler Inhale 2 puffs into the lungs every 6 (six) hours as needed. For shortness of breath, Starting 06/27/2014, Until Discontinued, Print    budesonide-formoterol (SYMBICORT) 80-4.5 MCG/ACT inhaler Inhale 2 puffs into the lungs 2 (two) times daily as needed. For shortness of breath, Starting 06/27/2014, Until Discontinued, Print    losartan (COZAAR) 25 MG tablet Take 1 tablet (25 mg total) by mouth daily., Starting 06/27/2014, Until Discontinued, Print    mirtazapine (REMERON) 15 MG tablet Take 1 tablet (15 mg total) by mouth at bedtime., Starting 06/27/2014, Until Discontinued, Print    omeprazole (PRILOSEC) 20 MG capsule  Take 1 capsule (20 mg total) by mouth daily., Starting 06/27/2014, Until Discontinued, Print      CONTINUE these medications which have NOT CHANGED   Details  acetaminophen (TYLENOL) 325 MG tablet Take 2 tablets (650 mg total) by mouth every 6 (six) hours as needed., Starting 12/14/2012, Until Discontinued, No Print    aspirin EC 81 MG tablet Take 81 mg by mouth daily., Until Discontinued, Historical Med    atorvastatin (LIPITOR) 80 MG tablet TAKE 1 TABLET BY MOUTH DAILY, Normal    Calcium Carbonate-Vitamin D (CALTRATE 600+D) 600-400  MG-UNIT per tablet Take 1 tablet by mouth daily. For bone health, Until Discontinued, Historical Med    Cholecalciferol (VITAMIN D) 1000 UNITS capsule Take 1,000 Units by mouth daily.  , Until Discontinued, Historical Med    docusate sodium (COLACE) 100 MG capsule Take 1 capsule (100 mg total) by mouth every 12 (twelve) hours., Starting 11/24/2013, Until Discontinued, Print    emollient (BIAFINE) cream Apply topically daily., Starting 03/31/2014, Until Discontinued, Normal    fish oil-omega-3 fatty acids 1000 MG capsule Take 1 g by mouth 2 (two) times daily. , Until Discontinued, Historical Med    psyllium (METAMUCIL) 58.6 % packet Take 1 packet by mouth daily., Until Discontinued, Historical Med    white petrolatum (VASELINE) GEL Apply 1 application topically daily as needed for dry skin (arms, legs, feet.)., Until Discontinued, Historical Med       Allergies  Allergen Reactions  . Demerol [Meperidine]     Pt states she can't remember type of reaction  . Influenza Vaccines     Pt can't remember reaction but states allergic and has not had in 25 years  . Telithromycin Hives    REACTION: pt states HIVES....   Follow-up Information    Follow up with OSEI-BONSU,GEORGE, MD In 2 weeks.   Specialty:  Internal Medicine   Contact information:   [address] [place] [address] [phone]        The results of significant diagnostics from this hospitalization (including imaging, microbiology, ancillary and laboratory) are listed below for reference.    Significant Diagnostic Studies: Dg Chest 2 View  06/24/2014   CLINICAL DATA:  Right-sided neck pain. Feels like upper back is on fire in going down to the abdomen. Began with cold and flu Medicine. Patient is unstable and dizzy. Near syncope.  EXAM: CHEST  2 VIEW  COMPARISON:  04/23/2014  FINDINGS: Pulmonary hyperinflation. Normal heart size and pulmonary vascularity. No focal airspace disease or consolidation in the  lungs. No blunting of costophrenic angles. No pneumothorax. Mediastinal contours appear intact. Degenerative changes in the shoulders and spine. Mild thoracolumbar scoliosis convex towards the left. Calcified and tortuous aorta.  IMPRESSION: Pulmonary hyperinflation.  No evidence of active pulmonary disease.   Electronically Signed   By: Lucienne Capers M.D.   On: 06/24/2014 23:22   Dg Abd Portable 1v  06/25/2014   CLINICAL DATA:  Constipation.  Left lower quadrant abdominal pain.  EXAM: PORTABLE ABDOMEN - 1 VIEW  COMPARISON:  04/23/2014  FINDINGS: Prominent stool throughout the colon favors constipation. No dilated small bowel observed.  Dextroconvex lumbar scoliosis with rotary component. Bony demineralization. Degenerative loss of articular space in both hips, left greater than right.  IMPRESSION: 1.  Prominent stool throughout the colon favors constipation. 2. Lumbar scoliosis and spondylosis. Degenerative arthropathy of both hips.   Electronically Signed   By: Sherryl Barters M.D.   On: 06/25/2014 09:11  Echo  Left ventricle: The cavity size was normal. Wall thickness was increased in a pattern of severe LVH. There was mild focal basal hypertrophy of the septum. Systolic function was vigorous. The estimated ejection fraction was in the range of 65% to 70%. There was dynamic obstruction. Wall motion was normal; there were no regional wall motion abnormalities. Doppler parameters are consistent with abnormal left ventricular relaxation (grade 1 diastolic dysfunction). - Aortic valve: Valve area (VTI): 2.27 cm^2. Valve area (Vmax): 2.34 cm^2. Valve area (Vmean): 2.08 cm^2.  Microbiology: No results found for this or any previous visit (from the past 240 hour(s)).   Labs: Basic Metabolic Panel: No results for input(s): NA, K, CL, CO2, GLUCOSE, BUN, CREATININE, CALCIUM, MG, PHOS in the last 168 hours. Liver Function Tests: No results for input(s): AST, ALT, ALKPHOS,  BILITOT, PROT, ALBUMIN in the last 168 hours. No results for input(s): LIPASE, AMYLASE in the last 168 hours. No results for input(s): AMMONIA in the last 168 hours. CBC: No results for input(s): WBC, NEUTROABS, HGB, HCT, MCV, PLT in the last 168 hours. Cardiac Enzymes: No results for input(s): CKTOTAL, CKMB, CKMBINDEX, TROPONINI in the last 168 hours. BNP: BNP (last 3 results)  Recent Labs  01/03/14 1923  PROBNP 186.2   CBG: No results for input(s): GLUCAP in the last 168 hours.     SignedDelfina Redwood  [facility] 07/05/2014, 8:22 PM

## 2014-08-03 ENCOUNTER — Ambulatory Visit: Payer: BC Managed Care – HMO | Admitting: Podiatry

## 2015-01-23 ENCOUNTER — Emergency Department (HOSPITAL_COMMUNITY)
Admission: EM | Admit: 2015-01-23 | Discharge: 2015-01-23 | Disposition: A | Discharge status: Home / Self Care | Payer: Medicare Other | Attending: Emergency Medicine | Admitting: Emergency Medicine

## 2015-01-23 ENCOUNTER — Encounter (HOSPITAL_COMMUNITY): Payer: Self-pay | Admitting: *Deleted

## 2015-01-23 ENCOUNTER — Emergency Department (HOSPITAL_COMMUNITY): Payer: Medicare Other

## 2015-01-23 DIAGNOSIS — K5900 Constipation, unspecified: Secondary | ICD-10-CM | POA: Insufficient documentation

## 2015-01-23 DIAGNOSIS — E780 Pure hypercholesterolemia: Secondary | ICD-10-CM | POA: Diagnosis not present

## 2015-01-23 DIAGNOSIS — R300 Dysuria: Secondary | ICD-10-CM | POA: Diagnosis not present

## 2015-01-23 DIAGNOSIS — Z8739 Personal history of other diseases of the musculoskeletal system and connective tissue: Secondary | ICD-10-CM | POA: Diagnosis not present

## 2015-01-23 DIAGNOSIS — R112 Nausea with vomiting, unspecified: Secondary | ICD-10-CM | POA: Diagnosis not present

## 2015-01-23 DIAGNOSIS — Z79899 Other long term (current) drug therapy: Secondary | ICD-10-CM | POA: Diagnosis not present

## 2015-01-23 DIAGNOSIS — K219 Gastro-esophageal reflux disease without esophagitis: Secondary | ICD-10-CM | POA: Insufficient documentation

## 2015-01-23 DIAGNOSIS — M549 Dorsalgia, unspecified: Secondary | ICD-10-CM | POA: Diagnosis not present

## 2015-01-23 DIAGNOSIS — E559 Vitamin D deficiency, unspecified: Secondary | ICD-10-CM | POA: Insufficient documentation

## 2015-01-23 DIAGNOSIS — F419 Anxiety disorder, unspecified: Secondary | ICD-10-CM | POA: Diagnosis not present

## 2015-01-23 DIAGNOSIS — R109 Unspecified abdominal pain: Secondary | ICD-10-CM | POA: Diagnosis present

## 2015-01-23 DIAGNOSIS — R1084 Generalized abdominal pain: Secondary | ICD-10-CM | POA: Insufficient documentation

## 2015-01-23 DIAGNOSIS — R079 Chest pain, unspecified: Secondary | ICD-10-CM | POA: Diagnosis not present

## 2015-01-23 DIAGNOSIS — I10 Essential (primary) hypertension: Secondary | ICD-10-CM | POA: Insufficient documentation

## 2015-01-23 DIAGNOSIS — J45909 Unspecified asthma, uncomplicated: Secondary | ICD-10-CM | POA: Insufficient documentation

## 2015-01-23 DIAGNOSIS — Z87440 Personal history of urinary (tract) infections: Secondary | ICD-10-CM | POA: Diagnosis not present

## 2015-01-23 DIAGNOSIS — Z86010 Personal history of colonic polyps: Secondary | ICD-10-CM | POA: Diagnosis not present

## 2015-01-23 DIAGNOSIS — G8929 Other chronic pain: Secondary | ICD-10-CM | POA: Insufficient documentation

## 2015-01-23 LAB — I-STAT CHEM 8, ED
BUN: 19 mg/dL (ref 6–20)
Calcium, Ion: 1.17 mmol/L (ref 1.13–1.30)
Chloride: 107 mmol/L (ref 101–111)
Creatinine, Ser: 0.6 mg/dL (ref 0.44–1.00)
Glucose, Bld: 75 mg/dL (ref 65–99)
HEMATOCRIT: 45 % (ref 36.0–46.0)
Hemoglobin: 15.3 g/dL — ABNORMAL HIGH (ref 12.0–15.0)
Potassium: 4.1 mmol/L (ref 3.5–5.1)
Sodium: 141 mmol/L (ref 135–145)
TCO2: 24 mmol/L (ref 0–100)

## 2015-01-23 LAB — COMPREHENSIVE METABOLIC PANEL
ALBUMIN: 4.1 g/dL (ref 3.5–5.0)
ALK PHOS: 63 U/L (ref 38–126)
ALT: 18 U/L (ref 14–54)
ANION GAP: 5 (ref 5–15)
AST: 23 U/L (ref 15–41)
BILIRUBIN TOTAL: 1.2 mg/dL (ref 0.3–1.2)
BUN: 15 mg/dL (ref 6–20)
CALCIUM: 9.2 mg/dL (ref 8.9–10.3)
CHLORIDE: 108 mmol/L (ref 101–111)
CO2: 26 mmol/L (ref 22–32)
CREATININE: 0.53 mg/dL (ref 0.44–1.00)
GFR calc Af Amer: >60 mL/min (ref >60)
Glucose, Bld: 78 mg/dL (ref 65–99)
POTASSIUM: 4 mmol/L (ref 3.5–5.1)
SODIUM: 139 mmol/L (ref 135–145)
Total Protein: 7.2 g/dL (ref 6.5–8.1)

## 2015-01-23 LAB — URINALYSIS, ROUTINE W REFLEX MICROSCOPIC
Bilirubin Urine: NEGATIVE
Glucose, UA: NEGATIVE mg/dL
HGB URINE DIPSTICK: NEGATIVE
Ketones, ur: NEGATIVE mg/dL
Leukocytes, UA: NEGATIVE
Nitrite: NEGATIVE
PH: 7.5 (ref 5.0–8.0)
PROTEIN: NEGATIVE mg/dL
SPECIFIC GRAVITY, URINE: 1.006 (ref 1.005–1.030)
Urobilinogen, UA: 0.2 mg/dL (ref 0.0–1.0)

## 2015-01-23 LAB — CBC WITH DIFFERENTIAL/PLATELET
BASOS PCT: 1 % (ref 0–1)
Basophils Absolute: 0 10*3/uL (ref 0.0–0.1)
Eosinophils Absolute: 0 10*3/uL (ref 0.0–0.7)
Eosinophils Relative: 1 % (ref 0–5)
HCT: 43.3 % (ref 36.0–46.0)
HEMOGLOBIN: 14.1 g/dL (ref 12.0–15.0)
Lymphocytes Relative: 53 % — ABNORMAL HIGH (ref 12–46)
Lymphs Abs: 1.9 10*3/uL (ref 0.7–4.0)
MCH: 30.7 pg (ref 26.0–34.0)
MCHC: 32.6 g/dL (ref 30.0–36.0)
MCV: 94.1 fL (ref 78.0–100.0)
MONOS PCT: 8 % (ref 3–12)
Monocytes Absolute: 0.3 10*3/uL (ref 0.1–1.0)
NEUTROS ABS: 1.3 10*3/uL — AB (ref 1.7–7.7)
Neutrophils Relative %: 37 % — ABNORMAL LOW (ref 43–77)
Platelets: 183 10*3/uL (ref 150–400)
RBC: 4.6 MIL/uL (ref 3.87–5.11)
RDW: 13.1 % (ref 11.5–15.5)
WBC: 3.6 10*3/uL — ABNORMAL LOW (ref 4.0–10.5)

## 2015-01-23 LAB — I-STAT TROPONIN, ED: Troponin i, poc: 0.01 ng/mL (ref 0.00–0.08)

## 2015-01-23 LAB — LIPASE, BLOOD: Lipase: 24 U/L (ref 22–51)

## 2015-01-23 MED ORDER — METRONIDAZOLE 500 MG PO TABS
500.0000 mg | ORAL_TABLET | Freq: Once | ORAL | Status: AC
  Administered 2015-01-23: 500 mg via ORAL
  Filled 2015-01-23: qty 1

## 2015-01-23 MED ORDER — METRONIDAZOLE 500 MG PO TABS: 500.0000 mg | ORAL_TABLET | Freq: Two times a day (BID) | ORAL | Status: DC

## 2015-01-23 MED ORDER — ONDANSETRON HCL 4 MG/2ML IJ SOLN
4.0000 mg | Freq: Once | INTRAMUSCULAR | Status: AC
  Administered 2015-01-23: 4 mg via INTRAVENOUS
  Filled 2015-01-23: qty 2

## 2015-01-23 MED ORDER — CIPROFLOXACIN HCL 500 MG PO TABS: 500.0000 mg | ORAL_TABLET | Freq: Two times a day (BID) | ORAL | Status: DC

## 2015-01-23 MED ORDER — MORPHINE SULFATE 2 MG/ML IJ SOLN
2.0000 mg | Freq: Once | INTRAMUSCULAR | Status: AC
  Administered 2015-01-23: 2 mg via INTRAVENOUS
  Filled 2015-01-23: qty 1

## 2015-01-23 MED ORDER — CIPROFLOXACIN HCL 500 MG PO TABS
500.0000 mg | ORAL_TABLET | Freq: Once | ORAL | Status: AC
  Administered 2015-01-23: 500 mg via ORAL
  Filled 2015-01-23: qty 1

## 2015-01-23 MED ORDER — SODIUM CHLORIDE 0.9 % IV BOLUS (SEPSIS)
500.0000 mL | Freq: Once | INTRAVENOUS | Status: AC
  Administered 2015-01-23: 500 mL via INTRAVENOUS

## 2015-01-23 MED ORDER — IOHEXOL 300 MG/ML  SOLN
50.0000 mL | Freq: Once | INTRAMUSCULAR | Status: AC | PRN
  Administered 2015-01-23: 50 mL via ORAL

## 2015-01-23 MED ORDER — IOHEXOL 300 MG/ML  SOLN
100.0000 mL | Freq: Once | INTRAMUSCULAR | Status: AC | PRN
  Administered 2015-01-23: 80 mL via INTRAVENOUS

## 2015-01-23 NOTE — ED Provider Notes (Signed)
CSN: [id number]     Arrival date & time 01/23/15  1229 History   First MD Initiated Contact with Patient 01/23/15 1300     Chief Complaint  Patient presents with  . Abdominal Pain     (Consider location/radiation/quality/duration/timing/severity/associated sxs/prior Treatment) HPI  81 year old female presents with a chief complaint of vomiting. History taken from the patient and the granddaughter at the bedside. Patient has been having chronic abdominal pain, mostly lower, for the past several months to year. She has been seen by her PCP and originally put on triple therapy for presumed H. pylori. She did not tolerate the medicine well and last week she went back because of recurrent pain and they gave her the same medicines. She has not started this because of her intolerance from last year. However over the last 2 days she's been having vomiting. No diarrhea, last bowel movement 3 days ago. Patient has a long-standing history of constipation. Patient also endorses chest pain but the granddaughter notes that this is also been a nearly daily occurrence for several weeks to months. The only thing new per the granddaughter is the vomiting and wanting a specific treatment for her pain. Patient endorses dysuria but it is unclear how long this is been going on.  Past Medical History  Diagnosis Date  . Allergic rhinitis   . Acute asthmatic bronchitis   . Hypertension   . Hypercholesteremia   . GERD (gastroesophageal reflux disease)   . Diverticulosis of colon   . IBS (irritable bowel syndrome)   . History of colonic polyps   . UTI (lower urinary tract infection)   . DJD (degenerative joint disease)   . Lumbar back pain   . Lacunar infarction   . Vitamin D deficiency   . Anxiety    Past Surgical History  Procedure Laterality Date  . Abdominal hysterectomy     No family history on file. History  Substance Use Topics  . Smoking status: Never Smoker   . Smokeless tobacco: Never Used  .  Alcohol Use: No   OB History    No data available     Review of Systems  Constitutional: Negative for fever.  Respiratory: Negative for shortness of breath.   Cardiovascular: Positive for chest pain.  Gastrointestinal: Positive for nausea, vomiting, abdominal pain and constipation.  Genitourinary: Positive for dysuria.  Musculoskeletal: Positive for back pain.  All other systems reviewed and are negative.     Allergies  Demerol; Influenza vaccines; Prevpac; and Telithromycin  Home Medications   Prior to Admission medications   Medication Sig Start Date End Date Taking? Authorizing Provider  albuterol (PROVENTIL HFA) 108 (90 BASE) MCG/ACT inhaler Inhale 2 puffs into the lungs every 6 (six) hours as needed. For shortness of breath 06/27/14  Yes Delfina Redwood, MD  ALPRAZolam Duanne Moron) 0.25 MG tablet Take 0.25 mg by mouth 3 (three) times daily as needed for anxiety.   Yes Historical Provider, MD  amLODipine (NORVASC) 2.5 MG tablet Take 2.5 mg by mouth daily.   Yes Historical Provider, MD  aspirin EC 81 MG tablet Take 81 mg by mouth daily.   Yes Historical Provider, MD  atorvastatin (LIPITOR) 80 MG tablet TAKE 1 TABLET BY MOUTH DAILY 06/02/14  Yes Noralee Space, MD  budesonide-formoterol [facility]) 80-4.5 MCG/ACT inhaler Inhale 2 puffs into the lungs 2 (two) times daily as needed. For shortness of breath 06/27/14  Yes Delfina Redwood, MD  Cholecalciferol (VITAMIN D) 1000 UNITS capsule Take 1,000 Units  by mouth daily.     Yes Historical Provider, MD  fish oil-omega-3 fatty acids 1000 MG capsule Take 1 g by mouth 3 (three) times daily.    Yes Historical Provider, MD  mirtazapine (REMERON) 15 MG tablet Take 1 tablet (15 mg total) by mouth at bedtime. 06/27/14  Yes Delfina Redwood, MD  omeprazole (PRILOSEC) 20 MG capsule Take 1 capsule (20 mg total) by mouth daily. 06/27/14  Yes Delfina Redwood, MD  psyllium (METAMUCIL) 58.6 % packet Take 1 packet by mouth 3 (three) times daily.    Yes  Historical Provider, MD  urea (CARMOL) 20 % cream Apply 1 application topically as needed (for itching).   Yes Historical Provider, MD  white petrolatum (VASELINE) GEL Apply 1 application topically daily as needed for dry skin (arms, legs, feet.).   Yes Historical Provider, MD  acetaminophen (TYLENOL) 325 MG tablet Take 2 tablets (650 mg total) by mouth every 6 (six) hours as needed. Patient not taking: Reported on 01/23/2015 12/14/12   Venetia Maxon Rama, MD  docusate sodium (COLACE) 100 MG capsule Take 1 capsule (100 mg total) by mouth every 12 (twelve) hours. Patient not taking: Reported on 01/23/2015 11/24/13   Sherwood Gambler, MD  emollient (BIAFINE) cream Apply topically daily. Patient not taking: Reported on 01/23/2015 03/31/14   Olga Millers, MD  losartan (COZAAR) 25 MG tablet Take 1 tablet (25 mg total) by mouth daily. Patient not taking: Reported on 01/23/2015 06/27/14   Delfina Redwood, MD  metoprolol succinate (TOPROL XL) 25 MG 24 hr tablet Take 1 tablet (25 mg total) by mouth daily. Patient not taking: Reported on 01/23/2015 06/27/14   Delfina Redwood, MD   BP 164/77 mmHg  Pulse 62  Temp(Src) 97.8 F (36.6 C) (Oral)  Resp 16  SpO2 97% Physical Exam  Constitutional: She is oriented to person, place, and time. She appears well-developed and well-nourished. No distress.  HENT:  Head: Normocephalic and atraumatic.  Right Ear: External ear normal.  Left Ear: External ear normal.  Nose: Nose normal.  Eyes: Right eye exhibits no discharge. Left eye exhibits no discharge.  Cardiovascular: Normal rate, regular rhythm and normal heart sounds.   Pulmonary/Chest: Effort normal and breath sounds normal.  Abdominal: Soft. She exhibits no distension. There is tenderness.  Soft abd, diffuse tenderness  Neurological: She is alert and oriented to person, place, and time.  Skin: Skin is warm and dry. She is not diaphoretic.  Nursing note and vitals reviewed.   ED Course  Procedures (including  critical care time) Labs Review Labs Reviewed  CBC WITH DIFFERENTIAL/PLATELET - Abnormal; Notable for the following:    WBC 3.6 (*)    Neutrophils Relative % 37 (*)    Neutro Abs 1.3 (*)    Lymphocytes Relative 53 (*)    All other components within normal limits  I-STAT CHEM 8, ED - Abnormal; Notable for the following:    Hemoglobin 15.3 (*)    All other components within normal limits  URINALYSIS, ROUTINE W REFLEX MICROSCOPIC (NOT AT [facility])  COMPREHENSIVE METABOLIC PANEL  LIPASE, BLOOD  I-STAT TROPOININ, ED    Imaging Review Ct Abdomen Pelvis W Contrast  01/23/2015   CLINICAL DATA:  81 year old female with upper and lower abdominal pain for the past year. Diagnose with H pylori infection receiving treatment. Initial encounter.  EXAM: CT ABDOMEN AND PELVIS WITH CONTRAST  TECHNIQUE: Multidetector CT imaging of the abdomen and pelvis was performed using the standard protocol following  bolus administration of intravenous contrast.  CONTRAST:  28mL OMNIPAQUE IOHEXOL 300 MG/ML SOLN, 26mL OMNIPAQUE IOHEXOL 300 MG/ML SOLN  COMPARISON:  05/25/2008 CT.  FINDINGS: Adjacent to the descending colon is a nonspecific heterogeneous complex 3.2 x 3.2 x 3.6 cm structure previously appearing homogeneous and measuring 2.5 x 2.7 x 2.5 cm (2009). With the slow growth over time, it is possible this represents a low-grade malignancy. Result of walled-off inflammatory/infectious process is a secondary consideration.  Scattered colonic diverticular. Small amount of free fluid in the pelvis. Third spacing of fluid may contribute to free fluid however, bowel inflammatory process not entirely excluded. This fluid is not centered at the level of diverticula.  No free intraperitoneal air.  Low-density structures suggestive of cysts within the liver and kidneys bilaterally have progressed slightly since the prior exam. No worrisome hepatic, splenic, pancreatic, renal or adrenal lesion. Hyperplasia adrenal glands more notable on  the left.  Dilated noncontrast filled urinary bladder without gross abnormality. No uterine or adnexal abnormality.  Atherosclerotic type changes abdominal aorta are with ectasia. Atherosclerotic type changes iliac arteries with ectasia. Narrowing of the celiac artery, narrowing of the celiac artery, superior mesenteric artery and renal arteries.  Scoliosis and degenerative changes. Bilateral hip joint degenerative changes. No osseous destructive lesion.  Lung bases clear.  Heart slightly enlarged.  IMPRESSION: Adjacent to the descending colon is a nonspecific heterogeneous complex 3.2 x 3.2 x 3.6 cm structure previously appearing homogeneous and measuring 2.5 x 2.7 x 2.5 cm (2009). With the slow growth over time, it is possible this represents a low-grade malignancy. Result of walled-off inflammatory/infectious process is a secondary consideration.  Scattered colonic diverticular. Small amount of free fluid in the pelvis. Third spacing of fluid may contribute to free fluid however, bowel inflammatory process not entirely excluded. However, this fluid is not centered at the level of diverticula.  No free intraperitoneal air.  Atherosclerotic type changes as detailed above.   Electronically Signed   By: Genia Del M.D.   On: 01/23/2015 15:26     EKG Interpretation   Date/Time:  Monday January 23 2015 13:12:42 EDT Ventricular Rate:  66 PR Interval:  193 QRS Duration: 81 QT Interval:  438 QTC Calculation: 459 R Axis:   -31 Text Interpretation:  Sinus rhythm Left axis deviation Probable  anteroseptal infarct no significant change since Jan 2016 Confirmed by  Regenia Skeeter  MD, Rashed Edler ([id number]) on 01/23/2015 1:18:40 PM      MDM   Final diagnoses:  Abdominal pain, unspecified abdominal location  Nausea and vomiting in adult    Patient has not had any vomiting while in the ER. Patient's CT scan shows a slow-growing complex structure in her descending colon. This has been slowly growing since 2009. Could be  malignancy although this would be slow and growth. There is possible inflammation surrounding this with a small amount of free fluid. Given this with a change in pain and new vomiting, will treat with oral antibiotics. Discussed care with patient and family who is comfortable with going home with antibiotics and follow-up with PCP.    Sherwood Gambler, MD 01/23/15 [phone]

## 2015-01-23 NOTE — ED Notes (Signed)
Came back to get labs - pt in CT.

## 2015-01-23 NOTE — ED Notes (Signed)
Only able to get I stat - pt blood flow very slow.  RN is going to start fluids.  Will try again in 30 min.

## 2015-01-23 NOTE — ED Notes (Signed)
Bed: TG25 Expected date:  Expected time:  Means of arrival:  Comments: EMS-chronic pain, H-pylori

## 2015-01-23 NOTE — ED Notes (Addendum)
Per EMS complains of upper and lower abdominal pain. Pt states she has had this pain for over a year. Pt was diagnosed with H. Pylori infection and started on triple therapy. Pt states she stopped taking the medication when she broke out in hives.  Pt denies nausea at this time, but states she did vomit last night.

## 2015-01-23 NOTE — Discharge Instructions (Signed)
Abdominal Pain Many things can cause abdominal pain. Usually, abdominal pain is not caused by a disease and will improve without treatment. It can often be observed and treated at home. Your health care provider will do a physical exam and possibly order blood tests and X-rays to help determine the seriousness of your pain. However, in many cases, more time must pass before a clear cause of the pain can be found. Before that point, your health care provider may not know if you need more testing or further treatment. HOME CARE INSTRUCTIONS  Monitor your abdominal pain for any changes. The following actions may help to alleviate any discomfort you are experiencing:  Only take over-the-counter or prescription medicines as directed by your health care provider.  Do not take laxatives unless directed to do so by your health care provider.  Try a clear liquid diet (broth, tea, or water) as directed by your health care provider. Slowly move to a bland diet as tolerated. SEEK MEDICAL CARE IF:  You have unexplained abdominal pain.  You have abdominal pain associated with nausea or diarrhea.  You have pain when you urinate or have a bowel movement.  You experience abdominal pain that wakes you in the night.  You have abdominal pain that is worsened or improved by eating food.  You have abdominal pain that is worsened with eating fatty foods.  You have a fever. SEEK IMMEDIATE MEDICAL CARE IF:   Your pain does not go away within 2 hours.  You keep throwing up (vomiting).  Your pain is felt only in portions of the abdomen, such as the right side or the left lower portion of the abdomen.  You pass bloody or black tarry stools. MAKE SURE YOU:  Understand these instructions.   Will watch your condition.   Will get help right away if you are not doing well or get worse.  Document Released: 03/20/2005 Document Revised: 06/15/2013 Document Reviewed: 02/17/2013 [employer] Patient Information  2015 [place], [place]. This information is not intended to replace advice given to you by your health care provider. Make sure you discuss any questions you have with your health care provider.    Nausea and Vomiting Nausea is a sick feeling that often comes before throwing up (vomiting). Vomiting is a reflex where stomach contents come out of your mouth. Vomiting can cause severe loss of body fluids (dehydration). Children and elderly adults can become dehydrated quickly, especially if they also have diarrhea. Nausea and vomiting are symptoms of a condition or disease. It is important to find the cause of your symptoms. CAUSES   Direct irritation of the stomach lining. This irritation can result from increased acid production (gastroesophageal reflux disease), infection, food poisoning, taking certain medicines (such as nonsteroidal anti-inflammatory drugs), alcohol use, or tobacco use.  Signals from the brain.These signals could be caused by a headache, heat exposure, an inner ear disturbance, increased pressure in the brain from injury, infection, a tumor, or a concussion, pain, emotional stimulus, or metabolic problems.  An obstruction in the gastrointestinal tract (bowel obstruction).  Illnesses such as diabetes, hepatitis, gallbladder problems, appendicitis, kidney problems, cancer, sepsis, atypical symptoms of a heart attack, or eating disorders.  Medical treatments such as chemotherapy and radiation.  Receiving medicine that makes you sleep (general anesthetic) during surgery. DIAGNOSIS Your caregiver may ask for tests to be done if the problems do not improve after a few days. Tests may also be done if symptoms are severe or if the reason for  the nausea and vomiting is not clear. Tests may include:  Urine tests.  Blood tests.  Stool tests.  Cultures (to look for evidence of infection).  X-rays or other imaging studies. Test results can help your caregiver make decisions about  treatment or the need for additional tests. TREATMENT You need to stay well hydrated. Drink frequently but in small amounts.You may wish to drink water, sports drinks, clear broth, or eat frozen ice pops or gelatin dessert to help stay hydrated.When you eat, eating slowly may help prevent nausea.There are also some antinausea medicines that may help prevent nausea. HOME CARE INSTRUCTIONS   Take all medicine as directed by your caregiver.  If you do not have an appetite, do not force yourself to eat. However, you must continue to drink fluids.  If you have an appetite, eat a normal diet unless your caregiver tells you differently.  Eat a variety of complex carbohydrates (rice, wheat, potatoes, bread), lean meats, yogurt, fruits, and vegetables.  Avoid high-fat foods because they are more difficult to digest.  Drink enough water and fluids to keep your urine clear or pale yellow.  If you are dehydrated, ask your caregiver for specific rehydration instructions. Signs of dehydration may include:  Severe thirst.  Dry lips and mouth.  Dizziness.  Dark urine.  Decreasing urine frequency and amount.  Confusion.  Rapid breathing or pulse. SEEK IMMEDIATE MEDICAL CARE IF:   You have blood or brown flecks (like coffee grounds) in your vomit.  You have black or bloody stools.  You have a severe headache or stiff neck.  You are confused.  You have severe abdominal pain.  You have chest pain or trouble breathing.  You do not urinate at least once every 8 hours.  You develop cold or clammy skin.  You continue to vomit for longer than 24 to 48 hours.  You have a fever. MAKE SURE YOU:   Understand these instructions.  Will watch your condition.  Will get help right away if you are not doing well or get worse. Document Released: 06/10/2005 Document Revised: 09/02/2011 Document Reviewed: 11/07/2010 [facility] Patient Information 2015 [place], [place]. This information is not  intended to replace advice given to you by your health care provider. Make sure you discuss any questions you have with your health care provider.   Colitis Colitis is inflammation of the colon. Colitis can be a short-term or long-standing (chronic) illness. Crohn's disease and ulcerative colitis are 2 types of colitis which are chronic. They usually require lifelong treatment. CAUSES  There are many different causes of colitis, including:  Viruses.  Germs (bacteria).  Medicine reactions. SYMPTOMS   Diarrhea.  Intestinal bleeding.  Pain.  Fever.  Throwing up (vomiting).  Tiredness (fatigue).  Weight loss.  Bowel blockage. DIAGNOSIS  The diagnosis of colitis is based on examination and stool or blood tests. X-rays, CT scan, and colonoscopy may also be needed. TREATMENT  Treatment may include:  Fluids given through the vein (intravenously).  Bowel rest (nothing to eat or drink for a period of time).  Medicine for pain and diarrhea.  Medicines (antibiotics) that kill germs.  Cortisone medicines.  Surgery. HOME CARE INSTRUCTIONS   Get plenty of rest.  Drink enough water and fluids to keep your urine clear or pale yellow.  Eat a well-balanced diet.  Call your caregiver for follow-up as recommended. SEEK IMMEDIATE MEDICAL CARE IF:   You develop chills.  You have an oral temperature above 102 F (38.9 C), not  controlled by medicine.  You have extreme weakness, fainting, or dehydration.  You have repeated vomiting.  You develop severe belly (abdominal) pain or are passing bloody or tarry stools. MAKE SURE YOU:   Understand these instructions.  Will watch your condition.  Will get help right away if you are not doing well or get worse. Document Released: 07/18/2004 Document Revised: 09/02/2011 Document Reviewed: 10/13/2009 [facility] Patient Information 2015 [place], [place]. This information is not intended to replace advice given to you by your health  care provider. Make sure you discuss any questions you have with your health care provider.

## 2015-01-24 ENCOUNTER — Ambulatory Visit: Payer: Self-pay | Admitting: Podiatry

## 2015-03-13 ENCOUNTER — Encounter (HOSPITAL_COMMUNITY): Payer: Self-pay | Admitting: Emergency Medicine

## 2015-03-13 ENCOUNTER — Emergency Department (HOSPITAL_COMMUNITY)
Admission: EM | Admit: 2015-03-13 | Discharge: 2015-03-13 | Disposition: A | Discharge status: Home / Self Care | Payer: Medicare Other | Attending: Emergency Medicine | Admitting: Emergency Medicine

## 2015-03-13 DIAGNOSIS — R531 Weakness: Secondary | ICD-10-CM | POA: Insufficient documentation

## 2015-03-13 DIAGNOSIS — Z79899 Other long term (current) drug therapy: Secondary | ICD-10-CM | POA: Diagnosis not present

## 2015-03-13 DIAGNOSIS — E782 Mixed hyperlipidemia: Secondary | ICD-10-CM | POA: Insufficient documentation

## 2015-03-13 DIAGNOSIS — K219 Gastro-esophageal reflux disease without esophagitis: Secondary | ICD-10-CM | POA: Insufficient documentation

## 2015-03-13 DIAGNOSIS — R4182 Altered mental status, unspecified: Secondary | ICD-10-CM

## 2015-03-13 DIAGNOSIS — Z7982 Long term (current) use of aspirin: Secondary | ICD-10-CM | POA: Insufficient documentation

## 2015-03-13 DIAGNOSIS — Z86010 Personal history of colonic polyps: Secondary | ICD-10-CM | POA: Insufficient documentation

## 2015-03-13 DIAGNOSIS — I10 Essential (primary) hypertension: Secondary | ICD-10-CM | POA: Insufficient documentation

## 2015-03-13 DIAGNOSIS — Z87440 Personal history of urinary (tract) infections: Secondary | ICD-10-CM | POA: Diagnosis not present

## 2015-03-13 LAB — BASIC METABOLIC PANEL
ANION GAP: 7 (ref 5–15)
BUN: 18 mg/dL (ref 6–20)
CHLORIDE: 106 mmol/L (ref 101–111)
CO2: 27 mmol/L (ref 22–32)
Calcium: 9.6 mg/dL (ref 8.9–10.3)
Creatinine, Ser: 0.64 mg/dL (ref 0.44–1.00)
Glucose, Bld: 82 mg/dL (ref 65–99)
POTASSIUM: 4.2 mmol/L (ref 3.5–5.1)
SODIUM: 140 mmol/L (ref 135–145)

## 2015-03-13 LAB — URINALYSIS, ROUTINE W REFLEX MICROSCOPIC
Bilirubin Urine: NEGATIVE
Glucose, UA: NEGATIVE mg/dL
HGB URINE DIPSTICK: NEGATIVE
KETONES UR: NEGATIVE mg/dL
LEUKOCYTES UA: NEGATIVE
Nitrite: NEGATIVE
PROTEIN: NEGATIVE mg/dL
Specific Gravity, Urine: 1.005 (ref 1.005–1.030)
UROBILINOGEN UA: 0.2 mg/dL (ref 0.0–1.0)
pH: 8 (ref 5.0–8.0)

## 2015-03-13 LAB — CBC WITH DIFFERENTIAL/PLATELET
BASOS ABS: 0 10*3/uL (ref 0.0–0.1)
BASOS PCT: 1 %
EOS PCT: 2 %
Eosinophils Absolute: 0.1 10*3/uL (ref 0.0–0.7)
HCT: 40 % (ref 36.0–46.0)
Hemoglobin: 13 g/dL (ref 12.0–15.0)
LYMPHS PCT: 47 %
Lymphs Abs: 2.1 10*3/uL (ref 0.7–4.0)
MCH: 30.9 pg (ref 26.0–34.0)
MCHC: 32.5 g/dL (ref 30.0–36.0)
MCV: 95 fL (ref 78.0–100.0)
Monocytes Absolute: 0.4 10*3/uL (ref 0.1–1.0)
Monocytes Relative: 8 %
NEUTROS ABS: 1.9 10*3/uL (ref 1.7–7.7)
Neutrophils Relative %: 42 %
PLATELETS: 191 10*3/uL (ref 150–400)
RBC: 4.21 MIL/uL (ref 3.87–5.11)
RDW: 13.3 % (ref 11.5–15.5)
WBC: 4.5 10*3/uL (ref 4.0–10.5)

## 2015-03-13 LAB — CBG MONITORING, ED: Glucose-Capillary: 53 mg/dL — ABNORMAL LOW (ref 65–99)

## 2015-03-13 MED ORDER — MIRTAZAPINE 15 MG PO TABS
15.0000 mg | ORAL_TABLET | Freq: Every day | ORAL | Status: DC
  Filled 2015-03-13: qty 1

## 2015-03-13 MED ORDER — HYDROCHLOROTHIAZIDE 25 MG PO TABS
25.0000 mg | ORAL_TABLET | Freq: Every day | ORAL | Status: DC
  Administered 2015-03-13: 25 mg via ORAL
  Filled 2015-03-13: qty 1

## 2015-03-13 MED ORDER — ONDANSETRON HCL 4 MG PO TABS: 4.0000 mg | ORAL_TABLET | Freq: Three times a day (TID) | ORAL | Status: DC | PRN

## 2015-03-13 MED ORDER — ZOLPIDEM TARTRATE 5 MG PO TABS: 5.0000 mg | ORAL_TABLET | Freq: Every evening | ORAL | Status: DC | PRN

## 2015-03-13 MED ORDER — IBUPROFEN 400 MG PO TABS: 600.0000 mg | ORAL_TABLET | Freq: Three times a day (TID) | ORAL | Status: DC | PRN

## 2015-03-13 MED ORDER — ALUM & MAG HYDROXIDE-SIMETH 200-200-20 MG/5ML PO SUSP: 30.0000 mL | ORAL | Status: DC | PRN

## 2015-03-13 MED ORDER — NICOTINE 21 MG/24HR TD PT24: 21.0000 mg | MEDICATED_PATCH | Freq: Every day | TRANSDERMAL | Status: DC | PRN

## 2015-03-13 MED ORDER — LOSARTAN POTASSIUM 25 MG PO TABS
25.0000 mg | ORAL_TABLET | Freq: Every day | ORAL | Status: DC
  Filled 2015-03-13: qty 1

## 2015-03-13 MED ORDER — ALBUTEROL SULFATE HFA 108 (90 BASE) MCG/ACT IN AERS: 2.0000 | INHALATION_SPRAY | Freq: Four times a day (QID) | RESPIRATORY_TRACT | Status: DC | PRN

## 2015-03-13 MED ORDER — ACETAMINOPHEN 325 MG PO TABS: 650.0000 mg | ORAL_TABLET | ORAL | Status: DC | PRN

## 2015-03-13 MED ORDER — METOPROLOL SUCCINATE ER 25 MG PO TB24
25.0000 mg | ORAL_TABLET | Freq: Every day | ORAL | Status: DC
  Administered 2015-03-13: 25 mg via ORAL
  Filled 2015-03-13: qty 1

## 2015-03-13 NOTE — ED Notes (Signed)
Placed patient on bedpan

## 2015-03-13 NOTE — ED Notes (Signed)
Took patient off bedpan. 

## 2015-03-13 NOTE — BH Assessment (Signed)
[facility] Assessment Progress Note    Per Mickel Baas, DNP - Patient does not meet criteria for inpatient hospitalization. Writer informed the patients son and the nurse.  Writer was unsuccessful in reaching the ER MD.

## 2015-03-13 NOTE — ED Provider Notes (Signed)
After completing evaluation by social work and [employer], the patient did not meet inpatient criteria. I did reassess the patient. She is alert and interactive. Her speech is clear. She is not acutely showing signs of confusion or toxicity. She is a well-nourished well-developed. At this time her patient's son does plan to take her home.  Charlesetta Shanks, MD 03/13/15 [phone]

## 2015-03-13 NOTE — ED Notes (Signed)
Patient taken off of bedpan; patient resting, no needs at this time

## 2015-03-13 NOTE — Discharge Instructions (Signed)
Altered Mental Status °Altered mental status most often refers to an abnormal change in your responsiveness and awareness. It can affect your speech, thought, mobility, memory, attention span, or alertness. It can range from slight confusion to complete unresponsiveness (coma). Altered mental status can be a sign of a serious underlying medical condition. Rapid evaluation and medical treatment is necessary for patients having an altered mental status. °CAUSES  °· Low blood sugar (hypoglycemia) or diabetes. °· Severe loss of body fluids (dehydration) or a body salt (electrolyte) imbalance. °· A stroke or other neurologic problem, such as dementia or delirium. °· A head injury or tumor. °· A drug or alcohol overdose. °· Exposure to toxins or poisons. °· Depression, anxiety, and stress. °· A low oxygen level (hypoxia). °· An infection. °· Blood loss. °· Twitching or shaking (seizure). °· Heart problems, such as heart attack or heart rhythm problems (arrhythmias). °· A body temperature that is too low or too high (hypothermia or hyperthermia). °DIAGNOSIS  °A diagnosis is based on your history, symptoms, physical and neurologic examinations, and diagnostic tests. Diagnostic tests may include: °· Measurement of your blood pressure, pulse, breathing, and oxygen levels (vital signs). °· Blood tests. °· Urine tests. °· X-ray exams. °· A computerized magnetic scan (magnetic resonance imaging, MRI). °· A computerized X-ray scan (computed tomography, CT scan). °TREATMENT  °Treatment will depend on the cause. Treatment may include: °· Management of an underlying medical or mental health condition. °· Critical care or support in the hospital. °HOME CARE INSTRUCTIONS  °· Only take over-the-counter or prescription medicines for pain, discomfort, or fever as directed by your caregiver. °· Manage underlying conditions as directed by your caregiver. °· Eat a healthy, well-balanced diet to maintain strength. °· Join a support group or  prevention program to cope with the condition or trauma that caused the altered mental status. Ask your caregiver to help choose a program that works for you. °· Follow up with your caregiver for further examination, therapy, or testing as directed. °SEEK MEDICAL CARE IF:  °· You feel unwell or have chills. °· You or your family notice a change in your behavior or your alertness. °· You have trouble following your caregiver's treatment plan. °· You have questions or concerns. °SEEK IMMEDIATE MEDICAL CARE IF:  °· You have a rapid heartbeat or have chest pain. °· You have difficulty breathing. °· You have a fever. °· You have a headache with a stiff neck. °· You cough up blood. °· You have blood in your urine or stool. °· You have severe agitation or confusion. °MAKE SURE YOU:  °· Understand these instructions. °· Will watch your condition. °· Will get help right away if you are not doing well or get worse. °Document Released: 11/28/2009 Document Revised: 09/02/2011 Document Reviewed: 11/28/2009 °ExitCare® Patient Information ©2015 [employer]. This information is not intended to replace advice given to you by your health care provider. Make sure you discuss any questions you have with your health care provider. ° °Weakness °Weakness is a lack of strength. It may be felt all over the body (generalized) or in one specific part of the body (focal). Some causes of weakness can be serious. You may need further medical evaluation, especially if you are elderly or you have a history of immunosuppression (such as chemotherapy or HIV), kidney disease, heart disease, or diabetes. °CAUSES  °Weakness can be caused by many different things, including: °· Infection. °· Physical exhaustion. °· Internal bleeding or other blood loss that results   in a lack of red blood cells (anemia). °· Dehydration. This cause is more common in elderly people. °· Side effects or electrolyte abnormalities from medicines, such as pain medicines or  sedatives. °· Emotional distress, anxiety, or depression. °· Circulation problems, especially severe peripheral arterial disease. °· Heart disease, such as rapid atrial fibrillation, bradycardia, or heart failure. °· Nervous system disorders, such as Guillain-Barré syndrome, multiple sclerosis, or stroke. °DIAGNOSIS  °To find the cause of your weakness, your caregiver will take your history and perform a physical exam. Lab tests or X-rays may also be ordered, if needed. °TREATMENT  °Treatment of weakness depends on the cause of your symptoms and can vary greatly. °HOME CARE INSTRUCTIONS  °· Rest as needed. °· Eat a well-balanced diet. °· Try to get some exercise every day. °· Only take over-the-counter or prescription medicines as directed by your caregiver. °SEEK MEDICAL CARE IF:  °· Your weakness seems to be getting worse or spreads to other parts of your body. °· You develop new aches or pains. °SEEK IMMEDIATE MEDICAL CARE IF:  °· You cannot perform your normal daily activities, such as getting dressed and feeding yourself. °· You cannot walk up and down stairs, or you feel exhausted when you do so. °· You have shortness of breath or chest pain. °· You have difficulty moving parts of your body. °· You have weakness in only one area of the body or on only one side of the body. °· You have a fever. °· You have trouble speaking or swallowing. °· You cannot control your bladder or bowel movements. °· You have black or bloody vomit or stools. °MAKE SURE YOU: °· Understand these instructions. °· Will watch your condition. °· Will get help right away if you are not doing well or get worse. °Document Released: 06/10/2005 Document Revised: 12/10/2011 Document Reviewed: 08/09/2011 °ExitCare® Patient Information ©2015 [employer]. This information is not intended to replace advice given to you by your health care provider. Make sure you discuss any questions you have with your health care provider. ° °

## 2015-03-13 NOTE — ED Notes (Signed)
[place] pt son and power of attorney.

## 2015-03-13 NOTE — BH Assessment (Addendum)
Tele Assessment Note   Kathryn Whitaker is an 81 y.o. female that was brought to the ED from [facility].  Patient denies SI/HI/Psychosis/Substance Abuse.  Documentation in the epic chart reports that [place] facility reports that the patient is paranoid and believes that the facility is doing something to her.  Patient reports that she does not think that they are doing anything to her.  Patient reports that she has a lot of food allergies and she does not think that the facility is aware that some food will make her ill.   Documentation in the epic chart reports that [facility] facility reports that the patient stated that "I'm gonna let God handle it. I'm not gonna tell yall what they're doing. It'll all come out when I die."  Patient denied making this statement.  Patient reports that, "she wants to live".  Patient reports that she has a supportive son who is at the ER with her right now.     Writer received collateral information from the patients son and the facility.  Per the patients son the patient has never had any type of prior mental illness.  Her son reports that his mother has never taken medication for depression.  She has never had any psychiatric hospitalizations.  His mother has never attempted suicide in the past.  [facility] facility reports that the patient has been at their facility for over a month and she was able was and communicate to everyone.  The facility report the patient has never displayed any psychosis or depression for the month that she has been at this facility.   Per Mickel Baas, DNP - Patient does not meet criteria for inpatient hospitalization.      Axis I: Depressive Disorder NOS Axis II: Deferred Axis III:  Past Medical History  Diagnosis Date  . Allergic rhinitis   . Acute asthmatic bronchitis   . Hypertension   . Hypercholesteremia   . GERD (gastroesophageal reflux disease)   .  Diverticulosis of colon   . IBS (irritable bowel syndrome)   . History of colonic polyps   . UTI (lower urinary tract infection)   . DJD (degenerative joint disease)   . Lumbar back pain   . Lacunar infarction   . Vitamin D deficiency   . Anxiety    Axis IV: other psychosocial or environmental problems, problems related to social environment and problems with access to health care services Axis V: 51-60 moderate symptoms  Past Medical History:  Past Medical History  Diagnosis Date  . Allergic rhinitis   . Acute asthmatic bronchitis   . Hypertension   . Hypercholesteremia   . GERD (gastroesophageal reflux disease)   . Diverticulosis of colon   . IBS (irritable bowel syndrome)   . History of colonic polyps   . UTI (lower urinary tract infection)   . DJD (degenerative joint disease)   . Lumbar back pain   . Lacunar infarction   . Vitamin D deficiency   . Anxiety     Past Surgical History  Procedure Laterality Date  . Abdominal hysterectomy      Family History: No family history on file.  Social History:  reports that she has never smoked. She has never used smokeless tobacco. She reports that she does not drink alcohol or use illicit drugs.  Additional Social History:  Alcohol / Drug Use History of alcohol / drug use?: No history of alcohol / drug abuse  CIWA: CIWA-Ar BP: 181/99 mmHg Pulse Rate: 74 COWS:    PATIENT STRENGTHS: (choose at least two) Communication skills Supportive family/friends  Allergies:  Allergies  Allergen Reactions  . Demerol [Meperidine]     Pt states she can't remember type of reaction  . Influenza Vaccines     Pt can't remember reaction but states allergic and has not had in 25 years  . Peanuts [Peanut Oil]     Pt reports she is allergic to peanuts, but was asking to eat peanut butter. Pt unsure of her reaction.   [name] [Amoxicill-Clarithro-Lansopraz] Hives  . Telithromycin Hives    REACTION: pt states HIVES....    Home  Medications:  (Not in a hospital admission)  OB/GYN Status:  No LMP recorded. Patient has had a hysterectomy.  General Assessment Data Location of Assessment: [facility] ED TTS Assessment: In system Is this a Tele or Face-to-Face Assessment?: Tele Assessment Is this an Initial Assessment or a Re-assessment for this encounter?: Initial Assessment Marital status: Widowed Is patient pregnant?: No Pregnancy Status: No Living Arrangements:  ([place] ) Can pt return to current living arrangement?: Yes Admission Status: Voluntary Is patient capable of signing voluntary admission?: Yes Referral Source: Self/Family/Friend Insurance type: Insurance risk surveyor Exam ([place]) Medical Exam completed: Yes  Crisis Care Plan Living Arrangements:  ([place] ) Name of Psychiatrist: None Reported Name of Therapist: None Reported  Education Status Is patient currently in school?: No Current Grade: NA Highest grade of school patient has completed: NA Name of school: NA Contact person: NA  Risk to self with the past 6 months Suicidal Ideation: No Has patient been a risk to self within the past 6 months prior to admission? : No Suicidal Intent: No Has patient had any suicidal intent within the past 6 months prior to admission? : No Is patient at risk for suicide?: No Suicidal Plan?: No Has patient had any suicidal plan within the past 6 months prior to admission? : No Access to Means: No What has been your use of drugs/alcohol within the last 12 months?: None Reported Previous Attempts/Gestures: No How many times?: 0 Other Self Harm Risks: None Reported Triggers for Past Attempts:  (NA) Intentional Self Injurious Behavior: None Family Suicide History: No Recent stressful life event(s): Other (Comment) (None Reported) Persecutory voices/beliefs?: No Depression: No Depression Symptoms:  (None Reported) Substance  abuse history and/or treatment for substance abuse?: No Suicide prevention information given to non-admitted patients: Not applicable  Risk to Others within the past 6 months Homicidal Ideation: No Does patient have any lifetime risk of violence toward others beyond the six months prior to admission? : No Thoughts of Harm to Others: No Current Homicidal Intent: No Current Homicidal Plan: No Access to Homicidal Means: No Identified Victim: None Reported History of harm to others?: No Assessment of Violence: None Noted Violent Behavior Description: None Reportedc Does patient have access to weapons?: No Criminal Charges Pending?: No Does patient have a court date: No Is patient on probation?: No  Psychosis Hallucinations: None noted Delusions: None noted  Mental Status Report Appearance/Hygiene: In hospital gown Eye Contact: Fair Motor Activity: Freedom of movement Speech: Logical/coherent Level of Consciousness: Alert Mood: Suspicious Affect: Appropriate to circumstance Anxiety Level: None Thought Processes: Coherent, Relevant Judgement: Unimpaired Orientation: Person, Place, Time, Situation Obsessive Compulsive Thoughts/Behaviors: None  Cognitive Functioning Concentration: Decreased Memory: Recent Intact, Remote Intact IQ: Average Insight: Fair Impulse Control: Good Appetite: Fair Weight  Loss: 0 Weight Gain: 0 Sleep: No Change Total Hours of Sleep: 9 Vegetative Symptoms: None  ADLScreening [facility] Assessment Services) Patient's cognitive ability adequate to safely complete daily activities?: No Patient able to express need for assistance with ADLs?: Yes Independently performs ADLs?: No  Prior Inpatient Therapy Prior Inpatient Therapy: No Prior Therapy Dates: NA Prior Therapy Facilty/Provider(s): NA Reason for Treatment: NA  Prior Outpatient Therapy Prior Outpatient Therapy: No Prior Therapy Dates: NA Prior Therapy Facilty/Provider(s): NA Reason for  Treatment: NA Does patient have an ACCT team?: No Does patient have Intensive In-House Services?  : No Does patient have [facility] services? : No Does patient have P4CC services?: No  ADL Screening (condition at time of admission) Patient's cognitive ability adequate to safely complete daily activities?: No Is the patient deaf or have difficulty hearing?: No Does the patient have difficulty seeing, even when wearing glasses/contacts?: Yes Does the patient have difficulty concentrating, remembering, or making decisions?: Yes Patient able to express need for assistance with ADLs?: Yes Does the patient have difficulty dressing or bathing?: Yes Independently performs ADLs?: No Communication: Independent Dressing (OT): Needs assistance Grooming: Needs assistance Feeding: Independent Bathing: Needs assistance Toileting: Needs assistance In/Out Bed: Needs assistance Walks in Home: Needs assistance Does the patient have difficulty walking or climbing stairs?: Yes Weakness of Legs: None Weakness of Arms/Hands: None  Home Assistive Devices/Equipment Home Assistive Devices/Equipment: Wheelchair    Abuse/Neglect Assessment (Assessment to be complete while patient is alone) Physical Abuse: Denies Verbal Abuse: Denies Sexual Abuse: Denies Exploitation of patient/patient's resources: Denies Self-Neglect: Denies Values / Beliefs Cultural Requests During Hospitalization: None Spiritual Requests During Hospitalization: None Consults Spiritual Care Consult Needed: No Social Work Consult Needed: No Regulatory affairs officer (For Healthcare) Does patient have an advance directive?: No Would patient like information on creating an advanced directive?: No - patient declined information Type of Advance Directive: [place] Wyvonnia Dusky [place])    Additional Information 1:1 In Past 12 Months?: No CIRT Risk: No Elopement Risk: No Does patient have medical clearance?: Yes      Disposition: Per Mickel Baas, DNP patient does not meet criteria for inpatient hospitalization.   Disposition Initial Assessment Completed for this Encounter: Yes Disposition of Patient: Other dispositions (Per Mickel Baas - patient does not meet criteria for inpatient hos) Other disposition(s): Other (Comment)  Johnnye Sima, Ava LaVerne 03/13/2015 6:00 PM

## 2015-03-13 NOTE — ED Notes (Addendum)
Per EMS- Pt here from [facility] assisted [facility]. Staff reported they were walking the patient and she was stumbling and weak. Staff reports pt confused, but pt does not appear confused and can answer questions appropriately.  Pt reports that ever since she moved into this facility she has lost weight and she has "spells" where she gets a rash and cannot finish eating her food. Pt reports she is feeling better but did not feel well at breakfast. Pt reports she gets weak when she eats.  Pt brought her purse and medications to the bedside. Pt has not taken her BP meds today.  Pt very paranoid that her facility is doing something to her but keeps stating "I'm gonna let God handle it. I'm not gonna tell yall what they're doing. It'll all come out when I die."

## 2015-03-13 NOTE — ED Notes (Signed)
Patient placed on bedpan.

## 2015-03-13 NOTE — ED Provider Notes (Signed)
CSN: [id number]     Arrival date & time 03/13/15  1234 History   First MD Initiated Contact with Patient 03/13/15 1259     Chief Complaint  Patient presents with  . Weakness     (Consider location/radiation/quality/duration/timing/severity/associated sxs/prior Treatment) Patient is a 81 y.o. female presenting with weakness. The history is provided by the patient and a relative.  Weakness   Kathryn Whitaker is a 81 y.o. female para was sent here after walking with physical therapist at her facility and being weaker than usual. Patient recalls this incident, but cannot give much additional information. Patient remembers losing some weight but states that she is eating everything she can get her hands on. She denies any other illnesses.    Past Medical History  Diagnosis Date  . Allergic rhinitis   . Acute asthmatic bronchitis   . Hypertension   . Hypercholesteremia   . GERD (gastroesophageal reflux disease)   . Diverticulosis of colon   . IBS (irritable bowel syndrome)   . History of colonic polyps   . UTI (lower urinary tract infection)   . DJD (degenerative joint disease)   . Lumbar back pain   . Lacunar infarction   . Vitamin D deficiency   . Anxiety    Past Surgical History  Procedure Laterality Date  . Abdominal hysterectomy     No family history on file. Social History  Substance Use Topics  . Smoking status: Never Smoker   . Smokeless tobacco: Never Used  . Alcohol Use: No   OB History    No data available     Review of Systems  Neurological: Positive for weakness.  All other systems reviewed and are negative.     Allergies  Demerol; Influenza vaccines; Prevpac; and Telithromycin  Home Medications   Prior to Admission medications   Medication Sig Start Date End Date Taking? Authorizing Provider  acetaminophen (TYLENOL) 325 MG tablet Take 2 tablets (650 mg total) by mouth every 6 (six) hours as needed. Patient not taking: Reported on 01/23/2015  12/14/12   Venetia Maxon Rama, MD  albuterol (PROVENTIL HFA) 108 (90 BASE) MCG/ACT inhaler Inhale 2 puffs into the lungs every 6 (six) hours as needed. For shortness of breath 06/27/14   Delfina Redwood, MD  ALPRAZolam Duanne Moron) 0.25 MG tablet Take 0.25 mg by mouth 3 (three) times daily as needed for anxiety.    Historical Provider, MD  amLODipine (NORVASC) 2.5 MG tablet Take 2.5 mg by mouth daily.    Historical Provider, MD  aspirin EC 81 MG tablet Take 81 mg by mouth daily.    Historical Provider, MD  atorvastatin (LIPITOR) 80 MG tablet TAKE 1 TABLET BY MOUTH DAILY 06/02/14   Noralee Space, MD  budesonide-formoterol [facility]) 80-4.5 MCG/ACT inhaler Inhale 2 puffs into the lungs 2 (two) times daily as needed. For shortness of breath 06/27/14   Delfina Redwood, MD  Cholecalciferol (VITAMIN D) 1000 UNITS capsule Take 1,000 Units by mouth daily.      Historical Provider, MD  ciprofloxacin (CIPRO) 500 MG tablet Take 1 tablet (500 mg total) by mouth 2 (two) times daily. One po bid x 7 days 01/23/15   Sherwood Gambler, MD  docusate sodium (COLACE) 100 MG capsule Take 1 capsule (100 mg total) by mouth every 12 (twelve) hours. Patient not taking: Reported on 01/23/2015 11/24/13   Sherwood Gambler, MD  emollient (BIAFINE) cream Apply topically daily. Patient not taking: Reported on 01/23/2015 03/31/14   Real Cons  Doug Sou, MD  fish oil-omega-3 fatty acids 1000 MG capsule Take 1 g by mouth 3 (three) times daily.     Historical Provider, MD  losartan (COZAAR) 25 MG tablet Take 1 tablet (25 mg total) by mouth daily. Patient not taking: Reported on 01/23/2015 06/27/14   Delfina Redwood, MD  metoprolol succinate (TOPROL XL) 25 MG 24 hr tablet Take 1 tablet (25 mg total) by mouth daily. Patient not taking: Reported on 01/23/2015 06/27/14   Delfina Redwood, MD  metroNIDAZOLE (FLAGYL) 500 MG tablet Take 1 tablet (500 mg total) by mouth 2 (two) times daily. One po bid x 7 days 01/23/15   Sherwood Gambler, MD  mirtazapine (REMERON) 15  MG tablet Take 1 tablet (15 mg total) by mouth at bedtime. 06/27/14   Delfina Redwood, MD  omeprazole (PRILOSEC) 20 MG capsule Take 1 capsule (20 mg total) by mouth daily. 06/27/14   Delfina Redwood, MD  psyllium (METAMUCIL) 58.6 % packet Take 1 packet by mouth 3 (three) times daily.     Historical Provider, MD  urea (CARMOL) 20 % cream Apply 1 application topically as needed (for itching).    Historical Provider, MD  white petrolatum (VASELINE) GEL Apply 1 application topically daily as needed for dry skin (arms, legs, feet.).    Historical Provider, MD   BP 175/96 mmHg  Pulse 73  Temp(Src) 97.8 F (36.6 C) (Rectal)  Resp 21  SpO2 100% Physical Exam  Constitutional: She appears well-developed.  Elderly, frail  HENT:  Head: Normocephalic and atraumatic.  Right Ear: External ear normal.  Left Ear: External ear normal.  Eyes: Conjunctivae and EOM are normal. Pupils are equal, round, and reactive to light.  Neck: Normal range of motion and phonation normal. Neck supple.  Cardiovascular: Normal rate, regular rhythm and normal heart sounds.   Pulmonary/Chest: Effort normal and breath sounds normal. She exhibits no bony tenderness.  Abdominal: Soft. There is no tenderness.  Musculoskeletal: Normal range of motion.  Neurological: She is alert. No cranial nerve deficit or sensory deficit. She exhibits normal muscle tone. Coordination normal.  She is able to sit and hold herself in the seated position, without support. No dysarthria or nystagmus.  Skin: Skin is warm, dry and intact.  Psychiatric: She has a normal mood and affect. Her behavior is normal.  Nursing note and vitals reviewed.   ED Course  Procedures (including critical care time)  Medications - No data to display  Patient Vitals for the past 24 hrs:  BP Temp Temp src Pulse Resp SpO2  03/13/15 1300 175/96 mmHg - - 73 21 100 %  03/13/15 1248 (!) 201/99 mmHg 97.8 F (36.6 C) Rectal 62 15 100 %    4:18 PM Reevaluation  with update and discussion. After initial assessment and treatment, an updated evaluation reveals she continues to be alert, and occasionally demonstrates a suspicious/paranoid behavior. The patient's son has now arrived from [place], and gives additional information which was not available previously. He states that she was placed into assisted living, 3 weeks ago where she manages her own medications, and is getting physical therapy. He believes she is not taking her usual medications, and is at risk for falling because of a gait problem. Prior to that she had lived with him in [place], for 2 or 3 weeks. Before that she was living alone in her home in [place]. He last saw her 3 days ago and feels that she is at her baseline today, compared to  that day. I discussed the case with social services and case manager and they will evaluate the patient for placement. In the meantime, the patient may benefit from a psychiatric evaluation. WENTZ,ELLIOTT L   17:20- case discussed with TTS, they will evaluate the patient for psychiatric needs, and consider placement to [facility] psychiatry hospital  Labs Review Labs Reviewed  CBG MONITORING, ED - Abnormal; Notable for the following:    Glucose-Capillary 53 (*)    All other components within normal limits  BASIC METABOLIC PANEL  URINALYSIS, ROUTINE W REFLEX MICROSCOPIC (NOT AT [facility])  CBC WITH DIFFERENTIAL/PLATELET  CBG MONITORING, ED    Imaging Review No results found. I have personally reviewed and evaluated these images and lab results as part of my medical decision-making.   EKG Interpretation   Date/Time:  Monday March 13 2015 12:41:15 EDT Ventricular Rate:  53 PR Interval:  189 QRS Duration: 80 QT Interval:  432 QTC Calculation: 406 R Axis:   -14 Text Interpretation:  Sinus arrhythmia Anterior infarct, old since last  tracing no significant change Confirmed by Eulis Foster  MD, ELLIOTT [phone]) on  03/13/2015 1:00:47 PM       MDM   Final diagnoses:  Altered mental status, unspecified altered mental status type  Weakness    Patient with weakness, and progressive agitation with suspiciousness and paranoia. She is currently living in assisted living facility, and will likely need additional, more advanced care either in a psychiatry hospital, or a SNF/Rehab.  Nursing Notes Reviewed/ Care Coordinated, and agree without changes. Applicable Imaging Reviewed.  Interpretation of Laboratory Data incorporated into ED treatment  Plan: As per TTS, in conjunction with oncoming provider team.    Daleen Bo, MD 03/13/15 2059

## 2015-03-13 NOTE — Progress Notes (Signed)
CSW provided pt's son with ALF list and discussed applying for Medicaid Special Assistance on her behalf.Son is taking pt to him home this pm and will follow up with DSS in the am.  He is unsure of pt's income and assets at this time.  CSW offered to look for ALF bed for tomorrow, but he declined due to pt's finances/ability to private pay until Medicaid SA is explored/approved.

## 2015-03-13 NOTE — ED Notes (Signed)
Patient resting on stretcher, no needs at this time 

## 2015-04-06 ENCOUNTER — Ambulatory Visit (INDEPENDENT_AMBULATORY_CARE_PROVIDER_SITE_OTHER): Payer: Medicare Other | Admitting: Internal Medicine

## 2015-04-06 ENCOUNTER — Encounter: Payer: Self-pay | Admitting: Internal Medicine

## 2015-04-06 ENCOUNTER — Other Ambulatory Visit (INDEPENDENT_AMBULATORY_CARE_PROVIDER_SITE_OTHER): Payer: Medicare Other

## 2015-04-06 VITALS — BP 132/78 | HR 53 | Temp 97.9°F | Resp 12 | Ht 63.0 in | Wt 106.0 lb

## 2015-04-06 DIAGNOSIS — E785 Hyperlipidemia, unspecified: Secondary | ICD-10-CM | POA: Diagnosis not present

## 2015-04-06 DIAGNOSIS — F03918 Unspecified dementia, unspecified severity, with other behavioral disturbance: Secondary | ICD-10-CM

## 2015-04-06 DIAGNOSIS — F0391 Unspecified dementia with behavioral disturbance: Secondary | ICD-10-CM | POA: Diagnosis not present

## 2015-04-06 DIAGNOSIS — R350 Frequency of micturition: Secondary | ICD-10-CM | POA: Diagnosis not present

## 2015-04-06 DIAGNOSIS — Z8673 Personal history of transient ischemic attack (TIA), and cerebral infarction without residual deficits: Secondary | ICD-10-CM | POA: Diagnosis not present

## 2015-04-06 DIAGNOSIS — I10 Essential (primary) hypertension: Secondary | ICD-10-CM

## 2015-04-06 DIAGNOSIS — E43 Unspecified severe protein-calorie malnutrition: Secondary | ICD-10-CM

## 2015-04-06 DIAGNOSIS — J42 Unspecified chronic bronchitis: Secondary | ICD-10-CM

## 2015-04-06 LAB — POCT URINALYSIS DIPSTICK
BILIRUBIN UA: NEGATIVE
Blood, UA: NEGATIVE
Glucose, UA: NEGATIVE
KETONES UA: NEGATIVE
LEUKOCYTES UA: NEGATIVE
Nitrite, UA: NEGATIVE
PH UA: 6
Protein, UA: NEGATIVE
SPEC GRAV UA: 1.015
Urobilinogen, UA: NEGATIVE

## 2015-04-06 LAB — LIPID PANEL
Cholesterol: 245 mg/dL — ABNORMAL HIGH (ref 0–200)
HDL: 60.2 mg/dL (ref >39.00)
LDL Cholesterol: 168 mg/dL — ABNORMAL HIGH (ref 0–99)
NONHDL: 184.61
TRIGLYCERIDES: 81 mg/dL (ref 0.0–149.0)
Total CHOL/HDL Ratio: 4
VLDL: 16.2 mg/dL (ref 0.0–40.0)

## 2015-04-06 LAB — COMPREHENSIVE METABOLIC PANEL
ALBUMIN: 4 g/dL (ref 3.5–5.2)
ALK PHOS: 81 U/L (ref 39–117)
ALT: 10 U/L (ref 0–35)
AST: 17 U/L (ref 0–37)
BILIRUBIN TOTAL: 0.9 mg/dL (ref 0.2–1.2)
BUN: 14 mg/dL (ref 6–23)
CO2: 30 mEq/L (ref 19–32)
CREATININE: 0.62 mg/dL (ref 0.40–1.20)
Calcium: 9.9 mg/dL (ref 8.4–10.5)
Chloride: 105 mEq/L (ref 96–112)
GFR: 115.39 mL/min (ref >60.00)
GLUCOSE: 79 mg/dL (ref 70–99)
POTASSIUM: 4.3 meq/L (ref 3.5–5.1)
SODIUM: 142 meq/L (ref 135–145)
TOTAL PROTEIN: 7.4 g/dL (ref 6.0–8.3)

## 2015-04-06 NOTE — Patient Instructions (Addendum)
We would like for you to get some therapy at your home to help you to get strong and to be able to do as much as you can.   We are checking labs today and will call back about what medicines to take. Until then you should be taking:  1. Metoprolol 1 pill daily.   2. Losartan 1 pill daily.   3. Aspirin 81 mg daily.  4. You can use tylenol for pain. Do NOT take Ibuprofen or Advil or Motrin for the pain as these are bad for the stomach.   You can eat whatever you want and do not need to be on low salt or low sugar diet. When you are doing therapy it will be a good idea to do some boost or ensure to help out to build muscle.   Come back in about 3 months so we can see how you are doing.

## 2015-04-06 NOTE — Progress Notes (Signed)
Pre visit review using our clinic review tool, if applicable. No additional management support is needed unless otherwise documented below in the visit note. 

## 2015-04-09 NOTE — Progress Notes (Signed)
   Subjective:    Patient ID: Kathryn Whitaker, female    DOB: 1920/02/20, 81 y.o.   MRN: [id number]  HPI The patient is a 81 YO female coming in with her son to talk about the prospect of going into a nursing home. Previously she was living in assisted living but recently had to move in with her son as she was not able to manage. She had UTI and has in the ER. She did not recover as well as she would like. Her energy is not back and she is not able to do much around the house. Her son feels that her memory is severely poor and she does not remember to eat and other basic needs. She denies this and is not able to tell me what she has eaten in the last 1-2 days. No new concerns. She does not want to be on memory medication and has hidden it at her home in the past to avoid taking it.   Review of Systems  Constitutional: Negative for fever, activity change, appetite change and fatigue.  HENT: Negative.   Eyes: Negative.   Respiratory: Negative for cough, chest tightness, shortness of breath and wheezing.   Cardiovascular: Negative for chest pain, palpitations and leg swelling.  Gastrointestinal: Positive for constipation. Negative for nausea, vomiting, abdominal pain, diarrhea, blood in stool and abdominal distention.  Neurological: Negative.        Memory change  Psychiatric/Behavioral: Positive for sleep disturbance.      Objective:   Physical Exam  Constitutional: She is oriented to person, place, and time. She appears well-developed and well-nourished.  Thin  HENT:  Head: Normocephalic and atraumatic.  Eyes: EOM are normal.  Neck: Normal range of motion.  Cardiovascular: Normal rate and regular rhythm.   Pulmonary/Chest: Effort normal and breath sounds normal. No respiratory distress. She has no wheezes. She has no rales.  Abdominal: Soft. Bowel sounds are normal. She exhibits no distension and no mass. There is no tenderness. There is no rebound.  Neurological: She is alert and  oriented to person, place, and time. Coordination normal.  Skin: Skin is warm and dry.  Vitals reviewed.  Filed Vitals:   04/06/15 1131  BP: 132/78  Pulse: 53  Temp: 97.9 F (36.6 C)  TempSrc: Oral  Resp: 12  Height: 5\' 3"  (1.6 m)  Weight: 106 lb (48.081 kg)  SpO2: 97%      Assessment & Plan:

## 2015-04-09 NOTE — Assessment & Plan Note (Signed)
Does well with rare albuterol usage. No flare today. Would rate as mild at this time. Not a current smoker.

## 2015-04-09 NOTE — Assessment & Plan Note (Signed)
With this new history from her son about her memory and not eating this could have been related to her low weights. He is now making sure that she eats but she self-describes her memory as poor.

## 2015-04-09 NOTE — Assessment & Plan Note (Signed)
BP well controlled on losartan and metoprolol. She is okay with taking these medications as she believes in their purpose.

## 2015-04-09 NOTE — Assessment & Plan Note (Signed)
This has impacted her memory and feel that she has some vascular dementia. She does have good remote memory but recent recall is very poor. He son has concerns about her safety in living by herself. Will fill out FL-2 forms if needed. Home therapy now to see if we can recover some of her strength since her UTI since she is not as mobile and functional as she has been in the past. Will not rx memory medication as she is very against this.

## 2015-04-25 ENCOUNTER — Telehealth: Payer: Self-pay | Admitting: Internal Medicine

## 2015-04-25 NOTE — Telephone Encounter (Signed)
I'm not sure about the bathtub seat but can you get an FL2 and I can fill it out and we can fax to the nursing home if they have the information.

## 2015-04-25 NOTE — Telephone Encounter (Addendum)
Patient's son needs a prescription for a bathtub seat for his mother.  He states that it has support arms on it. He would also like a form filled out so his mother can get into a nursing home on [address] that is affiliated with [facility] hospital.

## 2015-05-12 ENCOUNTER — Telehealth: Payer: Self-pay | Admitting: Internal Medicine

## 2015-05-12 NOTE — Telephone Encounter (Signed)
FL2 forms are at the nursing facilities. We do not have them here in the office.   Pt son is calling in regarding and FL2 form.

## 2015-05-12 NOTE — Telephone Encounter (Signed)
disregard

## 2015-05-12 NOTE — Telephone Encounter (Signed)
Patient's son states that the [facility] form was brought in at her last appointment. He says he will drop another on off just in case it was misplaced.  Also, he says she has gotten worse since her last appointment. He's not sure if he needs to take her to [place] or bring her back in. She is having serious mental issues. She is being combative and is opening doors and trying to leave the house. He's also worried about being held responsible for her if she ever does leave.

## 2015-05-15 NOTE — Telephone Encounter (Signed)
[name]) states he could bring patient in tomorrow 11am or after.  I know schedule is booked for tomorrow.  Every provider is also booked for tomorrow.  Son can be reached at [phone] or [phone](sometimes patient answers this phone and will hang up).

## 2015-05-15 NOTE — Telephone Encounter (Signed)
Unfortunately I do not have any openings in my schedule this week with the holiday. Would recommend taking her to an ER or urgent care so they can rule out infection which could be causing worsening. She would need to be seen by someone.

## 2015-05-15 NOTE — Telephone Encounter (Signed)
Patients son has called and stated that his mother has gotten worse. She has been walking out of the house, doing things at night to where no one can sleep. She makes comments about not hurting her self or anyone else, also not killing them. She is also not taking her medication. She will hold them under her tongue and not swallow them. He tried to get her an appointment with the [facility] and they they told him that it sounded more like a dementia issue and she needed to be seen by her PCP. He wants to try to get another appointment with you to see where he needs to go from here. He can not get home health for her because they said she isn't in need for that yet. Dickey Gave from [place] is trying to help him get his mother into a nursing home but she needs the [facility] formed filled out before she can do so.

## 2015-05-24 ENCOUNTER — Telehealth: Payer: Self-pay | Admitting: Internal Medicine

## 2015-05-24 NOTE — Telephone Encounter (Signed)
Kathryn Whitaker called to request [facility] services. He is waiting on his FL2 to come, but has not received. In the meantime, he is asking for [place] to order services for home nursing   Please give him a call when we submit this

## 2015-05-25 NOTE — Telephone Encounter (Signed)
From the information that i was given, it is because of the fact that he is caring for her full time. That is the extent of the information that he gave, however.

## 2015-05-25 NOTE — Telephone Encounter (Signed)
FL-2 filled out and should have been faxed yesterday. What is her need for home nursing (does she have a wound or injury?)

## 2015-05-25 NOTE — Telephone Encounter (Signed)
There are home care agencies which are not covered by insurance. Home nursing is only covered if they are tending to a wound.

## 2015-05-26 NOTE — Telephone Encounter (Signed)
Spoke to Kathryn Whitaker and I am mailing him the FL2. I explained that home health is not covered because patient does not have a wound.

## 2015-06-03 ENCOUNTER — Emergency Department (HOSPITAL_COMMUNITY): Payer: Medicare Other

## 2015-06-03 ENCOUNTER — Encounter (HOSPITAL_COMMUNITY): Payer: Self-pay

## 2015-06-03 ENCOUNTER — Observation Stay (HOSPITAL_COMMUNITY)
Admission: EM | Admit: 2015-06-03 | Discharge: 2015-06-06 | Disposition: A | Discharge status: Skilled Nursing Facility | Payer: Medicare Other | Attending: Internal Medicine | Admitting: Internal Medicine

## 2015-06-03 DIAGNOSIS — E7800 Pure hypercholesterolemia, unspecified: Secondary | ICD-10-CM | POA: Diagnosis not present

## 2015-06-03 DIAGNOSIS — E876 Hypokalemia: Secondary | ICD-10-CM | POA: Insufficient documentation

## 2015-06-03 DIAGNOSIS — Z8673 Personal history of transient ischemic attack (TIA), and cerebral infarction without residual deficits: Secondary | ICD-10-CM | POA: Insufficient documentation

## 2015-06-03 DIAGNOSIS — J449 Chronic obstructive pulmonary disease, unspecified: Secondary | ICD-10-CM | POA: Insufficient documentation

## 2015-06-03 DIAGNOSIS — E039 Hypothyroidism, unspecified: Secondary | ICD-10-CM | POA: Diagnosis present

## 2015-06-03 DIAGNOSIS — Z86010 Personal history of colonic polyps: Secondary | ICD-10-CM | POA: Diagnosis not present

## 2015-06-03 DIAGNOSIS — E43 Unspecified severe protein-calorie malnutrition: Secondary | ICD-10-CM | POA: Diagnosis not present

## 2015-06-03 DIAGNOSIS — R410 Disorientation, unspecified: Secondary | ICD-10-CM

## 2015-06-03 DIAGNOSIS — J42 Unspecified chronic bronchitis: Secondary | ICD-10-CM | POA: Diagnosis not present

## 2015-06-03 DIAGNOSIS — R1310 Dysphagia, unspecified: Secondary | ICD-10-CM | POA: Diagnosis not present

## 2015-06-03 DIAGNOSIS — I10 Essential (primary) hypertension: Secondary | ICD-10-CM | POA: Insufficient documentation

## 2015-06-03 DIAGNOSIS — Z7982 Long term (current) use of aspirin: Secondary | ICD-10-CM | POA: Diagnosis not present

## 2015-06-03 DIAGNOSIS — K219 Gastro-esophageal reflux disease without esophagitis: Secondary | ICD-10-CM | POA: Diagnosis not present

## 2015-06-03 DIAGNOSIS — R627 Adult failure to thrive: Secondary | ICD-10-CM | POA: Diagnosis not present

## 2015-06-03 DIAGNOSIS — Z79899 Other long term (current) drug therapy: Secondary | ICD-10-CM | POA: Insufficient documentation

## 2015-06-03 DIAGNOSIS — K6289 Other specified diseases of anus and rectum: Secondary | ICD-10-CM | POA: Diagnosis not present

## 2015-06-03 DIAGNOSIS — F0390 Unspecified dementia without behavioral disturbance: Secondary | ICD-10-CM | POA: Diagnosis not present

## 2015-06-03 DIAGNOSIS — K5900 Constipation, unspecified: Secondary | ICD-10-CM | POA: Insufficient documentation

## 2015-06-03 LAB — URINALYSIS, ROUTINE W REFLEX MICROSCOPIC
Glucose, UA: NEGATIVE mg/dL
HGB URINE DIPSTICK: NEGATIVE
Ketones, ur: 15 mg/dL — AB
Leukocytes, UA: NEGATIVE
Nitrite: NEGATIVE
Protein, ur: NEGATIVE mg/dL
SPECIFIC GRAVITY, URINE: 1.022 (ref 1.005–1.030)
pH: 5.5 (ref 5.0–8.0)

## 2015-06-03 LAB — CBC WITH DIFFERENTIAL/PLATELET
Basophils Absolute: 0 10*3/uL (ref 0.0–0.1)
Basophils Relative: 0 %
EOS ABS: 0 10*3/uL (ref 0.0–0.7)
EOS PCT: 0 %
HCT: 42.4 % (ref 36.0–46.0)
HEMOGLOBIN: 13.7 g/dL (ref 12.0–15.0)
LYMPHS ABS: 0.9 10*3/uL (ref 0.7–4.0)
Lymphocytes Relative: 22 %
MCH: 30.6 pg (ref 26.0–34.0)
MCHC: 32.3 g/dL (ref 30.0–36.0)
MCV: 94.6 fL (ref 78.0–100.0)
MONO ABS: 0.2 10*3/uL (ref 0.1–1.0)
MONOS PCT: 4 %
Neutro Abs: 3.1 10*3/uL (ref 1.7–7.7)
Neutrophils Relative %: 74 %
PLATELETS: 201 10*3/uL (ref 150–400)
RBC: 4.48 MIL/uL (ref 3.87–5.11)
RDW: 13.1 % (ref 11.5–15.5)
WBC: 4.2 10*3/uL (ref 4.0–10.5)

## 2015-06-03 LAB — COMPREHENSIVE METABOLIC PANEL
ALBUMIN: 3.4 g/dL — AB (ref 3.5–5.0)
ALK PHOS: 54 U/L (ref 38–126)
ALT: 12 U/L — ABNORMAL LOW (ref 14–54)
AST: 19 U/L (ref 15–41)
Anion gap: 9 (ref 5–15)
BILIRUBIN TOTAL: 1.4 mg/dL — AB (ref 0.3–1.2)
BUN: 19 mg/dL (ref 6–20)
CHLORIDE: 104 mmol/L (ref 101–111)
CO2: 30 mmol/L (ref 22–32)
CREATININE: 0.7 mg/dL (ref 0.44–1.00)
Calcium: 9.4 mg/dL (ref 8.9–10.3)
GFR calc Af Amer: >60 mL/min (ref >60)
GFR calc non Af Amer: >60 mL/min (ref >60)
GLUCOSE: 86 mg/dL (ref 65–99)
Potassium: 3.3 mmol/L — ABNORMAL LOW (ref 3.5–5.1)
Sodium: 143 mmol/L (ref 135–145)
Total Protein: 6.7 g/dL (ref 6.5–8.1)

## 2015-06-03 LAB — PROTIME-INR
INR: 1.04 (ref 0.00–1.49)
PROTHROMBIN TIME: 13.8 s (ref 11.6–15.2)

## 2015-06-03 LAB — TSH: TSH: 10.652 u[IU]/mL — ABNORMAL HIGH (ref 0.350–4.500)

## 2015-06-03 MED ORDER — ADULT MULTIVITAMIN W/MINERALS CH
1.0000 | ORAL_TABLET | Freq: Every day | ORAL | Status: DC
  Administered 2015-06-03 – 2015-06-06 (×4): 1 via ORAL
  Filled 2015-06-03 (×4): qty 1

## 2015-06-03 MED ORDER — MORPHINE SULFATE (PF) 2 MG/ML IV SOLN
1.0000 mg | INTRAVENOUS | Status: DC | PRN
Start: 2015-06-03 — End: 2015-06-06

## 2015-06-03 MED ORDER — MULTI-VITAMIN/MINERALS PO TABS: 1.0000 | ORAL_TABLET | Freq: Every day | ORAL | Status: DC

## 2015-06-03 MED ORDER — ACETAMINOPHEN 325 MG PO TABS
650.0000 mg | ORAL_TABLET | Freq: Four times a day (QID) | ORAL | Status: DC | PRN
  Administered 2015-06-04: 650 mg via ORAL
  Filled 2015-06-03: qty 2

## 2015-06-03 MED ORDER — ENOXAPARIN SODIUM 40 MG/0.4ML ~~LOC~~ SOLN
40.0000 mg | SUBCUTANEOUS | Status: DC
  Administered 2015-06-03 – 2015-06-05 (×3): 40 mg via SUBCUTANEOUS
  Filled 2015-06-03 (×4): qty 0.4

## 2015-06-03 MED ORDER — ASPIRIN 81 MG PO TABS: 81.0000 mg | ORAL_TABLET | Freq: Every day | ORAL | Status: DC

## 2015-06-03 MED ORDER — ASPIRIN 81 MG PO CHEW
81.0000 mg | CHEWABLE_TABLET | Freq: Every day | ORAL | Status: DC
  Administered 2015-06-03 – 2015-06-06 (×4): 81 mg via ORAL
  Filled 2015-06-03 (×4): qty 1

## 2015-06-03 MED ORDER — KCL IN DEXTROSE-NACL 10-5-0.45 MEQ/L-%-% IV SOLN
INTRAVENOUS | Status: DC
  Administered 2015-06-03 – 2015-06-06 (×3): via INTRAVENOUS
  Filled 2015-06-03 (×7): qty 1000

## 2015-06-03 MED ORDER — POTASSIUM CHLORIDE CRYS ER 20 MEQ PO TBCR: 40.0000 meq | EXTENDED_RELEASE_TABLET | Freq: Once | ORAL | Status: DC

## 2015-06-03 MED ORDER — QUETIAPINE FUMARATE 25 MG PO TABS
25.0000 mg | ORAL_TABLET | Freq: Every day | ORAL | Status: DC
  Administered 2015-06-03 – 2015-06-05 (×3): 25 mg via ORAL
  Filled 2015-06-03 (×4): qty 1

## 2015-06-03 MED ORDER — ACETAMINOPHEN 650 MG RE SUPP: 650.0000 mg | Freq: Four times a day (QID) | RECTAL | Status: DC | PRN

## 2015-06-03 MED ORDER — IOHEXOL 300 MG/ML  SOLN
100.0000 mL | Freq: Once | INTRAMUSCULAR | Status: AC | PRN
  Administered 2015-06-03: 80 mL via INTRAVENOUS

## 2015-06-03 MED ORDER — LORAZEPAM 2 MG/ML IJ SOLN: 0.2500 mg | Freq: Once | INTRAMUSCULAR | Status: DC

## 2015-06-03 MED ORDER — HYDROCODONE-ACETAMINOPHEN 5-325 MG PO TABS
1.0000 | ORAL_TABLET | ORAL | Status: DC | PRN
  Administered 2015-06-05 – 2015-06-06 (×3): 1 via ORAL
  Filled 2015-06-03 (×3): qty 1

## 2015-06-03 MED ORDER — ONDANSETRON HCL 4 MG PO TABS: 4.0000 mg | ORAL_TABLET | Freq: Four times a day (QID) | ORAL | Status: DC | PRN

## 2015-06-03 MED ORDER — LOSARTAN POTASSIUM 25 MG PO TABS
25.0000 mg | ORAL_TABLET | Freq: Every day | ORAL | Status: DC
  Administered 2015-06-03 – 2015-06-06 (×4): 25 mg via ORAL
  Filled 2015-06-03 (×4): qty 1

## 2015-06-03 MED ORDER — IOHEXOL 300 MG/ML  SOLN
25.0000 mL | Freq: Once | INTRAMUSCULAR | Status: AC | PRN
  Administered 2015-06-03: 25 mL via ORAL

## 2015-06-03 MED ORDER — POLYETHYLENE GLYCOL 3350 17 G PO PACK
17.0000 g | PACK | Freq: Every day | ORAL | Status: DC
  Administered 2015-06-03 – 2015-06-06 (×4): 17 g via ORAL
  Filled 2015-06-03 (×4): qty 1

## 2015-06-03 MED ORDER — ONDANSETRON HCL 4 MG/2ML IJ SOLN: 4.0000 mg | Freq: Four times a day (QID) | INTRAMUSCULAR | Status: DC | PRN

## 2015-06-03 MED ORDER — LORAZEPAM 0.5 MG PO TABS
0.2500 mg | ORAL_TABLET | Freq: Once | ORAL | Status: AC
  Administered 2015-06-03: 0.25 mg via ORAL
  Filled 2015-06-03: qty 1

## 2015-06-03 MED ORDER — SODIUM CHLORIDE 0.9 % IV BOLUS (SEPSIS)
250.0000 mL | Freq: Once | INTRAVENOUS | Status: AC
  Administered 2015-06-03: 250 mL via INTRAVENOUS

## 2015-06-03 MED ORDER — PHENAZOPYRIDINE HCL 100 MG PO TABS
100.0000 mg | ORAL_TABLET | Freq: Once | ORAL | Status: AC
  Administered 2015-06-03: 100 mg via ORAL
  Filled 2015-06-03: qty 1

## 2015-06-03 NOTE — H&P (Signed)
[facility] History and Physical  [name] [id number] DOB: 1920-02-18 DOA: 06/03/2015  Referring physician: EDP PCP: Kathryn Koch, MD   Chief Complaint: Rectal pain  HPI: Kathryn Whitaker is a 81 y.o. female with past medical history of GERD, stroke and anxiety brought to the hospital by her family because of rectal pain. 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 For the past several weeks there approaching her primary care physician to get her to SNF. Patient mentioned that she has some rectal pain for the past several days, she was [facility] diagnosed with fecal impaction, given an enema and discharged home. He couldn't get the records from there but this is what the granddaughter at bedside told me. Patient was still complaining about the rectal pain so she came in to the hospital for further evaluation. Patient also complained about difficulty swallowing medications. CT in the ED showed possible proctitis, while she was in the emergency department EDP mentioned some delirium and altered mental status. To me she was very articulate, did not have any hallucination provided most of the history.  Review of Systems:  Constitutional: negative for anorexia, fevers and sweats Eyes: negative for irritation, redness and visual disturbance Ears, nose, mouth, throat, and face: negative for earaches, epistaxis, nasal congestion and sore throat Respiratory: negative for cough, dyspnea on exertion, sputum and wheezing Cardiovascular: negative for chest pain, dyspnea, lower extremity edema, orthopnea, palpitations and syncope Gastrointestinal: Dysphagia and rectal pain, has weight loss Genitourinary:negative for dysuria, frequency and hematuria Hematologic/lymphatic: negative for bleeding, easy bruising and lymphadenopathy Musculoskeletal:negative for arthralgias, muscle weakness and stiff  joints Neurological: negative for coordination problems, gait problems, headaches and weakness Endocrine: negative for diabetic symptoms including polydipsia, polyuria and weight loss Allergic/Immunologic: negative for anaphylaxis, hay fever and urticaria  Past Medical History  Diagnosis Date  . Allergic rhinitis   . Acute asthmatic bronchitis   . Hypertension   . Hypercholesteremia   . GERD (gastroesophageal reflux disease)   . Diverticulosis of colon   . IBS (irritable bowel syndrome)   . History of colonic polyps   . UTI (lower urinary tract infection)   . DJD (degenerative joint disease)   . Lumbar back pain   . Lacunar infarction ([place])   . Vitamin D deficiency   . Anxiety    Past Surgical History  Procedure Laterality Date  . Abdominal hysterectomy     Social History:   reports that she has never smoked. She has never used smokeless tobacco. She reports that she does not drink alcohol or use illicit drugs.  Allergies  Allergen Reactions  . Demerol [Meperidine]     Pt states she can't remember type of reaction  . Influenza Vaccines     Pt can't remember reaction but states allergic and has not had in 25 years  . Peanuts [Peanut Oil]     Pt reports she is allergic to peanuts, but was asking to eat peanut butter. Pt unsure of her reaction.   [name] [Amoxicill-Clarithro-Lansopraz] Hives    .Marland KitchenHas patient had a PCN reaction causing immediate rash, facial/tongue/throat swelling, SOB or lightheadedness with hypotension: unknown Has patient had a PCN reaction causing severe rash involving mucus membranes or skin necrosis: unknown Has patient had a PCN reaction that required hospitalization unknown Has patient had a PCN reaction occurring within the last 10 years: unknown If all of the above answers are "NO",  then may proceed with Cephalosporin use.   . Telithromycin Hives    REACTION: pt states HIVES....    Family history No family history of colon cancer  Prior to  Admission medications   Medication Sig Start Date End Date Taking? Authorizing Provider  albuterol (PROVENTIL HFA) 108 (90 BASE) MCG/ACT inhaler Inhale 2 puffs into the lungs every 6 (six) hours as needed. For shortness of breath 06/27/14  Yes Delfina Redwood, MD  aspirin 81 MG tablet Take 81 mg by mouth daily.   Yes Historical Provider, MD  losartan (COZAAR) 25 MG tablet Take 1 tablet (25 mg total) by mouth daily. 06/27/14  Yes Delfina Redwood, MD  Multiple Vitamins-Minerals (MULTIVITAMIN WITH MINERALS) tablet Take 1 tablet by mouth daily.   Yes Historical Provider, MD  polyethylene glycol (MIRALAX / GLYCOLAX) packet Take 17 g by mouth daily.   Yes Historical Provider, MD  Propylene Glycol (SYSTANE BALANCE OP) Apply 1 application to eye as needed (for dry eyes).    Yes Historical Provider, MD  acetaminophen (TYLENOL) 325 MG tablet Take 2 tablets (650 mg total) by mouth every 6 (six) hours as needed. Patient not taking: Reported on 04/06/2015 12/14/12   Venetia Maxon Rama, MD  docusate sodium (COLACE) 100 MG capsule Take 1 capsule (100 mg total) by mouth every 12 (twelve) hours. Patient not taking: Reported on 06/03/2015 11/24/13   Sherwood Gambler, MD  levofloxacin (LEVAQUIN) 500 MG tablet Take 500 mg by mouth daily. For 7 days 05/30/15   Historical Provider, MD  metoprolol succinate (TOPROL XL) 25 MG 24 hr tablet Take 1 tablet (25 mg total) by mouth daily. Patient not taking: Reported on 01/23/2015 06/27/14   Delfina Redwood, MD   Physical Exam: Filed Vitals:   06/03/15 1113 06/03/15 1512  BP: 176/82 169/90  Pulse: 66 104  Temp:  97.7 F (36.5 C)  Resp: 18 22   Constitutional: Oriented to person, place, and time. Well-developed and well-nourished. Cooperative.  Head: Normocephalic and atraumatic.  Nose: Nose normal.  Mouth/Throat: Uvula is midline, oropharynx is clear and moist and mucous membranes are normal.  Eyes: Conjunctivae and EOM are normal. Pupils are equal, round, and reactive to  light.  Neck: Trachea normal and normal range of motion. Neck supple.  Cardiovascular: Normal rate, regular rhythm, S1 normal, S2 normal, normal heart sounds and intact distal pulses.   Pulmonary/Chest: Effort normal and breath sounds normal.  Abdominal: Soft. Bowel sounds are normal. There is no hepatosplenomegaly. There is no tenderness.  Musculoskeletal: Normal range of motion.  Neurological: Alert and oriented to person, place, and time. Has normal strength. No cranial nerve deficit or sensory deficit.  Skin: Skin is warm, dry and intact.  Psychiatric: Has a normal mood and affect. Speech is normal and behavior is normal.   Labs on Admission:  Basic Metabolic Panel:  Recent Labs Lab 06/03/15 0956  NA 143  K 3.3*  CL 104  CO2 30  GLUCOSE 86  BUN 19  CREATININE 0.70  CALCIUM 9.4   Liver Function Tests:  Recent Labs Lab 06/03/15 0956  AST 19  ALT 12*  ALKPHOS 54  BILITOT 1.4*  PROT 6.7  ALBUMIN 3.4*   No results for input(s): LIPASE, AMYLASE in the last 168 hours. No results for input(s): AMMONIA in the last 168 hours. CBC:  Recent Labs Lab 06/03/15 0956  WBC 4.2  NEUTROABS 3.1  HGB 13.7  HCT 42.4  MCV 94.6  PLT 201   Cardiac Enzymes:  No results for input(s): CKTOTAL, CKMB, CKMBINDEX, TROPONINI in the last 168 hours.  BNP (last 3 results) No results for input(s): BNP in the last 8760 hours.  ProBNP (last 3 results) No results for input(s): PROBNP in the last 8760 hours.  CBG: No results for input(s): GLUCAP in the last 168 hours.  Radiological Exams on Admission: Ct Abdomen Pelvis W Contrast  06/03/2015  CLINICAL DATA:  Rectal pain for 1 week. Recent diagnosis of fecal impaction EXAM: CT ABDOMEN AND PELVIS WITH CONTRAST TECHNIQUE: Multidetector CT imaging of the abdomen and pelvis was performed using the standard protocol following bolus administration of intravenous contrast. CONTRAST:  2mL OMNIPAQUE IOHEXOL 300 MG/ML SOLN, 59mL OMNIPAQUE IOHEXOL  300 MG/ML SOLN COMPARISON:  CT 01/23/2015 FINDINGS: Lower chest: Lung bases are clear. Hepatobiliary: RIGHT hepatic lobe cyst again noted no biliary duct dilatation. Gallbladder normal. Common bile duct normal Pancreas: Pancreas is normal. No ductal dilatation. No pancreatic inflammation. Spleen: Normal spleen Adrenals/urinary tract: Adrenal glands normal. There bilateral simple fluid attenuation rounded renal lesions. No obstructive uropathy. Bladder normal. Stomach/Bowel: Stomach, small bowel, appendix, and cecum are normal. Several diverticula of the descending colon sigmoid colon. No significant stool burden in the rectum. There is a thickening most distal rectum / anus to 13 mm (image 70, series 2) Vascular/Lymphatic: Abdominal aorta is normal caliber with atherosclerotic calcification. There is no retroperitoneal or periportal lymphadenopathy. No pelvic lymphadenopathy. Reproductive: Post hysterectomy. Other: No free fluid. Musculoskeletal: No aggressive osseous lesion. IMPRESSION: 1. No significant stool burden in the rectum. 2. Thickening of the anus vs distal rectum. Cannot exclude distal proctitis. Majority of the rectum is normal. 3. Mild to moderate sigmoid diverticulosis without diverticulitis. Electronically Signed   By: Suzy Bouchard M.D.   On: 06/03/2015 14:47    EKG: Independently reviewed.   Assessment/Plan Principal Problem:   Rectal pain Active Problems:   HTN (hypertension)   COPD (chronic obstructive pulmonary disease) (HCC)   Protein-calorie malnutrition, severe (HCC)   Delirium    Rectal pain Probable proctitis according to the CT scan, no evidence of impaction. According to their story patient had recent stool impaction treated with an enema at [facility]. Proctitis could be stercoral ulcer or just simply irritation from enemas. Provide MiraLAX and Tylenol for pain control.  Failure to thrive According to the records for the past year patient lost about 14  pounds. Patient mentioned loss of appetite and difficulty swallowing. We'll ask SLP for evaluation. Meanwhile soft food, patient does not have upper dentition.  Delirium Per EDP report, patient was not delirious to me, head CT pending. No evidence of UTI or other infection.  Severe protein energy malnutrition Lower albumin, loss of appetite and weight loss. RD to see.  Pericolonic mass There is 3.2 cm complex structure adjacent to the descending colon, seen in CT scan on August 2016. According to the CT it possible to represent low-grade malignancy. No mention in the CT scan from earlier today about any perirectal masses. This is discussed with granddaughter at bedside, family is aware about the possible malignancy, because of the advanced age no further workup.  Hypokalemia Potassium of 3.3, replete with oral supplements.   Code Status: Full code Family Communication: Plan discussed with granddaughter at bedside. Disposition Plan: Observation  Time spent: 70 minutes  Estelle Greenleaf A, MD [facility] Pager [phone]

## 2015-06-03 NOTE — Care Management Note (Signed)
Case Management Note  Patient Details  Name: BEVELYN ALBER MRN: [id number] Date of Birth: 11/17/1919  Subjective/Objective:      Failure to thrive              Action/Plan: NCM spoke to son, Mr Ephriam Knuckles # [phone]. States the plan is SNF. Son states he is planning to take her to [facility] on Monday.   Expected Discharge Date:  06/03/2015               Expected Discharge Plan:  Skilled Nursing Facility  In-House Referral:  Clinical Social Work  Discharge planning Services  CM Consult  Post Acute Care Choice:  NA Choice offered to:  NA  DME Arranged:  N/A DME Agency:  NA  HH Arranged:  NA HH Agency:  NA  Status of Service:  Completed, signed off  Medicare Important Message Given:    Date Medicare IM Given:    Medicare IM give by:    Date Additional Medicare IM Given:    Additional Medicare Important Message give by:     If discussed at [place] of Stay Meetings, dates discussed:    Additional Comments:  Erenest Rasher, RN 06/03/2015, 5:14 PM

## 2015-06-03 NOTE — ED Notes (Signed)
Pt presents with c/o rectal pain for over a week per her son. [name] reports that she did see the doctor for this pain earlier this week and diagnosed with a fecal impaction. Son also reports that she was diagnosed with a UTI.

## 2015-06-03 NOTE — ED Notes (Signed)
Nurse at bedside starting iv and collecting labs

## 2015-06-03 NOTE — Progress Notes (Signed)
4:03pm. CSW met with pt, pt's granddaughter Dickey Gave [phone], [phone]), and pt's son Ephriam Knuckles [phone]) at bedside. Pt has been living with her son in [place] for the past 4 months and he and his wife have cared for her. Prior to that, Dickey Gave cared for her. Son reports that pt has not been taking medications, has alzheimers and wanders from the house. Pt had been at [facility] [place], but her needs were too great for that level of care. He expressed some overwhelm. He has been in communication with Katharine Look at [facility] to arrange for skilled care there. He is able to pay out of pocket for her. He brought completed FL2 for her from [facility].  CSW called [facility]. They are unable to accept pt this weekend, but should open up Monday. CSW relayed that to son. Son and pt will return to son's home in [place] until placement at [facility].  Pt was tearful, stating that she wants to go into a nursing facility or "just go ahead and die." Pt stated she was upset that she could not go to a facility today, but expresses that she understands and thinks she will hold on until Monday. CSW provided supportive counseling and answered pt and family questions.  CSW to sign off.  [place] [facility] phone: [phone] She expressed that she feels her son no longer wants to care for her.

## 2015-06-03 NOTE — ED Notes (Signed)
Attempted to call report to 5 E. RN unable to take report at this time. 

## 2015-06-03 NOTE — ED Notes (Signed)
Patient transported to CT 

## 2015-06-03 NOTE — ED Notes (Signed)
[name] at bedside speaking with Dr. Ralene Bathe

## 2015-06-03 NOTE — Progress Notes (Signed)
CM received call from EDP re:  [facility] placement of pt from ED. Reviewed record which reveals son is in process. Pt has No reason for admission to hospital  at present. Will refer to CM on site at [facility] today. Jonnie Finner, RN [phone]. Delrae Sawyers, [place]

## 2015-06-03 NOTE — ED Notes (Signed)
Pt tolerating PO fluids

## 2015-06-03 NOTE — ED Provider Notes (Signed)
CSN: [id number]     Arrival date & time 06/03/15  [id number] History   First MD Initiated Contact with Patient 06/03/15 [phone]     Chief Complaint  Patient presents with  . Rectal Pain     The history is provided by the patient and a relative. No language interpreter was used.   Kathryn Whitaker is a 81 y.o. female who presents to the Emergency Department complaining of rectal pain. The history is provided by the patient and her son. They reports several months of progressive rectal pain and constipation as well as decreased oral intake. The son is concerned that he cannot care for her safely at home. As she is noncompliant with medications and tried to leave the house at times. He reports that she has minimal oral intake. They deny fevers, vomiting, dysuria. She does endorse constipation and poor appetite. She was seen in the emergency department at [facility] 5 days ago and was diagnosed with fecal impaction and sent home on MiraLAX. She has not been taking the MiraLAX. Son is attempting placement in [place]. Patient denies any SI but she says it would be easier if she died. Son states that she has been hallucinating at times over the last week and once this week she ran out of the house and very little clothing said she was going to go off in the woods.  Past Medical History  Diagnosis Date  . Allergic rhinitis   . Acute asthmatic bronchitis   . Hypertension   . Hypercholesteremia   . GERD (gastroesophageal reflux disease)   . Diverticulosis of colon   . IBS (irritable bowel syndrome)   . History of colonic polyps   . UTI (lower urinary tract infection)   . DJD (degenerative joint disease)   . Lumbar back pain   . Lacunar infarction ([place])   . Vitamin D deficiency   . Anxiety    Past Surgical History  Procedure Laterality Date  . Abdominal hysterectomy     No family history on file. Social History  Substance Use Topics  . Smoking status: Never Smoker   . Smokeless tobacco: Never Used   . Alcohol Use: No   OB History    No data available     Review of Systems  All other systems reviewed and are negative.     Allergies  Demerol; Influenza vaccines; Peanuts; Prevpac; and Telithromycin  Home Medications   Prior to Admission medications   Medication Sig Start Date End Date Taking? Authorizing Provider  acetaminophen (TYLENOL) 325 MG tablet Take 2 tablets (650 mg total) by mouth every 6 (six) hours as needed. Patient not taking: Reported on 04/06/2015 12/14/12   Venetia Maxon Rama, MD  albuterol (PROVENTIL HFA) 108 (90 BASE) MCG/ACT inhaler Inhale 2 puffs into the lungs every 6 (six) hours as needed. For shortness of breath 06/27/14   Delfina Redwood, MD  aspirin 81 MG tablet Take 81 mg by mouth daily.    Historical Provider, MD  Cholecalciferol (VITAMIN D) 1000 UNITS capsule Take 1,000 Units by mouth daily.      Historical Provider, MD  docusate sodium (COLACE) 100 MG capsule Take 1 capsule (100 mg total) by mouth every 12 (twelve) hours. 11/24/13   Sherwood Gambler, MD  fish oil-omega-3 fatty acids 1000 MG capsule Take 1 g by mouth 3 (three) times daily.     Historical Provider, MD  losartan (COZAAR) 25 MG tablet Take 1 tablet (25 mg total) by mouth daily.  06/27/14   Delfina Redwood, MD  metoprolol succinate (TOPROL XL) 25 MG 24 hr tablet Take 1 tablet (25 mg total) by mouth daily. Patient not taking: Reported on 01/23/2015 06/27/14   Delfina Redwood, MD  Multiple Vitamins-Minerals (MULTIVITAMIN WITH MINERALS) tablet Take 1 tablet by mouth daily.    Historical Provider, MD  Propylene Glycol (SYSTANE BALANCE OP) Apply to eye.    Historical Provider, MD   BP 165/88 mmHg  Pulse 73  Temp(Src) 97.5 F (36.4 C) (Oral)  Resp 18  SpO2 99% Physical Exam  Constitutional: She is oriented to person, place, and time. She appears well-developed.  Frail  HENT:  Head: Normocephalic and atraumatic.  Cardiovascular: Normal rate and regular rhythm.   No murmur  heard. Pulmonary/Chest: Effort normal and breath sounds normal. No respiratory distress.  Abdominal: Soft. There is no tenderness. There is no rebound and no guarding.  Genitourinary:  Nontender rectal exam. No external hemorrhoids. No fecal impaction. No gross blood.  Musculoskeletal: She exhibits no edema or tenderness.  Neurological: She is alert and oriented to person, place, and time.  5 out of 5 strength in all four extremities  Skin: Skin is warm and dry.  Psychiatric: She has a normal mood and affect. Her behavior is normal.  Nursing note and vitals reviewed.   ED Course  Procedures (including critical care time) Labs Review Labs Reviewed  COMPREHENSIVE METABOLIC PANEL - Abnormal; Notable for the following:    Potassium 3.3 (*)    Albumin 3.4 (*)    ALT 12 (*)    Total Bilirubin 1.4 (*)    All other components within normal limits  URINALYSIS, ROUTINE W REFLEX MICROSCOPIC (NOT AT [facility]) - Abnormal; Notable for the following:    Color, Urine AMBER (*)    Bilirubin Urine SMALL (*)    Ketones, ur 15 (*)    All other components within normal limits  CBC WITH DIFFERENTIAL/PLATELET    Imaging Review Ct Abdomen Pelvis W Contrast  06/03/2015  CLINICAL DATA:  Rectal pain for 1 week. Recent diagnosis of fecal impaction EXAM: CT ABDOMEN AND PELVIS WITH CONTRAST TECHNIQUE: Multidetector CT imaging of the abdomen and pelvis was performed using the standard protocol following bolus administration of intravenous contrast. CONTRAST:  59mL OMNIPAQUE IOHEXOL 300 MG/ML SOLN, 59mL OMNIPAQUE IOHEXOL 300 MG/ML SOLN COMPARISON:  CT 01/23/2015 FINDINGS: Lower chest: Lung bases are clear. Hepatobiliary: RIGHT hepatic lobe cyst again noted no biliary duct dilatation. Gallbladder normal. Common bile duct normal Pancreas: Pancreas is normal. No ductal dilatation. No pancreatic inflammation. Spleen: Normal spleen Adrenals/urinary tract: Adrenal glands normal. There bilateral simple fluid attenuation  rounded renal lesions. No obstructive uropathy. Bladder normal. Stomach/Bowel: Stomach, small bowel, appendix, and cecum are normal. Several diverticula of the descending colon sigmoid colon. No significant stool burden in the rectum. There is a thickening most distal rectum / anus to 13 mm (image 70, series 2) Vascular/Lymphatic: Abdominal aorta is normal caliber with atherosclerotic calcification. There is no retroperitoneal or periportal lymphadenopathy. No pelvic lymphadenopathy. Reproductive: Post hysterectomy. Other: No free fluid. Musculoskeletal: No aggressive osseous lesion. IMPRESSION: 1. No significant stool burden in the rectum. 2. Thickening of the anus vs distal rectum. Cannot exclude distal proctitis. Majority of the rectum is normal. 3. Mild to moderate sigmoid diverticulosis without diverticulitis. Electronically Signed   By: Suzy Bouchard M.D.   On: 06/03/2015 14:47   I have personally reviewed and evaluated these images and lab results as part of my medical decision-making.  EKG Interpretation None      MDM   Final diagnoses:  Delirium    Patient here for evaluation of rectal pain and difficulty managing at home due to progressive confusion leaving the house. In terms of rectal pain and there is no clear source of her symptoms. CT scan does demonstrate some rectal versus anal thickening , clinical picture is not consistent with acute proctitis. During emergency department stay patient has become progressively paranoid and anxious stating her son is trying to get rid of her and states it would be easier if she were to die. Nursing staff has noted her to be hallucinating stating there are things on the floor that are not present. Patient is becoming more agitated on repeat evaluations and complains of pain in different locations. She states every time she feeds there is a rock over the pad is. She states every time she swallows something she is swelling a stone. She describes pain  and rash all over her body and appears very anxious. The son does not feel like he can safely take her home at this time. Discussed with the hospitalist regarding observation for new onset delirium as the symptoms have just begun in the last week. Social work has seen the patient and placement has been arranged at [facility] when the patient is stable.    Quintella Reichert, MD 06/03/15 [phone]

## 2015-06-04 DIAGNOSIS — R410 Disorientation, unspecified: Secondary | ICD-10-CM | POA: Diagnosis not present

## 2015-06-04 DIAGNOSIS — I10 Essential (primary) hypertension: Secondary | ICD-10-CM | POA: Diagnosis not present

## 2015-06-04 DIAGNOSIS — J42 Unspecified chronic bronchitis: Secondary | ICD-10-CM | POA: Diagnosis not present

## 2015-06-04 DIAGNOSIS — K6289 Other specified diseases of anus and rectum: Secondary | ICD-10-CM | POA: Diagnosis not present

## 2015-06-04 LAB — CBC
HCT: 35.4 % — ABNORMAL LOW (ref 36.0–46.0)
HEMOGLOBIN: 11.4 g/dL — AB (ref 12.0–15.0)
MCH: 29.6 pg (ref 26.0–34.0)
MCHC: 32.2 g/dL (ref 30.0–36.0)
MCV: 91.9 fL (ref 78.0–100.0)
Platelets: 184 10*3/uL (ref 150–400)
RBC: 3.85 MIL/uL — AB (ref 3.87–5.11)
RDW: 13.1 % (ref 11.5–15.5)
WBC: 5 10*3/uL (ref 4.0–10.5)

## 2015-06-04 LAB — BASIC METABOLIC PANEL
ANION GAP: 6 (ref 5–15)
BUN: 18 mg/dL (ref 6–20)
CALCIUM: 9.1 mg/dL (ref 8.9–10.3)
CHLORIDE: 103 mmol/L (ref 101–111)
CO2: 30 mmol/L (ref 22–32)
Creatinine, Ser: 0.75 mg/dL (ref 0.44–1.00)
GFR calc non Af Amer: >60 mL/min (ref >60)
Glucose, Bld: 180 mg/dL — ABNORMAL HIGH (ref 65–99)
POTASSIUM: 3.2 mmol/L — AB (ref 3.5–5.1)
Sodium: 139 mmol/L (ref 135–145)

## 2015-06-04 MED ORDER — POTASSIUM CHLORIDE CRYS ER 20 MEQ PO TBCR
60.0000 meq | EXTENDED_RELEASE_TABLET | Freq: Once | ORAL | Status: AC
  Administered 2015-06-04: 60 meq via ORAL
  Filled 2015-06-04: qty 3

## 2015-06-04 NOTE — Clinical Social Work Note (Signed)
CSW sent pt clinicals to [facility].  Per ED CSW pt's son has secured a bed for pt tomorrow (monday).  FL2 sent in system for Md signature.  Weekday CSW will send in hub once signed.    Dede Query, LCSW [place] Worker - Weekend Coverage cell #: [phone]

## 2015-06-04 NOTE — Clinical Social Work Note (Signed)
CSW called and spoke with MD regarding discharge summary.  CSW clarified with MD that pt is not medically ready for discharge today and MD stated that the discharge summary is an error, he meant to put it as a progress note.  CSW asked MD to change the summary to tomorrow's date since pt is not ready to be discharged today.  Dede Query, LCSW [place] Worker - Weekend Coverage cell #: [phone]

## 2015-06-04 NOTE — [place] FL2 (Signed)
[place] MEDICAID FL2 LEVEL OF CARE SCREENING TOOL     IDENTIFICATION  Patient Name: Kathryn Whitaker Birthdate: 28-Apr-1920 Sex: female Admission Date (Current Location): 06/03/2015  [facility] and [place] Number: Company secretary and Address:  [facility],  [place] [address], [place]      Provider Number: [id number]  Attending Physician Name and Address:  Verlee Monte, MD  Relative Name and Phone Number:       Current Level of Care: Hospital Recommended Level of Care: [place] Prior Approval Number:    Date Approved/Denied:   PASRR Number:    Discharge Plan: SNF    Current Diagnoses: Patient Active Problem List   Diagnosis Date Noted  . Delirium 06/03/2015  . Rectal pain 06/03/2015  . Protein-calorie malnutrition, severe ([place]) 06/26/2014  . Constipation 12/17/2013  . COPD (chronic obstructive pulmonary disease) ([place]) 06/03/2013  . IBS (irritable bowel syndrome) 12/30/2012  . Generalized anxiety disorder 12/30/2012  . Dyslipidemia 12/12/2012  . HTN (hypertension) 12/12/2012  . Hx of completed stroke 07/11/2009    Orientation RESPIRATION BLADDER Height & Weight    Self, Place  Normal Continent   106 lbs.  BEHAVIORAL SYMPTOMS/MOOD NEUROLOGICAL BOWEL NUTRITION STATUS      Continent Diet (Soft Diet)  AMBULATORY STATUS COMMUNICATION OF NEEDS Skin   Extensive Assist Verbally Normal                       Personal Care Assistance Level of Assistance  Bathing, Feeding, Dressing Bathing Assistance: Limited assistance Feeding assistance: Independent Dressing Assistance: Limited assistance     Functional Limitations Info  Sight, Hearing, Speech Sight Info: Impaired Hearing Info: Adequate Speech Info: Adequate    SPECIAL CARE FACTORS FREQUENCY  PT (By licensed PT), OT (By licensed OT)     PT Frequency: 4xs a week OT Frequency: 4xs a week            Contractures      Additional Factors Info  Code  Status, Allergies, Psychotropic Code Status Info: FULL Allergies Info: Demerol, Influenza Vaccines, Peanuts, Prevpac, Telithromycin Psychotropic Info: Seroquel 25mg  daily at bedtime         Current Medications (06/04/2015):  This is the current hospital active medication list Current Facility-Administered Medications  Medication Dose Route Frequency Provider Last Rate Last Dose  . acetaminophen (TYLENOL) tablet 650 mg  650 mg Oral Q6H PRN Verlee Monte, MD       Or  . acetaminophen (TYLENOL) suppository 650 mg  650 mg Rectal Q6H PRN Verlee Monte, MD      . aspirin chewable tablet 81 mg  81 mg Oral Daily Verlee Monte, MD   81 mg at 06/04/15 1059  . dextrose 5 % and 0.45 % NaCl with KCl 10 mEq/L infusion   Intravenous Continuous Verlee Monte, MD 75 mL/hr at 06/04/15 1155    . enoxaparin (LOVENOX) injection 40 mg  40 mg Subcutaneous Q24H Verlee Monte, MD   40 mg at 06/03/15 2327  . HYDROcodone-acetaminophen (NORCO/VICODIN) 5-325 MG per tablet 1-2 tablet  1-2 tablet Oral Q4H PRN Verlee Monte, MD      . losartan (COZAAR) tablet 25 mg  25 mg Oral Daily Verlee Monte, MD   25 mg at 06/04/15 1059  . morphine 2 MG/ML injection 1 mg  1 mg Intravenous Q4H PRN Verlee Monte, MD      . multivitamin with minerals tablet 1 tablet  1 tablet Oral Daily Mutaz  Elmahi, MD   1 tablet at 06/04/15 1059  . ondansetron (ZOFRAN) tablet 4 mg  4 mg Oral Q6H PRN Verlee Monte, MD       Or  . ondansetron (ZOFRAN) injection 4 mg  4 mg Intravenous Q6H PRN Mutaz Elmahi, MD      . polyethylene glycol (MIRALAX / GLYCOLAX) packet 17 g  17 g Oral Daily Verlee Monte, MD   17 g at 06/04/15 1059  . QUEtiapine (SEROQUEL) tablet 25 mg  25 mg Oral QHS Verlee Monte, MD   25 mg at 06/03/15 2328     Discharge Medications: Please see discharge summary for a list of discharge medications.  Relevant Imaging Results:  Relevant Lab Results:   Additional Information Social Security: [id number]  Carlean Jews, [place]

## 2015-06-04 NOTE — Clinical Social Work Note (Signed)
CSW spoke with MD regarding pt discharge.  CSW provided information that pt had a SNF bed at [facility] on  Monday and could go home with her son today if she was medically ready for discharge.  Per CSW in the ED pt's son has the funds to pay out of pocket for SNF and can take her home today if necessary.  Per MD pt requires some fluids due to dehydration and will not be medically ready today.  CSW attempted to complete FL2 but [employer] Must system is having problems.  Will continue to try today or week day CSW will have to complete tomorrow.  Per MD pt's primary care also provided an FL2 to SNF.  Marland KitchenDede Query, LCSW [facility] Clinical Social Worker - Weekend Coverage cell #: [phone]

## 2015-06-04 NOTE — Discharge Summary (Deleted)
[facility] PROGRESS NOTE   Kathryn Whitaker [id number] DOB: 1920-04-03 DOA: 06/03/2015 PCP: Hoyt Koch, MD  HPI/Subjective: She will watch was eating her breakfast this morning, feels much better. Had bowel movement last night, still complaining about pain. SNF placement, CSW to see.  Assessment/Plan: Principal Problem:   Rectal pain Active Problems:   HTN (hypertension)   COPD (chronic obstructive pulmonary disease) (HCC)   Protein-calorie malnutrition, severe (HCC)   Delirium   Rectal pain Probable proctitis according to the CT scan, no evidence of impaction. According to their story patient had recent stool impaction treated [facility]. Proctitis could be stercoral ulcer or just simply irritation from enemas. Provide MiraLAX and Tylenol for pain control.  Failure to thrive According to the records for the past year patient lost about 14 pounds. Patient mentioned loss of appetite and difficulty swallowing. We'll ask SLP for evaluation. Meanwhile soft food, patient does not have upper dentition.  Delirium Per EDP report, patient was not delirious to me, head CT pending. No evidence of UTI or other infection.  Severe protein energy malnutrition Lower albumin, loss of appetite and weight loss. RD to see.  Pericolonic mass There is 3.2 cm complex structure adjacent to the descending colon, seen in CT scan on August 2016. According to the CT it possible to represent low-grade malignancy. No mention in the CT scan from earlier today about any perirectal masses. This is discussed with granddaughter at bedside, family is aware about the possible malignancy, because of the advanced age no further workup.  Hypokalemia Potassium of 3.2, replete with oral supplements. Check BMP in a.m.  Code Status: Full Code Family Communication: Plan discussed with the patient. Disposition Plan: Remains inpatient Diet: DIET DYS 3 Room service  appropriate?: Yes; Fluid consistency:: Thin  Consultants:  None  Procedures:  None  Antibiotics:  None   Objective: Filed Vitals:   06/04/15 0250 06/04/15 0456  BP: 112/70 96/60  Pulse: 88 77  Temp:  98.5 F (36.9 C)  Resp:  18    Intake/Output Summary (Last 24 hours) at 06/04/15 1042 Last data filed at 06/04/15 0900  Gross per 24 hour  Intake    600 ml  Output      0 ml  Net    600 ml   There were no vitals filed for this visit.  Exam: General: Alert and awake, oriented x3, not in any acute distress. HEENT: anicteric sclera, pupils reactive to light and accommodation, EOMI CVS: S1-S2 clear, no murmur rubs or gallops Chest: clear to auscultation bilaterally, no wheezing, rales or rhonchi Abdomen: soft nontender, nondistended, normal bowel sounds, no organomegaly Extremities: no cyanosis, clubbing or edema noted bilaterally Neuro: Cranial nerves II-XII intact, no focal neurological deficits  Data Reviewed: Basic Metabolic Panel:  Recent Labs Lab 06/03/15 0956 06/04/15 0529  NA 143 139  K 3.3* 3.2*  CL 104 103  CO2 30 30  GLUCOSE 86 180*  BUN 19 18  CREATININE 0.70 0.75  CALCIUM 9.4 9.1   Liver Function Tests:  Recent Labs Lab 06/03/15 0956  AST 19  ALT 12*  ALKPHOS 54  BILITOT 1.4*  PROT 6.7  ALBUMIN 3.4*   No results for input(s): LIPASE, AMYLASE in the last 168 hours. No results for input(s): AMMONIA in the last 168 hours. CBC:  Recent Labs Lab 06/03/15 0956 06/04/15 0529  WBC 4.2 5.0  NEUTROABS 3.1  --   HGB 13.7 11.4*  HCT 42.4 35.4*  MCV 94.6 91.9  PLT 201 184   Cardiac Enzymes: No results for input(s): CKTOTAL, CKMB, CKMBINDEX, TROPONINI in the last 168 hours. BNP (last 3 results) No results for input(s): BNP in the last 8760 hours.  ProBNP (last 3 results) No results for input(s): PROBNP in the last 8760 hours.  CBG: No results for input(s): GLUCAP in the last 168 hours.  Micro No results found for this or any  previous visit (from the past 240 hour(s)).   Studies: Ct Head Wo Contrast  06/03/2015  CLINICAL DATA:  Increased confusion. EXAM: CT HEAD WITHOUT CONTRAST TECHNIQUE: Contiguous axial images were obtained from the base of the skull through the vertex without intravenous contrast. COMPARISON:  CT scan of April 10, 2009. FINDINGS: Bony calvarium appears intact. Mild diffuse cortical atrophy is noted. Mild chronic ischemic white matter disease is noted. Old right posterior cerebellar infarction is noted. No mass effect or midline shift is noted. Ventricular size is within normal limits. There is no evidence of mass lesion, hemorrhage or acute infarction. IMPRESSION: Mild diffuse cortical atrophy. Mild chronic ischemic white matter disease. 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 No acute intracranial abnormality seen. Electronically Signed   By: Marijo Conception, M.D.   On: 06/03/2015 18:09   Ct Abdomen Pelvis W Contrast  06/03/2015  CLINICAL DATA:  Rectal pain for 1 week. Recent diagnosis of fecal impaction EXAM: CT ABDOMEN AND PELVIS WITH CONTRAST TECHNIQUE: Multidetector CT imaging of the abdomen and pelvis was performed using the standard protocol following bolus administration of intravenous contrast. CONTRAST:  52mL OMNIPAQUE IOHEXOL 300 MG/ML SOLN, 11mL OMNIPAQUE IOHEXOL 300 MG/ML SOLN COMPARISON:  CT 01/23/2015 FINDINGS: Lower chest: Lung bases are clear. Hepatobiliary: RIGHT hepatic lobe cyst again noted no biliary duct dilatation. Gallbladder normal. Common bile duct normal Pancreas: Pancreas is normal. No ductal dilatation. No pancreatic inflammation. Spleen: Normal spleen Adrenals/urinary tract: Adrenal glands normal. There bilateral simple fluid attenuation rounded renal lesions. No obstructive uropathy. Bladder normal. Stomach/Bowel: Stomach, small bowel, appendix, and cecum are normal. Several diverticula of the descending colon sigmoid colon. No significant stool burden in the rectum. There  is a thickening most distal rectum / anus to 13 mm (image 70, series 2) Vascular/Lymphatic: Abdominal aorta is normal caliber with atherosclerotic calcification. There is no retroperitoneal or periportal lymphadenopathy. No pelvic lymphadenopathy. Reproductive: Post hysterectomy. Other: No free fluid. Musculoskeletal: No aggressive osseous lesion. IMPRESSION: 1. No significant stool burden in the rectum. 2. Thickening of the anus vs distal rectum. Cannot exclude distal proctitis. Majority of the rectum is normal. 3. Mild to moderate sigmoid diverticulosis without diverticulitis. Electronically Signed   By: Suzy Bouchard M.D.   On: 06/03/2015 14:47    Scheduled Meds: . aspirin  81 mg Oral Daily  . enoxaparin (LOVENOX) injection  40 mg Subcutaneous Q24H  . losartan  25 mg Oral Daily  . multivitamin with minerals  1 tablet Oral Daily  . polyethylene glycol  17 g Oral Daily  . QUEtiapine  25 mg Oral QHS   Continuous Infusions: . dextrose 5 % and 0.45 % NaCl with KCl 10 mEq/L 75 mL/hr at 06/03/15 2327       Time spent: 35 minutes    [facility]  [facility] Pager [phone] If 7PM-7AM, please contact night-coverage at [email or web address], password [facility] 06/04/2015, 10:42 AM

## 2015-06-04 NOTE — Progress Notes (Signed)
Pt arrived to unit at approximately 1830, room 1502.Marland Kitchen Alert and oriented x2 with some confusion and forgetfulness. Bed alarm on. VS taken. Pt oriented to room and callbell. Gait unsteady with general weakness. C/o abdominal pain to L quadrant. Will continue to monitor pt.

## 2015-06-04 NOTE — Progress Notes (Signed)
[facility] PROGRESS NOTE   Kathryn Whitaker [id number] DOB: 03-31-1920 DOA: 06/03/2015 PCP: Hoyt Koch, MD  HPI/Subjective: She will watch was eating her breakfast this morning, feels much better. Had bowel movement last night, still complaining about pain. SNF placement, CSW to see.  Assessment/Plan: Principal Problem:   Rectal pain Active Problems:   HTN (hypertension)   COPD (chronic obstructive pulmonary disease) (HCC)   Protein-calorie malnutrition, severe (HCC)   Delirium   Rectal pain Probable proctitis according to the CT scan, no evidence of impaction. According to their story patient had recent stool impaction treated with an enema at [facility]. Proctitis could be stercoral ulcer or just simply irritation from enemas. Provide MiraLAX and Tylenol for pain control.  Failure to thrive According to the records for the past year patient lost about 14 pounds. Patient mentioned loss of appetite and difficulty swallowing. We'll ask SLP for evaluation. Meanwhile soft food, patient does not have upper dentition.  Delirium Per EDP report, patient was not delirious to me, head CT pending. No evidence of UTI or other infection.  Severe protein energy malnutrition Lower albumin, loss of appetite and weight loss. RD to see.  Pericolonic mass There is 3.2 cm complex structure adjacent to the descending colon, seen in CT scan on August 2016. According to the CT it possible to represent low-grade malignancy. No mention in the CT scan from earlier today about any perirectal masses. This is discussed with granddaughter at bedside, family is aware about the possible malignancy, because of the advanced age no further workup.  Hypokalemia Potassium of 3.2, replete with oral supplements. Check BMP in a.m.  Code Status: Full Code Family Communication: Plan discussed with the patient. Disposition Plan: Remains inpatient Diet: DIET SOFT Room service  appropriate?: Yes; Fluid consistency:: Thin  Consultants:  None  Procedures:  None  Antibiotics:  None   Objective: Filed Vitals:   06/04/15 0456 06/04/15 1340  BP: 96/60 101/63  Pulse: 77 84  Temp: 98.5 F (36.9 C) 97.6 F (36.4 C)  Resp: 18 16    Intake/Output Summary (Last 24 hours) at 06/04/15 1525 Last data filed at 06/04/15 0900  Gross per 24 hour  Intake    600 ml  Output      0 ml  Net    600 ml   There were no vitals filed for this visit.  Exam: General: Alert and awake, oriented x3, not in any acute distress. HEENT: anicteric sclera, pupils reactive to light and accommodation, EOMI CVS: S1-S2 clear, no murmur rubs or gallops Chest: clear to auscultation bilaterally, no wheezing, rales or rhonchi Abdomen: soft nontender, nondistended, normal bowel sounds, no organomegaly Extremities: no cyanosis, clubbing or edema noted bilaterally Neuro: Cranial nerves II-XII intact, no focal neurological deficits  Data Reviewed: Basic Metabolic Panel:  Recent Labs Lab 06/03/15 0956 06/04/15 0529  NA 143 139  K 3.3* 3.2*  CL 104 103  CO2 30 30  GLUCOSE 86 180*  BUN 19 18  CREATININE 0.70 0.75  CALCIUM 9.4 9.1   Liver Function Tests:  Recent Labs Lab 06/03/15 0956  AST 19  ALT 12*  ALKPHOS 54  BILITOT 1.4*  PROT 6.7  ALBUMIN 3.4*   No results for input(s): LIPASE, AMYLASE in the last 168 hours. No results for input(s): AMMONIA in the last 168 hours. CBC:  Recent Labs Lab 06/03/15 0956 06/04/15 0529  WBC 4.2 5.0  NEUTROABS 3.1  --   HGB 13.7 11.4*  HCT 42.4  35.4*  MCV 94.6 91.9  PLT 201 184   Cardiac Enzymes: No results for input(s): CKTOTAL, CKMB, CKMBINDEX, TROPONINI in the last 168 hours. BNP (last 3 results) No results for input(s): BNP in the last 8760 hours.  ProBNP (last 3 results) No results for input(s): PROBNP in the last 8760 hours.  CBG: No results for input(s): GLUCAP in the last 168 hours.  Micro No results found  for this or any previous visit (from the past 240 hour(s)).   Studies: Ct Head Wo Contrast  06/03/2015  CLINICAL DATA:  Increased confusion. EXAM: CT HEAD WITHOUT CONTRAST TECHNIQUE: Contiguous axial images were obtained from the base of the skull through the vertex without intravenous contrast. COMPARISON:  CT scan of April 10, 2009. FINDINGS: Bony calvarium appears intact. Mild diffuse cortical atrophy is noted. Mild chronic ischemic white matter disease is noted. Old right posterior cerebellar infarction is noted. No mass effect or midline shift is noted. Ventricular size is within normal limits. There is no evidence of mass lesion, hemorrhage or acute infarction. IMPRESSION: Mild diffuse cortical atrophy. Mild chronic ischemic white matter disease. 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 No acute intracranial abnormality seen. Electronically Signed   By: Marijo Conception, M.D.   On: 06/03/2015 18:09   Ct Abdomen Pelvis W Contrast  06/03/2015  CLINICAL DATA:  Rectal pain for 1 week. Recent diagnosis of fecal impaction EXAM: CT ABDOMEN AND PELVIS WITH CONTRAST TECHNIQUE: Multidetector CT imaging of the abdomen and pelvis was performed using the standard protocol following bolus administration of intravenous contrast. CONTRAST:  48mL OMNIPAQUE IOHEXOL 300 MG/ML SOLN, 69mL OMNIPAQUE IOHEXOL 300 MG/ML SOLN COMPARISON:  CT 01/23/2015 FINDINGS: Lower chest: Lung bases are clear. Hepatobiliary: RIGHT hepatic lobe cyst again noted no biliary duct dilatation. Gallbladder normal. Common bile duct normal Pancreas: Pancreas is normal. No ductal dilatation. No pancreatic inflammation. Spleen: Normal spleen Adrenals/urinary tract: Adrenal glands normal. There bilateral simple fluid attenuation rounded renal lesions. No obstructive uropathy. Bladder normal. Stomach/Bowel: Stomach, small bowel, appendix, and cecum are normal. Several diverticula of the descending colon sigmoid colon. No significant stool burden in  the rectum. There is a thickening most distal rectum / anus to 13 mm (image 70, series 2) Vascular/Lymphatic: Abdominal aorta is normal caliber with atherosclerotic calcification. There is no retroperitoneal or periportal lymphadenopathy. No pelvic lymphadenopathy. Reproductive: Post hysterectomy. Other: No free fluid. Musculoskeletal: No aggressive osseous lesion. IMPRESSION: 1. No significant stool burden in the rectum. 2. Thickening of the anus vs distal rectum. Cannot exclude distal proctitis. Majority of the rectum is normal. 3. Mild to moderate sigmoid diverticulosis without diverticulitis. Electronically Signed   By: Suzy Bouchard M.D.   On: 06/03/2015 14:47    Scheduled Meds: . aspirin  81 mg Oral Daily  . enoxaparin (LOVENOX) injection  40 mg Subcutaneous Q24H  . losartan  25 mg Oral Daily  . multivitamin with minerals  1 tablet Oral Daily  . polyethylene glycol  17 g Oral Daily  . QUEtiapine  25 mg Oral QHS   Continuous Infusions: . dextrose 5 % and 0.45 % NaCl with KCl 10 mEq/L 75 mL/hr at 06/04/15 1155       Time spent: 35 minutes    [facility] A  [facility] Pager [phone] If 7PM-7AM, please contact night-coverage at [email or web address], password [facility] 06/04/2015, 3:25 PM

## 2015-06-05 DIAGNOSIS — R410 Disorientation, unspecified: Secondary | ICD-10-CM | POA: Diagnosis not present

## 2015-06-05 DIAGNOSIS — I10 Essential (primary) hypertension: Secondary | ICD-10-CM | POA: Diagnosis not present

## 2015-06-05 DIAGNOSIS — J42 Unspecified chronic bronchitis: Secondary | ICD-10-CM | POA: Diagnosis not present

## 2015-06-05 DIAGNOSIS — K6289 Other specified diseases of anus and rectum: Secondary | ICD-10-CM | POA: Diagnosis not present

## 2015-06-05 LAB — BASIC METABOLIC PANEL
Anion gap: 6 (ref 5–15)
BUN: 17 mg/dL (ref 6–20)
CHLORIDE: 103 mmol/L (ref 101–111)
CO2: 26 mmol/L (ref 22–32)
CREATININE: 0.67 mg/dL (ref 0.44–1.00)
Calcium: 8.6 mg/dL — ABNORMAL LOW (ref 8.9–10.3)
Glucose, Bld: 122 mg/dL — ABNORMAL HIGH (ref 65–99)
Potassium: 4.3 mmol/L (ref 3.5–5.1)
SODIUM: 135 mmol/L (ref 135–145)

## 2015-06-05 LAB — HEMOGLOBIN A1C
Hgb A1c MFr Bld: 5.1 % (ref 4.8–5.6)
Mean Plasma Glucose: 100 mg/dL

## 2015-06-05 LAB — CBC
HCT: 33.3 % — ABNORMAL LOW (ref 36.0–46.0)
Hemoglobin: 10.9 g/dL — ABNORMAL LOW (ref 12.0–15.0)
MCH: 30.5 pg (ref 26.0–34.0)
MCHC: 32.7 g/dL (ref 30.0–36.0)
MCV: 93.3 fL (ref 78.0–100.0)
PLATELETS: 177 10*3/uL (ref 150–400)
RBC: 3.57 MIL/uL — AB (ref 3.87–5.11)
RDW: 13.4 % (ref 11.5–15.5)
WBC: 5.1 10*3/uL (ref 4.0–10.5)

## 2015-06-05 MED ORDER — QUETIAPINE FUMARATE 25 MG PO TABS: 25.0000 mg | ORAL_TABLET | Freq: Every day | ORAL | Status: DC

## 2015-06-05 MED ORDER — BOOST / RESOURCE BREEZE PO LIQD
1.0000 | Freq: Two times a day (BID) | ORAL | Status: DC
Start: 2015-06-05 — End: 2015-06-06
  Administered 2015-06-06: 1 via ORAL

## 2015-06-05 NOTE — Progress Notes (Signed)
Bed available for patient at [facility] however, patient's son has refused bed offer. Son stated, "I am not sign any information for her to go to another facility". "[facility] said they have a bed available for her tomorrow, and that is where she will be going". CSW followed up to inform facility representative at [facility]. CSW to discuss case with CM. Patient expected to discharge to [facility] tomorrow.   Lucius Conn, [facility] Ph: [phone]

## 2015-06-05 NOTE — Discharge Summary (Signed)
Physician Discharge Summary  Kathryn Whitaker [id number] DOB: Jul 29, 1919 DOA: 06/03/2015  PCP: Hoyt Koch, MD  Admit date: 06/03/2015 Discharge date: 06/05/2015  Time spent: 40 minutes  Recommendations for Outpatient Follow-up:  1. Follow up with nursing home M.D.   Discharge Diagnoses:  Principal Problem:   Rectal pain Active Problems:   HTN (hypertension)   COPD (chronic obstructive pulmonary disease) (HCC)   Protein-calorie malnutrition, severe ([place])   Delirium   Discharge Condition: Stable  Diet recommendation: Heart healthy  There were no vitals filed for this visit.  History of present illness:  Kathryn Whitaker is a 81 y.o. female with past medical history of GERD, stroke and anxiety brought to the hospital by her family because of rectal pain. 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 For the past several weeks there approaching her primary care physician to get her to SNF. Patient mentioned that she has some rectal pain for the past several days, [facility], given an enema and discharged home. He couldn't get the records from there but this is what the granddaughter at bedside told me. Patient was still complaining about the rectal pain so she came in to the hospital for further evaluation. Patient also complained about difficulty swallowing medications. CT in the ED showed possible proctitis, while she was in the emergency department EDP mentioned some delirium and altered mental status. To me she was very articulate, did not have any hallucination provided most of the history.  Hospital Course:   Rectal pain Probable proctitis according to the CT scan, no evidence of impaction. According to their story patient had recent stool impaction treated with an enema at [facility]. Proctitis could be stercoral ulcer or just simply irritation from  enemas. Provide MiraLAX and Tylenol for pain control.  Failure to thrive According to the records for the past year patient lost about 14 pounds. Patient mentioned loss of appetite and difficulty swallowing. SOB evaluation requested, NOT done to all the time of discharge, patient tolerating soft diet, continue.  Delirium Per EDP report, patient was not delirious to me, head CT negative. No evidence of UTI or other infection.  Severe protein energy malnutrition Lower albumin, loss of appetite and weight loss. RD to see.  Pericolonic mass There is 3.2 cm complex structure adjacent to the descending colon, seen in CT scan on August 2016. According to the CT it possible to represent low-grade malignancy. No mention in the CT scan from earlier today about any perirectal masses. This is discussed with granddaughter at bedside, family is aware about the possible malignancy, because of the advanced age no further workup.  Hypokalemia Potassium of 3.2, repleted with oral supplements.    Procedures:  None  Consultations:  None  Discharge Exam: Filed Vitals:   06/04/15 2256 06/05/15 0656  BP: 108/70 108/67  Pulse: 74 85  Temp:  99.1 F (37.3 C)  Resp:  18   General: Alert and awake, oriented x3, not in any acute distress. HEENT: anicteric sclera, pupils reactive to light and accommodation, EOMI CVS: S1-S2 clear, no murmur rubs or gallops Chest: clear to auscultation bilaterally, no wheezing, rales or rhonchi Abdomen: soft nontender, nondistended, normal bowel sounds, no organomegaly Extremities: no cyanosis, clubbing or edema noted bilaterally Neuro: Cranial nerves II-XII intact, no focal neurological deficits  Discharge Instructions   Discharge Instructions    Diet - low sodium  heart healthy    Complete by:  As directed      Increase activity slowly    Complete by:  As directed           Current Discharge Medication List    CONTINUE these medications which have  NOT CHANGED   Details  albuterol (PROVENTIL HFA) 108 (90 BASE) MCG/ACT inhaler Inhale 2 puffs into the lungs every 6 (six) hours as needed. For shortness of breath Qty: 1 Inhaler, Refills: 0    aspirin 81 MG tablet Take 81 mg by mouth daily.    losartan (COZAAR) 25 MG tablet Take 1 tablet (25 mg total) by mouth daily. Qty: 60 tablet, Refills: 1    Multiple Vitamins-Minerals (MULTIVITAMIN WITH MINERALS) tablet Take 1 tablet by mouth daily.    polyethylene glycol (MIRALAX / GLYCOLAX) packet Take 17 g by mouth daily.    Propylene Glycol (SYSTANE BALANCE OP) Apply 1 application to eye as needed (for dry eyes).     acetaminophen (TYLENOL) 325 MG tablet Take 2 tablets (650 mg total) by mouth every 6 (six) hours as needed.    docusate sodium (COLACE) 100 MG capsule Take 1 capsule (100 mg total) by mouth every 12 (twelve) hours. Qty: 60 capsule, Refills: 0    levofloxacin (LEVAQUIN) 500 MG tablet Take 500 mg by mouth daily. For 7 days    metoprolol succinate (TOPROL XL) 25 MG 24 hr tablet Take 1 tablet (25 mg total) by mouth daily. Qty: 30 tablet, Refills: 1       Allergies  Allergen Reactions  . Demerol [Meperidine]     Pt states she can't remember type of reaction  . Influenza Vaccines     Pt can't remember reaction but states allergic and has not had in 25 years  . Peanuts [Peanut Oil]     Pt reports she is allergic to peanuts, but was asking to eat peanut butter. Pt unsure of her reaction.   [name] [Amoxicill-Clarithro-Lansopraz] Hives    .Marland KitchenHas patient had a PCN reaction causing immediate rash, facial/tongue/throat swelling, SOB or lightheadedness with hypotension: unknown Has patient had a PCN reaction causing severe rash involving mucus membranes or skin necrosis: unknown Has patient had a PCN reaction that required hospitalization unknown Has patient had a PCN reaction occurring within the last 10 years: unknown If all of the above answers are "NO", then may proceed with  Cephalosporin use.   . Telithromycin Hives    REACTION: pt states HIVES....   Follow-up Information    Follow up with Hoyt Koch, MD In 1 week.   Specialty:  Internal Medicine   Contact information:   [place] [id number] [phone]        The results of significant diagnostics from this hospitalization (including imaging, microbiology, ancillary and laboratory) are listed below for reference.    Significant Diagnostic Studies: Ct Head Wo Contrast  06/03/2015  CLINICAL DATA:  Increased confusion. EXAM: CT HEAD WITHOUT CONTRAST TECHNIQUE: Contiguous axial images were obtained from the base of the skull through the vertex without intravenous contrast. COMPARISON:  CT scan of April 10, 2009. FINDINGS: Bony calvarium appears intact. Mild diffuse cortical atrophy is noted. Mild chronic ischemic white matter disease is noted. Old right posterior cerebellar infarction is noted. No mass effect or midline shift is noted. Ventricular size is within normal limits. There is no evidence of mass lesion, hemorrhage or acute infarction. IMPRESSION: Mild diffuse cortical atrophy. Mild chronic ischemic white matter  disease. 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 No acute intracranial abnormality seen. Electronically Signed   By: Marijo Conception, M.D.   On: 06/03/2015 18:09   Ct Abdomen Pelvis W Contrast  06/03/2015  CLINICAL DATA:  Rectal pain for 1 week. Recent diagnosis of fecal impaction EXAM: CT ABDOMEN AND PELVIS WITH CONTRAST TECHNIQUE: Multidetector CT imaging of the abdomen and pelvis was performed using the standard protocol following bolus administration of intravenous contrast. CONTRAST:  29mL OMNIPAQUE IOHEXOL 300 MG/ML SOLN, 82mL OMNIPAQUE IOHEXOL 300 MG/ML SOLN COMPARISON:  CT 01/23/2015 FINDINGS: Lower chest: Lung bases are clear. Hepatobiliary: RIGHT hepatic lobe cyst again noted no biliary duct dilatation. Gallbladder normal. Common bile duct normal  Pancreas: Pancreas is normal. No ductal dilatation. No pancreatic inflammation. Spleen: Normal spleen Adrenals/urinary tract: Adrenal glands normal. There bilateral simple fluid attenuation rounded renal lesions. No obstructive uropathy. Bladder normal. Stomach/Bowel: Stomach, small bowel, appendix, and cecum are normal. Several diverticula of the descending colon sigmoid colon. No significant stool burden in the rectum. There is a thickening most distal rectum / anus to 13 mm (image 70, series 2) Vascular/Lymphatic: Abdominal aorta is normal caliber with atherosclerotic calcification. There is no retroperitoneal or periportal lymphadenopathy. No pelvic lymphadenopathy. Reproductive: Post hysterectomy. Other: No free fluid. Musculoskeletal: No aggressive osseous lesion. IMPRESSION: 1. No significant stool burden in the rectum. 2. Thickening of the anus vs distal rectum. Cannot exclude distal proctitis. Majority of the rectum is normal. 3. Mild to moderate sigmoid diverticulosis without diverticulitis. Electronically Signed   By: Suzy Bouchard M.D.   On: 06/03/2015 14:47    Microbiology: No results found for this or any previous visit (from the past 240 hour(s)).   Labs: Basic Metabolic Panel:  Recent Labs Lab 06/03/15 0956 06/04/15 0529 06/05/15 0515  NA 143 139 135  K 3.3* 3.2* 4.3  CL 104 103 103  CO2 30 30 26   GLUCOSE 86 180* 122*  BUN 19 18 17   CREATININE 0.70 0.75 0.67  CALCIUM 9.4 9.1 8.6*   Liver Function Tests:  Recent Labs Lab 06/03/15 0956  AST 19  ALT 12*  ALKPHOS 54  BILITOT 1.4*  PROT 6.7  ALBUMIN 3.4*   No results for input(s): LIPASE, AMYLASE in the last 168 hours. No results for input(s): AMMONIA in the last 168 hours. CBC:  Recent Labs Lab 06/03/15 0956 06/04/15 0529 06/05/15 0515  WBC 4.2 5.0 5.1  NEUTROABS 3.1  --   --   HGB 13.7 11.4* 10.9*  HCT 42.4 35.4* 33.3*  MCV 94.6 91.9 93.3  PLT 201 184 177   Cardiac Enzymes: No results for input(s):  CKTOTAL, CKMB, CKMBINDEX, TROPONINI in the last 168 hours. BNP: BNP (last 3 results) No results for input(s): BNP in the last 8760 hours.  ProBNP (last 3 results) No results for input(s): PROBNP in the last 8760 hours.  CBG: No results for input(s): GLUCAP in the last 168 hours.     Signed:  Meggen Spaziani A  [facility] 06/05/2015, 10:46 AM

## 2015-06-05 NOTE — Progress Notes (Signed)
CSW followed up on [facility] number and number is still unavailable. CSW contacted Katharine Look from [facility] regarding bed availability on today. Per Katharine Look, there are no beds available today, however she will have a bed available for the patient on tomorrow. CSW will continue to follow up regarding [facility] number, and will continue to provide support to patient while in hospital.   Lucius Conn, [place] Worker [place] [phone]

## 2015-06-05 NOTE — Progress Notes (Signed)
OT Cancellation Note  Patient Details Name: NICLOLE MCBATH MRN: [id number] DOB: 1919/12/15   Cancelled Treatment:    Reason Eval/Treat Not Completed: Fatigue/lethargy limiting ability to participate  Will recheck on pt next day.  Pt had worked with PT prior to OT checking on her  Betsy Pries  06/05/2015  Kari Baars, [place]  06/05/2015, 3:53 PM

## 2015-06-05 NOTE — Progress Notes (Signed)
CSW spoke with son [name] regarding plans for discharge. Son voiced that he has been in contact with facility representative at [facility] and that patient has been accepted there. CSW informed son that CSW must obtain [facility] number before patient is able to discharge to facility. CSW was also updated regarding bed availability at [facility]. Per Katharine Look, she does not have a bed available for patient on today. However, bed will be available for patient on tomorrow. Son is aware of this and would like for the patient to be kept overnight until bed is available. Son stated "I am not able to care for her and I cannot do this on my own". Son currently resides in [place], [place] and is hopeful for placement on tomorrow. CSW acknowledged the son's feelings and provided encouragement. MD came by and spoke with patient's son on today as well. CSW is currently waiting on insurance authorization for patient. CSW will continue to follow and provide support while in the hospital.   Lucius Conn, [place] Worker [place] [phone]

## 2015-06-05 NOTE — Evaluation (Signed)
Physical Therapy Evaluation Patient Details Name: Kathryn Whitaker MRN: [id number] DOB: 01-12-1920 Today's Date: 06/05/2015   History of Present Illness  81 yo female admitted with rectal pain. Hx of HTN, chronic pain, anxiety, IBS.   Clinical Impression  On eval, pt required Mod assist for mobility-performed modified stand pivot x 2, bed<>bsc, and bed level exercises. Pt is weak. Recommend SNF   Follow Up Recommendations SNF    Equipment Recommendations  None recommended by PT    Recommendations for Other Services       Precautions / Restrictions Precautions Precautions: Fall Restrictions Weight Bearing Restrictions: No      Mobility  Bed Mobility Overal bed mobility: Needs Assistance Bed Mobility: Supine to Sit;Sit to Supine     Supine to sit: Mod assist Sit to supine: Mod assist   General bed mobility comments: Assist for trunk and LEs. Increased time. Cues for pt to participate as much as possible  Transfers Overall transfer level: Needs assistance   Transfers: Sit to/from Stand;Stand Pivot Transfers Sit to Stand: Mod assist Stand pivot transfers: Mod assist       General transfer comment: Assist to rise, stabilize, control descent. Modified stand pivot (with use of armrests for support) x 2, bed<>bsc. Increased time.   Ambulation/Gait                Stairs            Wheelchair Mobility    Modified Rankin (Stroke Patients Only)       Balance Overall balance assessment: Needs assistance         Standing balance support: During functional activity Standing balance-Leahy Scale: Poor                               Pertinent Vitals/Pain Pain Assessment: Faces Faces Pain Scale: Hurts little more Pain Location: bottom Pain Descriptors / Indicators: Sore Pain Intervention(s): Monitored during session;Repositioned    Home Living Family/patient expects to be discharged to:: Skilled nursing facility                       Prior Function Level of Independence: Needs assistance   Gait / Transfers Assistance Needed: per pt, she hasn't walked recently due to increased pain           Hand Dominance        Extremity/Trunk Assessment   Upper Extremity Assessment: Defer to OT evaluation           Lower Extremity Assessment: Generalized weakness      Cervical / Trunk Assessment: Kyphotic  Communication   Communication: No difficulties  Cognition Arousal/Alertness: Awake/alert Behavior During Therapy: WFL for tasks assessed/performed Overall Cognitive Status: Within Functional Limits for tasks assessed                      General Comments      Exercises  Ankle pumps, 5 reps, supine  Quad sets, 5 reps, supine Heel Slides, 5 reps, supine, AAROM Hip Abd/Add, 5 reps, supine, AAROM      Assessment/Plan    PT Assessment Patient needs continued PT services  PT Diagnosis Generalized weakness;Acute pain;Difficulty walking   PT Problem List Decreased strength;Decreased activity tolerance;Decreased balance;Decreased mobility;Decreased knowledge of use of DME;Pain  PT Treatment Interventions DME instruction;Gait training;Functional mobility training;Therapeutic activities;Patient/family education;Balance training;Therapeutic exercise   PT Goals (Current goals can be found in the Care Plan  section) Acute Rehab PT Goals Patient Stated Goal: less pain Time For Goal Achievement: 06/19/15 Potential to Achieve Goals: Fair    Frequency Min 2X/week   Barriers to discharge        Co-evaluation               End of Session   Activity Tolerance: Patient limited by fatigue;Patient limited by pain Patient left: in bed;with call bell/phone within reach;with bed alarm set      Functional Assessment Tool Used: clinical judgement Functional Limitation: Mobility: Walking and moving around Mobility: Walking and Moving Around Current Status [id number]): At least 20 percent but less  than 40 percent impaired, limited or restricted Mobility: Walking and Moving Around Goal Status [phone]): At least 1 percent but less than 20 percent impaired, limited or restricted    Time: [id number] PT Time Calculation (min) (ACUTE ONLY): 18 min   Charges:   PT Evaluation $Initial PT Evaluation Tier I: 1 Procedure     PT G Codes:   PT G-Codes **NOT FOR INPATIENT CLASS** Functional Assessment Tool Used: clinical judgement Functional Limitation: Mobility: Walking and moving around Mobility: Walking and Moving Around Current Status [id number]): At least 20 percent but less than 40 percent impaired, limited or restricted Mobility: Walking and Moving Around Goal Status [phone]): At least 1 percent but less than 20 percent impaired, limited or restricted    Weston Anna, MPT Pager: [phone]

## 2015-06-05 NOTE — Evaluation (Signed)
Clinical/Bedside Swallow Evaluation Patient Details  Name: MAKYRA LEIDING MRN: [id number] Date of Birth: 16-Apr-1920  Today's Date: 06/05/2015 Time: SLP Start Time (ACUTE ONLY): 1036 SLP Stop Time (ACUTE ONLY): 1059 SLP Time Calculation (min) (ACUTE ONLY): 23 min  Past Medical History:  Past Medical History  Diagnosis Date  . Allergic rhinitis   . Acute asthmatic bronchitis   . Hypertension   . Hypercholesteremia   . GERD (gastroesophageal reflux disease)   . Diverticulosis of colon   . IBS (irritable bowel syndrome)   . History of colonic polyps   . UTI (lower urinary tract infection)   . DJD (degenerative joint disease)   . Lumbar back pain   . Lacunar infarction ([place])   . Vitamin D deficiency   . Anxiety    Past Surgical History:  Past Surgical History  Procedure Laterality Date  . Abdominal hysterectomy     HPI:  81 year old female admitted with rectal pain, likely proctitis according to CT scan, failure to thrive with loss of appetite and difficulty swallowing, delirium. H/o pericolonic mass as seen on CT scan 01/2015, GERD, CVA, dementia.    Assessment / Plan / Recommendation Clinical Impression  Patient presents with a suspected primary esophageal dysphagia characterized by c/o globus (solids worse than liquids) despite what appears to be a normal oropharyngeal swallow without overt indication of aspiration. Patient with general refusal of pos, requiring max SLP motivation/verbal cueing for increased bite size and continued po intake. Education complete with patient and son regarding results. Suspect that general decline/loss of appetite with dementia are also playing a role. Educated both patient and son on esophageal precautions including alternation of bite and sip of liquids as well as proper positioning for symptom management. Will defer further w/u and/or management of probable esophageal deficits to MD. No SLP f/u indicated at this time. Son verbalizes understanding.       Aspiration Risk  Mild aspiration risk;Risk for inadequate nutrition/hydration    Diet Recommendation   Dysphagia 3, thin liquid Small bites and sips Follow solid with liquid Upright 90 degrees during and for 30 min after meals.   Medication Administration: Whole meds with liquid (may crush if needed)    Other  Recommendations Oral Care Recommendations: Oral care BID   Follow up Recommendations  Skilled Nursing facility (possible brief SNF f/u to establish correct diet)               Swallow Study   General HPI: 81 year old female admitted with rectal pain, likely proctitis according to CT scan, failure to thrive with loss of appetite and difficulty swallowing, delirium. H/o pericolonic mass as seen on CT scan 01/2015, GERD, CVA, dementia.  Type of Study: Bedside Swallow Evaluation Previous Swallow Assessment: none noted Diet Prior to this Study: Dysphagia 3 (soft);Thin liquids Temperature Spikes Noted: No Respiratory Status: Room air History of Recent Intubation: No Behavior/Cognition: Alert Oral Cavity Assessment: Within Functional Limits Oral Care Completed by SLP: No Oral Cavity - Dentition: Missing dentition;Poor condition Vision: Functional for self-feeding Self-Feeding Abilities: Able to feed self Patient Positioning: Upright in bed Baseline Vocal Quality: Normal Volitional Cough: Weak Volitional Swallow: Able to elicit    Oral/Motor/Sensory Function Overall Oral Motor/Sensory Function: Generalized oral weakness   Ice Chips Ice chips: Not tested   Thin Liquid Thin Liquid: Within functional limits Presentation: Cup;Self Fed;Straw    Nectar Thick Nectar Thick Liquid: Not tested   Honey Thick Honey Thick Liquid: Not tested   Puree  Puree: Within functional limits Presentation: Spoon   Solid Solid: Within functional limits      Gabriel Rainwater MA, CCC-SLP [phone]  Duanna Runk Meryl 06/05/2015,11:07 AM

## 2015-06-05 NOTE — Progress Notes (Signed)
Initial Nutrition Assessment  DOCUMENTATION CODES:   Severe malnutrition in context of chronic illness  INTERVENTION:  - Will order Boost Breeze BID, each supplement provides 250 kcal and 9 grams of protein - Assistance with meals as needed - RD will continue to monitor for needs  NUTRITION DIAGNOSIS:   Biting/chewing difficulty related to other (see comment) (poor dentition) as evidenced by other (see comment) (pt report, no upper teeth).  GOAL:   Patient will meet greater than or equal to 90% of their needs  MONITOR:   PO intake, Supplement acceptance, Weight trends, Labs, I & O's  REASON FOR ASSESSMENT:   Consult Assessment of nutrition requirement/status  ASSESSMENT:   81 y.o. female with past medical history of GERD, stroke and anxiety brought to the hospital by her family because of rectal pain. 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 For the past several weeks there approaching her primary care physician to get her to SNF. Patient mentioned that she has some rectal pain for the past several days, [facility], given an enema and discharged home. He couldn't get the records from there but this is what the granddaughter at bedside told me. Patient was still complaining about the rectal pain so she came in to the hospital for further evaluation. Patient also complained about difficulty swallowing medications.  Pt seen for consult. No new weight since 04/06/15; this weight used to calculate nutrition needs and BMI. BMI indicates normal weight status. Per chart review, pt ate 95% of breakfast, 75% of lunch, and 65% of dinner last night. Pt reports having a few bites of eggs, applesauce, and bagel with cream cheese and jelly this AM. She denies abdominal pain or nausea with intakes this AM or PTA. She states she does have some difficulty with chewing with no upper  teeth; softer foods are better tolerated by pt due to this.   She states that she had previously been drinking Ensure or Boost but that she stopped drinking them as she was told she was allergic to something in these supplements. When asked about this pt states she was told that she was allergic to the sodium in these supplements. To increase acceptance of supplements, asked pt if she would be willing to try a clear liquid supplement [facility]) and pt agreeable. Will order this BID.   Severe and moderate muscle wasting and severe and moderate fat wasting noted to upper body with lower body being WDL. Chart review indicates 10 lb weight loss from 06/27/14-04/06/15. This indicates 10 lbs weight loss (8.6% body weight) in 10 months which is not significant for time frame. She reports good appetite PTA and eating 3-4 small meals/day.  Pt likely meeting minimal needs on average. Medications reviewed. Labs reviewed; Ca: 8.6 mg/dL.   Diet Order:  DIET SOFT Room service appropriate?: Yes; Fluid consistency:: Thin  Skin:  Reviewed, no issues  Last BM:  12/11  Height:   Ht Readings from Last 1 Encounters:  04/06/15 5\' 3"  (1.6 m)    Weight:   Wt Readings from Last 1 Encounters:  04/06/15 106 lb (48.081 kg)    Ideal Body Weight:  52.27 kg (kg)  BMI:  18.78 kg/m2  Estimated Nutritional Needs:   Kcal:  1000-1200  Protein:  48-55 grams  Fluid:  1.8-2 L/day  EDUCATION NEEDS:   No education needs identified at this time  Jarome Matin, RD, LDN Inpatient Clinical Dietitian Pager # [phone] After hours/weekend pager # [phone]

## 2015-06-06 DIAGNOSIS — K6289 Other specified diseases of anus and rectum: Secondary | ICD-10-CM | POA: Diagnosis not present

## 2015-06-06 DIAGNOSIS — E039 Hypothyroidism, unspecified: Secondary | ICD-10-CM | POA: Diagnosis present

## 2015-06-06 LAB — CBC
HCT: 33.8 % — ABNORMAL LOW (ref 36.0–46.0)
Hemoglobin: 10.7 g/dL — ABNORMAL LOW (ref 12.0–15.0)
MCH: 30 pg (ref 26.0–34.0)
MCHC: 31.7 g/dL (ref 30.0–36.0)
MCV: 94.7 fL (ref 78.0–100.0)
Platelets: 164 10*3/uL (ref 150–400)
RBC: 3.57 MIL/uL — AB (ref 3.87–5.11)
RDW: 13.3 % (ref 11.5–15.5)
WBC: 3.5 10*3/uL — ABNORMAL LOW (ref 4.0–10.5)

## 2015-06-06 LAB — BASIC METABOLIC PANEL
Anion gap: 4 — ABNORMAL LOW (ref 5–15)
BUN: 13 mg/dL (ref 6–20)
CHLORIDE: 106 mmol/L (ref 101–111)
CO2: 27 mmol/L (ref 22–32)
CREATININE: 0.57 mg/dL (ref 0.44–1.00)
Calcium: 8.9 mg/dL (ref 8.9–10.3)
GFR calc Af Amer: >60 mL/min (ref >60)
GFR calc non Af Amer: >60 mL/min (ref >60)
GLUCOSE: 78 mg/dL (ref 65–99)
Potassium: 4.8 mmol/L (ref 3.5–5.1)
Sodium: 137 mmol/L (ref 135–145)

## 2015-06-06 MED ORDER — LEVOTHYROXINE SODIUM 50 MCG PO TABS: 50.0000 ug | ORAL_TABLET | Freq: Every day | ORAL | Status: DC

## 2015-06-06 NOTE — Progress Notes (Signed)
CSW assisting with d/c planning. [employer] medicare will not provide authorization for [facility] without a peer to peer review. Insurance feels pt is requiring custodial care not rehab. MD contacted and has declined peer to peer review. CSW has met with pt's son and he has accepted SNF bed at [facility]. [facility] will provide LOG. CSW has contacted [place] to confirm d/c plan. Awaiting return call . CSW will continue to follow to assist with d/c planning to SNF.  Werner Lean LCSW [phone]

## 2015-06-06 NOTE — Evaluation (Signed)
Occupational Therapy Evaluation Patient Details Name: Kathryn Whitaker MRN: [id number] DOB: Jun 19, 1920 Today's Date: 06/06/2015    History of Present Illness 81 yo female admitted with rectal pain. Hx of HTN, chronic pain, anxiety, IBS.    Clinical Impression   Plan is for patient to discharge > SNF. No acute OT needs identified, all needs can be met in SNF. Please send text page to OT services if any questions, concerns, or with new orders: [phone] OR call office at [phone]. Thank you for the order.      Follow Up Recommendations  SNF;Supervision/Assistance - 24 hour    Equipment Recommendations  Other (comment) (TBD)    Recommendations for Other Services  None at this time   Precautions / Restrictions Precautions Precautions: Fall Restrictions Weight Bearing Restrictions: No      Mobility Bed Mobility Overal bed mobility: Needs Assistance Bed Mobility: Supine to Sit     Supine to sit: Mod assist     General bed mobility comments: Mod assist for management of BLEs and trunk assist into sitting. Cues for initiation and sequencing.   Transfers Overall transfer level: Needs assistance Equipment used: Rolling walker (2 wheeled) Transfers: Sit to/from [facility] Sit to Stand: Mod assist Stand pivot transfers: Mod assist       General transfer comment: Pt transferred EOB to BSC, BSC to recliner. multimodal cueing for initiation, sequencing, hand placement, and safety    Balance Overall balance assessment: Needs assistance Sitting-balance support: No upper extremity supported;Feet supported Sitting balance-Leahy Scale: Fair     Standing balance support: Bilateral upper extremity supported;During functional activity Standing balance-Leahy Scale: Poor    ADL Overall ADL's : Needs assistance/impaired Eating/Feeding: Set up;Sitting   Grooming: Set up;Sitting   Upper Body Bathing: Minimal assitance;Sitting   Lower Body Bathing:  Moderate assistance;Sit to/from stand   Upper Body Dressing : Minimal assistance;Sitting   Lower Body Dressing: Moderate assistance;Sit to/from stand   Toilet Transfer: Minimal assistance;RW;BSC;Stand-pivot   Toileting- Water quality scientist and Hygiene: Sit to/from stand;Minimal assistance       Functional mobility during ADLs: Minimal assistance;Cueing for safety;Rolling walker General ADL Comments: Pt required multimodal cueing for safety with RW during transfers and sit to/from stands.                Pertinent Vitals/Pain Pain Assessment: Faces Faces Pain Scale: Hurts little more Pain Location: bottom Pain Descriptors / Indicators: Sore Pain Intervention(s): Monitored during session;Repositioned     Hand Dominance Right   Extremity/Trunk Assessment Upper Extremity Assessment Upper Extremity Assessment: Generalized weakness   Lower Extremity Assessment Lower Extremity Assessment: Generalized weakness   Cervical / Trunk Assessment Cervical / Trunk Assessment: Kyphotic   Communication Communication Communication: No difficulties   Cognition Arousal/Alertness: Awake/alert Behavior During Therapy: WFL for tasks assessed/performed Overall Cognitive Status: Within Functional Limits for tasks assessed             Home Living Family/patient expects to be discharged to:: Skilled nursing facility Living Arrangements: Children (lives with son)   Prior Functioning/Environment Level of Independence: Needs assistance  Gait / Transfers Assistance Needed: per pt, she hasn't walked recently due to increased pain ADL's / Homemaking Assistance Needed: Pt reports her son assists with IADLs and her that she has an aid or son assist with ADLs        OT Diagnosis: Generalized weakness;Acute pain   OT Problem List:  N/a, no acute OT needs identified. All needs can be met in SNF.  OT Treatment/Interventions:   N/a, no acute OT needs identified. All needs can be met in  SNF.   OT Goals(Current goals can be found in the care plan section) Acute Rehab OT Goals Patient Stated Goal: less pain OT Goal Formulation: With patient (all goals can be met in next venue of care)  OT Frequency:   N/a, no acute OT needs identified. All needs can be met in SNF.   Barriers to D/C:  None known at this time   End of Session Equipment Utilized During Treatment: Gait belt;Rolling walker  Activity Tolerance: Patient tolerated treatment well Patient left: in chair;with call bell/phone within reach;with chair alarm set   Time: [phone] OT Time Calculation (min): 24 min Charges:  OT General Charges $OT Visit: 1 Procedure OT Evaluation $Initial OT Evaluation Tier I: 1 Procedure OT Treatments $Self Care/Home Management : 8-22 mins  Kamron Vanwyhe , MS, OTR/L, CLT Pager: [phone]  06/06/2015, 9:37 AM

## 2015-06-06 NOTE — Discharge Summary (Addendum)
Physician Discharge Summary  Kathryn Whitaker [id number] DOB: 08/12/1919 DOA: 06/03/2015  PCP: Kathryn Koch, MD  Admit date: 06/03/2015 Discharge date: 06/06/2015  Time spent: 40 minutes  Recommendations for Outpatient Follow-up:  1. Follow up with nursing home M.D.   Discharge Diagnoses:  Principal Problem:   Rectal pain Active Problems:   HTN (hypertension)   COPD (chronic obstructive pulmonary disease) (HCC)   Protein-calorie malnutrition, severe (HCC)   Delirium   Hypothyroidism   Discharge Condition: Stable  Diet recommendation: Dysphagia 3 diet with thin liquids  There were no vitals filed for this visit.  History of present illness:  Kathryn Whitaker is a 81 y.o. female with past medical history of GERD, stroke and anxiety brought to the hospital by her family because of rectal pain. 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 For the past several weeks there approaching her primary care physician to get her to SNF. Patient mentioned that she has some rectal pain for the past several days, she was seen [facility] diagnosed with fecal impaction, given an enema and discharged home. He couldn't get the records from there but this is what the granddaughter at bedside told me. Patient was still complaining about the rectal pain so she came in to the hospital for further evaluation. Patient also complained about difficulty swallowing medications. CT in the ED showed possible proctitis, while she was in the emergency department EDP mentioned some delirium and altered mental status. To me she was very articulate, did not have any hallucination provided most of the history.  Hospital Course:   Rectal pain Probable proctitis according to the CT scan, no evidence of impaction. According to their story patient had recent stool impaction treated with an enema at [facility]. Proctitis could be stercoral  ulcer or just simply irritation from enemas. Provide MiraLAX and Tylenol for pain control.  Failure to thrive According to the records for the past year patient lost about 14 pounds. Patient mentioned loss of appetite and difficulty swallowing. SLP evaluated the patient recommended dysphagia 3 with thin liquids.  Delirium Per EDP report, patient was not delirious to me, head CT negative. No evidence of UTI or other infection.  Severe protein energy malnutrition Lower albumin, loss of appetite and weight loss. RD to see.  Pericolonic mass There is 3.2 cm complex structure adjacent to the descending colon, seen in CT scan on August 2016. According to the CT it possible to represent low-grade malignancy. No mention in the CT scan from earlier today about any perirectal masses. This is discussed with granddaughter at bedside, family is aware about the possible malignancy, because of the advanced age no further workup.  Hypokalemia Potassium of 3.2, repleted with oral supplements.   Hypothyroidism TSH of 10.652, not taking Synthroid previously, started on 50 g daily   Procedures:  None  Consultations:  None  Discharge Exam: Filed Vitals:   06/06/15 0507 06/06/15 1410  BP: 91/51 124/67  Pulse: 63 66  Temp: 97.9 F (36.6 C) 97.6 F (36.4 C)  Resp: 18 18   General: Alert and awake, oriented x3, not in any acute distress. HEENT: anicteric sclera, pupils reactive to light and accommodation, EOMI CVS: S1-S2 clear, no murmur rubs or gallops Chest: clear to auscultation bilaterally, no wheezing, rales or rhonchi Abdomen: soft nontender, nondistended, normal bowel sounds, no organomegaly Extremities: no cyanosis, clubbing or edema noted bilaterally Neuro: Cranial nerves II-XII intact,  no focal neurological deficits  Discharge Instructions   Discharge Instructions    Diet - low sodium heart healthy    Complete by:  As directed      Increase activity slowly    Complete  by:  As directed           Current Discharge Medication List    START taking these medications   Details  levothyroxine (SYNTHROID, LEVOTHROID) 50 MCG tablet Take 1 tablet (50 mcg total) by mouth daily before breakfast.      CONTINUE these medications which have NOT CHANGED   Details  albuterol (PROVENTIL HFA) 108 (90 BASE) MCG/ACT inhaler Inhale 2 puffs into the lungs every 6 (six) hours as needed. For shortness of breath Qty: 1 Inhaler, Refills: 0    aspirin 81 MG tablet Take 81 mg by mouth daily.    losartan (COZAAR) 25 MG tablet Take 1 tablet (25 mg total) by mouth daily. Qty: 60 tablet, Refills: 1    Multiple Vitamins-Minerals (MULTIVITAMIN WITH MINERALS) tablet Take 1 tablet by mouth daily.    polyethylene glycol (MIRALAX / GLYCOLAX) packet Take 17 g by mouth daily.    Propylene Glycol (SYSTANE BALANCE OP) Apply 1 application to eye as needed (for dry eyes).     acetaminophen (TYLENOL) 325 MG tablet Take 2 tablets (650 mg total) by mouth every 6 (six) hours as needed.    docusate sodium (COLACE) 100 MG capsule Take 1 capsule (100 mg total) by mouth every 12 (twelve) hours. Qty: 60 capsule, Refills: 0    levofloxacin (LEVAQUIN) 500 MG tablet Take 500 mg by mouth daily. For 7 days    metoprolol succinate (TOPROL XL) 25 MG 24 hr tablet Take 1 tablet (25 mg total) by mouth daily. Qty: 30 tablet, Refills: 1       Allergies  Allergen Reactions  . Demerol [Meperidine]     Pt states she can't remember type of reaction  . Influenza Vaccines     Pt can't remember reaction but states allergic and has not had in 25 years  . Peanuts [Peanut Oil]     Pt reports she is allergic to peanuts, but was asking to eat peanut butter. Pt unsure of her reaction.   [name] [Amoxicill-Clarithro-Lansopraz] Hives    .Marland KitchenHas patient had a PCN reaction causing immediate rash, facial/tongue/throat swelling, SOB or lightheadedness with hypotension: unknown Has patient had a PCN reaction  causing severe rash involving mucus membranes or skin necrosis: unknown Has patient had a PCN reaction that required hospitalization unknown Has patient had a PCN reaction occurring within the last 10 years: unknown If all of the above answers are "NO", then may proceed with Cephalosporin use.   . Telithromycin Hives    REACTION: pt states HIVES....   Follow-up Information    Follow up with Kathryn Koch, MD In 1 week.   Specialty:  Internal Medicine   Contact information:   [place] [id number] [phone]       Follow up with [facility] .   Specialty:  [place] information:   [address] [place] [phone]       The results of significant diagnostics from this hospitalization (including imaging, microbiology, ancillary and laboratory) are listed below for reference.    Significant Diagnostic Studies: Ct Head Wo Contrast  06/03/2015  CLINICAL DATA:  Increased confusion. EXAM: CT HEAD WITHOUT CONTRAST TECHNIQUE: Contiguous axial images were obtained from  the base of the skull through the vertex without intravenous contrast. COMPARISON:  CT scan of April 10, 2009. FINDINGS: Bony calvarium appears intact. Mild diffuse cortical atrophy is noted. Mild chronic ischemic white matter disease is noted. Old right posterior cerebellar infarction is noted. No mass effect or midline shift is noted. Ventricular size is within normal limits. There is no evidence of mass lesion, hemorrhage or acute infarction. IMPRESSION: Mild diffuse cortical atrophy. Mild chronic ischemic white matter disease. 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 No acute intracranial abnormality seen. Electronically Signed   By: Marijo Conception, M.D.   On: 06/03/2015 18:09   Ct Abdomen Pelvis W Contrast  06/03/2015  CLINICAL DATA:  Rectal pain for 1 week. Recent diagnosis of fecal impaction EXAM: CT ABDOMEN AND PELVIS WITH  CONTRAST TECHNIQUE: Multidetector CT imaging of the abdomen and pelvis was performed using the standard protocol following bolus administration of intravenous contrast. CONTRAST:  60mL OMNIPAQUE IOHEXOL 300 MG/ML SOLN, 69mL OMNIPAQUE IOHEXOL 300 MG/ML SOLN COMPARISON:  CT 01/23/2015 FINDINGS: Lower chest: Lung bases are clear. Hepatobiliary: RIGHT hepatic lobe cyst again noted no biliary duct dilatation. Gallbladder normal. Common bile duct normal Pancreas: Pancreas is normal. No ductal dilatation. No pancreatic inflammation. Spleen: Normal spleen Adrenals/urinary tract: Adrenal glands normal. There bilateral simple fluid attenuation rounded renal lesions. No obstructive uropathy. Bladder normal. Stomach/Bowel: Stomach, small bowel, appendix, and cecum are normal. Several diverticula of the descending colon sigmoid colon. No significant stool burden in the rectum. There is a thickening most distal rectum / anus to 13 mm (image 70, series 2) Vascular/Lymphatic: Abdominal aorta is normal caliber with atherosclerotic calcification. There is no retroperitoneal or periportal lymphadenopathy. No pelvic lymphadenopathy. Reproductive: Post hysterectomy. Other: No free fluid. Musculoskeletal: No aggressive osseous lesion. IMPRESSION: 1. No significant stool burden in the rectum. 2. Thickening of the anus vs distal rectum. Cannot exclude distal proctitis. Majority of the rectum is normal. 3. Mild to moderate sigmoid diverticulosis without diverticulitis. Electronically Signed   By: Suzy Bouchard M.D.   On: 06/03/2015 14:47    Microbiology: No results found for this or any previous visit (from the past 240 hour(s)).   Labs: Basic Metabolic Panel:  Recent Labs Lab 06/03/15 0956 06/04/15 0529 06/05/15 0515 06/06/15 0510  NA 143 139 135 137  K 3.3* 3.2* 4.3 4.8  CL 104 103 103 106  CO2 30 30 26 27   GLUCOSE 86 180* 122* 78  BUN 19 18 17 13   CREATININE 0.70 0.75 0.67 0.57  CALCIUM 9.4 9.1 8.6* 8.9   Liver  Function Tests:  Recent Labs Lab 06/03/15 0956  AST 19  ALT 12*  ALKPHOS 54  BILITOT 1.4*  PROT 6.7  ALBUMIN 3.4*   No results for input(s): LIPASE, AMYLASE in the last 168 hours. No results for input(s): AMMONIA in the last 168 hours. CBC:  Recent Labs Lab 06/03/15 0956 06/04/15 0529 06/05/15 0515 06/06/15 0510  WBC 4.2 5.0 5.1 3.5*  NEUTROABS 3.1  --   --   --   HGB 13.7 11.4* 10.9* 10.7*  HCT 42.4 35.4* 33.3* 33.8*  MCV 94.6 91.9 93.3 94.7  PLT 201 184 177 164   Cardiac Enzymes: No results for input(s): CKTOTAL, CKMB, CKMBINDEX, TROPONINI in the last 168 hours. BNP: BNP (last 3 results) No results for input(s): BNP in the last 8760 hours.  ProBNP (last 3 results) No results for input(s): PROBNP in the last 8760 hours.  CBG: No results for input(s): GLUCAP in the last  168 hours.     Signed:  Jacquez Sheetz A  [facility] 06/06/2015, 3:33 PM

## 2015-06-06 NOTE — Progress Notes (Signed)
[facility] PROGRESS NOTE   Kathryn Whitaker [id number] DOB: [birth year] DOA: 06/03/2015 PCP: Hoyt Koch, MD  HPI/Subjective: No complaints this morning. Denies any problems, to SNF today.  Assessment/Plan: Principal Problem:   Rectal pain Active Problems:   HTN (hypertension)   COPD (chronic obstructive pulmonary disease) (HCC)   Protein-calorie malnutrition, severe (HCC)   Delirium   Rectal pain Probable proctitis according to the CT scan, no evidence of impaction. According to their story patient had recent stool impaction treated with an enema at [facility]. Proctitis could be stercoral ulcer or just simply irritation from enemas. Pain is much better.  Failure to thrive According to the records for the past year patient lost about 14 pounds. Patient mentioned loss of appetite and difficulty swallowing. Dysphagia 3  Delirium Per EDP report, patient was not delirious to me, head CT pending. No evidence of UTI or other infection.  Severe protein energy malnutrition Lower albumin, loss of appetite and weight loss. RD to see.  Pericolonic mass There is 3.2 cm complex structure adjacent to the descending colon, seen in CT scan on August 2016. According to the CT it possible to represent low-grade malignancy. No mention in the CT scan from earlier today about any perirectal masses. This is discussed with granddaughter at bedside, family is aware about the possible malignancy, because of the advanced age no further workup.  Hypokalemia Repleted with oral supplements  Code Status: Full Code Family Communication: Plan discussed with the patient. Disposition Plan: Remains inpatient Diet: DIET SOFT Room service appropriate?: Yes; Fluid consistency:: Thin Diet - low sodium heart healthy  Consultants:  None  Procedures:  None  Antibiotics:  None   Objective: Filed Vitals:   06/05/15 2153 06/06/15 0507  BP: 100/49 91/51  Pulse: 62 63    Temp: 97.9 F (36.6 C) 97.9 F (36.6 C)  Resp: 18 18   No intake or output data in the 24 hours ending 06/06/15 1046 There were no vitals filed for this visit.  Exam: General: Alert and awake, oriented x3, not in any acute distress. HEENT: anicteric sclera, pupils reactive to light and accommodation, EOMI CVS: S1-S2 clear, no murmur rubs or gallops Chest: clear to auscultation bilaterally, no wheezing, rales or rhonchi Abdomen: soft nontender, nondistended, normal bowel sounds, no organomegaly Extremities: no cyanosis, clubbing or edema noted bilaterally Neuro: Cranial nerves II-XII intact, no focal neurological deficits  Data Reviewed: Basic Metabolic Panel:  Recent Labs Lab 06/03/15 0956 06/04/15 0529 06/05/15 0515 06/06/15 0510  NA 143 139 135 137  K 3.3* 3.2* 4.3 4.8  CL 104 103 103 106  CO2 30 30 26 27   GLUCOSE 86 180* 122* 78  BUN 19 18 17 13   CREATININE 0.70 0.75 0.67 0.57  CALCIUM 9.4 9.1 8.6* 8.9   Liver Function Tests:  Recent Labs Lab 06/03/15 0956  AST 19  ALT 12*  ALKPHOS 54  BILITOT 1.4*  PROT 6.7  ALBUMIN 3.4*   No results for input(s): LIPASE, AMYLASE in the last 168 hours. No results for input(s): AMMONIA in the last 168 hours. CBC:  Recent Labs Lab 06/03/15 0956 06/04/15 0529 06/05/15 0515 06/06/15 0510  WBC 4.2 5.0 5.1 3.5*  NEUTROABS 3.1  --   --   --   HGB 13.7 11.4* 10.9* 10.7*  HCT 42.4 35.4* 33.3* 33.8*  MCV 94.6 91.9 93.3 94.7  PLT 201 184 177 164   Cardiac Enzymes: No results for input(s): CKTOTAL, CKMB, CKMBINDEX, TROPONINI in the last  168 hours. BNP (last 3 results) No results for input(s): BNP in the last 8760 hours.  ProBNP (last 3 results) No results for input(s): PROBNP in the last 8760 hours.  CBG: No results for input(s): GLUCAP in the last 168 hours.  Micro No results found for this or any previous visit (from the past 240 hour(s)).   Studies: No results found.  Scheduled Meds: . aspirin  81 mg Oral  Daily  . enoxaparin (LOVENOX) injection  40 mg Subcutaneous Q24H  . feeding supplement  1 Container Oral BID BM  . losartan  25 mg Oral Daily  . multivitamin with minerals  1 tablet Oral Daily  . polyethylene glycol  17 g Oral Daily  . QUEtiapine  25 mg Oral QHS   Continuous Infusions: . dextrose 5 % and 0.45 % NaCl with KCl 10 mEq/L 75 mL/hr at 06/06/15 0346       Time spent: 35 minutes    [facility] A  [facility] Pager [phone] If 7PM-7AM, please contact night-coverage at [email or web address], password [facility] 06/06/2015, 10:46 AM

## 2015-06-06 NOTE — Care Management Obs Status (Signed)
[id number] [place]   Patient Details  Name: Kathryn Whitaker MRN: [id number] Date of Birth: 1920/04/28   Medicare Observation Status Notification Given:    [id number]   Leeroy Cha, RN 06/06/2015, 11:50 AM

## 2015-06-06 NOTE — Progress Notes (Signed)
CSW received phone call from facility representative Katharine Look from [facility] regarding bed availability. Per Katharine Look, they will not have a bed available for the patient on today. MD at facility decided to keep a patient anticipated to discharge on today. Katharine Look informed CSW that she would get in contact with son [name] to inform.   CSW followed up with son regarding disposition plan. Son informed CSW that he is currently at [facility] receiving assistance with possibly getting to [facility]. CSW informed son that a decision will have to be made on today. CSW also informed son of bed available at [facility], however, son stated "They are out of the picture". Son informed CSW that he would be in contact after he speaks with [facility], and that a decision will be made today.   CSW received phone call from [facility] regarding patient's insurance. Requested information faxed over to them. Per Grayson, she will follow up regarding coverage.   CSW also received phone call from [facility] regarding the patient. Per Lexine Baton, patient is being evaluated for possible admission. Lexine Baton will follow up with CSW.    Case discussed with Supervisor Zack. Patient and family will need to make a decision today.   Lucius Conn, [place] Social Worker [place] [phone]

## 2015-06-06 NOTE — Progress Notes (Signed)
Patient discharged to SNF via [facility]. 

## 2015-06-06 NOTE — Progress Notes (Signed)
Pt ready to d/c to [facility] today. Pt / son are in agreement with d/c plan. [facility] transport is needed. Son is aware out of pocket costs may be associated with [facility] transport. D/C Summary sent to SNF for review prior to d/c. [facility] will provide 5 day LOG. SNF and son are awre.  Werner Lean LCSW [phone]

## 2015-06-06 NOTE — Progress Notes (Signed)
Report called to Aafo, nurse at [facility].

## 2015-06-07 ENCOUNTER — Non-Acute Institutional Stay (SKILLED_NURSING_FACILITY): Payer: Medicare Other | Admitting: Internal Medicine

## 2015-06-07 DIAGNOSIS — R634 Abnormal weight loss: Secondary | ICD-10-CM | POA: Diagnosis not present

## 2015-06-07 DIAGNOSIS — F05 Delirium due to known physiological condition: Secondary | ICD-10-CM | POA: Diagnosis not present

## 2015-06-07 DIAGNOSIS — K6289 Other specified diseases of anus and rectum: Secondary | ICD-10-CM

## 2015-06-07 DIAGNOSIS — E038 Other specified hypothyroidism: Secondary | ICD-10-CM

## 2015-06-07 DIAGNOSIS — R410 Disorientation, unspecified: Secondary | ICD-10-CM

## 2015-06-07 NOTE — Progress Notes (Signed)
Patient ID: Kathryn Whitaker, female   DOB: 12-20-1919, 81 y.o.   MRN: [id number]    Facility; [facility] Chief complaint; admission to SNF post admit to [facility] from 12/10 to 06/06/2015  History; this is a 81 year old woman who was living with her son in [place]. She was apparently brought to this area as she has other family members here. In fact the patient tells me she owns property locally. Apparently her son and Marijo File have been trying to get her admitted to a facility. The exact circumstances of this are unclear. She apparently has a fairly persistent complaint of rectal pain although it is very difficult to get her to characterize this. She apparently was seen in a outpatient clinic at [facility] and was discharged after's receiving an enema. A CT scan a call and suggested proctitis. She apparently had a prior CT scan in August that queried a perirectal mass although this CT scan did not, and on it. According to the doctor looking after in the hospital it was decided not to pursue this given the patient's "advanced age". The patient also tells me she has lost 20 pounds in the last 3-4 months she states that according to her son she was not eating when she lived in [place]. She was seen by speech language pathology in the hospital and a dysphagia 3 diet with thin liquids was recommended. It is noted that her hemoglobin went from 13.7 on admission to 10.7 while she was in hospital I don't see anything else was done about this.  There is a reference to protein calorie malnutrition her albumin level was 3.4. Any reduction in albumin represents moderate protein malnutrition however this is certainly not severe.  CBC Latest Ref Rng 06/06/2015 06/05/2015 06/04/2015  WBC 4.0 - 10.5 K/uL 3.5(L) 5.1 5.0  Hemoglobin 12.0 - 15.0 g/dL 10.7(L) 10.9(L) 11.4(L)  Hematocrit 36.0 - 46.0 % 33.8(L) 33.3(L) 35.4(L)  Platelets 150 - 400 K/uL 164 177 184   BMP Latest Ref Rng 06/06/2015 06/05/2015 06/04/2015    Glucose 65 - 99 mg/dL 78 122(H) 180(H)  BUN 6 - 20 mg/dL 13 17 18   Creatinine 0.44 - 1.00 mg/dL 0.57 0.67 0.75  Sodium 135 - 145 mmol/L 137 135 139  Potassium 3.5 - 5.1 mmol/L 4.8 4.3 3.2(L)  Chloride 101 - 111 mmol/L 106 103 103  CO2 22 - 32 mmol/L 27 26 30   Calcium 8.9 - 10.3 mg/dL 8.9 8.6(L) 9.1    Past Medical History  Diagnosis Date  . Allergic rhinitis   . Acute asthmatic bronchitis   . Hypertension   . Hypercholesteremia   . GERD (gastroesophageal reflux disease)   . Diverticulosis of colon   . IBS (irritable bowel syndrome)   . History of colonic polyps   . UTI (lower urinary tract infection)   . DJD (degenerative joint disease)   . Lumbar back pain   . Lacunar infarction ([place])   . Vitamin D deficiency   . Anxiety     Past Surgical History  Procedure Laterality Date  . Abdominal hysterectomy      Current Outpatient Prescriptions on File Prior to Visit  Medication Sig Dispense Refill  . acetaminophen (TYLENOL) 325 MG tablet Take 2 tablets (650 mg total) by mouth every 6 (six) hours as needed. (Patient not taking: Reported on 04/06/2015)    . albuterol (PROVENTIL HFA) 108 (90 BASE) MCG/ACT inhaler Inhale 2 puffs into the lungs every 6 (six) hours as needed. For shortness of breath  1 Inhaler 0  . aspirin 81 MG tablet Take 81 mg by mouth daily.    Marland Kitchen docusate sodium (COLACE) 100 MG capsule Take 1 capsule (100 mg total) by mouth every 12 (twelve) hours. (Patient not taking: Reported on 06/03/2015) 60 capsule 0  . levofloxacin (LEVAQUIN) 500 MG tablet Take 500 mg by mouth daily. For 7 days    . levothyroxine (SYNTHROID, LEVOTHROID) 50 MCG tablet Take 1 tablet (50 mcg total) by mouth daily before breakfast.    . losartan (COZAAR) 25 MG tablet Take 1 tablet (25 mg total) by mouth daily. 60 tablet 1  . metoprolol succinate (TOPROL XL) 25 MG 24 hr tablet Take 1 tablet (25 mg total) by mouth daily. (Patient not taking: Reported on 01/23/2015) 30 tablet 1  . Multiple  Vitamins-Minerals (MULTIVITAMIN WITH MINERALS) tablet Take 1 tablet by mouth daily.    . polyethylene glycol (MIRALAX / GLYCOLAX) packet Take 17 g by mouth daily.    Marland Kitchen Propylene Glycol (SYSTANE BALANCE OP) Apply 1 application to eye as needed (for dry eyes).       Social; as noted the patient was living in [place] with his son. There was some suggestion that the son was trying to gain placement for his mom. The exact reason for this is not totally clear. There is weight loss is noted the patient states she used a walker  reports that she has never smoked. She has never used smokeless tobacco. She reports that she does not drink alcohol or use illicit drugs.  Family history; none of relevance.  Review of systems Gen. as mentioned the patient tells me she lost 20 pounds in the last 3 months this is somewhat greater than the number stated in the hospital. HEENT she has few remaining teeth and requests a soft diet. Respiratory no shortness of breath Cardiac no chest pain GI; no abdominal pain, no change in bowel habits, no diarrhea. Unknown about blood in her stool. She is complaining of perianal pain which is episodic GU no dysuria. Musculoskeletal no significant joint pain Neurologic states she can walk Mental status no complaints of depression Endocrine not a diabetic  Physical exam Gen. somewhat gaunt-looking lady but alert and talkative HEENT no oral lesions seen Neck no masses Lymph nonpalpable in the cervical clavicular or axillary or inguinal areas Breasts no masses felt Respiratory clear entry bilaterally Cardiac heart sounds are normal soft midsystolic murmur that sounds benign. If anything she appears slightly volume contracted Abdomen; not distended bowel sounds are positive she has some tenderness in the lower quadrant no guarding or rebound no liver spleen or masses GU bladder not distended no CVA tenderness Extremities no edema Rectal; scant amount of OB negative stool no  masses were felt Neurologic she has antigravity strength reflexes present at the knee jerks. Significant quad and hamstring wasting however she clearly has fairly good strength. Mental status; any abnormalities I see here seem mild. Patient is orientated to [place] year month. She is able to give her own history. Impression/plan   Impression/plan #1 perirectal pain; there are no masses on exam. An original CT scan done in August suggested a carry descending colon mass this was not shown on the current one which simply suggested thickening and proctitis. Her stool is guaiac negative. The tone from the hospital was that there was to be no aggressive workup of this. #2 weight loss; this is somewhat more concerning. The patient has loose soft tissue even for a 81 year old. The mildly  low serum albumin level would suggest a moderate degree of protein malnutrition #3 there is a statement in the hospital notes that she has dementia. Any degree of dementia here is probably mild. #4 hypothyroidism on replacement. Patient's TSH in the hospital was 10.6. After reviewing the chart it would seem that the hypothyroidism may be new, I can't see that she was on Synthroid on admission #5 COPD listed on her problem list this is stable #6 hypertension this also appears to be stable. #7 moderate protein calorie malnutrition by albumin however a weight loss of 20 pounds in 3 months with make this much more impressive. I will monitor her weight while she is here.

## 2015-06-08 NOTE — Progress Notes (Signed)
   06/06/2015  0940   OT G-codes **NOT FOR INPATIENT CLASS**  Functional Assessment Tool Used clinical judgement   Functional Limitation Self care  Self Care Current Status [id number]) CK  Self Care Goal Status [id number]) CK  Self Care Discharge Status [id number]) CK

## 2015-06-13 ENCOUNTER — Ambulatory Visit: Payer: Medicare Other | Admitting: Internal Medicine

## 2015-07-13 ENCOUNTER — Non-Acute Institutional Stay (SKILLED_NURSING_FACILITY): Payer: Medicare Other | Admitting: Internal Medicine

## 2015-07-13 DIAGNOSIS — F411 Generalized anxiety disorder: Secondary | ICD-10-CM

## 2015-07-13 DIAGNOSIS — I10 Essential (primary) hypertension: Secondary | ICD-10-CM

## 2015-07-13 DIAGNOSIS — J029 Acute pharyngitis, unspecified: Secondary | ICD-10-CM

## 2015-07-14 DIAGNOSIS — J029 Acute pharyngitis, unspecified: Secondary | ICD-10-CM | POA: Insufficient documentation

## 2015-07-14 MED ORDER — LORAZEPAM 0.5 MG PO TABS: ORAL_TABLET | ORAL | Status: DC

## 2015-07-14 NOTE — Progress Notes (Signed)
Patient ID: Kathryn Whitaker, female   DOB: 01/28/1920, 82 y.o.   MRN: [id number]    [facility] Room Number: 941D  Place of Service: SNF (31)     Allergies  Allergen Reactions  . Demerol [Meperidine]     Pt states she can't remember type of reaction  . Influenza Vaccines     Pt can't remember reaction but states allergic and has not had in 25 years  . Peanuts [Peanut Oil]     Pt reports she is allergic to peanuts, but was asking to eat peanut butter. Pt unsure of her reaction.   [name] [Amoxicill-Clarithro-Lansopraz] Hives    .Marland KitchenHas patient had a PCN reaction causing immediate rash, facial/tongue/throat swelling, SOB or lightheadedness with hypotension: unknown Has patient had a PCN reaction causing severe rash involving mucus membranes or skin necrosis: unknown Has patient had a PCN reaction that required hospitalization unknown Has patient had a PCN reaction occurring within the last 10 years: unknown If all of the above answers are "NO", then may proceed with Cephalosporin use.   . Telithromycin Hives    REACTION: pt states HIVES....    Chief Complaint  Patient presents with  . Acute Visit    sore throat    HPI:  Patient complains of a sore throat for the last 48 hours. She denies fever or chills. No hemoptysis. Denies cough. No adenopathy. Denies sinus drainage or postnasal drip.  Medications: Patient's Medications  New Prescriptions   No medications on file  Previous Medications   ACETAMINOPHEN (TYLENOL) 325 MG TABLET    Take 2 tablets (650 mg total) by mouth every 6 (six) hours as needed.   ALBUTEROL (PROVENTIL HFA) 108 (90 BASE) MCG/ACT INHALER    Inhale 2 puffs into the lungs every 6 (six) hours as needed. For shortness of breath   ASPIRIN 81 MG TABLET    Take 81 mg by mouth daily.   DOCUSATE SODIUM (COLACE) 100 MG CAPSULE    Take 1 capsule (100 mg total) by mouth every 12 (twelve) hours.   LEVOFLOXACIN (LEVAQUIN) 500 MG  TABLET    Take 500 mg by mouth daily. For 7 days   LEVOTHYROXINE (SYNTHROID, LEVOTHROID) 50 MCG TABLET    Take 1 tablet (50 mcg total) by mouth daily before breakfast.   LOSARTAN (COZAAR) 25 MG TABLET    Take 1 tablet (25 mg total) by mouth daily.   METOPROLOL SUCCINATE (TOPROL XL) 25 MG 24 HR TABLET    Take 1 tablet (25 mg total) by mouth daily.   MULTIPLE VITAMINS-MINERALS (MULTIVITAMIN WITH MINERALS) TABLET    Take 1 tablet by mouth daily.   POLYETHYLENE GLYCOL (MIRALAX / GLYCOLAX) PACKET    Take 17 g by mouth daily.   PROPYLENE GLYCOL (SYSTANE BALANCE OP)    Apply 1 application to eye as needed (for dry eyes).   Modified Medications   No medications on file  Discontinued Medications   No medications on file     Review of Systems  Constitutional: Negative for fever, activity change, appetite change and fatigue.  HENT: Positive for sore throat. Negative for ear pain, sinus pressure, tinnitus, trouble swallowing and voice change.   Eyes: Positive for visual disturbance (Corrective lenses).  Respiratory: Negative for cough, shortness of breath and wheezing.   Cardiovascular: Negative for chest pain, palpitations and leg swelling.  Gastrointestinal: Negative for nausea, abdominal pain, diarrhea and constipation.  Endocrine: Negative for cold intolerance and heat intolerance.  Musculoskeletal: Positive for gait problem.  Allergic/Immunologic: Negative.   Neurological: Negative for tremors, speech difficulty and light-headedness.  Hematological: Negative.   Psychiatric/Behavioral: Negative for behavioral problems. The patient is nervous/anxious.     Filed Vitals:   07/14/15 1502  BP: 120/80  Pulse: 80  Temp: 98.6 F (37 C)  Resp: 16   Wt Readings from Last 3 Encounters:  04/06/15 106 lb (48.081 kg)  06/27/14 116 lb 8 oz (52.844 kg)  04/06/14 114 lb 12.8 oz (52.073 kg)    There is no weight on file to calculate BMI.  Physical Exam  Constitutional: She is oriented to person,  place, and time. No distress.  Thin, elderly, frail  HENT:  Right Ear: External ear normal.  Left Ear: External ear normal.  Nose: Nose normal.  Mouth/Throat: Oropharynx is clear and moist. No oropharyngeal exudate.  Eyes: Conjunctivae and EOM are normal. Pupils are equal, round, and reactive to light. No scleral icterus.  Neck: No JVD present. No tracheal deviation present. No thyromegaly present.  Cardiovascular: Normal rate, regular rhythm, normal heart sounds and intact distal pulses.  Exam reveals no gallop and no friction rub.   No murmur heard. Pulmonary/Chest: Effort normal. No respiratory distress. She has no wheezes. She has no rales. She exhibits no tenderness.  Abdominal: She exhibits no distension and no mass. There is no tenderness.  Musculoskeletal: Normal range of motion. She exhibits no edema or tenderness.  Lymphadenopathy:    She has no cervical adenopathy.  Neurological: She is alert and oriented to person, place, and time. No cranial nerve deficit. Coordination normal.  Skin: No rash noted. She is not diaphoretic. No erythema. No pallor.  Psychiatric: She has a normal mood and affect. Her behavior is normal. Judgment and thought content normal.     Labs reviewed: Lab Summary Latest Ref Rng 06/06/2015 06/05/2015 06/04/2015 06/03/2015  Hemoglobin 12.0 - 15.0 g/dL 10.7(L) 10.9(L) 11.4(L) 13.7  Hematocrit 36.0 - 46.0 % 33.8(L) 33.3(L) 35.4(L) 42.4  White count 4.0 - 10.5 K/uL 3.5(L) 5.1 5.0 4.2  Platelet count 150 - 400 K/uL 164 177 184 201  Sodium 135 - 145 mmol/L 137 135 139 143  Potassium 3.5 - 5.1 mmol/L 4.8 4.3 3.2(L) 3.3(L)  Calcium 8.9 - 10.3 mg/dL 8.9 8.6(L) 9.1 9.4  Phosphorus - (None) (None) (None) (None)  Creatinine 0.44 - 1.00 mg/dL 0.57 0.67 0.75 0.70  AST 15 - 41 U/L (None) (None) (None) 19  Alk Phos 38 - 126 U/L (None) (None) (None) 54  Bilirubin 0.3 - 1.2 mg/dL (None) (None) (None) 1.4(H)  Glucose 65 - 99 mg/dL 78 122(H) 180(H) 86  Cholesterol -  (None) (None) (None) (None)  HDL cholesterol - (None) (None) (None) (None)  Triglycerides - (None) (None) (None) (None)  LDL Direct - (None) (None) (None) (None)  LDL Calc - (None) (None) (None) (None)  Total protein 6.5 - 8.1 g/dL (None) (None) (None) 6.7  Albumin 3.5 - 5.0 g/dL (None) (None) (None) 3.4(L)   Lab Results  Component Value Date   TSH 10.652* 06/03/2015   Lab Results  Component Value Date   BUN 13 06/06/2015   BUN 17 06/05/2015   BUN 18 06/04/2015   Lab Results  Component Value Date   CREATININE 0.57 06/06/2015   CREATININE 0.67 06/05/2015   CREATININE 0.75 06/04/2015   Lab Results  Component Value Date   HGBA1C 5.1 06/03/2015       Assessment/Plan  1. Acute pharyngitis, unspecified etiology Chloraseptic prn  2.  Essential hypertension controlled  3. Generalized anxiety dis. Repeat q2h prn.order -Lorazepam 0.5 mg q6h prn anxiety

## 2015-07-24 ENCOUNTER — Encounter: Payer: Self-pay | Admitting: Internal Medicine

## 2015-07-24 ENCOUNTER — Non-Acute Institutional Stay (SKILLED_NURSING_FACILITY): Payer: Medicare Other | Admitting: Internal Medicine

## 2015-07-24 DIAGNOSIS — I10 Essential (primary) hypertension: Secondary | ICD-10-CM

## 2015-07-24 DIAGNOSIS — J029 Acute pharyngitis, unspecified: Secondary | ICD-10-CM | POA: Diagnosis not present

## 2015-07-24 DIAGNOSIS — K6289 Other specified diseases of anus and rectum: Secondary | ICD-10-CM | POA: Diagnosis not present

## 2015-07-24 DIAGNOSIS — R410 Disorientation, unspecified: Secondary | ICD-10-CM | POA: Diagnosis not present

## 2015-07-24 DIAGNOSIS — J42 Unspecified chronic bronchitis: Secondary | ICD-10-CM

## 2015-07-24 NOTE — Progress Notes (Signed)
[facility] [place] Room Number: [place] of Service:       Allergies  Allergen Reactions  . Demerol [Meperidine]     Pt states she can't remember type of reaction  . Influenza Vaccines     Pt can't remember reaction but states allergic and has not had in 25 years  . Peanuts [Peanut Oil]     Pt reports she is allergic to peanuts, but was asking to eat peanut butter. Pt unsure of her reaction.   [name] [Amoxicill-Clarithro-Lansopraz] Hives    .Marland KitchenHas patient had a PCN reaction causing immediate rash, facial/tongue/throat swelling, SOB or lightheadedness with hypotension: unknown Has patient had a PCN reaction causing severe rash involving mucus membranes or skin necrosis: unknown Has patient had a PCN reaction that required hospitalization unknown Has patient had a PCN reaction occurring within the last 10 years: unknown If all of the above answers are "NO", then may proceed with Cephalosporin use.   . Telithromycin Hives    REACTION: pt states HIVES....    Chief Complaint  Patient presents with  . Discharge Note    HPI:  Patient was admitted this facility 06/06/2015. Her anticipated discharge date is 07/24/2014.  Patient entered the facility following hospitalization with confusion and perirectal pain.  Since entry here she developed a sore throat and a bronchitis. Otherwise, she is making good progress and is doing much more than the arrival.  Patient had been delirious in the early part of her hospital stay, but this has corrected area she still seems to have some memory issues.  Medications: Patient's Medications  New Prescriptions   No medications on file  Previous Medications   ACETAMINOPHEN (TYLENOL) 325 MG TABLET    Take 2 tablets (650 mg total) by mouth every 6 (six) hours as needed.   ALBUTEROL (PROVENTIL HFA) 108 (90 BASE) MCG/ACT INHALER    Inhale 2 puffs into the lungs every 6 (six) hours as needed. For shortness of  breath   ASPIRIN 81 MG TABLET    Take 81 mg by mouth daily.   DOCUSATE SODIUM (COLACE) 100 MG CAPSULE    Take 1 capsule (100 mg total) by mouth every 12 (twelve) hours.   EMOLLIENT (EUCERIN) LOTION    Apply to skin daily as needed for moisturizer.   LEVOTHYROXINE (SYNTHROID, LEVOTHROID) 50 MCG TABLET    Take 1 tablet (50 mcg total) by mouth daily before breakfast.   LOSARTAN (COZAAR) 25 MG TABLET    Take 1 tablet (25 mg total) by mouth daily.   METOPROLOL SUCCINATE (TOPROL XL) 25 MG 24 HR TABLET    Take 1 tablet (25 mg total) by mouth daily.   MULTIPLE VITAMINS-MINERALS (MULTIVITAMIN WITH MINERALS) TABLET    Take 1 tablet by mouth daily.   POLYETHYLENE GLYCOL (MIRALAX / GLYCOLAX) PACKET    Take 17 g by mouth daily.   PROPYLENE GLYCOL (SYSTANE BALANCE OP)    Apply 1 application to eye as needed (for dry eyes).   Modified Medications   No medications on file  Discontinued Medications   LEVOFLOXACIN (LEVAQUIN) 500 MG TABLET    Take 500 mg by mouth daily. For 7 days   LORAZEPAM (ATIVAN) 0.5 MG TABLET    One every 6 hours if needed for anxiety     Review of Systems  Constitutional: Negative for fever, activity change, appetite change and fatigue.  HENT: Negative for ear pain, sinus pressure, sore throat, tinnitus, trouble  swallowing and voice change.   Eyes: Positive for visual disturbance (Corrective lenses).  Respiratory: Negative for cough, shortness of breath and wheezing.   Cardiovascular: Positive for chest pain (Left lower chest posteriorly). Negative for palpitations and leg swelling.  Gastrointestinal: Negative for nausea, abdominal pain, diarrhea and constipation.  Endocrine: Negative for cold intolerance and heat intolerance.  Genitourinary:       Complains of some rectal discomfort  Musculoskeletal: Positive for gait problem.  Allergic/Immunologic: Negative.   Neurological: Negative for tremors, speech difficulty and light-headedness.  Hematological: Negative.     Psychiatric/Behavioral: Negative for behavioral problems. The patient is nervous/anxious.     Filed Vitals:   07/24/15 1451  BP: 111/56  Pulse: 65  Temp: 98 F (36.7 C)  TempSrc: Oral  Resp: 18   Wt Readings from Last 3 Encounters:  04/06/15 106 lb (48.081 kg)  06/27/14 116 lb 8 oz (52.844 kg)  04/06/14 114 lb 12.8 oz (52.073 kg)    There is no weight on file to calculate BMI.  Physical Exam  Constitutional: She is oriented to person, place, and time. No distress.  Thin, elderly, frail  HENT:  Right Ear: External ear normal.  Left Ear: External ear normal.  Nose: Nose normal.  Mouth/Throat: Oropharynx is clear and moist. No oropharyngeal exudate.  Eyes: Conjunctivae and EOM are normal. Pupils are equal, round, and reactive to light. No scleral icterus.  Neck: No JVD present. No tracheal deviation present. No thyromegaly present.  Cardiovascular: Normal rate, regular rhythm, normal heart sounds and intact distal pulses.  Exam reveals no gallop and no friction rub.   No murmur heard. Pulmonary/Chest: Effort normal. No respiratory distress. She has no wheezes. She has no rales. She exhibits no tenderness.  Abdominal: She exhibits no distension and no mass. There is no tenderness.  Musculoskeletal: Normal range of motion. She exhibits no edema or tenderness.  Lymphadenopathy:    She has no cervical adenopathy.  Neurological: She is alert and oriented to person, place, and time. No cranial nerve deficit. Coordination normal.  Skin: No rash noted. She is not diaphoretic. No erythema. No pallor.  Psychiatric: She has a normal mood and affect. Her behavior is normal. Judgment and thought content normal.     Labs reviewed: Lab Summary Latest Ref Rng 06/06/2015 06/05/2015 06/04/2015 06/03/2015  Hemoglobin 12.0 - 15.0 g/dL 10.7(L) 10.9(L) 11.4(L) 13.7  Hematocrit 36.0 - 46.0 % 33.8(L) 33.3(L) 35.4(L) 42.4  White count 4.0 - 10.5 K/uL 3.5(L) 5.1 5.0 4.2  Platelet count 150 - 400  K/uL 164 177 184 201  Sodium 135 - 145 mmol/L 137 135 139 143  Potassium 3.5 - 5.1 mmol/L 4.8 4.3 3.2(L) 3.3(L)  Calcium 8.9 - 10.3 mg/dL 8.9 8.6(L) 9.1 9.4  Phosphorus - (None) (None) (None) (None)  Creatinine 0.44 - 1.00 mg/dL 0.57 0.67 0.75 0.70  AST 15 - 41 U/L (None) (None) (None) 19  Alk Phos 38 - 126 U/L (None) (None) (None) 54  Bilirubin 0.3 - 1.2 mg/dL (None) (None) (None) 1.4(H)  Glucose 65 - 99 mg/dL 78 122(H) 180(H) 86  Cholesterol - (None) (None) (None) (None)  HDL cholesterol - (None) (None) (None) (None)  Triglycerides - (None) (None) (None) (None)  LDL Direct - (None) (None) (None) (None)  LDL Calc - (None) (None) (None) (None)  Total protein 6.5 - 8.1 g/dL (None) (None) (None) 6.7  Albumin 3.5 - 5.0 g/dL (None) (None) (None) 3.4(L)   Lab Results  Component Value Date   TSH 10.652*  06/03/2015   Lab Results  Component Value Date   BUN 13 06/06/2015   BUN 17 06/05/2015   BUN 18 06/04/2015   Lab Results  Component Value Date   CREATININE 0.57 06/06/2015   CREATININE 0.67 06/05/2015   CREATININE 0.75 06/04/2015   Lab Results  Component Value Date   HGBA1C 5.1 06/03/2015       Assessment/Plan  1. Essential hypertension Controlled  2. Rectal pain Mild discomfort. Improvement over the last 3 weeks.  3. Delirium Resolved  4. Chronic bronchitis, unspecified chronic bronchitis type [facility]) Patient had a sore throat initially, but this has improved. She still is coughing up some greenish phlegm. Afebrile.  5. Acute pharyngitis, unspecified etiology Resolved.

## 2015-08-09 ENCOUNTER — Encounter: Payer: Self-pay | Admitting: Internal Medicine

## 2015-08-09 ENCOUNTER — Ambulatory Visit (INDEPENDENT_AMBULATORY_CARE_PROVIDER_SITE_OTHER): Payer: Medicare Other | Admitting: Internal Medicine

## 2015-08-09 VITALS — BP 120/70 | HR 82 | Temp 97.9°F | Resp 14 | Ht 63.0 in | Wt 100.8 lb

## 2015-08-09 DIAGNOSIS — R410 Disorientation, unspecified: Secondary | ICD-10-CM

## 2015-08-09 DIAGNOSIS — R300 Dysuria: Secondary | ICD-10-CM | POA: Diagnosis not present

## 2015-08-09 DIAGNOSIS — J441 Chronic obstructive pulmonary disease with (acute) exacerbation: Secondary | ICD-10-CM | POA: Diagnosis not present

## 2015-08-09 LAB — POCT URINALYSIS DIPSTICK
BILIRUBIN UA: NEGATIVE
Glucose, UA: NEGATIVE
Ketones, UA: 1
Leukocytes, UA: NEGATIVE
NITRITE UA: NEGATIVE
PH UA: 6
PROTEIN UA: 15
RBC UA: NEGATIVE
Spec Grav, UA: 1.025
UROBILINOGEN UA: NEGATIVE

## 2015-08-09 MED ORDER — DOXYCYCLINE HYCLATE 100 MG PO CAPS: 100.0000 mg | ORAL_CAPSULE | Freq: Two times a day (BID) | ORAL | Status: DC

## 2015-08-09 NOTE — Progress Notes (Signed)
Pre visit review using our clinic review tool, if applicable. No additional management support is needed unless otherwise documented below in the visit note. 

## 2015-08-09 NOTE — Patient Instructions (Signed)
We have sent in a medicine for the breathing called doxycycline. Take 1 pill twice a day for 1 week to help the breathing and the urine.   Make sure to drink plenty of water to help the urine as well.

## 2015-08-10 DIAGNOSIS — R300 Dysuria: Secondary | ICD-10-CM | POA: Insufficient documentation

## 2015-08-10 NOTE — Assessment & Plan Note (Signed)
Consider this to be flare of her COPD, she has her albuterol inhaler which she has needed some. Will rx doxycycline for 7 days for treatment.

## 2015-08-10 NOTE — Progress Notes (Signed)
   Subjective:    Patient ID: Kathryn Whitaker, female    DOB: 1920/02/07, 82 y.o.   MRN: [id number]  HPI The patient is a 82 YO female coming in for follow up since leaving the nursing home. She was having some problems with delirium of her dementia and weakness. She did undergo physical therapy while there. She was treated for bronchitis while there and she is not sure if or what antibiotic she received. She feels her breathing has worsened since leaving. She is coughing up green stuff. She is having SOB. Denies fevers or chills.  She also is thinking she may have a urinary infection. She is getting some discomfort when urinating and dark urine. Not drinking as much water since being home. No stomach or back pain. She is now living with her son and they do have some tension. She is trying her best to get along with him. She states that he does not let her use the stove or anything.   PMH, [facility], social history reviewed and updated.   Review of Systems  Unable to perform ROS: Dementia  Constitutional: Negative for fever, activity change, appetite change and fatigue.  HENT: Positive for congestion, rhinorrhea and sinus pressure. Negative for ear discharge, ear pain, sore throat and trouble swallowing.   Eyes: Negative.   Respiratory: Positive for cough and shortness of breath. Negative for chest tightness and wheezing.   Cardiovascular: Negative for chest pain, palpitations and leg swelling.  Gastrointestinal: Negative for nausea, vomiting, abdominal pain, diarrhea, constipation, blood in stool and abdominal distention.  Genitourinary: Positive for dysuria and urgency.  Neurological: Negative.        Memory change      Objective:   Physical Exam  Constitutional: She is oriented to person, place, and time. She appears well-developed and well-nourished.  Thin  HENT:  Head: Normocephalic and atraumatic.  Oropharynx red with some clear drainage, nasal crusting present.   Eyes: EOM are normal.    Neck: Normal range of motion.  Cardiovascular: Normal rate and regular rhythm.   Pulmonary/Chest: Effort normal. No respiratory distress. She has wheezes. She has no rales.  Mild coarse wheezing which partially clears with cough.  Abdominal: Soft. Bowel sounds are normal. She exhibits no distension and no mass. There is no tenderness. There is no rebound.  Neurological: She is alert and oriented to person, place, and time. Coordination normal.  Skin: Skin is warm and dry.  Vitals reviewed.  Filed Vitals:   08/09/15 1028  BP: 120/70  Pulse: 82  Temp: 97.9 F (36.6 C)  TempSrc: Oral  Resp: 14  Height: 5\' 3"  (1.6 m)  Weight: 100 lb 12.8 oz (45.723 kg)  SpO2: 97%      Assessment & Plan:

## 2015-08-10 NOTE — Assessment & Plan Note (Signed)
No signs of infection from POC U/A in the office. Advised her to keep more liquids on hand so she can stay hydrated for the dark urine.

## 2015-08-10 NOTE — Assessment & Plan Note (Signed)
At this time resolved, she does still have dementia but without behavioral problems. She is getting used to living with her son but feels like a guest in his house.

## 2015-08-15 ENCOUNTER — Telehealth: Payer: Self-pay

## 2015-08-15 DIAGNOSIS — R278 Other lack of coordination: Secondary | ICD-10-CM | POA: Diagnosis not present

## 2015-08-15 NOTE — Telephone Encounter (Signed)
Home Health Cert/Plan of Care received (07/26/2015 - 0-06/2015) and placed on MD's desk for signature

## 2015-08-16 NOTE — Telephone Encounter (Signed)
Paperwork signed, faxed, copy sent to scan 

## 2015-08-22 ENCOUNTER — Telehealth: Payer: Self-pay | Admitting: Internal Medicine

## 2015-08-22 NOTE — Telephone Encounter (Signed)
Wells Guiles from [facility] called to let you know pt has become very aggressive and combative with family over the weekend.  She thinks there may be onset of dementia. She is requesting an order for their pysch nurse to come to the house for an assessment so pt won't need to come into [place]. Wells Guiles can be reached at [phone]  Fax [phone]

## 2015-08-22 NOTE — Telephone Encounter (Signed)
Left message for Kathryn Whitaker to call me back.

## 2015-08-22 NOTE — Telephone Encounter (Signed)
We already know that she has dementia. If she needs symptom management we can have a visit. If the psych nurse if only able to diagnose dementia then I don't see a good need for this.

## 2015-08-25 ENCOUNTER — Telehealth: Payer: Self-pay | Admitting: Internal Medicine

## 2015-08-25 NOTE — Telephone Encounter (Signed)
Son called in again

## 2015-08-25 NOTE — Telephone Encounter (Signed)
Patient has left skilled nursing facility.  She is requesting losartan, levothyroxine, and metoprolol to be sent to [facility] in [place] at [address].  [phone].  Patient does not have any medication at home.  Needs scripts as soon as possible. Would like a call once scripts have been sent to [phone].

## 2015-08-28 ENCOUNTER — Other Ambulatory Visit: Payer: Self-pay | Admitting: Geriatric Medicine

## 2015-08-28 MED ORDER — LEVOTHYROXINE SODIUM 50 MCG PO TABS: 50.0000 ug | ORAL_TABLET | Freq: Every day | ORAL | Status: DC

## 2015-08-28 MED ORDER — LOSARTAN POTASSIUM 25 MG PO TABS: 25.0000 mg | ORAL_TABLET | Freq: Every day | ORAL | Status: DC

## 2015-08-28 MED ORDER — METOPROLOL SUCCINATE ER 25 MG PO TB24: 25.0000 mg | ORAL_TABLET | Freq: Every day | ORAL | Status: DC

## 2015-09-05 ENCOUNTER — Telehealth: Payer: Self-pay | Admitting: Internal Medicine

## 2015-09-05 NOTE — Telephone Encounter (Signed)
States patient is moving back to [place] this weekend from [employer] (where she was living with her son).  Is requesting PT and OT home health orders for home safety assessment.  Central fax number is [phone].  States Yolanda Bonine and his wife will now be caregivers, Marca Ancona III and Mallie Snooks at phone number [phone].

## 2015-09-05 NOTE — Telephone Encounter (Signed)
If they are currently getting home health in [place] they should be able to move addresses with that company. Okay to give verbal orders.

## 2015-09-06 NOTE — Telephone Encounter (Signed)
Left message for patient to call back  

## 2015-09-13 ENCOUNTER — Telehealth: Payer: Self-pay | Admitting: Internal Medicine

## 2015-09-13 NOTE — Telephone Encounter (Signed)
Left message for patient's son informing him to have the home health agency call us with the plan of care and we can approve it.

## 2015-09-13 NOTE — Telephone Encounter (Signed)
Pt husband in and would like Dr Sharlet Salina to sent in for pt to continue home heath .  Is she able to do this without pt coming back in?

## 2015-09-21 ENCOUNTER — Telehealth: Payer: Self-pay | Admitting: Internal Medicine

## 2015-09-21 NOTE — Telephone Encounter (Signed)
Kathryn Whitaker from [facility] is requesting home health orders for PT & OT & Speech to eval and treat She's back in town and she needs to resume these along with home health aid to assist ADL's  Fax # [phone]

## 2015-09-21 NOTE — Telephone Encounter (Signed)
Left message on voice mail giving verbal orders.  

## 2015-09-27 ENCOUNTER — Telehealth: Payer: Self-pay | Admitting: Internal Medicine

## 2015-09-27 NOTE — Telephone Encounter (Signed)
Kathryn Whitaker is calling to get verbals for PT for strengthening gait and xfer traning  2 week 4

## 2015-09-27 NOTE — Telephone Encounter (Signed)
Called and gave verbal orders to [facility].

## 2015-09-29 ENCOUNTER — Ambulatory Visit: Payer: Medicare Other | Admitting: Family

## 2015-09-30 ENCOUNTER — Emergency Department (HOSPITAL_COMMUNITY)
Admission: EM | Admit: 2015-09-30 | Discharge: 2015-10-01 | Disposition: A | Discharge status: Home / Self Care | Payer: Medicare Other | Attending: Emergency Medicine | Admitting: Emergency Medicine

## 2015-09-30 ENCOUNTER — Encounter (HOSPITAL_COMMUNITY): Payer: Self-pay | Admitting: Emergency Medicine

## 2015-09-30 DIAGNOSIS — Z8709 Personal history of other diseases of the respiratory system: Secondary | ICD-10-CM | POA: Diagnosis not present

## 2015-09-30 DIAGNOSIS — Z86010 Personal history of colonic polyps: Secondary | ICD-10-CM | POA: Insufficient documentation

## 2015-09-30 DIAGNOSIS — K5900 Constipation, unspecified: Secondary | ICD-10-CM

## 2015-09-30 DIAGNOSIS — Z7982 Long term (current) use of aspirin: Secondary | ICD-10-CM | POA: Insufficient documentation

## 2015-09-30 DIAGNOSIS — Z8659 Personal history of other mental and behavioral disorders: Secondary | ICD-10-CM | POA: Diagnosis not present

## 2015-09-30 DIAGNOSIS — R109 Unspecified abdominal pain: Secondary | ICD-10-CM | POA: Diagnosis present

## 2015-09-30 DIAGNOSIS — Z90710 Acquired absence of both cervix and uterus: Secondary | ICD-10-CM | POA: Insufficient documentation

## 2015-09-30 DIAGNOSIS — K5641 Fecal impaction: Secondary | ICD-10-CM | POA: Diagnosis not present

## 2015-09-30 DIAGNOSIS — Z79899 Other long term (current) drug therapy: Secondary | ICD-10-CM | POA: Diagnosis not present

## 2015-09-30 DIAGNOSIS — Z8639 Personal history of other endocrine, nutritional and metabolic disease: Secondary | ICD-10-CM | POA: Diagnosis not present

## 2015-09-30 DIAGNOSIS — Z87440 Personal history of urinary (tract) infections: Secondary | ICD-10-CM | POA: Diagnosis not present

## 2015-09-30 DIAGNOSIS — I10 Essential (primary) hypertension: Secondary | ICD-10-CM | POA: Insufficient documentation

## 2015-09-30 MED ORDER — MORPHINE SULFATE (PF) 4 MG/ML IV SOLN
4.0000 mg | Freq: Once | INTRAVENOUS | Status: AC
  Administered 2015-09-30: 4 mg via INTRAVENOUS
  Filled 2015-09-30: qty 1

## 2015-09-30 MED ORDER — SORBITOL 70 % SOLN
960.0000 mL | TOPICAL_OIL | Freq: Once | ORAL | Status: AC
  Administered 2015-10-01: 960 mL via RECTAL
  Filled 2015-09-30: qty 240

## 2015-09-30 NOTE — ED Notes (Signed)
Bed: [id number] Expected date:  Expected time:  Means of arrival:  Comments: EMS 40F

## 2015-09-30 NOTE — ED Provider Notes (Signed)
CSN: [id number]     Arrival date & time 09/30/15  2110 History   First MD Initiated Contact with Patient 09/30/15 2126     Chief Complaint  Patient presents with  . Abdominal Pain  . Rectal Pain     (Consider location/radiation/quality/duration/timing/severity/associated sxs/prior Treatment) HPI Patient ports that she has been unable to have a bowel movement for 4 days. She reports that she's got a lot of pressure in her rectum and she has been trying to go but she can't get stool to pass. He reports that the pain becomes sharp when she has to have a bowel movement and then it eases off temporarily for a while. Today she has tried multiple times to have a bowel movement but only some thin stool will pass. No vomiting. Patient has failed to eat and drink today. No fever no chills. She denies pain burning or difficulty urination. Past Medical History  Diagnosis Date  . Allergic rhinitis   . Acute asthmatic bronchitis   . Hypertension   . Hypercholesteremia   . GERD (gastroesophageal reflux disease)   . Diverticulosis of colon   . IBS (irritable bowel syndrome)   . History of colonic polyps   . UTI (lower urinary tract infection)   . DJD (degenerative joint disease)   . Lumbar back pain   . Lacunar infarction ([place])   . Vitamin D deficiency   . Anxiety    Past Surgical History  Procedure Laterality Date  . Abdominal hysterectomy     No family history on file. Social History  Substance Use Topics  . Smoking status: Never Smoker   . Smokeless tobacco: Never Used  . Alcohol Use: No   OB History    No data available     Review of Systems 10 Systems reviewed and are negative for acute change except as noted in the HPI.    Allergies  Demerol; Influenza vaccines; Morphine and related; Peanuts; Prevpac; and Telithromycin  Home Medications   Prior to Admission medications   Medication Sig Start Date End Date Taking? Authorizing Provider  albuterol (PROVENTIL HFA) 108 (90  BASE) MCG/ACT inhaler Inhale 2 puffs into the lungs every 6 (six) hours as needed. For shortness of breath 06/27/14  Yes Delfina Redwood, MD  aspirin 81 MG tablet Take 81 mg by mouth daily.   Yes Historical Provider, MD  budesonide-formoterol (SYMBICORT) 80-4.5 MCG/ACT inhaler Inhale 2 puffs into the lungs 2 (two) times daily as needed (wheezing).   Yes Historical Provider, MD  docusate sodium (COLACE) 100 MG capsule Take 1 capsule (100 mg total) by mouth every 12 (twelve) hours. 11/24/13  Yes Sherwood Gambler, MD  Emollient (EUCERIN) lotion Apply 1 mL topically 3 (three) times daily as needed. as needed for moisturizer.   Yes Historical Provider, MD  levothyroxine (SYNTHROID, LEVOTHROID) 50 MCG tablet Take 1 tablet (50 mcg total) by mouth daily before breakfast. 08/28/15  Yes Hoyt Koch, MD  losartan (COZAAR) 25 MG tablet Take 1 tablet (25 mg total) by mouth daily. 08/28/15  Yes Hoyt Koch, MD  metoprolol succinate (TOPROL XL) 25 MG 24 hr tablet Take 1 tablet (25 mg total) by mouth daily. 08/28/15  Yes Hoyt Koch, MD  Multiple Vitamins-Minerals (MULTIVITAMIN WITH MINERALS) tablet Take 1 tablet by mouth daily.   Yes Historical Provider, MD  naproxen sodium (ANAPROX) 220 MG tablet Take 220 mg by mouth 2 (two) times daily as needed (pain).   Yes Historical Provider, MD  acetaminophen (  TYLENOL) 325 MG tablet Take 2 tablets (650 mg total) by mouth every 6 (six) hours as needed. Patient not taking: Reported on 09/30/2015 12/14/12   Venetia Maxon Rama, MD  doxycycline (VIBRAMYCIN) 100 MG capsule Take 1 capsule (100 mg total) by mouth 2 (two) times daily. Patient not taking: Reported on 09/30/2015 08/09/15   Hoyt Koch, MD  magnesium citrate SOLN Take 296 mLs (1 Bottle total) by mouth once. Drink half of the bottle tomorrow morning [id number]), if you have not had a bowel movement after 6 hours, drink the other half of the bottle. 10/01/15   Charlesetta Shanks, MD  polyethylene glycol  (MIRALAX / GLYCOLAX) packet Take 17 g by mouth daily. 10/01/15   Charlesetta Shanks, MD   BP 162/82 mmHg  Pulse 54  Temp(Src) 98.1 F (36.7 C) (Oral)  Resp 18  SpO2 100% Physical Exam  Constitutional: She is oriented to person, place, and time. She appears well-developed and well-nourished.  Patient is extremely thin but well conditioned and good mobility for 82 years of age. clear mental status no respiratory distress  HENT:  Head: Normocephalic and atraumatic.  Eyes: EOM are normal. Pupils are equal, round, and reactive to light.  Neck: Neck supple.  Cardiovascular: Normal rate, regular rhythm, normal heart sounds and intact distal pulses.   Pulmonary/Chest: Effort normal and breath sounds normal.  Abdominal: Soft. Bowel sounds are normal. She exhibits no distension. There is tenderness.  Moderate diffuse lower abdominal discomfort to palpation. No guarding or rebound.  Genitourinary:  Dense stool impacted in the rectal vault.  Musculoskeletal: Normal range of motion. She exhibits no edema or tenderness.  Neurological: She is alert and oriented to person, place, and time. She has normal strength. Coordination normal. GCS eye subscore is 4. GCS verbal subscore is 5. GCS motor subscore is 6.  Skin: Skin is warm, dry and intact.  Psychiatric: She has a normal mood and affect.    ED Course  Procedures (including critical care time) Labs Review Labs Reviewed - No data to display  Imaging Review No results found. I have personally reviewed and evaluated these images and lab results as part of my medical decision-making.   EKG Interpretation None      Manual disimpaction undertaken along with installation of smog enema with coud Foley catheter. Firm stool has been removed.  MDM   Final diagnoses:  Constipation, unspecified constipation type  Fecal impaction [facility])   Patient presents with 4 days of constipation. She does have fecal impaction present. This was digitally removed and  also smog enema used. The patient reported provement after passage of stool. She is alert and nontoxic. Abdominal examination is nonsurgical. She has had CT scan within the past 4 months that showed no obstruction or acute surgical etiology. There was possible proctitis. At this time, aside from the patient having fecal impaction I doubt active proctitis she is nontoxic and otherwise well in appearance. Patient is counseled on signs and symptoms for which to return. Plan will be to try magnesium citrate and MiraLAX at home tomorrow with follow-up on Monday. She is advised if she develops fever or worsening abdominal pain or other concerns she is to return to the emergency department.    Charlesetta Shanks, MD 10/01/15 [phone]

## 2015-09-30 NOTE — ED Notes (Signed)
MD at bedside. 

## 2015-09-30 NOTE — ED Notes (Signed)
Pt BIB EMS. Pt is from home. She states she has had abdominal pain and rectal pain since today. She reports her last BM was 4 days ago. She has a hx of impaction. She is alert, oriented. Family at bedside.

## 2015-10-01 DIAGNOSIS — K5641 Fecal impaction: Secondary | ICD-10-CM | POA: Diagnosis not present

## 2015-10-01 MED ORDER — MORPHINE SULFATE (PF) 4 MG/ML IV SOLN
4.0000 mg | Freq: Once | INTRAVENOUS | Status: AC
  Administered 2015-10-01: 4 mg via INTRAVENOUS

## 2015-10-01 MED ORDER — MAGNESIUM CITRATE PO SOLN: 1.0000 | Freq: Once | ORAL | Status: DC

## 2015-10-01 MED ORDER — POLYETHYLENE GLYCOL 3350 17 G PO PACK: 17.0000 g | PACK | Freq: Every day | ORAL | Status: DC

## 2015-10-01 MED ORDER — MORPHINE SULFATE (PF) 4 MG/ML IV SOLN
INTRAVENOUS | Status: AC
  Filled 2015-10-01: qty 1

## 2015-10-01 NOTE — Discharge Instructions (Signed)

## 2015-10-02 ENCOUNTER — Encounter: Payer: Self-pay | Admitting: Internal Medicine

## 2015-10-02 ENCOUNTER — Ambulatory Visit (INDEPENDENT_AMBULATORY_CARE_PROVIDER_SITE_OTHER): Payer: Medicare Other | Admitting: Internal Medicine

## 2015-10-02 VITALS — BP 136/74 | Temp 97.4°F | Resp 16 | Wt 98.0 lb

## 2015-10-02 DIAGNOSIS — K5909 Other constipation: Secondary | ICD-10-CM | POA: Diagnosis not present

## 2015-10-02 NOTE — Progress Notes (Signed)
Pre visit review using our clinic review tool, if applicable. No additional management support is needed unless otherwise documented below in the visit note. 

## 2015-10-02 NOTE — Progress Notes (Signed)
Subjective:    Patient ID: Kathryn Whitaker, female    DOB: [birth year], 82 y.o.   MRN: [id number]  HPI She is here for an acute visit for rectal pain. Her son brought her for the appointment, but is waiting. She does have some baseline dementia and inability history from her is difficult. I am unsure what her baseline has as I have not seen her previously.  2 days ago she went to the emergency room for abdominal pain and rectal pain. Currently emergency room notes she has not had a bowel movement for 4 days and was experiencing pressure in the rectum as well as abdominal pain. She was experiencing sharp pain in the rectal area when she tried to have a bowel movement and this was intermittent. She was able to pass some stool, but it was limited amount. She was experiencing vomiting, fevers, chills or urinary symptoms.  No blood work or imaging was done. She was found to have fecal impaction and after removing stool digitally and using a small enema she had improvement of her symptoms. Active proctitis was not thought to be the case given that she was not toxic appearing she was prescribed magnesium citrate and MiraLAX and was advised close follow-up. She states her medications have not been filled. She is still experiencing some constipation and lower abdominal discomfort.  She currently lives with her son. She is not able to tell me why her medication has not been filled yet.  She was advised to stop Colace, which she had been taking regularly.     Medications and allergies reviewed with patient and updated if appropriate.  Patient Active Problem List   Diagnosis Date Noted  . Dysuria 08/10/2015  . Hypothyroidism 06/06/2015  . Delirium 06/03/2015  . Protein-calorie malnutrition, severe ([place]) 06/26/2014  . Constipation 12/17/2013  . COPD (chronic obstructive pulmonary disease) ([place]) 06/03/2013  . IBS (irritable bowel syndrome) 12/30/2012  . Generalized anxiety disorder 12/30/2012  .  Dyslipidemia 12/12/2012  . HTN (hypertension) 12/12/2012  . Hx of completed stroke 07/11/2009    Current Outpatient Prescriptions on File Prior to Visit  Medication Sig Dispense Refill  . acetaminophen (TYLENOL) 325 MG tablet Take 2 tablets (650 mg total) by mouth every 6 (six) hours as needed.    Marland Kitchen albuterol (PROVENTIL HFA) 108 (90 BASE) MCG/ACT inhaler Inhale 2 puffs into the lungs every 6 (six) hours as needed. For shortness of breath 1 Inhaler 0  . aspirin 81 MG tablet Take 81 mg by mouth daily.    . budesonide-formoterol (SYMBICORT) 80-4.5 MCG/ACT inhaler Inhale 2 puffs into the lungs 2 (two) times daily as needed (wheezing).    Marland Kitchen docusate sodium (COLACE) 100 MG capsule Take 1 capsule (100 mg total) by mouth every 12 (twelve) hours. 60 capsule 0  . doxycycline (VIBRAMYCIN) 100 MG capsule Take 1 capsule (100 mg total) by mouth 2 (two) times daily. 14 capsule 0  . Emollient (EUCERIN) lotion Apply 1 mL topically 3 (three) times daily as needed. as needed for moisturizer.    Marland Kitchen levothyroxine (SYNTHROID, LEVOTHROID) 50 MCG tablet Take 1 tablet (50 mcg total) by mouth daily before breakfast. 30 tablet 5  . losartan (COZAAR) 25 MG tablet Take 1 tablet (25 mg total) by mouth daily. 30 tablet 5  . magnesium citrate SOLN Take 296 mLs (1 Bottle total) by mouth once. Drink half of the bottle tomorrow morning [id number]), if you have not had a bowel movement after 6 hours, drink the  other half of the bottle. 195 mL 0  . metoprolol succinate (TOPROL XL) 25 MG 24 hr tablet Take 1 tablet (25 mg total) by mouth daily. 30 tablet 5  . Multiple Vitamins-Minerals (MULTIVITAMIN WITH MINERALS) tablet Take 1 tablet by mouth daily.    . naproxen sodium (ANAPROX) 220 MG tablet Take 220 mg by mouth 2 (two) times daily as needed (pain).    . polyethylene glycol (MIRALAX / GLYCOLAX) packet Take 17 g by mouth daily. 14 each 3   No current facility-administered medications on file prior to visit.    Past Medical  History  Diagnosis Date  . Allergic rhinitis   . Acute asthmatic bronchitis   . Hypertension   . Hypercholesteremia   . GERD (gastroesophageal reflux disease)   . Diverticulosis of colon   . IBS (irritable bowel syndrome)   . History of colonic polyps   . UTI (lower urinary tract infection)   . DJD (degenerative joint disease)   . Lumbar back pain   . Lacunar infarction ([place])   . Vitamin D deficiency   . Anxiety     Past Surgical History  Procedure Laterality Date  . Abdominal hysterectomy      Social History   Social History  . Marital Status: Widowed    Spouse Name: N/A  . Number of Children: 6  . Years of Education: N/A   Social History Main Topics  . Smoking status: Never Smoker   . Smokeless tobacco: Never Used  . Alcohol Use: No  . Drug Use: No  . Sexual Activity: Not on file   Other Topics Concern  . Not on file   Social History Narrative    No family history on file.  Review of Systems  Constitutional: Negative for fever and appetite change.  Respiratory: Negative for cough, shortness of breath and wheezing.   Cardiovascular: Positive for chest pain (sometimes). Negative for palpitations and leg swelling.  Gastrointestinal: Positive for abdominal pain (mild in lower abdomen). Negative for nausea.       No BM since being in the ED  Neurological: Positive for headaches (mild).       Objective:   Filed Vitals:   10/02/15 1620  BP: 136/74  Temp: 97.4 F (36.3 C)  Resp: 16   Filed Weights   10/02/15 1620  Weight: 98 lb (44.453 kg)   Body mass index is 17.36 kg/(m^2).   Physical Exam Constitutional: Appears well-developed and well-nourished. No distress.  Neck: Neck supple. No tracheal deviation present. No thyromegaly present.  No carotid bruit. No cervical adenopathy.   Cardiovascular: Normal rate, regular rhythm and normal heart sounds.   No murmur heard.  No edema Pulmonary/Chest: Effort normal and breath sounds normal. No respiratory  distress. No wheezes.  Abdomen: soft, mild tenderness in lower abdomen without rebound or guarding        Assessment & Plan:   See Problem List for Assessment and Plan of chronic medical problems.   Follow-up with Dr. Sharlet Salina in the next weeks

## 2015-10-02 NOTE — Assessment & Plan Note (Signed)
Acute on chronic constipation In the emergency room 2 days ago with fecal impaction, abdominal pain and rectal pain Some of her stool was digitally removed and her symptoms improved Prescribed magnesium citrate and MiraLAX-prescriptions have not been filled and she has not taken them She is still experiencing constipation with lower abdominal pain that is mild Recommended that she get the medication filled and take it.  Hold Colace for now, but will likely need to restart with possible additional medication or specific bowel regimen Follow-up in a couple of weeks, sooner if needed Call with any questions or concerns

## 2015-10-02 NOTE — Patient Instructions (Addendum)
Take medication prescribed from the ED - you should take that today, no later than tomorrow.  If you are not able to take it by tomorrow please call for further instructions.   Follow up with Dr. Sharlet Salina soon

## 2015-10-16 ENCOUNTER — Telehealth: Payer: Self-pay | Admitting: Internal Medicine

## 2015-10-16 NOTE — Telephone Encounter (Signed)
kristen is calling to get a copy of the most recent office note faxed over. Please fax to # attached

## 2015-10-20 NOTE — Telephone Encounter (Signed)
Faxed most recent office note

## 2015-10-23 ENCOUNTER — Encounter: Payer: Self-pay | Admitting: Internal Medicine

## 2015-10-23 ENCOUNTER — Ambulatory Visit (INDEPENDENT_AMBULATORY_CARE_PROVIDER_SITE_OTHER): Payer: Medicare Other | Admitting: Internal Medicine

## 2015-10-23 VITALS — BP 130/78 | HR 62 | Temp 98.1°F | Resp 16 | Ht 60.0 in | Wt 96.0 lb

## 2015-10-23 DIAGNOSIS — K5909 Other constipation: Secondary | ICD-10-CM

## 2015-10-23 MED ORDER — SENNOSIDES-DOCUSATE SODIUM 8.6-50 MG PO TABS: 1.0000 | ORAL_TABLET | Freq: Two times a day (BID) | ORAL | Status: DC

## 2015-10-23 MED ORDER — MAGNESIUM CITRATE PO SOLN: 1.0000 | Freq: Once | ORAL | Status: DC

## 2015-10-23 NOTE — Assessment & Plan Note (Signed)
Magnesium citrate did work well last time and she is in need again. If no bowel movement then they can try suppository. They need to start taking senokot-s BID as well to help with motility. She is eating still and BS normal making obstruction unlikely. She does not want to take miralax. She is not drinking enough fluids and talked to them about how that can worsen constipation.

## 2015-10-23 NOTE — Progress Notes (Signed)
Pre visit review using our clinic review tool, if applicable. No additional management support is needed unless otherwise documented below in the visit note. 

## 2015-10-23 NOTE — Patient Instructions (Signed)
We have sent in more of the magnesium citrate to drink today to help you move your bowels, if you have not moved the bowels by tomorrow then take the other half of the bottle.   We have also sent in senokot-s to help with the constipation that you can start taking today. Take 1 pill twice daily every day to help keep the bowels moving.   Work on drinking more water or liquids to help keep the bowels moving.   Constipation, Adult Constipation is when a person has fewer than three bowel movements a week, has difficulty having a bowel movement, or has stools that are dry, hard, or larger than normal. As people grow older, constipation is more common. A low-fiber diet, not taking in enough fluids, and taking certain medicines may make constipation worse.  CAUSES   Certain medicines, such as antidepressants, pain medicine, iron supplements, antacids, and water pills.   Certain diseases, such as diabetes, irritable bowel syndrome (IBS), thyroid disease, or depression.   Not drinking enough water.   Not eating enough fiber-rich foods.   Stress or travel.   Lack of physical activity or exercise.   Ignoring the urge to have a bowel movement.   Using laxatives too much.  SIGNS AND SYMPTOMS   Having fewer than three bowel movements a week.   Straining to have a bowel movement.   Having stools that are hard, dry, or larger than normal.   Feeling full or bloated.   Pain in the lower abdomen.   Not feeling relief after having a bowel movement.  DIAGNOSIS  Your health care provider will take a medical history and perform a physical exam. Further testing may be done for severe constipation. Some tests may include:  A barium enema X-ray to examine your rectum, colon, and, sometimes, your small intestine.   A sigmoidoscopy to examine your lower colon.   A colonoscopy to examine your entire colon. TREATMENT  Treatment will depend on the severity of your constipation and  what is causing it. Some dietary treatments include drinking more fluids and eating more fiber-rich foods. Lifestyle treatments may include regular exercise. If these diet and lifestyle recommendations do not help, your health care provider may recommend taking over-the-counter laxative medicines to help you have bowel movements. Prescription medicines may be prescribed if over-the-counter medicines do not work.  HOME CARE INSTRUCTIONS   Eat foods that have a lot of fiber, such as fruits, vegetables, whole grains, and beans.  Limit foods high in fat and processed sugars, such as french fries, hamburgers, cookies, candies, and soda.   A fiber supplement may be added to your diet if you cannot get enough fiber from foods.   Drink enough fluids to keep your urine clear or pale yellow.   Exercise regularly or as directed by your health care provider.   Go to the restroom when you have the urge to go. Do not hold it.   Only take over-the-counter or prescription medicines as directed by your health care provider. Do not take other medicines for constipation without talking to your health care provider first.  [place] IF:   You have bright red blood in your stool.   Your constipation lasts for more than 4 days or gets worse.   You have abdominal or rectal pain.   You have thin, pencil-like stools.   You have unexplained weight loss. MAKE SURE YOU:   Understand these instructions.  Will watch your condition.  Will get help right away if you are not doing well or get worse.   This information is not intended to replace advice given to you by your health care provider. Make sure you discuss any questions you have with your health care provider.   Document Released: 03/08/2004 Document Revised: 07/01/2014 Document Reviewed: 03/22/2013 Elsevier Interactive Patient Education [employer].

## 2015-10-23 NOTE — Progress Notes (Signed)
   Subjective:    Patient ID: Kathryn Whitaker, female    DOB: 04/01/1920, 82 y.o.   MRN: [id number]  HPI The patient is a 82 YO female coming in for follow up of her chronic constipation. She is worse now than usual. After her last visit she took the magnesium citrate as directed by the doctor that saw her. She was able to move her bowels. She felt better and stopped taking everything for her bowels. She was supposed to be taking miralax daily but she is not. Her grandson is with her and helps to provide history. She does not like taking the liquid daily. She was doing okay taking colace but that was not adequate. She is still eating and drinking the same as usual. Some mild pain in her stomach and distention. No nausea. Still passing gas.   Review of Systems  Unable to perform ROS: Dementia  Constitutional: Negative for fever, activity change, appetite change and fatigue.  HENT: Negative for ear discharge, ear pain, sore throat and trouble swallowing.   Respiratory: Negative for chest tightness and wheezing.   Cardiovascular: Negative for chest pain, palpitations and leg swelling.  Gastrointestinal: Positive for abdominal pain, constipation and abdominal distention. Negative for nausea, vomiting, diarrhea and blood in stool.  Neurological: Negative.        Memory change      Objective:   Physical Exam  Constitutional: She appears well-developed and well-nourished.  Thin  HENT:  Head: Normocephalic and atraumatic.  Eyes: EOM are normal.  Neck: Normal range of motion.  Cardiovascular: Normal rate and regular rhythm.   Pulmonary/Chest: Effort normal and breath sounds normal. No respiratory distress. She has no wheezes. She has no rales.  Abdominal: Soft. Bowel sounds are normal. She exhibits distension. She exhibits no mass. There is tenderness. There is no rebound and no guarding.  Some distention but normal BS, mild tenderness diffusely without rebound or guarding.   Neurological: She  is alert. Coordination normal.  Skin: Skin is warm and dry.   Filed Vitals:   10/23/15 0950  BP: 130/78  Pulse: 62  Temp: 98.1 F (36.7 C)  TempSrc: Oral  Resp: 16  Height: 5' (1.524 m)  Weight: 96 lb (43.545 kg)  SpO2: 94%      Assessment & Plan:

## 2015-10-25 ENCOUNTER — Telehealth: Payer: Self-pay | Admitting: Internal Medicine

## 2015-10-25 DIAGNOSIS — H9201 Otalgia, right ear: Secondary | ICD-10-CM | POA: Insufficient documentation

## 2015-10-25 NOTE — Telephone Encounter (Signed)
Patient said her right ear has an odor, and it hurts. She would like a referral to an ENT doctor. Please advise, thanks.

## 2015-10-25 NOTE — Telephone Encounter (Signed)
Pt referral for ear specialist due to blood and order in ear. Please advise.

## 2015-10-25 NOTE — Telephone Encounter (Signed)
done

## 2016-01-23 ENCOUNTER — Other Ambulatory Visit (INDEPENDENT_AMBULATORY_CARE_PROVIDER_SITE_OTHER): Payer: Medicare Other

## 2016-01-23 ENCOUNTER — Encounter: Payer: Self-pay | Admitting: Internal Medicine

## 2016-01-23 ENCOUNTER — Ambulatory Visit (INDEPENDENT_AMBULATORY_CARE_PROVIDER_SITE_OTHER): Payer: Medicare Other | Admitting: Internal Medicine

## 2016-01-23 VITALS — BP 120/88 | HR 39 | Temp 97.9°F | Resp 12 | Ht 60.0 in | Wt 93.0 lb

## 2016-01-23 DIAGNOSIS — Z8673 Personal history of transient ischemic attack (TIA), and cerebral infarction without residual deficits: Secondary | ICD-10-CM | POA: Diagnosis not present

## 2016-01-23 DIAGNOSIS — E038 Other specified hypothyroidism: Secondary | ICD-10-CM

## 2016-01-23 DIAGNOSIS — H9201 Otalgia, right ear: Secondary | ICD-10-CM | POA: Diagnosis not present

## 2016-01-23 LAB — COMPREHENSIVE METABOLIC PANEL
ALK PHOS: 46 U/L (ref 39–117)
ALT: 6 U/L (ref 0–35)
AST: 12 U/L (ref 0–37)
Albumin: 3.5 g/dL (ref 3.5–5.2)
BILIRUBIN TOTAL: 0.9 mg/dL (ref 0.2–1.2)
BUN: 14 mg/dL (ref 6–23)
CALCIUM: 9.3 mg/dL (ref 8.4–10.5)
CO2: 26 meq/L (ref 19–32)
CREATININE: 0.55 mg/dL (ref 0.40–1.20)
Chloride: 107 mEq/L (ref 96–112)
GFR: 132.26 mL/min (ref >60.00)
GLUCOSE: 79 mg/dL (ref 70–99)
Potassium: 3.9 mEq/L (ref 3.5–5.1)
Sodium: 141 mEq/L (ref 135–145)
TOTAL PROTEIN: 6.7 g/dL (ref 6.0–8.3)

## 2016-01-23 LAB — CBC
HCT: 37.7 % (ref 36.0–46.0)
Hemoglobin: 12.6 g/dL (ref 12.0–15.0)
MCHC: 33.3 g/dL (ref 30.0–36.0)
MCV: 91.8 fl (ref 78.0–100.0)
PLATELETS: 219 10*3/uL (ref 150.0–400.0)
RBC: 4.11 Mil/uL (ref 3.87–5.11)
RDW: 14.1 % (ref 11.5–15.5)

## 2016-01-23 LAB — VITAMIN B12: VITAMIN B 12: 473 pg/mL (ref 211–911)

## 2016-01-23 LAB — TSH: TSH: 2.07 u[IU]/mL (ref 0.35–4.50)

## 2016-01-23 LAB — T4, FREE: FREE T4: 1.09 ng/dL (ref 0.60–1.60)

## 2016-01-23 MED ORDER — FLUTICASONE PROPIONATE 50 MCG/ACT NA SUSP: 2.0000 | Freq: Every day | NASAL | 6 refills | Status: DC

## 2016-01-23 NOTE — Patient Instructions (Signed)
We will check the labs today and call you back with the results.   We have given you the prescription for the foam doughnut for the backside.   We have sent in flonase to help with the allergies. Use 2 sprays in each nostril daily until the sinuses feel better.

## 2016-01-23 NOTE — Progress Notes (Signed)
   Subjective:    Patient ID: Kathryn Whitaker, female    DOB: 1920-01-09, 82 y.o.   MRN: [id number]  HPI The patient is a 82 YO female coming in for several concerns including her backside being bruised from sitting on it all day. She is able to walk from the bed to chair but not much more. She has done PT in the past and she still remembers the exercises but is not doing them as much due to tiredness. She is sitting in the same spot most of the day.  She is also having problems with her sinuses being painful and congested. Going on for an unknown amount of time (she could not recall). She is having no fevers or chills that she could recall. Mild cough but not frequent. Has not tried anything for it yet. Wants to avoid medication if at all possible.   Review of Systems  Unable to perform ROS: Dementia  Constitutional: Negative for activity change, appetite change, fatigue and fever.  HENT: Positive for congestion, ear pain, postnasal drip and sinus pressure. Negative for ear discharge, sore throat and trouble swallowing.   Eyes: Positive for pain.       Dry eyes  Respiratory: Positive for cough. Negative for chest tightness, shortness of breath and wheezing.   Cardiovascular: Negative for chest pain, palpitations and leg swelling.  Gastrointestinal: Negative for abdominal distention, abdominal pain, blood in stool, constipation, diarrhea, nausea and vomiting.  Musculoskeletal: Positive for gait problem and myalgias.  Skin: Positive for color change.  Neurological:       Memory change      Objective:   Physical Exam  Constitutional: She appears well-developed and well-nourished.  Thin  HENT:  Head: Normocephalic and atraumatic.  Pressure in the frontal sinuses and oropharynx with clear drainage and redness  Eyes: EOM are normal.  Neck: Normal range of motion.  Cardiovascular: Normal rate and regular rhythm.   Pulmonary/Chest: Effort normal and breath sounds normal. No respiratory  distress. She has no wheezes. She has no rales.  Abdominal: Soft. Bowel sounds are normal. She exhibits no distension. There is no tenderness. There is no rebound.  Neurological: She is alert. Coordination normal.  Skin: Skin is warm and dry.  Small bruise on the tailbone without skin breakdown.   Vitals:   01/23/16 1021  BP: 120/88  Pulse: (!) 39  Resp: 12  Temp: 97.9 F (36.6 C)  TempSrc: Oral  SpO2: 96%  Weight: 93 lb (42.2 kg)  Height: 5' (1.524 m)      Assessment & Plan:

## 2016-01-23 NOTE — Assessment & Plan Note (Signed)
Checking TSH and free T4 for energy problems and sleep problems. Adjust synthroid 50 mcg daily as needed.

## 2016-01-23 NOTE — Assessment & Plan Note (Signed)
Not able to walk much any longer and not doing her PT exercises. Rx given to them at visit for DME foam doughnut for pressure offloading.

## 2016-01-23 NOTE — Progress Notes (Signed)
Pre visit review using our clinic review tool, if applicable. No additional management support is needed unless otherwise documented below in the visit note. 

## 2016-01-23 NOTE — Assessment & Plan Note (Signed)
Having lots of sinus pressure, no wax obstruction. Rx for flonase given and talked to them about zyrtec to help with symptoms and could be related to poor sleep.

## 2016-02-18 ENCOUNTER — Other Ambulatory Visit: Payer: Self-pay | Admitting: Internal Medicine

## 2016-02-28 ENCOUNTER — Ambulatory Visit (INDEPENDENT_AMBULATORY_CARE_PROVIDER_SITE_OTHER): Payer: Medicare Other | Admitting: Podiatry

## 2016-02-28 ENCOUNTER — Encounter: Payer: Self-pay | Admitting: Podiatry

## 2016-02-28 VITALS — BP 134/72 | HR 46

## 2016-02-28 DIAGNOSIS — S90822D Blister (nonthermal), left foot, subsequent encounter: Secondary | ICD-10-CM

## 2016-02-28 DIAGNOSIS — M2010 Hallux valgus (acquired), unspecified foot: Secondary | ICD-10-CM

## 2016-02-28 DIAGNOSIS — B351 Tinea unguium: Secondary | ICD-10-CM

## 2016-02-28 DIAGNOSIS — M79606 Pain in leg, unspecified: Secondary | ICD-10-CM

## 2016-02-28 NOTE — Patient Instructions (Signed)
Seen for hypertrophic nails and calluses. All nails and calluses debrided. Made opening on toe box to accommodate rubbing toe. Return in 3 months or as needed.

## 2016-02-28 NOTE — Progress Notes (Signed)
Subjective: 82 y.o. year old female presents accompanied by her son complaining of raw spots in toes. Her PCP tole she has shoe irritation and fungal infection. Also has pain in between 4th and 5th digit right. Patient request toe nails trimmed.    OBJECTIVE:  DERMATOLOGIC EXAMINATION:  Thick yellow dystrophic nails x 10, symptomatic. Dry thick scaly cracking skin in between 4th and 5th digit right. No drainage, no edema or erythema.  There are multiple plantar calluses on balls and heels. Red shiny thick skin at distal dorsal surface of both great toes, symptomatic.  VASCULAR EXAMINATION OF LOWER LIMBS:  Pedal pulses: All pedal pulses are faintly palpable..  No edema or erythema. Temperature gradient from tibial crest to dorsum of foot is within normal bilateral.   NEUROLOGIC EXAMINATION OF THE LOWER LIMBS:  All epicritic and tactile sensations grossly intact.   ORTHOPEDIC: Multiple contracted lesser digits bilateral.  Enlarged bunion bilateral.  Long great toes with hyperextended first MPJ bilateral.  ASSESSMENT:  Painful plantar calluses plantar bilateral. Thick dystrophic and mycotic nails  x 10 symptomatic.  Painful interdigital corn 5th digit right. Hyperextended great toe with friction blister/callus with shoe irritation at distal dorsal end without open skin, symptomatic bilateral.  PLAN:  All nails, corns, and callused debrided.  Made modification on closed in shoe, opening made to accommodate hyperextended great toes. Advised to stay off of closed in shoes. Patient is to wear open toed shoes only.  Return 3 months or as needed.Marland Kitchen

## 2016-05-29 ENCOUNTER — Ambulatory Visit (INDEPENDENT_AMBULATORY_CARE_PROVIDER_SITE_OTHER): Payer: Medicare Other | Admitting: Podiatry

## 2016-05-29 ENCOUNTER — Encounter: Payer: Self-pay | Admitting: Podiatry

## 2016-05-29 DIAGNOSIS — B351 Tinea unguium: Secondary | ICD-10-CM

## 2016-05-29 DIAGNOSIS — M79671 Pain in right foot: Secondary | ICD-10-CM

## 2016-05-29 DIAGNOSIS — M79672 Pain in left foot: Secondary | ICD-10-CM

## 2016-05-29 NOTE — Progress Notes (Signed)
Subjective: 82 y.o. year old female presents accompanied by her son complaining of raw spots in toes. Her PCP tole she has shoe irritation and fungal infection. Also has pain in between 4th and 5th digit right. Patient request toe nails trimmed.   OBJECTIVE: DERMATOLOGIC EXAMINATION:  Thick yellow dystrophic nails x 10, symptomatic. VASCULAR EXAMINATION OF LOWER LIMBS:  Pedal pulses: All pedal pulses are faintly palpable..  No edema or erythema. Temperature gradient from tibial crest to dorsum of foot is within normal bilateral.  NEUROLOGIC EXAMINATION OF THE LOWER LIMBS:  All epicritic and tactile sensations grossly intact.  ORTHOPEDIC: Multiple contracted lesser digits bilateral.  Enlarged bunion bilateral.  Long great toes with hyperextended first MPJ bilateral.  ASSESSMENT: Painful plantar calluses plantar bilateral. Thick dystrophic and mycotic nails  x 10 symptomatic.  Pain in both feet with ambulation.  PLAN: All nails, corns, and callused debrided.  Continue to wear open toed shoes only.  Return 3 months or as needed.Marland Kitchen

## 2016-05-29 NOTE — Patient Instructions (Signed)
Seen for corns and hypertrophic nails. All nails and corns debrided. Vitamin A cream dispensed for dry skin. Return in 3 months or as needed.

## 2016-07-15 ENCOUNTER — Other Ambulatory Visit: Payer: Self-pay | Admitting: Internal Medicine

## 2016-07-26 ENCOUNTER — Encounter: Payer: Self-pay | Admitting: Internal Medicine

## 2016-07-26 ENCOUNTER — Ambulatory Visit (INDEPENDENT_AMBULATORY_CARE_PROVIDER_SITE_OTHER): Payer: Medicare Other | Admitting: Internal Medicine

## 2016-07-26 VITALS — BP 98/56 | HR 68 | Temp 97.5°F | Resp 12 | Ht 60.0 in | Wt 90.8 lb

## 2016-07-26 DIAGNOSIS — R300 Dysuria: Secondary | ICD-10-CM | POA: Diagnosis not present

## 2016-07-26 DIAGNOSIS — F0281 Dementia in other diseases classified elsewhere with behavioral disturbance: Secondary | ICD-10-CM

## 2016-07-26 DIAGNOSIS — F0391 Unspecified dementia with behavioral disturbance: Secondary | ICD-10-CM | POA: Insufficient documentation

## 2016-07-26 DIAGNOSIS — G301 Alzheimer's disease with late onset: Secondary | ICD-10-CM | POA: Diagnosis not present

## 2016-07-26 DIAGNOSIS — E43 Unspecified severe protein-calorie malnutrition: Secondary | ICD-10-CM

## 2016-07-26 DIAGNOSIS — F0392 Unspecified dementia, unspecified severity, with psychotic disturbance: Secondary | ICD-10-CM | POA: Insufficient documentation

## 2016-07-26 LAB — POC URINALSYSI DIPSTICK (AUTOMATED)
Blood, UA: NEGATIVE
GLUCOSE UA: NEGATIVE
Ketones, UA: NEGATIVE
LEUKOCYTES UA: NEGATIVE
Nitrite, UA: NEGATIVE
Spec Grav, UA: >=1.03
UROBILINOGEN UA: 1
pH, UA: 5.5

## 2016-07-26 MED ORDER — LORAZEPAM 0.5 MG PO TABS: 0.5000 mg | ORAL_TABLET | Freq: Two times a day (BID) | ORAL | 1 refills | Status: DC | PRN

## 2016-07-26 NOTE — Assessment & Plan Note (Signed)
Checked U/A today in the office due to symptoms and some change in behavior but no signs of infection. Some signs of dehydration on the U/A. Encouraged more water intake.

## 2016-07-26 NOTE — Assessment & Plan Note (Signed)
Some behavioral changes during the day and not sleeping at night time. Rx for ativan prn today. She has refused medication for her dementia in the past which is her right. We are focusing on palliative and QOL measures at this time.

## 2016-07-26 NOTE — Progress Notes (Signed)
Pre visit review using our clinic review tool, if applicable. No additional management support is needed unless otherwise documented below in the visit note. 

## 2016-07-26 NOTE — Progress Notes (Signed)
   Subjective:    Patient ID: Kathryn Whitaker, female    DOB: Sep 27, 1919, 83 y.o.   MRN: [id number]  HPI The patient is a 83 YO female coming in for problems with sleeping. Her relative is here with her today to help provide history. She is not able to fall asleep and she wakes up a lot during the night. She does have dementia and previously has not wanted to take medicine for it. She is tired during the day and naps. She does not move around a lot and has pain in her bottom from sitting in the same spot all the time.  Her family is also concerned about UTI as she has been more combative at home intermittently. She is having some mild burning with urination. Mild suprapubic tenderness. No fevers or chills.   Review of Systems  Unable to perform ROS: Dementia      Objective:   Physical Exam  Constitutional: She appears well-developed.  Malnourished with temporal wasting  HENT:  Head: Normocephalic and atraumatic.  Eyes: EOM are normal.  Cardiovascular: Normal rate and regular rhythm.   Pulmonary/Chest: Effort normal. No respiratory distress. She has no wheezes.  Abdominal: Soft. Bowel sounds are normal. She exhibits no distension. There is no tenderness. There is no rebound.  Neurological: She is alert. Coordination abnormal.  Slow gait  Skin: Skin is warm and dry.   Vitals:   07/26/16 0950  BP: (!) 98/56  Pulse: 68  Resp: 12  Temp: 97.5 F (36.4 C)  TempSrc: Oral  Weight: 90 lb 12 oz (41.2 kg)  Height: 5' (1.524 m)      Assessment & Plan:

## 2016-07-26 NOTE — Patient Instructions (Addendum)
We will have you stop taking metoprolol (also called toprol-xl) because your blood pressure is low today.   You can keep taking the losartan and the levothyroxine.   We have sent in a new medicine called ativan which you can take at night time for sleep.   Call and let us know how it is doing in a couple of weeks.

## 2016-07-26 NOTE — Assessment & Plan Note (Signed)
Weight is still going down and her appetite is poor. Given her advancing dementia she is likely entering more of a palliative stage and talked with family again about preserving her QOL and that her weight will likely continue to fall.

## 2016-07-26 NOTE — Addendum Note (Signed)
Addended by: Loren Racer B on: 07/26/2016 10:29 AM   Modules accepted: Orders

## 2016-07-29 ENCOUNTER — Ambulatory Visit: Payer: Medicare Other | Admitting: Internal Medicine

## 2016-08-01 ENCOUNTER — Ambulatory Visit: Payer: Medicare Other | Admitting: Internal Medicine

## 2016-08-27 ENCOUNTER — Ambulatory Visit: Payer: Medicare Other | Admitting: Podiatry

## 2016-09-03 ENCOUNTER — Ambulatory Visit: Payer: Medicare Other | Admitting: Podiatry

## 2016-09-23 ENCOUNTER — Ambulatory Visit (INDEPENDENT_AMBULATORY_CARE_PROVIDER_SITE_OTHER): Payer: Medicare Other | Admitting: Internal Medicine

## 2016-09-23 ENCOUNTER — Encounter: Payer: Self-pay | Admitting: Internal Medicine

## 2016-09-23 DIAGNOSIS — R300 Dysuria: Secondary | ICD-10-CM | POA: Diagnosis not present

## 2016-09-23 DIAGNOSIS — F0281 Dementia in other diseases classified elsewhere with behavioral disturbance: Secondary | ICD-10-CM

## 2016-09-23 DIAGNOSIS — G301 Alzheimer's disease with late onset: Secondary | ICD-10-CM | POA: Diagnosis not present

## 2016-09-23 DIAGNOSIS — F02818 Alzheimer's disease with late onset: Secondary | ICD-10-CM

## 2016-09-23 MED ORDER — FOSFOMYCIN TROMETHAMINE 3 G PO PACK: 3.0000 g | PACK | Freq: Once | ORAL | 0 refills | Status: AC

## 2016-09-23 NOTE — Assessment & Plan Note (Addendum)
Advised patient to take an ativan this evening as she has not slept in 24-48 hours now. She is likely having some worsening of her overall dementia and talked again with her family about the overall progression of her disease.

## 2016-09-23 NOTE — Assessment & Plan Note (Signed)
Given change in symptoms will treat empirically with fosfomycin (better compliance with one time dosing). Suspect some is worsening of dementia and not infection however if there is reversible cause should be treated.

## 2016-09-23 NOTE — Patient Instructions (Signed)
We have sent in the medicine for the bladder called fosfomycin.This is a powder that you mix with liquid and drink one time. This will cure the bladder infection.   Take the sleeping medicine ativan tonight to help sleep.

## 2016-09-23 NOTE — Progress Notes (Signed)
   Subjective:    Patient ID: Kathryn Whitaker, female    DOB: 08/19/1919, 83 y.o.   MRN: [id number]  HPI The patient is a 83 YO female coming in for continuing problems with sleep and with dysuria. She is demented and not able to provide history. Her grandson is with her today and helps to provide history. She was not sleeping well last night and was up all night. She refused to take an ativan and then was so worked up that it did not work well. They have used it 1 other time since getting it and it worked okay then. She is not able to give a urine sample today (missed the hat in toilet). Has burning with urination and some increase in frequency.   Review of Systems  Unable to perform ROS: Dementia      Objective:   Physical Exam  Constitutional: She appears well-developed and well-nourished.  Thin  HENT:  Head: Normocephalic and atraumatic.  Eyes: EOM are normal.  Neck: Normal range of motion.  Cardiovascular: Normal rate and regular rhythm.   Pulmonary/Chest: Effort normal and breath sounds normal.  Abdominal: Soft. Bowel sounds are normal. She exhibits no distension. There is no tenderness. There is no rebound.  Neurological: She is alert.  Skin: Skin is warm and dry.  Psychiatric:  Not talking sensibly but at baseline.   Vitals:   09/23/16 1514  BP: 132/82  Pulse: 89  Resp: 14  Temp: 98.7 F (37.1 C)  TempSrc: Oral  SpO2: 96%  Weight: 93 lb 12.8 oz (42.5 kg)  Height: 5' (1.524 m)      Assessment & Plan:

## 2016-11-22 ENCOUNTER — Encounter (HOSPITAL_COMMUNITY): Payer: Self-pay | Admitting: Emergency Medicine

## 2016-11-22 ENCOUNTER — Emergency Department (HOSPITAL_COMMUNITY): Payer: Medicare Other

## 2016-11-22 ENCOUNTER — Emergency Department (HOSPITAL_COMMUNITY)
Admission: EM | Admit: 2016-11-22 | Discharge: 2016-11-22 | Disposition: A | Discharge status: Home / Self Care | Payer: Medicare Other | Attending: Emergency Medicine | Admitting: Emergency Medicine

## 2016-11-22 DIAGNOSIS — I10 Essential (primary) hypertension: Secondary | ICD-10-CM | POA: Diagnosis not present

## 2016-11-22 DIAGNOSIS — F0390 Unspecified dementia without behavioral disturbance: Secondary | ICD-10-CM | POA: Diagnosis not present

## 2016-11-22 DIAGNOSIS — K5641 Fecal impaction: Secondary | ICD-10-CM

## 2016-11-22 DIAGNOSIS — K6289 Other specified diseases of anus and rectum: Secondary | ICD-10-CM | POA: Insufficient documentation

## 2016-11-22 DIAGNOSIS — K5900 Constipation, unspecified: Secondary | ICD-10-CM

## 2016-11-22 DIAGNOSIS — J45902 Unspecified asthma with status asthmaticus: Secondary | ICD-10-CM | POA: Diagnosis not present

## 2016-11-22 DIAGNOSIS — Z7982 Long term (current) use of aspirin: Secondary | ICD-10-CM | POA: Insufficient documentation

## 2016-11-22 HISTORY — DX: Unspecified protein-calorie malnutrition: E46

## 2016-11-22 HISTORY — DX: Alzheimer's disease, unspecified: G30.9

## 2016-11-22 HISTORY — DX: Other constipation: K59.09

## 2016-11-22 HISTORY — DX: Dementia in other diseases classified elsewhere, unspecified severity, without behavioral disturbance, psychotic disturbance, mood disturbance, and anxiety: F02.80

## 2016-11-22 LAB — POC OCCULT BLOOD, ED: FECAL OCCULT BLD: NEGATIVE

## 2016-11-22 LAB — URINALYSIS, ROUTINE W REFLEX MICROSCOPIC
Bilirubin Urine: NEGATIVE
GLUCOSE, UA: NEGATIVE mg/dL
Ketones, ur: 5 mg/dL — AB
Nitrite: NEGATIVE
PH: 6 (ref 5.0–8.0)
PROTEIN: 30 mg/dL — AB
SPECIFIC GRAVITY, URINE: 1.017 (ref 1.005–1.030)

## 2016-11-22 MED ORDER — METOPROLOL TARTRATE 25 MG PO TABS
25.0000 mg | ORAL_TABLET | Freq: Once | ORAL | Status: AC
  Administered 2016-11-22: 25 mg via ORAL
  Filled 2016-11-22: qty 1

## 2016-11-22 MED ORDER — LOSARTAN POTASSIUM 25 MG PO TABS
25.0000 mg | ORAL_TABLET | Freq: Once | ORAL | Status: AC
  Administered 2016-11-22: 25 mg via ORAL
  Filled 2016-11-22: qty 1

## 2016-11-22 NOTE — ED Notes (Signed)
Pt had large bowel movement. Did not urinate any in hat during bathroom trip.

## 2016-11-22 NOTE — ED Notes (Addendum)
Pt states she has not taken her blood pressure medicines today.  Dr. Thurnell Garbe made aware of pt's BP and pt stating she has not had her BP meds today.

## 2016-11-22 NOTE — ED Provider Notes (Signed)
[facility] DEPT Provider Note   CSN: [id number] Arrival date & time: 11/22/16  1400     History   Chief Complaint Chief Complaint  Patient presents with  . Rectal Pain    HPI Kathryn Whitaker is a 83 y.o. female.  The history is provided by the patient. The history is limited by the condition of the patient (Hx dementia).  Pt was seen at 1510. Per pt, c/o gradual onset and persistence of rectal "pain" for an unknown period of time. Pt staes "it's like the last time I was here." Pt states she "was constipated and took something to make me go." Pt had BM in waiting room before coming back to ED exam room. Pt states she still feels "pressure" in her rectum. Pt has significant hx of dementia, chronic constipation and fecal impaction.   Past Medical History:  Diagnosis Date  . Acute asthmatic bronchitis   . Allergic rhinitis   . Alzheimer's dementia   . Anxiety   . Chronic constipation   . Diverticulosis of colon   . DJD (degenerative joint disease)   . GERD (gastroesophageal reflux disease)   . History of colonic polyps   . Hypercholesteremia   . Hypertension   . IBS (irritable bowel syndrome)   . Lacunar infarction ([place])   . Lumbar back pain   . Malnutrition ([place])   . UTI (lower urinary tract infection)   . Vitamin D deficiency     Patient Active Problem List   Diagnosis Date Noted  . Dementia 07/26/2016  . Dysuria 08/10/2015  . Hypothyroidism 06/06/2015  . Protein-calorie malnutrition, severe ([place]) 06/26/2014  . Constipation 12/17/2013  . COPD (chronic obstructive pulmonary disease) ([place]) 06/03/2013  . IBS (irritable bowel syndrome) 12/30/2012  . Generalized anxiety disorder 12/30/2012  . Dyslipidemia 12/12/2012  . HTN (hypertension) 12/12/2012  . Hx of completed stroke 07/11/2009    Past Surgical History:  Procedure Laterality Date  . ABDOMINAL HYSTERECTOMY      OB History    No data available       Home Medications    Prior to Admission  medications   Medication Sig Start Date End Date Taking? Authorizing Provider  acetaminophen (TYLENOL) 325 MG tablet Take 2 tablets (650 mg total) by mouth every 6 (six) hours as needed. 12/14/12   Rama, Venetia Maxon, MD  albuterol (PROVENTIL HFA) 108 (90 BASE) MCG/ACT inhaler Inhale 2 puffs into the lungs every 6 (six) hours as needed. For shortness of breath 06/27/14   Delfina Redwood, MD  aspirin 81 MG tablet Take 81 mg by mouth daily.    [provider]  budesonide-formoterol (SYMBICORT) 80-4.5 MCG/ACT inhaler Inhale 2 puffs into the lungs 2 (two) times daily as needed (wheezing).    [provider]  fluticasone (FLONASE) 50 MCG/ACT nasal spray Place 2 sprays into both nostrils daily. 01/23/16   Hoyt Koch, MD  levothyroxine (SYNTHROID, LEVOTHROID) 50 MCG tablet TAKE 1 TABLET (50 MCG TOTAL) BY MOUTH DAILY BEFORE BREAKFAST. 07/15/16   Hoyt Koch, MD  LORazepam (ATIVAN) 0.5 MG tablet Take 1 tablet (0.5 mg total) by mouth 2 (two) times daily as needed for anxiety. 07/26/16   Hoyt Koch, MD  losartan (COZAAR) 25 MG tablet TAKE 1 TABLET (25 MG TOTAL) BY MOUTH DAILY. 07/15/16   Hoyt Koch, MD  Multiple Vitamins-Minerals (MULTIVITAMIN WITH MINERALS) tablet Take 1 tablet by mouth daily.    [provider]  senna-docusate (SENOKOT-S) 8.6-50 MG tablet Take  1 tablet by mouth 2 (two) times daily. 10/23/15   Hoyt Koch, MD    Family History No family history on file.  Social History Social History  Substance Use Topics  . Smoking status: Never Smoker  . Smokeless tobacco: Never Used  . Alcohol use No     Allergies   Demerol [meperidine]; Influenza vaccines; Morphine and related; Peanuts [peanut oil]; Prevpac [amoxicill-clarithro-lansopraz]; and Telithromycin   Review of Systems Review of Systems  Unable to perform ROS: Dementia     Physical Exam Updated Vital Signs BP (!) 158/104 (BP Location: Left Arm)   Pulse  (!) 119   Temp 98.5 F (36.9 C) (Oral)   Resp (!) 24   SpO2 100%    BP (!) 154/98 (BP Location: Right Arm)   Pulse 94   Temp 98.2 F (36.8 C) (Oral)   Resp 18   SpO2 98%    Physical Exam 1515: Physical examination:  Nursing notes reviewed; Vital signs and O2 SAT reviewed;  Constitutional: Well developed, Well nourished, Well hydrated, In no acute distress; Head:  Normocephalic, atraumatic; Eyes: EOMI, PERRL, No scleral icterus; ENMT: Mouth and pharynx normal, Mucous membranes moist; Neck: Supple, Full range of motion, No lymphadenopathy; Cardiovascular: Regular rate and rhythm, No gallop; Respiratory: Breath sounds clear & equal bilaterally, No wheezes.  Speaking full sentences with ease, Normal respiratory effort/excursion; Chest: Nontender, Movement normal; Abdomen: Soft, +mild suprapubic tenderness to palp. No rebound or guarding. Nondistended, Normal bowel sounds. Rectal exam performed w/permission of pt and ED Tech chaperone present.  Anal tone normal.  Non-tender, soft light brown stool in rectal vault, heme neg. +fecal impaction. No fissures, no external hemorrhoids, no palp masses.; Genitourinary: No CVA tenderness; Extremities: Pulses normal, No tenderness, No edema, No calf edema or asymmetry.; Neuro: Awake, alert, confused re: events. No facial droop. Major CN grossly intact.  Speech clear. No gross focal motor or sensory deficits in extremities. Climbs on and off stretcher easily by herself. Gait steady.; Skin: Color normal, Warm, Dry.   ED Treatments / Results  Labs (all labs ordered are listed, but only abnormal results are displayed)   EKG  EKG Interpretation None       Radiology   Procedures Procedures (including critical care time)  Medications Ordered in ED Medications - No data to display   Initial Impression / Assessment and Plan / ED Course  I have reviewed the triage vital signs and the nursing notes.  Pertinent labs & imaging results that were  available during my care of the patient were reviewed by me and considered in my medical decision making (see chart for details).  MDM Reviewed: previous chart, nursing note and vitals Reviewed previous: CT scan Interpretation: x-ray   Results for orders placed or performed during the hospital encounter of 11/22/16  Urinalysis, Routine w reflex microscopic  Result Value Ref Range   Color, Urine YELLOW YELLOW   APPearance CLEAR CLEAR   Specific Gravity, Urine 1.017 1.005 - 1.030   pH 6.0 5.0 - 8.0   Glucose, UA NEGATIVE NEGATIVE mg/dL   Hgb urine dipstick SMALL (A) NEGATIVE   Bilirubin Urine NEGATIVE NEGATIVE   Ketones, ur 5 (A) NEGATIVE mg/dL   Protein, ur 30 (A) NEGATIVE mg/dL   Nitrite NEGATIVE NEGATIVE   Leukocytes, UA TRACE (A) NEGATIVE   RBC / HPF 6-30 0 - 5 RBC/hpf   WBC, UA 6-30 0 - 5 WBC/hpf   Bacteria, UA RARE (A) NONE SEEN   Squamous Epithelial /  LPF 0-5 (A) NONE SEEN   Mucous PRESENT    Hyaline Casts, UA PRESENT   POC occult blood, ED  Result Value Ref Range   Fecal Occult Bld NEGATIVE NEGATIVE   Dg Abd Acute W/chest Result Date: 11/22/2016 CLINICAL DATA:  Rectal pain beginning this morning. EXAM: DG ABDOMEN ACUTE W/ 1V CHEST COMPARISON:  CT abdomen and pelvis 06/03/2015. Single-view of the abdomen 06/25/2014. PA and lateral chest 06/24/2014. FINDINGS: Single-view of the chest demonstrates clear lungs and normal heart size. No pneumothorax or pleural effusion. Two views of the abdomen show no free intraperitoneal air. Moderately large stool burden throughout the colon is identified. Convex right lumbar scoliosis is unchanged. IMPRESSION: Moderately large stool burden throughout the colon. No acute abnormality chest or abdomen. Electronically Signed   By: Inge Rise M.D.   On: 11/22/2016 16:01    1515:  Soft large fecal impaction in rectal vault. I was able to soften stool enough that pt was able to pass another large bowel movement on her own. Stool is heme  negative.   1925:  Pt did not take her BP meds today; BP meds given with slow tred downwards of BP. Pt has tol PO well while in the ED without N/V. Family is here now and wants to take her home. Dx and testing d/w pt and family.  Questions answered.  Verb understanding, agreeable to d/c home with outpt f/u.   Final Clinical Impressions(s) / ED Diagnoses   Final diagnoses:  None    New Prescriptions New Prescriptions   No medications on file      Francine Graven, DO 11/25/16 2105

## 2016-11-22 NOTE — ED Triage Notes (Signed)
Patient c/o rectal pain that started this morning. Patient had large BM while in bathroom in triage.

## 2016-11-22 NOTE — Discharge Instructions (Signed)
Take over the counter stool softener (colace, miralax), as directed on packaging, for the next month.  Continue to take your usual prescriptions as previously directed.  Call your regular medical doctor on Monday to schedule a follow up appointment within the next 3 days.  Return to the Emergency Department immediately if worsening.

## 2016-11-24 LAB — URINE CULTURE: CULTURE: NO GROWTH

## 2017-01-05 ENCOUNTER — Other Ambulatory Visit: Payer: Self-pay | Admitting: Internal Medicine

## 2017-01-12 ENCOUNTER — Inpatient Hospital Stay (HOSPITAL_COMMUNITY)
Admission: EM | Admit: 2017-01-12 | Discharge: 2017-01-15 | DRG: 481 | Disposition: A | Discharge status: Skilled Nursing Facility | Payer: Medicare Other | Attending: Internal Medicine | Admitting: Internal Medicine

## 2017-01-12 ENCOUNTER — Emergency Department (HOSPITAL_COMMUNITY): Payer: Medicare Other

## 2017-01-12 ENCOUNTER — Encounter (HOSPITAL_COMMUNITY): Payer: Self-pay | Admitting: *Deleted

## 2017-01-12 DIAGNOSIS — R52 Pain, unspecified: Secondary | ICD-10-CM

## 2017-01-12 DIAGNOSIS — F0280 Dementia in other diseases classified elsewhere without behavioral disturbance: Secondary | ICD-10-CM | POA: Diagnosis present

## 2017-01-12 DIAGNOSIS — E7800 Pure hypercholesterolemia, unspecified: Secondary | ICD-10-CM | POA: Diagnosis present

## 2017-01-12 DIAGNOSIS — Z888 Allergy status to other drugs, medicaments and biological substances status: Secondary | ICD-10-CM

## 2017-01-12 DIAGNOSIS — W19XXXA Unspecified fall, initial encounter: Secondary | ICD-10-CM

## 2017-01-12 DIAGNOSIS — E039 Hypothyroidism, unspecified: Secondary | ICD-10-CM | POA: Diagnosis present

## 2017-01-12 DIAGNOSIS — K589 Irritable bowel syndrome without diarrhea: Secondary | ICD-10-CM | POA: Diagnosis present

## 2017-01-12 DIAGNOSIS — D500 Iron deficiency anemia secondary to blood loss (chronic): Secondary | ICD-10-CM | POA: Diagnosis present

## 2017-01-12 DIAGNOSIS — J441 Chronic obstructive pulmonary disease with (acute) exacerbation: Secondary | ICD-10-CM | POA: Diagnosis not present

## 2017-01-12 DIAGNOSIS — Z01818 Encounter for other preprocedural examination: Secondary | ICD-10-CM

## 2017-01-12 DIAGNOSIS — Z91010 Allergy to peanuts: Secondary | ICD-10-CM

## 2017-01-12 DIAGNOSIS — G309 Alzheimer's disease, unspecified: Secondary | ICD-10-CM | POA: Diagnosis present

## 2017-01-12 DIAGNOSIS — D62 Acute posthemorrhagic anemia: Secondary | ICD-10-CM | POA: Diagnosis not present

## 2017-01-12 DIAGNOSIS — Z7982 Long term (current) use of aspirin: Secondary | ICD-10-CM

## 2017-01-12 DIAGNOSIS — W07XXXA Fall from chair, initial encounter: Secondary | ICD-10-CM | POA: Diagnosis present

## 2017-01-12 DIAGNOSIS — K219 Gastro-esophageal reflux disease without esophagitis: Secondary | ICD-10-CM | POA: Diagnosis present

## 2017-01-12 DIAGNOSIS — Z8781 Personal history of (healed) traumatic fracture: Secondary | ICD-10-CM | POA: Diagnosis not present

## 2017-01-12 DIAGNOSIS — F0391 Unspecified dementia with behavioral disturbance: Secondary | ICD-10-CM | POA: Diagnosis present

## 2017-01-12 DIAGNOSIS — R64 Cachexia: Secondary | ICD-10-CM | POA: Diagnosis present

## 2017-01-12 DIAGNOSIS — I10 Essential (primary) hypertension: Secondary | ICD-10-CM | POA: Diagnosis present

## 2017-01-12 DIAGNOSIS — Z887 Allergy status to serum and vaccine status: Secondary | ICD-10-CM | POA: Diagnosis not present

## 2017-01-12 DIAGNOSIS — S72145A Nondisplaced intertrochanteric fracture of left femur, initial encounter for closed fracture: Secondary | ICD-10-CM | POA: Diagnosis present

## 2017-01-12 DIAGNOSIS — S72009A Fracture of unspecified part of neck of unspecified femur, initial encounter for closed fracture: Secondary | ICD-10-CM | POA: Diagnosis present

## 2017-01-12 DIAGNOSIS — S72002A Fracture of unspecified part of neck of left femur, initial encounter for closed fracture: Secondary | ICD-10-CM | POA: Diagnosis not present

## 2017-01-12 DIAGNOSIS — F0392 Unspecified dementia, unspecified severity, with psychotic disturbance: Secondary | ICD-10-CM | POA: Diagnosis present

## 2017-01-12 DIAGNOSIS — Z885 Allergy status to narcotic agent status: Secondary | ICD-10-CM | POA: Diagnosis not present

## 2017-01-12 DIAGNOSIS — I951 Orthostatic hypotension: Secondary | ICD-10-CM | POA: Diagnosis present

## 2017-01-12 DIAGNOSIS — Z9102 Food additives allergy status: Secondary | ICD-10-CM | POA: Diagnosis not present

## 2017-01-12 DIAGNOSIS — Z8673 Personal history of transient ischemic attack (TIA), and cerebral infarction without residual deficits: Secondary | ICD-10-CM

## 2017-01-12 DIAGNOSIS — Z419 Encounter for procedure for purposes other than remedying health state, unspecified: Secondary | ICD-10-CM | POA: Diagnosis not present

## 2017-01-12 DIAGNOSIS — G301 Alzheimer's disease with late onset: Secondary | ICD-10-CM | POA: Diagnosis not present

## 2017-01-12 DIAGNOSIS — Z881 Allergy status to other antibiotic agents status: Secondary | ICD-10-CM

## 2017-01-12 DIAGNOSIS — J449 Chronic obstructive pulmonary disease, unspecified: Secondary | ICD-10-CM | POA: Diagnosis present

## 2017-01-12 DIAGNOSIS — F419 Anxiety disorder, unspecified: Secondary | ICD-10-CM | POA: Diagnosis present

## 2017-01-12 DIAGNOSIS — S72415A Nondisplaced unspecified condyle fracture of lower end of left femur, initial encounter for closed fracture: Secondary | ICD-10-CM | POA: Diagnosis present

## 2017-01-12 LAB — CBC WITH DIFFERENTIAL/PLATELET
Basophils Absolute: 0 10*3/uL (ref 0.0–0.1)
Basophils Relative: 0 %
Eosinophils Absolute: 0 10*3/uL (ref 0.0–0.7)
Eosinophils Relative: 0 %
HCT: 33.9 % — ABNORMAL LOW (ref 36.0–46.0)
Hemoglobin: 11.4 g/dL — ABNORMAL LOW (ref 12.0–15.0)
Lymphocytes Relative: 8 %
Lymphs Abs: 0.6 10*3/uL — ABNORMAL LOW (ref 0.7–4.0)
MCH: 31.2 pg (ref 26.0–34.0)
MCHC: 33.6 g/dL (ref 30.0–36.0)
MCV: 92.9 fL (ref 78.0–100.0)
Monocytes Absolute: 0.3 10*3/uL (ref 0.1–1.0)
Monocytes Relative: 3 %
Neutro Abs: 7.1 10*3/uL (ref 1.7–7.7)
Neutrophils Relative %: 89 %
Platelets: 217 10*3/uL (ref 150–400)
RBC: 3.65 MIL/uL — ABNORMAL LOW (ref 3.87–5.11)
RDW: 14.1 % (ref 11.5–15.5)
WBC: 7.9 10*3/uL (ref 4.0–10.5)

## 2017-01-12 LAB — BASIC METABOLIC PANEL
Anion gap: 7 (ref 5–15)
BUN: 14 mg/dL (ref 6–20)
CO2: 27 mmol/L (ref 22–32)
Calcium: 9.4 mg/dL (ref 8.9–10.3)
Chloride: 109 mmol/L (ref 101–111)
Creatinine, Ser: 0.58 mg/dL (ref 0.44–1.00)
GFR calc Af Amer: >60 mL/min (ref >60)
GFR calc non Af Amer: >60 mL/min (ref >60)
Glucose, Bld: 108 mg/dL — ABNORMAL HIGH (ref 65–99)
Potassium: 4.2 mmol/L (ref 3.5–5.1)
Sodium: 143 mmol/L (ref 135–145)

## 2017-01-12 MED ORDER — FENTANYL CITRATE (PF) 100 MCG/2ML IJ SOLN
12.5000 ug | Freq: Once | INTRAMUSCULAR | Status: AC
  Administered 2017-01-12: 12.5 ug via INTRAVENOUS
  Filled 2017-01-12: qty 2

## 2017-01-12 MED ORDER — ALBUTEROL SULFATE (2.5 MG/3ML) 0.083% IN NEBU: 3.0000 mL | INHALATION_SOLUTION | Freq: Four times a day (QID) | RESPIRATORY_TRACT | Status: DC | PRN

## 2017-01-12 MED ORDER — METOPROLOL SUCCINATE ER 25 MG PO TB24
25.0000 mg | ORAL_TABLET | Freq: Every day | ORAL | Status: DC
  Administered 2017-01-13: 25 mg via ORAL
  Filled 2017-01-12 (×3): qty 1

## 2017-01-12 MED ORDER — MOMETASONE FURO-FORMOTEROL FUM 100-5 MCG/ACT IN AERO
2.0000 | INHALATION_SPRAY | Freq: Two times a day (BID) | RESPIRATORY_TRACT | Status: DC
Start: 2017-01-12 — End: 2017-01-15
  Administered 2017-01-13 – 2017-01-15 (×5): 2 via RESPIRATORY_TRACT
  Filled 2017-01-12 (×2): qty 8.8

## 2017-01-12 MED ORDER — FENTANYL CITRATE (PF) 100 MCG/2ML IJ SOLN
12.5000 ug | INTRAMUSCULAR | Status: DC | PRN
  Administered 2017-01-13 (×2): 12.5 ug via INTRAVENOUS
  Filled 2017-01-12 (×3): qty 2

## 2017-01-12 MED ORDER — ADULT MULTIVITAMIN W/MINERALS CH
1.0000 | ORAL_TABLET | Freq: Every day | ORAL | Status: DC
  Administered 2017-01-14 – 2017-01-15 (×2): 1 via ORAL
  Filled 2017-01-12 (×2): qty 1

## 2017-01-12 MED ORDER — LOSARTAN POTASSIUM 25 MG PO TABS
25.0000 mg | ORAL_TABLET | Freq: Every day | ORAL | Status: DC
  Filled 2017-01-12 (×2): qty 1

## 2017-01-12 MED ORDER — LORAZEPAM 0.5 MG PO TABS
0.5000 mg | ORAL_TABLET | Freq: Two times a day (BID) | ORAL | Status: DC | PRN
Start: 2017-01-12 — End: 2017-01-15

## 2017-01-12 MED ORDER — LEVOTHYROXINE SODIUM 50 MCG PO TABS
50.0000 ug | ORAL_TABLET | Freq: Every day | ORAL | Status: DC
  Administered 2017-01-14 – 2017-01-15 (×2): 50 ug via ORAL
  Filled 2017-01-12 (×2): qty 1

## 2017-01-12 NOTE — ED Notes (Signed)
Bed: WA17 Expected date:  Expected time:  Means of arrival:  Comments: 83 yo fall- hip pain

## 2017-01-12 NOTE — H&P (Signed)
History and Physical    Kathryn Whitaker YQM:[id number] DOB: 06/13/1920 DOA: 01/12/2017  PCP: Hoyt Koch, MD  Patient coming from: Skilled nursing facility.  Chief Complaint: Fall.  HPI: Kathryn Whitaker is a 83 y.o. female with history of hypertension, hypothyroidism, asthma and anemia was brought to the ER the patient had a fall. Patient states she was trying to stand up from a chair when she fell. She states she hit her head and may have lost consciousness. Patient denies any chest pain or shortness of breath or palpitations prior to the episode or after the episode.   ED Course: In the ER patient appears nonfocal and x-rays revealed a left hip fracture. Dr. Percell Miller of orthopedics has been consulted and patient is being admitted for further management. On my exam patient is not in distress.  Review of Systems: As per HPI, rest all negative.   Past Medical History:  Diagnosis Date  . Acute asthmatic bronchitis   . Allergic rhinitis   . Alzheimer's dementia   . Anxiety   . Chronic constipation   . Diverticulosis of colon   . DJD (degenerative joint disease)   . GERD (gastroesophageal reflux disease)   . History of colonic polyps   . Hypercholesteremia   . Hypertension   . IBS (irritable bowel syndrome)   . Lacunar infarction ([place] [facility] [place])   . Lumbar back pain   . Malnutrition ([place])   . UTI (lower urinary tract infection)   . Vitamin D deficiency     Past Surgical History:  Procedure Laterality Date  . ABDOMINAL HYSTERECTOMY       reports that she has never smoked. She has never used smokeless tobacco. She reports that she does not drink alcohol or use drugs.  Allergies  Allergen Reactions  . Demerol [Meperidine]     Pt states she can't remember type of reaction  . Influenza Vaccines     Pt can't remember reaction but states allergic and has not had in 25 years  . Morphine And Related Dermatitis  . Peanuts [Peanut Oil]     Pt reports she is allergic to peanuts,  but was asking to eat peanut butter. Pt unsure of her reaction.   [name] [Amoxicill-Clarithro-Lansopraz] Hives    .Marland KitchenHas patient had a PCN reaction causing immediate rash, facial/tongue/throat swelling, SOB or lightheadedness with hypotension: unknown Has patient had a PCN reaction causing severe rash involving mucus membranes or skin necrosis: unknown Has patient had a PCN reaction that required hospitalization unknown Has patient had a PCN reaction occurring within the last 10 years: unknown If all of the above answers are "NO", then may proceed with Cephalosporin use.   . Telithromycin Hives    REACTION: pt states HIVES....    Family History  Problem Relation Age of Onset  . Hypertension Other     Prior to Admission medications   Medication Sig Start Date End Date Taking? Authorizing Provider  acetaminophen (TYLENOL) 325 MG tablet Take 2 tablets (650 mg total) by mouth every 6 (six) hours as needed. Patient not taking: Reported on 11/22/2016 12/14/12   Rama, Venetia Maxon, MD  albuterol (PROVENTIL HFA) 108 (90 BASE) MCG/ACT inhaler Inhale 2 puffs into the lungs every 6 (six) hours as needed. For shortness of breath 06/27/14   Delfina Redwood, MD  aspirin 81 MG tablet Take 81 mg by mouth daily.    [provider]  budesonide-formoterol (SYMBICORT) 80-4.5 MCG/ACT inhaler Inhale 2 puffs into the lungs  2 (two) times daily as needed (wheezing).    [provider]  fluticasone (FLONASE) 50 MCG/ACT nasal spray Place 2 sprays into both nostrils daily. Patient not taking: Reported on 11/22/2016 01/23/16   Hoyt Koch, MD  levothyroxine (SYNTHROID, LEVOTHROID) 50 MCG tablet TAKE 1 TABLET (50 MCG TOTAL) BY MOUTH DAILY BEFORE BREAKFAST. 01/06/17   Golden Circle, FNP  LORazepam (ATIVAN) 0.5 MG tablet Take 1 tablet (0.5 mg total) by mouth 2 (two) times daily as needed for anxiety. 07/26/16   Hoyt Koch, MD  losartan (COZAAR) 25 MG tablet TAKE 1 TABLET (25 MG TOTAL)  BY MOUTH DAILY. 01/06/17   Golden Circle, FNP  metoprolol succinate (TOPROL-XL) 25 MG 24 hr tablet Take 25 mg by mouth daily.  09/18/16   [provider]  Multiple Vitamins-Minerals (MULTIVITAMIN WITH MINERALS) tablet Take 1 tablet by mouth daily.    [provider]  senna-docusate (SENOKOT-S) 8.6-50 MG tablet Take 1 tablet by mouth 2 (two) times daily. Patient not taking: Reported on 11/22/2016 10/23/15   Hoyt Koch, MD    Physical Exam: Vitals:   01/12/17 1755 01/12/17 1904 01/12/17 1953 01/12/17 2008  BP:  (!) 158/101  (!) 147/89  Pulse:  83  84  Resp:  18  15  SpO2: 96% 96%  97%  Weight:   43.1 kg (95 lb)   Height:   5\' 2"  (1.575 m)       Constitutional: Moderately built and nourished. Vitals:   01/12/17 1755 01/12/17 1904 01/12/17 1953 01/12/17 2008  BP:  (!) 158/101  (!) 147/89  Pulse:  83  84  Resp:  18  15  SpO2: 96% 96%  97%  Weight:   43.1 kg (95 lb)   Height:   5\' 2"  (1.575 m)    Eyes: Anicteric no pallor. ENMT: No discharge from the ears eyes nose and mouth. Neck: No neck rigidity no mass felt. Respiratory: No rhonchi or crepitations. Cardiovascular: S1 and S2 heard no murmurs appreciated. Abdomen: Soft nontender bowel sounds present. Musculoskeletal: Pain ongoing left hip. Skin: Chronic skin changes. Neurologic: Alert awake oriented to person and place. Moves all extremities. Psychiatric: Appears normal. Normal affect.   Labs on Admission: I have personally reviewed following labs and imaging studies  CBC:  Recent Labs Lab 01/12/17 1956  WBC 7.9  NEUTROABS 7.1  HGB 11.4*  HCT 33.9*  MCV 92.9  PLT 211   Basic Metabolic Panel:  Recent Labs Lab 01/12/17 1956  NA 143  K 4.2  CL 109  CO2 27  GLUCOSE 108*  BUN 14  CREATININE 0.58  CALCIUM 9.4   GFR: Estimated Creatinine Clearance: 28.6 mL/min (by C-G formula based on SCr of 0.58 mg/dL). Liver Function Tests: No results for input(s): AST, ALT, ALKPHOS, BILITOT,  PROT, ALBUMIN in the last 168 hours. No results for input(s): LIPASE, AMYLASE in the last 168 hours. No results for input(s): AMMONIA in the last 168 hours. Coagulation Profile: No results for input(s): INR, PROTIME in the last 168 hours. Cardiac Enzymes: No results for input(s): CKTOTAL, CKMB, CKMBINDEX, TROPONINI in the last 168 hours. BNP (last 3 results) No results for input(s): PROBNP in the last 8760 hours. HbA1C: No results for input(s): HGBA1C in the last 72 hours. CBG: No results for input(s): GLUCAP in the last 168 hours. Lipid Profile: No results for input(s): CHOL, HDL, LDLCALC, TRIG, CHOLHDL, LDLDIRECT in the last 72 hours. Thyroid Function Tests: No results for input(s): TSH, T4TOTAL,  FREET4, T3FREE, THYROIDAB in the last 72 hours. Anemia Panel: No results for input(s): VITAMINB12, FOLATE, FERRITIN, TIBC, IRON, RETICCTPCT in the last 72 hours. Urine analysis:    Component Value Date/Time   COLORURINE YELLOW 11/22/2016 1517   APPEARANCEUR CLEAR 11/22/2016 1517   LABSPEC 1.017 11/22/2016 1517   PHURINE 6.0 11/22/2016 1517   GLUCOSEU NEGATIVE 11/22/2016 1517   GLUCOSEU NEGATIVE 06/03/2013 1035   HGBUR SMALL (A) 11/22/2016 1517   BILIRUBINUR NEGATIVE 11/22/2016 1517   BILIRUBINUR 1+ 07/26/2016 1027   KETONESUR 5 (A) 11/22/2016 1517   PROTEINUR 30 (A) 11/22/2016 1517   UROBILINOGEN 1.0 07/26/2016 1027   UROBILINOGEN 0.2 03/13/2015 1255   NITRITE NEGATIVE 11/22/2016 1517   LEUKOCYTESUR TRACE (A) 11/22/2016 1517   Sepsis Labs: @LABRCNTIP (procalcitonin:4,lacticidven:4) )No results found for this or any previous visit (from the past 240 hour(s)).   Radiological Exams on Admission: Dg Pelvis 1-2 Views  Result Date: 01/12/2017 CLINICAL DATA:  Fall, left hip pain EXAM: PELVIS - 1-2 VIEW COMPARISON:  None. FINDINGS: Visualized bony pelvis appears intact. Mild narrowing of the bilateral hip joint spaces, symmetric. Intertrochanteric left hip fracture. IMPRESSION:  Intertrochanteric left hip fracture, better evaluated on dedicated femur radiographs. Electronically Signed   By: Julian Hy M.D.   On: 01/12/2017 18:46   Dg Elbow Complete Left (3+view)  Result Date: 01/12/2017 CLINICAL DATA:  Slight pain in elbow after fall. EXAM: LEFT ELBOW - COMPLETE 3+ VIEW COMPARISON:  None. FINDINGS: Osteopenia.  No joint effusion or fracture noted. IMPRESSION: Negative. Electronically Signed   By: Dorise Bullion III M.D   On: 01/12/2017 18:45   Dg Femur Min 2 Views Left  Result Date: 01/12/2017 CLINICAL DATA:  Pain after fall EXAM: LEFT FEMUR 2 VIEWS COMPARISON:  None. FINDINGS: There is an intertrochanteric fracture best seen on AP and lateral views. No dislocation. No other femoral fracture. IMPRESSION: Intertrochanteric hip fracture. Electronically Signed   By: Dorise Bullion III M.D   On: 01/12/2017 18:48    EKG: Independently reviewed. Normal sinus rhythm with RBBB.  Assessment/Plan Principal Problem:   Hip fracture, left (HCC) Active Problems:   HTN (hypertension)   COPD (chronic obstructive pulmonary disease) (HCC)   Hypothyroidism   Dementia   Hip fracture ([place])    1. Left hip fracture - patient is at moderate risk for intermediate risk procedure. Patient will be transferred to [facility] and patient is agreeable to transfer. Dr. Noberto Retort will be the accepting physician. Since patient had a fall and hit her head will get CT head also. Patient will be kept nothing by mouth after midnight in anticipation of surgery. Pain relief medication. 2. Hypertension on Cozaar and metoprolol which will be continued. 3. Hypothyroidism on Synthroid. 4. Dementia and no medications. 5. History of COPD not actively wheezing continue with inhalers. 6. Normocytic normochromic anemia appears to be chronic. Follow CBC.  I have reviewed patient's old charts and labs.   DVT prophylaxis: SCDs. Code Status: Full code as confirmed with patient.  Family  Communication: Discussed with patient.  Disposition Plan: Skilled nursing facility.  Consults called: Orthopedic surgery.  Admission status: Inpatient.    Rise Patience MD [facility] Pager [phone].  If 7PM-7AM, please contact night-coverage [email or web address] Password [email or web address]  01/12/2017, 9:30 PM

## 2017-01-12 NOTE — ED Triage Notes (Signed)
EMS reports family heard pt fall, started to pick her up and she could not stand the pain. Left hip outer rotation, no shortening.

## 2017-01-12 NOTE — ED Provider Notes (Signed)
[facility] DEPT Provider Note   CSN: [id number] Arrival date & time: 01/12/17  1750     History   Chief Complaint Chief Complaint  Patient presents with  . Fall    HPI Kathryn Whitaker is a 83 y.o. female.  HPI   83 year old female presents today with status post fall.  Patient has a history dementia and majority information was obtained from her grandson who is at bedside.  He reports dementia has been worsening, over the last several days patient has not had very much sleep and has been up and walking.  They do not want her walking around the house that she is not very stable on her feet but found her down after hearing her fall.  She was complaining of left hip pain after the fall.  They note her mental status is at baseline, and notes no other changes other than the pain.  Patient's power of attorney is her son who is living in [place], he was on the phone for discussion with family.  Patient denies any preceding symptoms including chest pain dizziness, shortness of breath.    Past Medical History:  Diagnosis Date  . Acute asthmatic bronchitis   . Allergic rhinitis   . Alzheimer's dementia   . Anxiety   . Chronic constipation   . Diverticulosis of colon   . DJD (degenerative joint disease)   . GERD (gastroesophageal reflux disease)   . History of colonic polyps   . Hypercholesteremia   . Hypertension   . IBS (irritable bowel syndrome)   . Lacunar infarction ([place])   . Lumbar back pain   . Malnutrition ([place])   . UTI (lower urinary tract infection)   . Vitamin D deficiency     Patient Active Problem List   Diagnosis Date Noted  . Hip fracture, left ([place]) 01/12/2017  . Hip fracture ([place]) 01/12/2017  . Dementia 07/26/2016  . Dysuria 08/10/2015  . Hypothyroidism 06/06/2015  . Protein-calorie malnutrition, severe ([place]) 06/26/2014  . Constipation 12/17/2013  . COPD (chronic obstructive pulmonary disease) ([place]) 06/03/2013  . IBS (irritable bowel syndrome) 12/30/2012   . Generalized anxiety disorder 12/30/2012  . Dyslipidemia 12/12/2012  . HTN (hypertension) 12/12/2012  . Hx of completed stroke 07/11/2009    Past Surgical History:  Procedure Laterality Date  . ABDOMINAL HYSTERECTOMY      OB History    No data available       Home Medications    Prior to Admission medications   Medication Sig Start Date End Date Taking? Authorizing Provider  albuterol (PROVENTIL HFA) 108 (90 BASE) MCG/ACT inhaler Inhale 2 puffs into the lungs every 6 (six) hours as needed. For shortness of breath 06/27/14  Yes Delfina Redwood, MD  aspirin 81 MG tablet Take 81 mg by mouth daily.   Yes [provider]  budesonide-formoterol (SYMBICORT) 80-4.5 MCG/ACT inhaler Inhale 2 puffs into the lungs 2 (two) times daily as needed (wheezing).   Yes [provider]  levothyroxine (SYNTHROID, LEVOTHROID) 50 MCG tablet TAKE 1 TABLET (50 MCG TOTAL) BY MOUTH DAILY BEFORE BREAKFAST. 01/06/17  Yes Golden Circle, FNP  LORazepam (ATIVAN) 0.5 MG tablet Take 1 tablet (0.5 mg total) by mouth 2 (two) times daily as needed for anxiety. 07/26/16  Yes Hoyt Koch, MD  losartan (COZAAR) 25 MG tablet TAKE 1 TABLET (25 MG TOTAL) BY MOUTH DAILY. 01/06/17  Yes Golden Circle, FNP  metoprolol succinate (TOPROL-XL) 25 MG 24 hr tablet Take 25 mg  by mouth daily.  09/18/16  Yes [provider]  Multiple Vitamins-Minerals (MULTIVITAMIN WITH MINERALS) tablet Take 1 tablet by mouth daily.   Yes [provider]    Family History Family History  Problem Relation Age of Onset  . Hypertension Other     Social History Social History  Substance Use Topics  . Smoking status: Never Smoker  . Smokeless tobacco: Never Used  . Alcohol use No     Allergies   Demerol [meperidine]; Influenza vaccines; Morphine and related; Peanuts [peanut oil]; Prevpac [amoxicill-clarithro-lansopraz]; and Telithromycin   Review of Systems Review of Systems  Unable to  perform ROS: Dementia    Physical Exam Updated Vital Signs BP (!) 147/89 (BP Location: Left Arm)   Pulse 84   Resp 15   Ht 5\' 2"  (1.575 m)   Wt 43.1 kg (95 lb)   SpO2 97%   BMI 17.38 kg/m   Physical Exam  Constitutional: She is oriented to person, place, and time. She appears well-developed and well-nourished.  HENT:  Head: Normocephalic and atraumatic.  Eyes: Pupils are equal, round, and reactive to light. Conjunctivae are normal. Right eye exhibits no discharge. Left eye exhibits no discharge. No scleral icterus.  Neck: Normal range of motion. No JVD present. No tracheal deviation present.  Pulmonary/Chest: Effort normal. No stridor.  Musculoskeletal:  Tenderness palpation of the left anterior hip and proximal femur no gross deformity noted.  Remainder of lower extremity atraumatic nontender hips are stable with both AP and lateral compression.  No CT or L-spine tenderness to palpation, chest wall nontender to palpation bilateral upper extremities nontender full active range of motion.  Distal perfusion intact to the affected extremity  Neurological: She is alert and oriented to person, place, and time. Coordination normal.  Psychiatric: She has a normal mood and affect. Her behavior is normal. Judgment and thought content normal.  Nursing note and vitals reviewed.    ED Treatments / Results  Labs (all labs ordered are listed, but only abnormal results are displayed) Labs Reviewed  CBC WITH DIFFERENTIAL/PLATELET - Abnormal; Notable for the following:       Result Value   RBC 3.65 (*)    Hemoglobin 11.4 (*)    HCT 33.9 (*)    Lymphs Abs 0.6 (*)    All other components within normal limits  BASIC METABOLIC PANEL - Abnormal; Notable for the following:    Glucose, Bld 108 (*)    All other components within normal limits  URINALYSIS, ROUTINE W REFLEX MICROSCOPIC    EKG  EKG Interpretation  Date/Time:  Sunday January 12 2017 18:01:23 EDT Ventricular Rate:  87 PR  Interval:    QRS Duration: 105 QT Interval:  403 QTC Calculation: 485 R Axis:   -67 Text Interpretation:  Sinus rhythm Incomplete RBBB and LAFB Abnormal R-wave progression, early transition Confirmed by Nat Christen [phone]) on 01/12/2017 7:29:00 PM       Radiology Dg Pelvis 1-2 Views  Result Date: 01/12/2017 CLINICAL DATA:  Fall, left hip pain EXAM: PELVIS - 1-2 VIEW COMPARISON:  None. FINDINGS: Visualized bony pelvis appears intact. Mild narrowing of the bilateral hip joint spaces, symmetric. Intertrochanteric left hip fracture. IMPRESSION: Intertrochanteric left hip fracture, better evaluated on dedicated femur radiographs. Electronically Signed   By: Julian Hy M.D.   On: 01/12/2017 18:46   Dg Elbow Complete Left (3+view)  Result Date: 01/12/2017 CLINICAL DATA:  Slight pain in elbow after fall. EXAM: LEFT ELBOW - COMPLETE 3+ VIEW COMPARISON:  None. FINDINGS: Osteopenia.  No joint effusion or fracture noted. IMPRESSION: Negative. Electronically Signed   By: Dorise Bullion III M.D   On: 01/12/2017 18:45   Dg Chest Port 1 View  Result Date: 01/12/2017 CLINICAL DATA:  Persistent left-sided pain after falling earlier. EXAM: PORTABLE CHEST 1 VIEW COMPARISON:  11/22/2016 FINDINGS: A single AP portable view of the chest demonstrates no focal airspace consolidation or alveolar edema. The lungs are grossly clear. There is no large effusion or pneumothorax. Cardiac and mediastinal contours appear unremarkable. IMPRESSION: No acute findings Electronically Signed   By: Andreas Newport M.D.   On: 01/12/2017 21:40   Dg Femur Min 2 Views Left  Result Date: 01/12/2017 CLINICAL DATA:  Pain after fall EXAM: LEFT FEMUR 2 VIEWS COMPARISON:  None. FINDINGS: There is an intertrochanteric fracture best seen on AP and lateral views. No dislocation. No other femoral fracture. IMPRESSION: Intertrochanteric hip fracture. Electronically Signed   By: Dorise Bullion III M.D   On: 01/12/2017 18:48     Procedures Procedures (including critical care time)  Medications Ordered in ED Medications  levothyroxine (SYNTHROID, LEVOTHROID) tablet 50 mcg (not administered)  losartan (COZAAR) tablet 25 mg (not administered)  metoprolol succinate (TOPROL-XL) 24 hr tablet 25 mg (not administered)  LORazepam (ATIVAN) tablet 0.5 mg (not administered)  mometasone-formoterol (DULERA) 100-5 MCG/ACT inhaler 2 puff (not administered)  multivitamin with minerals tablet 1 tablet (not administered)  albuterol (PROVENTIL HFA;VENTOLIN HFA) 108 (90 Base) MCG/ACT inhaler 2 puff (not administered)  fentaNYL (SUBLIMAZE) injection 12.5 mcg (not administered)  fentaNYL (SUBLIMAZE) injection 12.5 mcg (12.5 mcg Intravenous Given 01/12/17 2006)     Initial Impression / Assessment and Plan / ED Course  I have reviewed the triage vital signs and the nursing notes.  Pertinent labs & imaging results that were available during my care of the patient were reviewed by me and considered in my medical decision making (see chart for details).      Final Clinical Impressions(s) / ED Diagnoses   Final diagnoses:  Closed nondisplaced intertrochanteric fracture of left femur, initial encounter ([place])   83 year old female presents status post fall.  She has an intertrochanteric hip fracture.  Discussed care with patient's power of attorney and grandson are at bedside.  They would like to discuss case with orthopedic surgeon.  I spoke with Dr. Percell Miller on the phone, he recommends hospital admission for potential surgery tomorrow.  N.p.o. after midnight.  [facility] service consulted for hospital admission.  New Prescriptions New Prescriptions   No medications on file     Francee Gentile 01/12/17 2239    Nat Christen, MD 01/15/17 [phone]

## 2017-01-13 ENCOUNTER — Encounter (HOSPITAL_COMMUNITY)
Admission: EM | Disposition: A | Discharge status: Skilled Nursing Facility | Payer: Self-pay | Source: Home / Self Care | Attending: Internal Medicine

## 2017-01-13 ENCOUNTER — Inpatient Hospital Stay (HOSPITAL_COMMUNITY): Payer: Medicare Other

## 2017-01-13 ENCOUNTER — Inpatient Hospital Stay (HOSPITAL_COMMUNITY): Payer: Medicare Other | Admitting: Certified Registered"

## 2017-01-13 DIAGNOSIS — Z419 Encounter for procedure for purposes other than remedying health state, unspecified: Secondary | ICD-10-CM

## 2017-01-13 DIAGNOSIS — Z8781 Personal history of (healed) traumatic fracture: Secondary | ICD-10-CM

## 2017-01-13 DIAGNOSIS — E039 Hypothyroidism, unspecified: Secondary | ICD-10-CM

## 2017-01-13 DIAGNOSIS — J441 Chronic obstructive pulmonary disease with (acute) exacerbation: Secondary | ICD-10-CM

## 2017-01-13 HISTORY — PX: FEMUR IM NAIL: SHX1597

## 2017-01-13 LAB — GLUCOSE, CAPILLARY
GLUCOSE-CAPILLARY: 72 mg/dL (ref 65–99)
GLUCOSE-CAPILLARY: 89 mg/dL (ref 65–99)
Glucose-Capillary: 88 mg/dL (ref 65–99)

## 2017-01-13 LAB — ABO/RH: ABO/RH(D): O POS

## 2017-01-13 LAB — TYPE AND SCREEN
ABO/RH(D): O POS
ANTIBODY SCREEN: NEGATIVE

## 2017-01-13 LAB — MRSA PCR SCREENING: MRSA by PCR: POSITIVE — AB

## 2017-01-13 SURGERY — INSERTION, INTRAMEDULLARY ROD, FEMUR
Anesthesia: General | Site: Hip | Laterality: Left

## 2017-01-13 MED ORDER — BOOST / RESOURCE BREEZE PO LIQD
1.0000 | Freq: Three times a day (TID) | ORAL | Status: DC
  Administered 2017-01-14 – 2017-01-15 (×2): 1 via ORAL

## 2017-01-13 MED ORDER — LACTATED RINGERS IV SOLN
INTRAVENOUS | Status: DC | PRN
  Administered 2017-01-13: 15:00:00 via INTRAVENOUS

## 2017-01-13 MED ORDER — ROCURONIUM BROMIDE 10 MG/ML (PF) SYRINGE
PREFILLED_SYRINGE | INTRAVENOUS | Status: DC | PRN
  Administered 2017-01-13: 40 mg via INTRAVENOUS

## 2017-01-13 MED ORDER — LACTATED RINGERS IV SOLN
INTRAVENOUS | Status: DC
  Administered 2017-01-13: 14:00:00 via INTRAVENOUS

## 2017-01-13 MED ORDER — POVIDONE-IODINE 10 % EX SWAB: 2.0000 "application " | Freq: Once | CUTANEOUS | Status: DC

## 2017-01-13 MED ORDER — PROPOFOL 10 MG/ML IV BOLUS
INTRAVENOUS | Status: DC | PRN
  Administered 2017-01-13: 20 mg via INTRAVENOUS
  Administered 2017-01-13: 40 mg via INTRAVENOUS

## 2017-01-13 MED ORDER — ACETAMINOPHEN 325 MG PO TABS
650.0000 mg | ORAL_TABLET | Freq: Four times a day (QID) | ORAL | Status: DC | PRN
  Administered 2017-01-13 – 2017-01-15 (×2): 650 mg via ORAL
  Filled 2017-01-13 (×3): qty 2

## 2017-01-13 MED ORDER — ACETAMINOPHEN 650 MG RE SUPP: 650.0000 mg | Freq: Four times a day (QID) | RECTAL | Status: DC | PRN

## 2017-01-13 MED ORDER — FENTANYL CITRATE (PF) 100 MCG/2ML IJ SOLN: 25.0000 ug | INTRAMUSCULAR | Status: DC | PRN

## 2017-01-13 MED ORDER — LABETALOL HCL 5 MG/ML IV SOLN
10.0000 mg | INTRAVENOUS | Status: DC | PRN
  Filled 2017-01-13: qty 4

## 2017-01-13 MED ORDER — CHLORHEXIDINE GLUCONATE CLOTH 2 % EX PADS
6.0000 | MEDICATED_PAD | Freq: Every day | CUTANEOUS | Status: DC
  Administered 2017-01-13 – 2017-01-14 (×2): 6 via TOPICAL

## 2017-01-13 MED ORDER — FENTANYL CITRATE (PF) 250 MCG/5ML IJ SOLN
INTRAMUSCULAR | Status: DC | PRN
  Administered 2017-01-13 (×2): 50 ug via INTRAVENOUS

## 2017-01-13 MED ORDER — EPHEDRINE SULFATE-NACL 50-0.9 MG/10ML-% IV SOSY
PREFILLED_SYRINGE | INTRAVENOUS | Status: DC | PRN
  Administered 2017-01-13: 5 mg via INTRAVENOUS
  Administered 2017-01-13 (×2): 10 mg via INTRAVENOUS

## 2017-01-13 MED ORDER — CLINDAMYCIN PHOSPHATE 900 MG/50ML IV SOLN
900.0000 mg | INTRAVENOUS | Status: AC
  Administered 2017-01-13: 900 mg via INTRAVENOUS
  Filled 2017-01-13 (×2): qty 50

## 2017-01-13 MED ORDER — CHLORHEXIDINE GLUCONATE 4 % EX LIQD: 60.0000 mL | Freq: Once | CUTANEOUS | Status: DC

## 2017-01-13 MED ORDER — LACTATED RINGERS IV SOLN
INTRAVENOUS | Status: DC
  Administered 2017-01-13 – 2017-01-15 (×3): via INTRAVENOUS

## 2017-01-13 MED ORDER — POLYETHYLENE GLYCOL 3350 17 G PO PACK: 17.0000 g | PACK | Freq: Every day | ORAL | Status: DC | PRN

## 2017-01-13 MED ORDER — ASPIRIN EC 325 MG PO TBEC
325.0000 mg | DELAYED_RELEASE_TABLET | Freq: Every day | ORAL | Status: DC
  Administered 2017-01-14 – 2017-01-15 (×2): 325 mg via ORAL
  Filled 2017-01-13 (×2): qty 1

## 2017-01-13 MED ORDER — LIDOCAINE HCL (CARDIAC) 20 MG/ML IV SOLN
INTRAVENOUS | Status: AC
  Filled 2017-01-13: qty 5

## 2017-01-13 MED ORDER — FENTANYL CITRATE (PF) 250 MCG/5ML IJ SOLN
INTRAMUSCULAR | Status: AC
  Filled 2017-01-13: qty 5

## 2017-01-13 MED ORDER — SUGAMMADEX SODIUM 200 MG/2ML IV SOLN
INTRAVENOUS | Status: DC | PRN
  Administered 2017-01-13: 200 mg via INTRAVENOUS

## 2017-01-13 MED ORDER — DEXAMETHASONE SODIUM PHOSPHATE 10 MG/ML IJ SOLN
INTRAMUSCULAR | Status: DC | PRN
  Administered 2017-01-13: 5 mg via INTRAVENOUS

## 2017-01-13 MED ORDER — LIDOCAINE 2% (20 MG/ML) 5 ML SYRINGE
INTRAMUSCULAR | Status: DC | PRN
  Administered 2017-01-13: 60 mg via INTRAVENOUS

## 2017-01-13 MED ORDER — DOCUSATE SODIUM 100 MG PO CAPS
100.0000 mg | ORAL_CAPSULE | Freq: Two times a day (BID) | ORAL | Status: DC
  Administered 2017-01-14 – 2017-01-15 (×3): 100 mg via ORAL
  Filled 2017-01-13 (×4): qty 1

## 2017-01-13 MED ORDER — PHENYLEPHRINE 40 MCG/ML (10ML) SYRINGE FOR IV PUSH (FOR BLOOD PRESSURE SUPPORT)
PREFILLED_SYRINGE | INTRAVENOUS | Status: DC | PRN
  Administered 2017-01-13 (×3): 80 ug via INTRAVENOUS

## 2017-01-13 MED ORDER — MUPIROCIN 2 % EX OINT
1.0000 "application " | TOPICAL_OINTMENT | Freq: Two times a day (BID) | CUTANEOUS | Status: DC
  Administered 2017-01-14 – 2017-01-15 (×2): 1 via NASAL
  Filled 2017-01-13: qty 22

## 2017-01-13 MED ORDER — PROPOFOL 10 MG/ML IV BOLUS
INTRAVENOUS | Status: AC
  Filled 2017-01-13: qty 20

## 2017-01-13 MED ORDER — ONDANSETRON HCL 4 MG/2ML IJ SOLN
INTRAMUSCULAR | Status: DC | PRN
  Administered 2017-01-13: 4 mg via INTRAVENOUS

## 2017-01-13 SURGICAL SUPPLY — 36 items
CANISTER SUCT 3000ML PPV (MISCELLANEOUS) ×2 IMPLANT
CLOSURE STERI-STRIP 1/2X4 (GAUZE/BANDAGES/DRESSINGS) ×1
CLSR STERI-STRIP ANTIMIC 1/2X4 (GAUZE/BANDAGES/DRESSINGS) ×2 IMPLANT
COVER PERINEAL POST (MISCELLANEOUS) ×3 IMPLANT
COVER SURGICAL LIGHT HANDLE (MISCELLANEOUS) ×3 IMPLANT
DRAPE INCISE IOBAN 66X45 STRL (DRAPES) ×2 IMPLANT
DRAPE STERI IOBAN 125X83 (DRAPES) ×3 IMPLANT
DRESSING ALLEVYN LIFE SACRUM (GAUZE/BANDAGES/DRESSINGS) ×2 IMPLANT
DRSG MEPILEX BORDER 4X4 (GAUZE/BANDAGES/DRESSINGS) ×6 IMPLANT
DURAPREP 26ML APPLICATOR (WOUND CARE) ×3 IMPLANT
ELECT REM PT RETURN 9FT ADLT (ELECTROSURGICAL) ×3
ELECTRODE REM PT RTRN 9FT ADLT (ELECTROSURGICAL) ×1 IMPLANT
GLOVE BIO SURGEON STRL SZ7.5 (GLOVE) ×6 IMPLANT
GLOVE BIOGEL PI IND STRL 8 (GLOVE) ×2 IMPLANT
GLOVE BIOGEL PI INDICATOR 8 (GLOVE) ×4
GOWN STRL REUS W/ TWL LRG LVL3 (GOWN DISPOSABLE) ×3 IMPLANT
GOWN STRL REUS W/TWL LRG LVL3 (GOWN DISPOSABLE) ×9
GUIDEROD T2 3X1000 (ROD) ×2 IMPLANT
K-WIRE  3.2X450M STR (WIRE) ×4
K-WIRE 3.2X450M STR (WIRE) ×2
KIT BASIN OR (CUSTOM PROCEDURE TRAY) ×3 IMPLANT
KIT NAIL LONG 10X360MMX125 (Nail) ×2 IMPLANT
KIT ROOM TURNOVER OR (KITS) ×3 IMPLANT
KWIRE 3.2X450M STR (WIRE) IMPLANT
MANIFOLD NEPTUNE II (INSTRUMENTS) ×1 IMPLANT
NS IRRIG 1000ML POUR BTL (IV SOLUTION) ×3 IMPLANT
PACK GENERAL/GYN (CUSTOM PROCEDURE TRAY) ×3 IMPLANT
PAD ARMBOARD 7.5X6 YLW CONV (MISCELLANEOUS) ×6 IMPLANT
SCREW LAG GAMMA 3 TI 10.5X90MM (Screw) ×2 IMPLANT
SUT MNCRL AB 4-0 PS2 18 (SUTURE) IMPLANT
SUT MON AB 2-0 CT1 36 (SUTURE) IMPLANT
SUT VIC AB 0 CT1 27 (SUTURE) ×3
SUT VIC AB 0 CT1 27XBRD ANBCTR (SUTURE) ×1 IMPLANT
TOWEL OR 17X24 6PK STRL BLUE (TOWEL DISPOSABLE) ×3 IMPLANT
TOWEL OR 17X26 10 PK STRL BLUE (TOWEL DISPOSABLE) ×3 IMPLANT
WATER STERILE IRR 1000ML POUR (IV SOLUTION) ×3 IMPLANT

## 2017-01-13 NOTE — Transfer of Care (Signed)
Immediate Anesthesia Transfer of Care Note  Patient: Kathryn Whitaker  Procedure(s) Performed: Procedure(s): TROCHANTERIC INTRAMEDULLARY (IM) NAIL FEMORAL (Left)  Patient Location: PACU  Anesthesia Type:General  Level of Consciousness: awake, alert  and oriented  Airway & Oxygen Therapy: Patient Spontanous Breathing and Patient connected to face mask oxygen  Post-op Assessment: Report given to RN and Post -op Vital signs reviewed and stable  Post vital signs: Reviewed and stable  Last Vitals:  Vitals:   01/13/17 0828 01/13/17 0904  BP: (!) 171/94 (!) 171/94  Pulse:    Resp:    Temp:      Last Pain:  Vitals:   01/13/17 1100  TempSrc:   PainSc: 3          Complications: No apparent anesthesia complications

## 2017-01-13 NOTE — Social Work (Addendum)
CSW received phone call from nurse from surgical preop with concerns about patient at home. CSW validated and listened to concerns about patient care at home prior to hospitalization.  Nurse indicated that patient wants to go to nursing facility. CSW indicate that patient will more than likely have that need for short term rehab and family can work on the long term plan for facility placement. CSW discussed the role of community agency if needed in care. If son is POA, he would need to provide paperwork to hospital and have that scanned into medical record.  CSW will follow case closely and discuss options with family based on clinical teams recommendations and having a safe dc plan.  CSW reviewed nurses progress note below.  Elissa Hefty, LCSW Clinical Social Worker [phone]

## 2017-01-13 NOTE — Op Note (Signed)
DATE OF SURGERY:  01/13/2017  TIME: 3:25 PM  PATIENT NAME:  Kathryn Whitaker  AGE: 83 y.o.  PRE-OPERATIVE DIAGNOSIS:  Fractured Left Hip  POST-OPERATIVE DIAGNOSIS:  SAME  PROCEDURE:  TROCHANTERIC INTRAMEDULLARY (IM) NAIL FEMORAL  SURGEON:  Evon Dejarnett D  ASSISTANT:  none  OPERATIVE IMPLANTS: Stryker Gamma Nail  PREOPERATIVE INDICATIONS:  Kathryn Whitaker is a 83 y.o. year old who fell and suffered a hip fracture. She was brought into the ER and then admitted and optimized and then elected for surgical intervention.    The risks benefits and alternatives were discussed with the patient including but not limited to the risks of nonoperative treatment, versus surgical intervention including infection, bleeding, nerve injury, malunion, nonunion, hardware prominence, hardware failure, need for hardware removal, blood clots, cardiopulmonary complications, morbidity, mortality, among others, and they were willing to proceed.    OPERATIVE PROCEDURE:  The patient was brought to the operating room and placed in the supine position. General anesthesia was administered. She was placed on the fracture table.  Closed reduction was performed under C-arm guidance. Time out was then performed after sterile prep and drape. She received preoperative antibiotics.  Incision was made proximal to the greater trochanter. A guidewire was placed in the appropriate position. Confirmation was made on AP and lateral views. The above-named nail was opened. I opened the proximal femur with a reamer. I then placed the nail by hand easily down. I did not need to ream the femur.  Once the nail was completely seated, I placed a guidepin into the femoral head into the center center position. I measured the length, and then reamed the lateral cortex and up into the head. I then placed the lag screw. Slight compression was applied. Anatomic fixation achieved. Bone quality was mediocre.  I then secured the proximal  interlocking bolt, and took off a half a turn, and then removed the instruments, and took final C-arm pictures AP and lateral the entire length of the leg.   Anatomic reconstruction was achieved, and the wounds were irrigated copiously and closed with Vicryl followed by staples and sterile gauze for the skin. The patient was awakened and returned to PACU in stable and satisfactory condition. There no complications and the patient tolerated the procedure well.  She will be weightbearing as tolerated, and will be on chemical px  for a period of four weeks after discharge.   Edmonia Lynch, M.D.

## 2017-01-13 NOTE — H&P (View-Only) (Signed)
ORTHOPAEDIC CONSULTATION  REQUESTING PHYSICIAN: Hosie Poisson, MD  Chief Complaint: Left hip pain. Fall onto left side from chair.  Assessment / plan: Principal Problem:   Hip fracture, left (HCC) Active Problems:   HTN (hypertension)   COPD (chronic obstructive pulmonary disease) (HCC)   Hypothyroidism   Dementia   Hip fracture (HCC)   S/p left hip fracture  Closed nondisplaced left femur intertrochanteric fracture Plan for possible Operative fixation this afternoon -No family at bedside this morning. Dr. Alain Marion will reattempt contact with family to discuss surgical versus nonsurgical options. -NPO  -Medicine team to admit and perform pre-op clearance -Type and Cross for 2 units held for OR -PT/OT post op -Will ammend WB status postop, bedrest for now -Foley okay for comfort to be removed POD 1/2 -Likely to require Rehab or SNF placement upon discharge.  -VTE prophylaxis: SCDs  HPI: Kathryn Whitaker is a 83 y.o. female who complains of left hip pain after fall out of a chair after she dosed off. She presented to the emergency department where x-rays showed a left hip intertrochanteric fracture. Orthopedics was consulted for evaluation.  She has significant pain in her hip this morning and requests pain medication. She denies numbness in her lower extremity.  She denies chest pain or shortness of breath.  The patient was previously ambulatory but with reported increasing dementia. She does value mobility in the prospect of bearing weight on the leg. However, she would like to discuss with her family about surgical versus nonsurgical options.  Past Medical History:  Diagnosis Date  . Acute asthmatic bronchitis   . Allergic rhinitis   . Alzheimer's dementia   . Anxiety   . Chronic constipation   . Diverticulosis of colon   . DJD (degenerative joint disease)   . GERD (gastroesophageal reflux disease)   . History of colonic polyps   . Hypercholesteremia   .  Hypertension   . IBS (irritable bowel syndrome)   . Lacunar infarction ([place])   . Lumbar back pain   . Malnutrition ([place])   . UTI (lower urinary tract infection)   . Vitamin D deficiency    Past Surgical History:  Procedure Laterality Date  . ABDOMINAL HYSTERECTOMY     Social History   Social History  . Marital status: Widowed    Spouse name: N/A  . Number of children: 6  . Years of education: N/A   Social History Main Topics  . Smoking status: Never Smoker  . Smokeless tobacco: Never Used  . Alcohol use No  . Drug use: No  . Sexual activity: Not Asked   Other Topics Concern  . None   Social History Narrative  . None   Family History  Problem Relation Age of Onset  . Hypertension Other    Allergies  Allergen Reactions  . Demerol [Meperidine]     Pt states she can't remember type of reaction  . Influenza Vaccines     Pt can't remember reaction but states allergic and has not had in 25 years  . Morphine And Related Dermatitis  . Peanuts [Peanut Oil]     Pt reports she is allergic to peanuts, but was asking to eat peanut butter. Pt unsure of her reaction.   [name] [Amoxicill-Clarithro-Lansopraz] Hives    .Marland KitchenHas patient had a PCN reaction causing immediate rash, facial/tongue/throat swelling, SOB or lightheadedness with hypotension: unknown Has patient had a PCN reaction causing severe rash involving mucus membranes or skin  necrosis: unknown Has patient had a PCN reaction that required hospitalization unknown Has patient had a PCN reaction occurring within the last 10 years: unknown If all of the above answers are "NO", then may proceed with Cephalosporin use.   . Telithromycin Hives    REACTION: pt states HIVES....   Prior to Admission medications   Medication Sig Start Date End Date Taking? Authorizing Provider  albuterol (PROVENTIL HFA) 108 (90 BASE) MCG/ACT inhaler Inhale 2 puffs into the lungs every 6 (six) hours as needed. For shortness of breath 06/27/14   Yes Delfina Redwood, MD  aspirin 81 MG tablet Take 81 mg by mouth daily.   Yes [provider]  budesonide-formoterol (SYMBICORT) 80-4.5 MCG/ACT inhaler Inhale 2 puffs into the lungs 2 (two) times daily as needed (wheezing).   Yes [provider]  levothyroxine (SYNTHROID, LEVOTHROID) 50 MCG tablet TAKE 1 TABLET (50 MCG TOTAL) BY MOUTH DAILY BEFORE BREAKFAST. 01/06/17  Yes Golden Circle, FNP  LORazepam (ATIVAN) 0.5 MG tablet Take 1 tablet (0.5 mg total) by mouth 2 (two) times daily as needed for anxiety. 07/26/16  Yes Hoyt Koch, MD  losartan (COZAAR) 25 MG tablet TAKE 1 TABLET (25 MG TOTAL) BY MOUTH DAILY. 01/06/17  Yes Golden Circle, FNP  metoprolol succinate (TOPROL-XL) 25 MG 24 hr tablet Take 25 mg by mouth daily.  09/18/16  Yes [provider]  Multiple Vitamins-Minerals (MULTIVITAMIN WITH MINERALS) tablet Take 1 tablet by mouth daily.   Yes [provider]   Dg Pelvis 1-2 Views  Result Date: 01/12/2017 CLINICAL DATA:  Fall, left hip pain EXAM: PELVIS - 1-2 VIEW COMPARISON:  None. FINDINGS: Visualized bony pelvis appears intact. Mild narrowing of the bilateral hip joint spaces, symmetric. Intertrochanteric left hip fracture. IMPRESSION: Intertrochanteric left hip fracture, better evaluated on dedicated femur radiographs. Electronically Signed   By: Julian Hy M.D.   On: 01/12/2017 18:46   Dg Elbow Complete Left (3+view)  Result Date: 01/12/2017 CLINICAL DATA:  Slight pain in elbow after fall. EXAM: LEFT ELBOW - COMPLETE 3+ VIEW COMPARISON:  None. FINDINGS: Osteopenia.  No joint effusion or fracture noted. IMPRESSION: Negative. Electronically Signed   By: Dorise Bullion III M.D   On: 01/12/2017 18:45   Dg Chest Port 1 View  Result Date: 01/12/2017 CLINICAL DATA:  Persistent left-sided pain after falling earlier. EXAM: PORTABLE CHEST 1 VIEW COMPARISON:  11/22/2016 FINDINGS: A single AP portable view of the chest demonstrates no focal  airspace consolidation or alveolar edema. The lungs are grossly clear. There is no large effusion or pneumothorax. Cardiac and mediastinal contours appear unremarkable. IMPRESSION: No acute findings Electronically Signed   By: Andreas Newport M.D.   On: 01/12/2017 21:40   Dg Femur Min 2 Views Left  Result Date: 01/12/2017 CLINICAL DATA:  Pain after fall EXAM: LEFT FEMUR 2 VIEWS COMPARISON:  None. FINDINGS: There is an intertrochanteric fracture best seen on AP and lateral views. No dislocation. No other femoral fracture. IMPRESSION: Intertrochanteric hip fracture. Electronically Signed   By: Dorise Bullion III M.D   On: 01/12/2017 18:48    Positive ROS: All other systems have been reviewed and were otherwise negative with the exception of those mentioned in the HPI and as above.  Objective: Labs cbc  Recent Labs  01/12/17 1956  WBC 7.9  HGB 11.4*  HCT 33.9*  PLT 217    Labs inflam No results for input(s): CRP in the last 72 hours.  Invalid input(s): ESR  Labs coag No results for input(s): INR, PTT in the last 72 hours.  Invalid input(s): PT   Recent Labs  01/12/17 1956  NA 143  K 4.2  CL 109  CO2 27  GLUCOSE 108*  BUN 14  CREATININE 0.58  CALCIUM 9.4    Physical Exam: Vitals:   01/13/17 0056 01/13/17 0426  BP: (!) 174/78 (!) 169/79  Pulse: 76 75  Resp: 20 18  Temp: 98.1 F (36.7 C) 98.2 F (36.8 C)   General: Alert, no acute distress. Supine in bed. No family bedside Mental status: Alert and Oriented x3 Neurologic: Speech Clear and organized, no gross focal findings or movement disorder appreciated. Respiratory: No cyanosis, no use of accessory musculature Cardiovascular: No pedal edema GI: Abdomen is soft and non-tender, non-distended. Skin: Warm and dry.  No lesions in the area of chief complaint  Extremities: Warm.  Left foot/ankle with 2+ pitting edema. Nontender to palpation. Pes planus. MUSCULOSKELETAL:  Left lower extremity pain with range of  motion. NVI. Sensation intact distally.   Prudencio Burly III PA-C 01/13/2017 8:01 AM

## 2017-01-13 NOTE — Progress Notes (Signed)
PROGRESS NOTE    Kathryn Whitaker  SWF:[id number] DOB: 1920-05-11 DOA: 01/12/2017 PCP: Hoyt Koch, MD    Brief Narrative: Kathryn Whitaker is a 83 y.o. female with history of hypertension, hypothyroidism, asthma and anemia was brought to the ER the patient had a fall. Pt reports she was transferring from chair to bed and she fell. She is not sure if she passed out. Her ct head without contrast is negative for acute pathology. She was found to have left hip fracture. Orthopedics consulted and she underwent hip repair.  Plan for weight bearing as tolerated.   Assessment & Plan:   Principal Problem:   Hip fracture, left (HCC) Active Problems:   HTN (hypertension)   COPD (chronic obstructive pulmonary disease) (HCC)   Hypothyroidism   Dementia   Hip fracture (HCC)   S/p left hip fracture   Left hip fracture:  Admitted for surgical repair.  Orthopedics consulted and she underwent intramedullary nail placement in the trochanter.  Pain controlled and PT evaluation inam , weight bearing as tolerated .  DVT prophylaxis as per orthopedics.    Hypertension:   sub optimally Controlled. Prn labetalol ordered.   COPD:  No wheezing or rhonchi.  Resume albuterol as needed.   Mild normocytic anemia:  Watch for anemia from blood loss. Repeat cbc in am.  Dementia: no signs of agitation.   Hypothyroidism: resume synthroid.   ? Syncope:  Pleas check orthostatics in am.     DVT prophylaxis: (aspirin. ) Code Status: (Full) Family Communication: (family at bedside, discussed the plan of care with the patient's son.  Disposition Plan: pending PT evaluation. Family wanted her to go to long tern SNF. SW aware.   Consultants:   Orthopedics  Procedures: left hip repar on 7/23  Antimicrobials: one dose of clindamycin.  Subjective: No new complaints.   Objective: Vitals:   01/13/17 0056 01/13/17 0426 01/13/17 0828 01/13/17 0904  BP: (!) 174/78 (!) 169/79 (!) 171/94 (!)  171/94  Pulse: 76 75    Resp: 20 18    Temp: 98.1 F (36.7 C) 98.2 F (36.8 C)    TempSrc: Oral Oral    SpO2: 100% 100%    Weight: 43.1 kg (95 lb 0.3 oz)     Height: 5\' 2"  (1.575 m)       Intake/Output Summary (Last 24 hours) at 01/13/17 1534 Last data filed at 01/13/17 1515  Gross per 24 hour  Intake                0 ml  Output              500 ml  Net             -500 ml   Filed Weights   01/12/17 1953 01/13/17 0056  Weight: 43.1 kg (95 lb) 43.1 kg (95 lb 0.3 oz)    Examination:  General exam: Appears calm and comfortable  Respiratory system: Clear to auscultation. Respiratory effort normal. Cardiovascular system: S1 & S2 heard, RRR. No JVD, murmurs, rubs, gallops or clicks. No pedal edema. Gastrointestinal system: Abdomen is nondistended, soft and nontender. No organomegaly or masses felt. Normal bowel sounds heard. Central nervous system: Alert and oriented. No focal neurological deficits. Extremities: left hip immobile.  Skin: No rashes, lesions or ulcers Psychiatry: Judgement and insight appear normal. Mood & affect appropriate.     Data Reviewed: I have personally reviewed following labs and imaging studies  CBC:  Recent Labs Lab  01/12/17 1956  WBC 7.9  NEUTROABS 7.1  HGB 11.4*  HCT 33.9*  MCV 92.9  PLT 841   Basic Metabolic Panel:  Recent Labs Lab 01/12/17 1956  NA 143  K 4.2  CL 109  CO2 27  GLUCOSE 108*  BUN 14  CREATININE 0.58  CALCIUM 9.4   GFR: Estimated Creatinine Clearance: 28.6 mL/min (by C-G formula based on SCr of 0.58 mg/dL). Liver Function Tests: No results for input(s): AST, ALT, ALKPHOS, BILITOT, PROT, ALBUMIN in the last 168 hours. No results for input(s): LIPASE, AMYLASE in the last 168 hours. No results for input(s): AMMONIA in the last 168 hours. Coagulation Profile: No results for input(s): INR, PROTIME in the last 168 hours. Cardiac Enzymes: No results for input(s): CKTOTAL, CKMB, CKMBINDEX, TROPONINI in the last  168 hours. BNP (last 3 results) No results for input(s): PROBNP in the last 8760 hours. HbA1C: No results for input(s): HGBA1C in the last 72 hours. CBG:  Recent Labs Lab 01/13/17 0333 01/13/17 1159  GLUCAP 88 72   Lipid Profile: No results for input(s): CHOL, HDL, LDLCALC, TRIG, CHOLHDL, LDLDIRECT in the last 72 hours. Thyroid Function Tests: No results for input(s): TSH, T4TOTAL, FREET4, T3FREE, THYROIDAB in the last 72 hours. Anemia Panel: No results for input(s): VITAMINB12, FOLATE, FERRITIN, TIBC, IRON, RETICCTPCT in the last 72 hours. Sepsis Labs: No results for input(s): PROCALCITON, LATICACIDVEN in the last 168 hours.  Recent Results (from the past 240 hour(s))  MRSA PCR Screening     Status: Abnormal   Collection Time: 01/13/17  3:39 AM  Result Value Ref Range Status   MRSA by PCR POSITIVE (A) NEGATIVE Final    Comment:        The GeneXpert MRSA Assay (FDA approved for NASAL specimens only), is one component of a comprehensive MRSA colonization surveillance program. It is not intended to diagnose MRSA infection nor to guide or monitor treatment for MRSA infections. RESULT CALLED TO, READ BACK BY AND VERIFIED WITH: RN AMY STEPHENS [id number] @[phone]  [facility]          Radiology Studies: Dg Pelvis 1-2 Views  Result Date: 01/12/2017 CLINICAL DATA:  Fall, left hip pain EXAM: PELVIS - 1-2 VIEW COMPARISON:  None. FINDINGS: Visualized bony pelvis appears intact. Mild narrowing of the bilateral hip joint spaces, symmetric. Intertrochanteric left hip fracture. IMPRESSION: Intertrochanteric left hip fracture, better evaluated on dedicated femur radiographs. Electronically Signed   By: Julian Hy M.D.   On: 01/12/2017 18:46   Dg Elbow Complete Left (3+view)  Result Date: 01/12/2017 CLINICAL DATA:  Slight pain in elbow after fall. EXAM: LEFT ELBOW - COMPLETE 3+ VIEW COMPARISON:  None. FINDINGS: Osteopenia.  No joint effusion or fracture noted. IMPRESSION: Negative.  Electronically Signed   By: Dorise Bullion III M.D   On: 01/12/2017 18:45   Ct Head Wo Contrast  Result Date: 01/13/2017 CLINICAL DATA:  Pt has had several recent falls, most recently 01/12/17, when she fell and broke her LEFT hip. No obvious injury to head; pt denies h/a; Hx of Alzheimer's EXAM: CT HEAD WITHOUT CONTRAST TECHNIQUE: Contiguous axial images were obtained from the base of the skull through the vertex without intravenous contrast. COMPARISON:  Head CT dated 06/03/2015. FINDINGS: Brain: There is generalized age related parenchymal atrophy with commensurate dilatation of the ventricles and sulci. Chronic small vessel ischemic changes again noted within the bilateral periventricular and subcortical white matter regions. There is no mass, hemorrhage, edema or other evidence of acute parenchymal abnormality. No  extra-axial hemorrhage. Vascular: There are chronic calcified atherosclerotic changes of the large vessels at the skull base. No unexpected hyperdense vessel. Skull: Normal. Negative for fracture or focal lesion. Sinuses/Orbits: Visualized upper paranasal sinuses and mastoid air cells are clear. Orbits not imaged. Other: None. IMPRESSION: 1. No acute findings.  No intracranial mass, hemorrhage or edema. 2. Chronic small vessel ischemic changes within the white matter. Electronically Signed   By: Franki Cabot M.D.   On: 01/13/2017 08:23   Dg Chest Port 1 View  Result Date: 01/12/2017 CLINICAL DATA:  Persistent left-sided pain after falling earlier. EXAM: PORTABLE CHEST 1 VIEW COMPARISON:  11/22/2016 FINDINGS: A single AP portable view of the chest demonstrates no focal airspace consolidation or alveolar edema. The lungs are grossly clear. There is no large effusion or pneumothorax. Cardiac and mediastinal contours appear unremarkable. IMPRESSION: No acute findings Electronically Signed   By: Andreas Newport M.D.   On: 01/12/2017 21:40   Dg Femur Min 2 Views Left  Result Date:  01/12/2017 CLINICAL DATA:  Pain after fall EXAM: LEFT FEMUR 2 VIEWS COMPARISON:  None. FINDINGS: There is an intertrochanteric fracture best seen on AP and lateral views. No dislocation. No other femoral fracture. IMPRESSION: Intertrochanteric hip fracture. Electronically Signed   By: Dorise Bullion III M.D   On: 01/12/2017 18:48        Scheduled Meds: . chlorhexidine  60 mL Topical Once  . [MAR Hold] Chlorhexidine Gluconate Cloth  6 each Topical Q0600  . [START ON 01/14/2017] feeding supplement  1 Container Oral TID BM  . [MAR Hold] levothyroxine  50 mcg Oral QAC breakfast  . [MAR Hold] losartan  25 mg Oral Daily  . [MAR Hold] metoprolol succinate  25 mg Oral Daily  . [MAR Hold] mometasone-formoterol  2 puff Inhalation BID  . [MAR Hold] multivitamin with minerals  1 tablet Oral Daily  . [MAR Hold] mupirocin ointment  1 application Nasal BID  . povidone-iodine  2 application Topical Once   Continuous Infusions: . lactated ringers 50 mL/hr at 01/13/17 1426     LOS: 1 day    Time spent: 40 minutes.     Hosie Poisson, MD [facility] Pager [phone]  If 7PM-7AM, please contact night-coverage [email or web address] Password [email or web address] 01/13/2017, 3:34 PM

## 2017-01-13 NOTE — Progress Notes (Signed)
Initial Nutrition Assessment  DOCUMENTATION CODES:   Severe malnutrition in context of social or environmental circumstances, Underweight  INTERVENTION:  Once diet advances, provide Boost Breeze po TID, each supplement provides 250 kcal and 9 grams of protein.  NUTRITION DIAGNOSIS:   Malnutrition (Severe) related to chronic illness (COPD) as evidenced by severe depletion of muscle mass, severe depletion of body fat.  GOAL:   Patient will meet greater than or equal to 90% of their needs  MONITOR:   Supplement acceptance, Diet advancement, Labs, Weight trends, Skin, I & O's  REASON FOR ASSESSMENT:   Consult Hip fracture protocol  ASSESSMENT:   83 y.o. female with history of hypertension, COPD, dementia, hypothyroidism, asthma and anemia was brought to the ER the patient had a fall. X-rays revealed a left hip fracture.   Pt is currently NPO for surgery today. Family at bedside reports pt's appetite has been varied PTA. On some days, pt would consume 3 meals a day and on other days pt would not eat due to lack of appetite. Pt with no significant weight loss per weight records. RD to order nutritional supplements to aid in post op healing. Noted pt reports having an allergic reaction to Ensure. RD to order [place]. Pt and family educated on the importance of adequate protein intake.   Nutrition-Focused physical exam completed. Findings are severe fat depletion, severe muscle depletion, and mild edema.   Labs and medications reviewed.   Diet Order:  Diet NPO time specified Diet NPO time specified Except for: Sips with Meds  Skin:  Reviewed, no issues  Last BM:  Unknown  Height:   Ht Readings from Last 1 Encounters:  01/13/17 5\' 2"  (1.575 m)    Weight:   Wt Readings from Last 1 Encounters:  01/13/17 95 lb 0.3 oz (43.1 kg)    Ideal Body Weight:  50 kg  BMI:  Body mass index is 17.38 kg/m.  Estimated Nutritional Needs:   Kcal:  1300-1500  Protein:  50-65  grams  Fluid:  >/= 1.5 L/day  EDUCATION NEEDS:   Education needs addressed  Corrin Parker, MS, RD, LDN Pager # [phone] After hours/ weekend pager # [phone]

## 2017-01-13 NOTE — Social Work (Signed)
CSW contacted son this afternoon and he indicated that CSW would need to f/u with him as he was dealing with mom's surgery. CSW will f/u.   Elissa Hefty, LCSW Clinical Social Worker [phone]

## 2017-01-13 NOTE — Progress Notes (Signed)
RN notified Hospitalists of patients safe arrival from [facility] via [facility].

## 2017-01-13 NOTE — Progress Notes (Signed)
Pt ready to go to short stay prior to surgery. Family in cafeteria  Family was to meet with surgeon prior to surgery.. One granddaughter in room with pt and was advised to notify family that pt was en route to surgery if they wished to speak with surgeon. Pt states she had just as soon "take a nursing home as to take surgery" . When asked if she didn't want surgery pt reply was "not really'. RN told pt she needed to share her wishes with her family and physician. Pt stated"they don't listed to me". "No one wants to take care of me anymore". Caren Griffins in short stay advised of pt comments in order to advise surgeon of pt comments.  Pt states "she will just go ahead and put it in Gods hands and if she died then she would be out of her misery"  Short stay RN advised of pt comments.

## 2017-01-13 NOTE — Progress Notes (Signed)
After receiving report from nurse on [place] ,went into the room to speak with alone. Pt. Expressed to me that she did not want surgery but she was going to have it because no one would listen to her. She stated that if she left the hospital she did not want to go home because her family did not want to take care of her any more,she wants to go to a nursing home.  I asked the pt.how she broke her hip and she said  I cannot say. Assured her that it would be alright but she said I would rather not say.  Pt. Able to state name ,birthday and what she was here for and where her surgery was to be done.  I was going to have pt. Sign her consent but son would not allow said he was POA and he signed everything,I explained to him  That she was able to tell me her name, date of birth and what she was having done,so she was capable of signing her own consent. He stated that the family needed to speak with Dr. Percell Miller first. I told him it would. I said that that would be okay.  Information relayed to Dr. Percell Miller.

## 2017-01-13 NOTE — Anesthesia Preprocedure Evaluation (Signed)
Anesthesia Evaluation  Patient identified by MRN, date of birth, ID band Patient awake    Reviewed: Allergy & Precautions, NPO status , Patient's Chart, lab work & pertinent test results, reviewed documented beta blocker date and time   History of Anesthesia Complications Negative for: history of anesthetic complications  Airway Mallampati: II  TM Distance: >3 FB Neck ROM: Full    Dental  (+) Missing, Poor Dentition   Pulmonary COPD,    breath sounds clear to auscultation       Cardiovascular hypertension, Pt. on medications (-) angina(-) Past MI and (-) CHF  Rhythm:Regular     Neuro/Psych PSYCHIATRIC DISORDERS Anxiety dementia    GI/Hepatic Neg liver ROS, GERD  Controlled,  Endo/Other  Hypothyroidism   Renal/GU negative Renal ROS     Musculoskeletal  (+) Arthritis ,   Abdominal   Peds  Hematology   Anesthesia Other Findings   Reproductive/Obstetrics                             Anesthesia Physical Anesthesia Plan  ASA: III  Anesthesia Plan: General   Post-op Pain Management:    Induction: Intravenous  PONV Risk Score and Plan: 3 and Ondansetron, Dexamethasone and Propofol  Airway Management Planned: Oral ETT  Additional Equipment: None  Intra-op Plan:   Post-operative Plan: Extubation in OR  Informed Consent: I have reviewed the patients History and Physical, chart, labs and discussed the procedure including the risks, benefits and alternatives for the proposed anesthesia with the patient or authorized representative who has indicated his/her understanding and acceptance.   Dental advisory given  Plan Discussed with: CRNA and Surgeon  Anesthesia Plan Comments:         Anesthesia Quick Evaluation

## 2017-01-13 NOTE — Progress Notes (Signed)
Left message for social worker

## 2017-01-13 NOTE — Anesthesia Procedure Notes (Signed)
Procedure Name: Intubation Date/Time: 01/13/2017 2:47 PM Performed by: Teressa Lower Pre-anesthesia Checklist: Patient identified, Emergency Drugs available, Suction available and Patient being monitored Patient Re-evaluated:Patient Re-evaluated prior to induction Oxygen Delivery Method: Circle system utilized Preoxygenation: Pre-oxygenation with 100% oxygen Induction Type: IV induction Ventilation: Mask ventilation without difficulty Laryngoscope Size: Mac and 3 Grade View: Grade I Tube type: Oral Tube size: 7.0 mm Number of attempts: 1 Airway Equipment and Method: Stylet and Oral airway Placement Confirmation: ETT inserted through vocal cords under direct vision,  positive ETCO2 and breath sounds checked- equal and bilateral Secured at: 21 cm Tube secured with: Tape Dental Injury: Teeth and Oropharynx as per pre-operative assessment

## 2017-01-13 NOTE — Consult Note (Signed)
ORTHOPAEDIC CONSULTATION  REQUESTING PHYSICIAN: Hosie Poisson, MD  Chief Complaint: Left hip pain. Fall onto left side from chair.  Assessment / plan: Principal Problem:   Hip fracture, left (HCC) Active Problems:   HTN (hypertension)   COPD (chronic obstructive pulmonary disease) (HCC)   Hypothyroidism   Dementia   Hip fracture (HCC)   S/p left hip fracture  Closed nondisplaced left femur intertrochanteric fracture Plan for possible Operative fixation this afternoon -No family at bedside this morning. Dr. Alain Marion will reattempt contact with family to discuss surgical versus nonsurgical options. -NPO  -Medicine team to admit and perform pre-op clearance -Type and Cross for 2 units held for OR -PT/OT post op -Will ammend WB status postop, bedrest for now -Foley okay for comfort to be removed POD 1/2 -Likely to require Rehab or SNF placement upon discharge.  -VTE prophylaxis: SCDs  HPI: Kathryn Whitaker is a 83 y.o. female who complains of left hip pain after fall out of a chair after she dosed off. She presented to the emergency department where x-rays showed a left hip intertrochanteric fracture. Orthopedics was consulted for evaluation.  She has significant pain in her hip this morning and requests pain medication. She denies numbness in her lower extremity.  She denies chest pain or shortness of breath.  The patient was previously ambulatory but with reported increasing dementia. She does value mobility in the prospect of bearing weight on the leg. However, she would like to discuss with her family about surgical versus nonsurgical options.  Past Medical History:  Diagnosis Date  . Acute asthmatic bronchitis   . Allergic rhinitis   . Alzheimer's dementia   . Anxiety   . Chronic constipation   . Diverticulosis of colon   . DJD (degenerative joint disease)   . GERD (gastroesophageal reflux disease)   . History of colonic polyps   . Hypercholesteremia   .  Hypertension   . IBS (irritable bowel syndrome)   . Lacunar infarction ([place])   . Lumbar back pain   . Malnutrition ([place])   . UTI (lower urinary tract infection)   . Vitamin D deficiency    Past Surgical History:  Procedure Laterality Date  . ABDOMINAL HYSTERECTOMY     Social History   Social History  . Marital status: Widowed    Spouse name: N/A  . Number of children: 6  . Years of education: N/A   Social History Main Topics  . Smoking status: Never Smoker  . Smokeless tobacco: Never Used  . Alcohol use No  . Drug use: No  . Sexual activity: Not Asked   Other Topics Concern  . None   Social History Narrative  . None   Family History  Problem Relation Age of Onset  . Hypertension Other    Allergies  Allergen Reactions  . Demerol [Meperidine]     Pt states she can't remember type of reaction  . Influenza Vaccines     Pt can't remember reaction but states allergic and has not had in 25 years  . Morphine And Related Dermatitis  . Peanuts [Peanut Oil]     Pt reports she is allergic to peanuts, but was asking to eat peanut butter. Pt unsure of her reaction.   [name] [Amoxicill-Clarithro-Lansopraz] Hives    .Marland KitchenHas patient had a PCN reaction causing immediate rash, facial/tongue/throat swelling, SOB or lightheadedness with hypotension: unknown Has patient had a PCN reaction causing severe rash involving mucus membranes or skin  necrosis: unknown Has patient had a PCN reaction that required hospitalization unknown Has patient had a PCN reaction occurring within the last 10 years: unknown If all of the above answers are "NO", then may proceed with Cephalosporin use.   . Telithromycin Hives    REACTION: pt states HIVES....   Prior to Admission medications   Medication Sig Start Date End Date Taking? Authorizing Provider  albuterol (PROVENTIL HFA) 108 (90 BASE) MCG/ACT inhaler Inhale 2 puffs into the lungs every 6 (six) hours as needed. For shortness of breath 06/27/14   Yes Sullivan, Corinna L, MD  aspirin 81 MG tablet Take 81 mg by mouth daily.   Yes [provider]  budesonide-formoterol (SYMBICORT) 80-4.5 MCG/ACT inhaler Inhale 2 puffs into the lungs 2 (two) times daily as needed (wheezing).   Yes [provider]  levothyroxine (SYNTHROID, LEVOTHROID) 50 MCG tablet TAKE 1 TABLET (50 MCG TOTAL) BY MOUTH DAILY BEFORE BREAKFAST. 01/06/17  Yes Calone, Gregory D, FNP  LORazepam (ATIVAN) 0.5 MG tablet Take 1 tablet (0.5 mg total) by mouth 2 (two) times daily as needed for anxiety. 07/26/16  Yes Crawford, Elizabeth A, MD  losartan (COZAAR) 25 MG tablet TAKE 1 TABLET (25 MG TOTAL) BY MOUTH DAILY. 01/06/17  Yes Calone, Gregory D, FNP  metoprolol succinate (TOPROL-XL) 25 MG 24 hr tablet Take 25 mg by mouth daily.  09/18/16  Yes [provider]  Multiple Vitamins-Minerals (MULTIVITAMIN WITH MINERALS) tablet Take 1 tablet by mouth daily.   Yes [provider]   Dg Pelvis 1-2 Views  Result Date: 01/12/2017 CLINICAL DATA:  Fall, left hip pain EXAM: PELVIS - 1-2 VIEW COMPARISON:  None. FINDINGS: Visualized bony pelvis appears intact. Mild narrowing of the bilateral hip joint spaces, symmetric. Intertrochanteric left hip fracture. IMPRESSION: Intertrochanteric left hip fracture, better evaluated on dedicated femur radiographs. Electronically Signed   By: Sriyesh  Krishnan M.D.   On: 01/12/2017 18:46   Dg Elbow Complete Left (3+view)  Result Date: 01/12/2017 CLINICAL DATA:  Slight pain in elbow after fall. EXAM: LEFT ELBOW - COMPLETE 3+ VIEW COMPARISON:  None. FINDINGS: Osteopenia.  No joint effusion or fracture noted. IMPRESSION: Negative. Electronically Signed   By: David  Williams III M.D   On: 01/12/2017 18:45   Dg Chest Port 1 View  Result Date: 01/12/2017 CLINICAL DATA:  Persistent left-sided pain after falling earlier. EXAM: PORTABLE CHEST 1 VIEW COMPARISON:  11/22/2016 FINDINGS: A single AP portable view of the chest demonstrates no focal  airspace consolidation or alveolar edema. The lungs are grossly clear. There is no large effusion or pneumothorax. Cardiac and mediastinal contours appear unremarkable. IMPRESSION: No acute findings Electronically Signed   By: Daniel R Mitchell M.D.   On: 01/12/2017 21:40   Dg Femur Min 2 Views Left  Result Date: 01/12/2017 CLINICAL DATA:  Pain after fall EXAM: LEFT FEMUR 2 VIEWS COMPARISON:  None. FINDINGS: There is an intertrochanteric fracture best seen on AP and lateral views. No dislocation. No other femoral fracture. IMPRESSION: Intertrochanteric hip fracture. Electronically Signed   By: David  Williams III M.D   On: 01/12/2017 18:48    Positive ROS: All other systems have been reviewed and were otherwise negative with the exception of those mentioned in the HPI and as above.  Objective: Labs cbc  Recent Labs  01/12/17 1956  WBC 7.9  HGB 11.4*  HCT 33.9*  PLT 217    Labs inflam No results for input(s): CRP in the last 72 hours.  Invalid input(s): ESR    Labs coag No results for input(s): INR, PTT in the last 72 hours.  Invalid input(s): PT   Recent Labs  01/12/17 1956  NA 143  K 4.2  CL 109  CO2 27  GLUCOSE 108*  BUN 14  CREATININE 0.58  CALCIUM 9.4    Physical Exam: Vitals:   01/13/17 0056 01/13/17 0426  BP: (!) 174/78 (!) 169/79  Pulse: 76 75  Resp: 20 18  Temp: 98.1 F (36.7 C) 98.2 F (36.8 C)   General: Alert, no acute distress. Supine in bed. No family bedside Mental status: Alert and Oriented x3 Neurologic: Speech Clear and organized, no gross focal findings or movement disorder appreciated. Respiratory: No cyanosis, no use of accessory musculature Cardiovascular: No pedal edema GI: Abdomen is soft and non-tender, non-distended. Skin: Warm and dry.  No lesions in the area of chief complaint  Extremities: Warm.  Left foot/ankle with 2+ pitting edema. Nontender to palpation. Pes planus. MUSCULOSKELETAL:  Left lower extremity pain with range of  motion. NVI. Sensation intact distally.   Kolden Dupee Calvin Martensen III PA-C 01/13/2017 8:01 AM 

## 2017-01-13 NOTE — Interval H&P Note (Signed)
History and Physical Interval Note:  01/13/2017 2:42 PM  Kathryn Whitaker  has presented today for surgery, with the diagnosis of Fractured Left Hip  The various methods of treatment have been discussed with the patient and family. After consideration of risks, benefits and other options for treatment, the patient has consented to  Procedure(s): INTRAMEDULLARY (IM) NAIL FEMORAL (Left) as a surgical intervention .  The patient's history has been reviewed, patient examined, no change in status, stable for surgery.  I have reviewed the patient's chart and labs.  Questions were answered to the patient's satisfaction.     [name]

## 2017-01-14 ENCOUNTER — Encounter (HOSPITAL_COMMUNITY): Payer: Self-pay | Admitting: Orthopedic Surgery

## 2017-01-14 LAB — CBC
HCT: 26.5 % — ABNORMAL LOW (ref 36.0–46.0)
Hemoglobin: 8.5 g/dL — ABNORMAL LOW (ref 12.0–15.0)
MCH: 30.6 pg (ref 26.0–34.0)
MCHC: 32.1 g/dL (ref 30.0–36.0)
MCV: 95.3 fL (ref 78.0–100.0)
PLATELETS: 193 10*3/uL (ref 150–400)
RBC: 2.78 MIL/uL — ABNORMAL LOW (ref 3.87–5.11)
RDW: 14 % (ref 11.5–15.5)
WBC: 8.4 10*3/uL (ref 4.0–10.5)

## 2017-01-14 LAB — GLUCOSE, CAPILLARY
Glucose-Capillary: 113 mg/dL — ABNORMAL HIGH (ref 65–99)
Glucose-Capillary: 120 mg/dL — ABNORMAL HIGH (ref 65–99)
Glucose-Capillary: 122 mg/dL — ABNORMAL HIGH (ref 65–99)
Glucose-Capillary: 125 mg/dL — ABNORMAL HIGH (ref 65–99)
Glucose-Capillary: 204 mg/dL — ABNORMAL HIGH (ref 65–99)

## 2017-01-14 MED ORDER — ASPIRIN EC 325 MG PO TBEC: 325.0000 mg | DELAYED_RELEASE_TABLET | Freq: Every day | ORAL | 0 refills | Status: DC

## 2017-01-14 MED ORDER — WHITE PETROLATUM GEL
Status: AC
  Administered 2017-01-14: 18:00:00
  Filled 2017-01-14: qty 1

## 2017-01-14 MED ORDER — TRAMADOL HCL 50 MG PO TABS
50.0000 mg | ORAL_TABLET | Freq: Two times a day (BID) | ORAL | Status: DC | PRN
  Administered 2017-01-14 – 2017-01-15 (×3): 50 mg via ORAL
  Filled 2017-01-14 (×3): qty 1

## 2017-01-14 MED ORDER — TRAMADOL HCL 50 MG PO TABS: 50.0000 mg | ORAL_TABLET | Freq: Two times a day (BID) | ORAL | 0 refills | Status: DC | PRN

## 2017-01-14 MED ORDER — FAMOTIDINE 20 MG PO TABS: 20.0000 mg | ORAL_TABLET | Freq: Two times a day (BID) | ORAL | 0 refills | Status: DC

## 2017-01-14 NOTE — Progress Notes (Signed)
   Assessment / Plan:: 1 Day Post-Op  S/P Procedure(s) (LRB): TROCHANTERIC INTRAMEDULLARY (IM) NAIL FEMORAL (Left) by Dr. Ernesta Amble. Percell Miller on 01/13/17  Principal Problem:   Hip fracture, left ([place]) Active Problems:   HTN (hypertension)   COPD (chronic obstructive pulmonary disease) (HCC)   Hypothyroidism   Dementia   Hip fracture (HCC)   S/p left hip fracture Acute Blood Loss Anemia, Hgb 8.5 <11.4.  Likely with dilutional component.  Resting comfortably POD1.  Hungry.  No complaints.  Lateral hip a little sore.    Advance diet Up with therapy Incentive Spirometry Apply ice  Weight Bearing: Weight Bearing as Tolerated (WBAT) LLE Dressings: Maintain mepilex.  VTE prophylaxis: Aspirin, SCDs, ambulation Dispo: Skilled Nursing Facility/Rehab when cleared medically   Subjective: Patient reports pain as mild.  Hungry.  Pain controlled with IV and PO meds.  Tolerating diet.  No lightheadedness, dizziness, CP, SOB.  Not yet OOB.  Objective:   VITALS:   Vitals:   01/13/17 2014 01/13/17 2143 01/14/17 0041 01/14/17 0520  BP:  120/64 107/65 (!) 94/47  Pulse:  68 72 74  Resp:  18 18 16   Temp:  98.7 F (37.1 C) 98.6 F (37 C) 98.8 F (37.1 C)  TempSrc:  Oral Oral Oral  SpO2: 99% 98% 100% 97%  Weight:      Height:       CBC Latest Ref Rng & Units 01/14/2017 01/12/2017 01/23/2016  WBC 4.0 - 10.5 K/uL 8.4 7.9 2.5 Repeated and verified X2.(L)  Hemoglobin 12.0 - 15.0 g/dL 8.5(L) 11.4(L) 12.6  Hematocrit 36.0 - 46.0 % 26.5(L) 33.9(L) 37.7  Platelets 150 - 400 K/uL 193 217 219.0   BMP Latest Ref Rng & Units 01/12/2017 01/23/2016 06/06/2015  Glucose 65 - 99 mg/dL 108(H) 79 78  BUN 6 - 20 mg/dL 14 14 13   Creatinine 0.44 - 1.00 mg/dL 0.58 0.55 0.57  Sodium 135 - 145 mmol/L 143 141 137  Potassium 3.5 - 5.1 mmol/L 4.2 3.9 4.8  Chloride 101 - 111 mmol/L 109 107 106  CO2 22 - 32 mmol/L 27 26 27   Calcium 8.9 - 10.3 mg/dL 9.4 9.3 8.9   Intake/Output      07/23 0701 - 07/24 0700   P.O.  540   I.V. (mL/kg) 965.8 (22.4)   Total Intake(mL/kg) 1505.8 (34.9)   Urine (mL/kg/hr) 300 (0.3)   Blood 200   Total Output 500   Net +1005.8         Physical Exam: General: NAD.  Supine in bed.  Calm, interactive.  Repeats questions.  No increased WOB. MSK LLE: Neurovascularly intact Sensation intact distally Feet warm Dorsiflexion/Plantar flexion intact Incision: dressing C/D/I   Prudencio Burly III, PA-C 01/14/2017, 6:51 AM

## 2017-01-14 NOTE — Evaluation (Signed)
Occupational Therapy Evaluation Patient Details Name: Kathryn Whitaker MRN: [id number] DOB: 1919/09/17 Today's Date: 01/14/2017    History of Present Illness Kathryn Whitaker is a 83 y.o. female with history of hypertension, hypothyroidism, asthma and anemia was brought to the ER the patient had a fall. Pt reports she was transferring from chair to bed and she fell. She is not sure if she passed out. Her ct head without contrast is negative for acute pathology. She was found to have left hip fracture. Orthopedics consulted and she underwent IM nail hip repair.    Clinical Impression   Pt with decline in function and safety with ADLs and ADL mobility with decreased strength, balance, endurance and hx of cognitive impairments. Pt with slow processing of instruction, anxious/fearful during mobility, require extensive assist with ADLs and most likely +2 assist with mobility. Pt is a poor historian and and uncertain of PLOF stated is accurate at this time. Pt would benefit from acute OT services to address impairments to increase level of function and safety    Follow Up Recommendations  SNF;Supervision/Assistance - 24 hour    Equipment Recommendations  Other (comment) (TBD at next venue of care)    Recommendations for Other Services       Precautions / Restrictions Precautions Precautions: Fall Restrictions Weight Bearing Restrictions: Yes LLE Weight Bearing: Weight bearing as tolerated      Mobility Bed Mobility               General bed mobility comments: pt up in recliner upon arrival  Transfers Overall transfer level: Needs assistance Equipment used: Rolling walker (2 wheeled) Transfers: Sit to/from Stand Sit to Stand: +2 physical assistance;+2 safety/equipment         General transfer comment: attempted x 2 to stand pt to RW to transefr to [facility] with staff, however pt resistive with increased pain in L LE    Balance Overall balance assessment: Needs  assistance Sitting-balance support: Bilateral upper extremity supported;Feet supported Sitting balance-Leahy Scale: Poor         Standing balance comment: NT                           ADL either performed or assessed with clinical judgement   ADL Overall ADL's : Needs assistance/impaired     Grooming: Wash/dry face;Sitting;Wash/dry hands;Set up;Supervision/safety;Cueing for sequencing (supported sitting)   Upper Body Bathing: Moderate assistance;Sitting;Cueing for sequencing (supported sitting)   Lower Body Bathing: Total assistance;Sitting/lateral leans   Upper Body Dressing : Moderate assistance;Sitting (supported sitting)   Lower Body Dressing: Sitting/lateral leans;Total assistance     Toilet Transfer Details (indicate cue type and reason): attempted x 2 to stand pt to RW to transefr to [facility] with staff, however pt resistive with increased pain in L LE         Functional mobility during ADLs:  (attempted x 2 to stand pt to RW to transefr to [facility] with staff, however pt resistive with increased pain in L LE) General ADL Comments: attempted x 2 to stand pt to RW to transefr to [facility] with staff, however pt resistive with increased pain in L LE     Vision Baseline Vision/History: Wears glasses Wears Glasses: Reading only Patient Visual Report: No change from baseline                  Pertinent Vitals/Pain Pain Assessment: Faces Faces Pain Scale: Hurts even more Pain Location: during mobility Pain  Descriptors / Indicators: Grimacing;Guarding;Operative site guarding Pain Intervention(s): Limited activity within patient's tolerance;Monitored during session;Premedicated before session;Repositioned     Hand Dominance Right   Extremity/Trunk Assessment Upper Extremity Assessment Upper Extremity Assessment: Generalized weakness   Lower Extremity Assessment Lower Extremity Assessment: Defer to PT evaluation       Communication Communication Communication:  No difficulties   Cognition Arousal/Alertness: Awake/alert Behavior During Therapy: Anxious Overall Cognitive Status: No family/caregiver present to determine baseline cognitive functioning (hx of cognitive impairments)                                 General Comments: hx of cognitive impairments   General Comments   pt very pleasant               Home Living Family/patient expects to be discharged to:: Skilled nursing facility                                 Additional Comments: pt's grandson and his wife live with her      Prior Functioning/Environment Level of Independence: Needs assistance  Gait / Transfers Assistance Needed: pt reports that she used a RW ADL's / Homemaking Assistance Needed: Pt reports that someone "comes in" to asssit her with bathing and dressing            OT Problem List: Decreased strength;Impaired balance (sitting and/or standing);Decreased cognition;Decreased knowledge of precautions;Pain;Decreased activity tolerance;Decreased knowledge of use of DME or AE;Decreased coordination      OT Treatment/Interventions: Self-care/ADL training;DME and/or AE instruction;Therapeutic activities;Therapeutic exercise;Neuromuscular education;Patient/family education;Balance training    OT Goals(Current goals can be found in the care plan section) Acute Rehab OT Goals Patient Stated Goal: no place like home OT Goal Formulation: With patient Time For Goal Achievement: 01/21/17 Potential to Achieve Goals: Good ADL Goals Pt Will Perform Grooming: with min assist;standing Pt Will Perform Upper Body Bathing: with min assist;sitting Pt Will Perform Lower Body Bathing: with max assist;with mod assist;sitting/lateral leans Pt Will Perform Upper Body Dressing: with min assist;sitting Pt Will Transfer to Toilet: with max assist;stand pivot transfer;bedside commode;with +2 assist  OT Frequency: Min 2X/week   Barriers to D/C: Decreased  caregiver support                        AM-PAC PT "6 Clicks" Daily Activity     Outcome Measure Help from another person eating meals?: None Help from another person taking care of personal grooming?: A Little Help from another person toileting, which includes using toliet, bedpan, or urinal?: Total Help from another person bathing (including washing, rinsing, drying)?: Total Help from another person to put on and taking off regular upper body clothing?: Total Help from another person to put on and taking off regular lower body clothing?: Total 6 Click Score: 11   End of Session Equipment Utilized During Treatment: Gait belt;Rolling walker;Other (comment) (BSC)  Activity Tolerance: Patient limited by pain;Other (comment) (cognition) Patient left: in chair;with call bell/phone within reach;with chair alarm set  OT Visit Diagnosis: Pain;History of falling (Z91.81);Muscle weakness (generalized) (M62.81);Other symptoms and signs involving cognitive function;Unsteadiness on feet (R26.81) Pain - Right/Left: Left Pain - part of body: Leg;Hip                Time: 3428-7681 OT Time Calculation (min): 29 min Charges:  OT General Charges $  OT Visit: 1 Procedure OT Evaluation $OT Eval Moderate Complexity: 1 Procedure OT Treatments $Therapeutic Activity: 8-22 mins G-Codes: OT G-codes **NOT FOR INPATIENT CLASS** Functional Assessment Whitaker Used: AM-PAC 6 Clicks Daily Activity     Britt Bottom 01/14/2017, 12:54 PM

## 2017-01-14 NOTE — Social Work (Addendum)
[facility] has made a bed offer and family has accepted.  CSW faxed clinicals to [employer]. CSW will f/u on insurance auth.  Elissa Hefty, LCSW Clinical Social Worker [phone]

## 2017-01-14 NOTE — NC FL2 (Signed)
[place] MEDICAID FL2 LEVEL OF CARE SCREENING TOOL     IDENTIFICATION  Patient Name: Kathryn Whitaker Birthdate: 30-Jan-1920 Sex: female Admission Date (Current Location): 01/12/2017  [facility] and [place] Number:  Herbalist and Address:  [facility], [address], [place], [place] [address]      Provider Number: [id number]  Attending Physician Name and Address:  Hosie Poisson, MD  Relative Name and Phone Number:  son, Mr. Wyvonnia Dusky, [phone],  [phone] (mobile)    Current Level of Care: Hospital Recommended Level of Care: [place] Prior Approval Number:    Date Approved/Denied: 01/14/17 PASRR Number: [id number]  Discharge Plan: Home    Current Diagnoses: Patient Active Problem List   Diagnosis Date Noted  . Hip fracture, left ([place]) 01/12/2017  . Hip fracture ([place]) 01/12/2017  . S/p left hip fracture 01/12/2017  . Dementia 07/26/2016  . Dysuria 08/10/2015  . Hypothyroidism 06/06/2015  . Protein-calorie malnutrition, severe ([place]) 06/26/2014  . Constipation 12/17/2013  . COPD (chronic obstructive pulmonary disease) ([place]) 06/03/2013  . IBS (irritable bowel syndrome) 12/30/2012  . Generalized anxiety disorder 12/30/2012  . Dyslipidemia 12/12/2012  . HTN (hypertension) 12/12/2012  . Hx of completed stroke 07/11/2009    Orientation RESPIRATION BLADDER Height & Weight     Self, Time, Situation, Place  Normal Continent Weight: 95 lb 0.3 oz (43.1 kg) Height:  5\' 2"  (157.5 cm)  BEHAVIORAL SYMPTOMS/MOOD NEUROLOGICAL BOWEL NUTRITION STATUS      Continent Diet (See DC Summary)  AMBULATORY STATUS COMMUNICATION OF NEEDS Skin   Extensive Assist Verbally Surgical wounds (Left Hip Closed Incision, Adhesive Bandage)                       Personal Care Assistance Level of Assistance  Bathing, Feeding, Dressing Bathing Assistance: Maximum assistance Feeding assistance: Limited assistance Dressing Assistance:  Maximum assistance     Functional Limitations Info             SPECIAL CARE FACTORS FREQUENCY  PT (By licensed PT), OT (By licensed OT)     PT Frequency: 3x week OT Frequency: 2x week            Contractures      Additional Factors Info  Code Status, Allergies, Isolation Precautions Code Status Info: Full Code Allergies Info: DEMEROL MEPERIDINE, INFLUENZA VACCINES, MORPHINE AND RELATED, PEANUTS PEANUT OIL, PREVPAC AMOXICILL-CLARITHRO-LANSOPRAZ, TELITHROMYCIN      Isolation Precautions Info: MRSA     Current Medications (01/14/2017):  This is the current hospital active medication list Current Facility-Administered Medications  Medication Dose Route Frequency Provider Last Rate Last Dose  . acetaminophen (TYLENOL) tablet 650 mg  650 mg Oral Q6H PRN Prudencio Burly III, PA-C   650 mg at 01/13/17 2041   Or  . acetaminophen (TYLENOL) suppository 650 mg  650 mg Rectal Q6H PRN Prudencio Burly III, PA-C      . albuterol (PROVENTIL) (2.5 MG/3ML) 0.083% nebulizer solution 3 mL  3 mL Inhalation Q6H PRN Rise Patience, MD      . aspirin EC tablet 325 mg  325 mg Oral Q breakfast Prudencio Burly III, PA-C   325 mg at 01/14/17 1108  . Chlorhexidine Gluconate Cloth 2 % PADS 6 each  6 each Topical Q0600 Renette Butters, MD   6 each at 01/14/17 [phone]  . docusate sodium (COLACE) capsule 100 mg  100 mg Oral BID Prudencio Burly III,  PA-C   100 mg at 01/14/17 1107  . feeding supplement (BOOST / RESOURCE BREEZE) liquid 1 Container  1 Container Oral TID BM Hosie Poisson, MD      . fentaNYL (SUBLIMAZE) injection 12.5 mcg  12.5 mcg Intravenous Q2H PRN Rise Patience, MD   12.5 mcg at 01/13/17 0956  . labetalol (NORMODYNE,TRANDATE) injection 10 mg  10 mg Intravenous Q4H PRN Hosie Poisson, MD      . lactated ringers infusion   Intravenous Continuous Prudencio Burly III, PA-C 50 mL/hr at 01/13/17 1730    . levothyroxine (SYNTHROID, LEVOTHROID)  tablet 50 mcg  50 mcg Oral QAC breakfast Rise Patience, MD   50 mcg at 01/14/17 0548  . LORazepam (ATIVAN) tablet 0.5 mg  0.5 mg Oral BID PRN Rise Patience, MD      . losartan (COZAAR) tablet 25 mg  25 mg Oral Daily Rise Patience, MD      . metoprolol succinate (TOPROL-XL) 24 hr tablet 25 mg  25 mg Oral Daily Rise Patience, MD   25 mg at 01/13/17 5726  . mometasone-formoterol (DULERA) 100-5 MCG/ACT inhaler 2 puff  2 puff Inhalation BID Rise Patience, MD   2 puff at 01/14/17 1002  . multivitamin with minerals tablet 1 tablet  1 tablet Oral Daily Rise Patience, MD   1 tablet at 01/14/17 1107  . mupirocin ointment (BACTROBAN) 2 % 1 application  1 application Nasal BID Renette Butters, MD      . polyethylene glycol (MIRALAX / GLYCOLAX) packet 17 g  17 g Oral Daily PRN Prudencio Burly III, PA-C      . traMADol Veatrice Bourbon) tablet 50 mg  50 mg Oral BID PRN Prudencio Burly III, PA-C   50 mg at 01/14/17 1108     Discharge Medications: Please see discharge summary for a list of discharge medications.  Relevant Imaging Results:  Relevant Lab Results:   Additional Information SS#:[id number] Braxton, LCSW

## 2017-01-14 NOTE — Evaluation (Signed)
Physical Therapy Evaluation Patient Details Name: Kathryn Whitaker MRN: [id number] DOB: [birth year] Today's Date: 01/14/2017   History of Present Illness  Kathryn Whitaker is a 83 y.o. female with history of hypertension, hypothyroidism, asthma and anemia was brought to the ER the patient had a fall. Pt reports she was transferring from chair to bed and she fell. She is not sure if she passed out. Her ct head without contrast is negative for acute pathology. She was found to have left hip fracture. Orthopedics consulted and she underwent IM nail hip repair.   Clinical Impression  Pt admitted with above. Per patient report pt was indep with amb but required assist with dressing and bathing. Pt now requires maxA for all mobility due to L LE pain. Pt to benefit from ST-SNF to address below deficits and achieve safe level of function for safe transition back home with family.    Follow Up Recommendations SNF    Equipment Recommendations  None recommended by PT (TBD by next facility)    Recommendations for Other Services       Precautions / Restrictions Precautions Precautions: Fall Restrictions Weight Bearing Restrictions: Yes RLE Weight Bearing: Weight bearing as tolerated LLE Weight Bearing: Weight bearing as tolerated      Mobility  Bed Mobility Overal bed mobility: Needs Assistance Bed Mobility: Supine to Sit     Supine to sit: Max assist;+2 for physical assistance     General bed mobility comments: max A for LE management and trunk elevation, used helicopter technique using bed pad  Transfers Overall transfer level: Needs assistance Equipment used: Rolling walker (2 wheeled) Transfers: Sit to/from [facility] Sit to Stand: +2 physical assistance;+2 safety/equipment;Max assist Stand pivot transfers: Max assist;+2 physical assistance       General transfer comment: pt transfered to bedside commode and then to chair  Ambulation/Gait              General Gait Details: unable  Stairs            Wheelchair Mobility    Modified Rankin (Stroke Patients Only)       Balance Overall balance assessment: Needs assistance Sitting-balance support: Bilateral upper extremity supported;Feet supported Sitting balance-Leahy Scale: Poor     Standing balance support: Bilateral upper extremity supported Standing balance-Leahy Scale: Zero Standing balance comment: pt dependent on assist                             Pertinent Vitals/Pain Pain Assessment: Faces Faces Pain Scale: Hurts even more Pain Location: during mobility Pain Descriptors / Indicators: Grimacing;Guarding;Operative site guarding Pain Intervention(s): Monitored during session    Home Living Family/patient expects to be discharged to:: Skilled nursing facility                 Additional Comments: pt's grandson and his wife live with her, however questionable hisotry    Prior Function Level of Independence: Needs assistance   Gait / Transfers Assistance Needed: pt with inconsistent report of PLOF, unsure if she used AD  ADL's / Homemaking Assistance Needed: Pt reports that someone "comes in" to asssit her with bathing and dressing        Hand Dominance   Dominant Hand: Right    Extremity/Trunk Assessment   Upper Extremity Assessment Upper Extremity Assessment: Generalized weakness    Lower Extremity Assessment Lower Extremity Assessment: RLE deficits/detail;LLE deficits/detail RLE Deficits / Details: generalized weakness LLE Deficits /  Details: minimal active movement, can initiate quad set, resists hip/knee flexion    Cervical / Trunk Assessment Cervical / Trunk Assessment: Kyphotic  Communication   Communication: No difficulties  Cognition Arousal/Alertness: Awake/alert Behavior During Therapy: WFL for tasks assessed/performed Overall Cognitive Status: No family/caregiver present to determine baseline cognitive functioning  (hx of cognitive impairments)                                 General Comments: pt with h/o alzheimers. pt very appreciative of services      General Comments General comments (skin integrity, edema, etc.): dressing intact on L LE    Exercises Total Joint Exercises Quad Sets: AROM;Both;5 reps;Supine   Assessment/Plan    PT Assessment Patient needs continued PT services  PT Problem List Decreased strength;Decreased range of motion;Decreased activity tolerance;Decreased balance;Decreased mobility;Decreased coordination;Decreased cognition;Decreased knowledge of use of DME;Decreased safety awareness;Decreased knowledge of precautions;Pain       PT Treatment Interventions Gait training;Functional mobility training;Therapeutic activities;Therapeutic exercise;DME instruction;Balance training    PT Goals (Current goals can be found in the Care Plan section)  Acute Rehab PT Goals Patient Stated Goal: didn't state PT Goal Formulation: Patient unable to participate in goal setting Time For Goal Achievement: 01/28/17 Potential to Achieve Goals: Good    Frequency Min 3X/week   Barriers to discharge Decreased caregiver support family can not provide maximal assist    Co-evaluation               AM-PAC PT "6 Clicks" Daily Activity  Outcome Measure Difficulty turning over in bed (including adjusting bedclothes, sheets and blankets)?: Total Difficulty moving from lying on back to sitting on the side of the bed? : Total Difficulty sitting down on and standing up from a chair with arms (e.g., wheelchair, bedside commode, etc,.)?: Total Help needed moving to and from a bed to chair (including a wheelchair)?: Total Help needed walking in hospital room?: Total Help needed climbing 3-5 steps with a railing? : Total 6 Click Score: 6    End of Session Equipment Utilized During Treatment: Gait belt Activity Tolerance: Patient tolerated treatment well Patient left: in  chair;with call bell/phone within reach;with nursing/sitter in room Nurse Communication: Mobility status (+2 assist) PT Visit Diagnosis: Difficulty in walking, not elsewhere classified (R26.2)    Time: 1000-1039 PT Time Calculation (min) (ACUTE ONLY): 39 min   Charges:   PT Evaluation $PT Eval Moderate Complexity: 1 Procedure PT Treatments $Therapeutic Activity: 23-37 mins   PT G Codes:        Kittie Plater, PT, DPT Pager #: [phone] Office #: [phone]   Matthews Franks M Shanekia Latella 01/14/2017, 1:36 PM

## 2017-01-14 NOTE — Social Work (Signed)
CSW received return call from son, Mr. Ruffin Frederick. He confirmed that patient resides with nephew and spouse and he comes from [place] weekly to spend time with mom.  He indicated that patient was residing with him Marijo File in the past however wanted to be closer to friends and family in [place]. Son confirmed that he wld like mom to return to [facility] in [facility] as she has been in the past.   CSW explained the process for Insurance Auth and will follow up on same once SNF makes a bed offers.   CSW will continue to follow.  Elissa Hefty, LCSW Clinical Social Worker [phone]

## 2017-01-14 NOTE — Anesthesia Postprocedure Evaluation (Signed)
Anesthesia Post Note  Patient: Kathryn Whitaker  Procedure(s) Performed: Procedure(s) (LRB): TROCHANTERIC INTRAMEDULLARY (IM) NAIL FEMORAL (Left)     Patient location during evaluation: PACU Anesthesia Type: General Level of consciousness: awake and patient cooperative Pain management: pain level controlled Vital Signs Assessment: post-procedure vital signs reviewed and stable Respiratory status: spontaneous breathing, nonlabored ventilation, respiratory function stable and patient connected to nasal cannula oxygen Cardiovascular status: stable Postop Assessment: no signs of nausea or vomiting Anesthetic complications: no    Last Vitals:  Vitals:   01/14/17 0041 01/14/17 0520  BP: 107/65 (!) 94/47  Pulse: 72 74  Resp: 18 16  Temp: 37 C 37.1 C    Last Pain:  Vitals:   01/14/17 0549  TempSrc:   PainSc: 0-No pain                 [name]

## 2017-01-14 NOTE — Progress Notes (Signed)
PROGRESS NOTE    Kathryn Whitaker  OIZ:[id number] DOB: 1919/07/28 DOA: 01/12/2017 PCP: Hoyt Koch, MD    Brief Narrative: Kathryn Whitaker is a 83 y.o. female with history of hypertension, hypothyroidism, asthma and anemia was brought to the ER the patient had a fall. Pt reports she was transferring from chair to bed and she fell. She is not sure if she passed out. Her ct head without contrast is negative for acute pathology. She was found to have left hip fracture. Orthopedics consulted and she underwent hip repair.  Plan for weight bearing as tolerated.   Assessment & Plan:   Principal Problem:   Hip fracture, left (HCC) Active Problems:   HTN (hypertension)   COPD (chronic obstructive pulmonary disease) (HCC)   Hypothyroidism   Dementia   Hip fracture (HCC)   S/p left hip fracture   Left hip fracture:  Admitted for surgical repair.  Orthopedics consulted and she underwent intramedullary nail placement in the trochanter.  Pain controlled and PT evaluation recommended SNF, weight bearing as tolerated .  DVT prophylaxis as per orthopedics.    Hypertension:    Controlled. Prn labetalol ordered.   COPD:  No wheezing or rhonchi.  Resume albuterol as needed.   Mild normocytic anemia/ ANEMIA of blood loss. Hemoglobin dropped to 8.5 . Transfuse to keep hemoglobin greater than 7.    Dementia: no signs of agitation.   Hypothyroidism: resume synthroid.   ? Syncope:  Check orthostatics in am.     DVT prophylaxis: (aspirin. ) Code Status: (Full) Family Communication:  None at bedside.  Disposition Plan: possible SNF in am.   Consultants:   Orthopedics  Procedures: left hip repar on 7/23  Antimicrobials: one dose of clindamycin.  Subjective: No new complaints. Pain controlled.   Objective: Vitals:   01/14/17 0041 01/14/17 0520 01/14/17 1113 01/14/17 1434  BP: 107/65 (!) 94/47 (!) 99/54 (!) 104/51  Pulse: 72 74 69 79  Resp: 18 16  17   Temp: 98.6 F  (37 C) 98.8 F (37.1 C)  97.8 F (36.6 C)  TempSrc: Oral Oral  Tympanic  SpO2: 100% 97%  100%  Weight:      Height:        Intake/Output Summary (Last 24 hours) at 01/14/17 1745 Last data filed at 01/14/17 1500  Gross per 24 hour  Intake             1975 ml  Output              400 ml  Net             1575 ml   Filed Weights   01/12/17 1953 01/13/17 0056  Weight: 43.1 kg (95 lb) 43.1 kg (95 lb 0.3 oz)    Examination:  General exam: Appears calm and comfortable  Respiratory system: Clear to auscultation. Respiratory effort normal. Cardiovascular system: S1 & S2 heard, RRR. No JVD, murmurs, rubs, gallops or clicks. No pedal edema. Gastrointestinal system: Abdomen is nondistended, soft and nontender. No organomegaly or masses felt. Normal bowel sounds heard. Central nervous system: Alert and oriented. No focal neurological deficits. Extremities: left hip  Tenderness. Able to move right hip without any pain.  Skin: No rashes, lesions or ulcers Psychiatry: Judgement and insight appear normal. Mood & affect appropriate.     Data Reviewed: I have personally reviewed following labs and imaging studies  CBC:  Recent Labs Lab 01/12/17 1956 01/14/17 0411  WBC 7.9 8.4  NEUTROABS 7.1  --  HGB 11.4* 8.5*  HCT 33.9* 26.5*  MCV 92.9 95.3  PLT 217 053   Basic Metabolic Panel:  Recent Labs Lab 01/12/17 1956  NA 143  K 4.2  CL 109  CO2 27  GLUCOSE 108*  BUN 14  CREATININE 0.58  CALCIUM 9.4   GFR: Estimated Creatinine Clearance: 28.6 mL/min (by C-G formula based on SCr of 0.58 mg/dL). Liver Function Tests: No results for input(s): AST, ALT, ALKPHOS, BILITOT, PROT, ALBUMIN in the last 168 hours. No results for input(s): LIPASE, AMYLASE in the last 168 hours. No results for input(s): AMMONIA in the last 168 hours. Coagulation Profile: No results for input(s): INR, PROTIME in the last 168 hours. Cardiac Enzymes: No results for input(s): CKTOTAL, CKMB, CKMBINDEX,  TROPONINI in the last 168 hours. BNP (last 3 results) No results for input(s): PROBNP in the last 8760 hours. HbA1C: No results for input(s): HGBA1C in the last 72 hours. CBG:  Recent Labs Lab 01/13/17 1159 01/13/17 1727 01/14/17 0049 01/14/17 1153 01/14/17 1704  GLUCAP 72 89 204* 122* 120*   Lipid Profile: No results for input(s): CHOL, HDL, LDLCALC, TRIG, CHOLHDL, LDLDIRECT in the last 72 hours. Thyroid Function Tests: No results for input(s): TSH, T4TOTAL, FREET4, T3FREE, THYROIDAB in the last 72 hours. Anemia Panel: No results for input(s): VITAMINB12, FOLATE, FERRITIN, TIBC, IRON, RETICCTPCT in the last 72 hours. Sepsis Labs: No results for input(s): PROCALCITON, LATICACIDVEN in the last 168 hours.  Recent Results (from the past 240 hour(s))  MRSA PCR Screening     Status: Abnormal   Collection Time: 01/13/17  3:39 AM  Result Value Ref Range Status   MRSA by PCR POSITIVE (A) NEGATIVE Final    Comment:        The GeneXpert MRSA Assay (FDA approved for NASAL specimens only), is one component of a comprehensive MRSA colonization surveillance program. It is not intended to diagnose MRSA infection nor to guide or monitor treatment for MRSA infections. RESULT CALLED TO, READ BACK BY AND VERIFIED WITH: RN AMY STEPHENS [id number] @0542  [facility]          Radiology Studies: Dg Pelvis 1-2 Views  Result Date: 01/12/2017 CLINICAL DATA:  Fall, left hip pain EXAM: PELVIS - 1-2 VIEW COMPARISON:  None. FINDINGS: Visualized bony pelvis appears intact. Mild narrowing of the bilateral hip joint spaces, symmetric. Intertrochanteric left hip fracture. IMPRESSION: Intertrochanteric left hip fracture, better evaluated on dedicated femur radiographs. Electronically Signed   By: Julian Hy M.D.   On: 01/12/2017 18:46   Dg Elbow Complete Left (3+view)  Result Date: 01/12/2017 CLINICAL DATA:  Slight pain in elbow after fall. EXAM: LEFT ELBOW - COMPLETE 3+ VIEW COMPARISON:  None.  FINDINGS: Osteopenia.  No joint effusion or fracture noted. IMPRESSION: Negative. Electronically Signed   By: Dorise Bullion III M.D   On: 01/12/2017 18:45   Ct Head Wo Contrast  Result Date: 01/13/2017 CLINICAL DATA:  Pt has had several recent falls, most recently 01/12/17, when she fell and broke her LEFT hip. No obvious injury to head; pt denies h/a; Hx of Alzheimer's EXAM: CT HEAD WITHOUT CONTRAST TECHNIQUE: Contiguous axial images were obtained from the base of the skull through the vertex without intravenous contrast. COMPARISON:  Head CT dated 06/03/2015. FINDINGS: Brain: There is generalized age related parenchymal atrophy with commensurate dilatation of the ventricles and sulci. Chronic small vessel ischemic changes again noted within the bilateral periventricular and subcortical white matter regions. There is no mass, hemorrhage, edema or other evidence of  acute parenchymal abnormality. No extra-axial hemorrhage. Vascular: There are chronic calcified atherosclerotic changes of the large vessels at the skull base. No unexpected hyperdense vessel. Skull: Normal. Negative for fracture or focal lesion. Sinuses/Orbits: Visualized upper paranasal sinuses and mastoid air cells are clear. Orbits not imaged. Other: None. IMPRESSION: 1. No acute findings.  No intracranial mass, hemorrhage or edema. 2. Chronic small vessel ischemic changes within the white matter. Electronically Signed   By: Franki Cabot M.D.   On: 01/13/2017 08:23   Dg Chest Port 1 View  Result Date: 01/12/2017 CLINICAL DATA:  Persistent left-sided pain after falling earlier. EXAM: PORTABLE CHEST 1 VIEW COMPARISON:  11/22/2016 FINDINGS: A single AP portable view of the chest demonstrates no focal airspace consolidation or alveolar edema. The lungs are grossly clear. There is no large effusion or pneumothorax. Cardiac and mediastinal contours appear unremarkable. IMPRESSION: No acute findings Electronically Signed   By: Andreas Newport  M.D.   On: 01/12/2017 21:40   Dg C-arm 1-60 Min  Result Date: 01/13/2017 CLINICAL DATA:  Left femur IM nail. Fluoro time: 33 seconds. Radiation safety timeout performed. No image was saved of the distal AP femur per MD. EXAM: DG C-ARM 61-120 MIN COMPARISON:  None. FINDINGS: Three intraoperative fluoroscopic images are provided showing intramedullary rod and screw fixation of the left femur neck and shaft. Hardware appears appropriately positioned. No evidence of surgical complicating feature. Fluoroscopy was provided for 33 seconds. IMPRESSION: Fluoroscopy provided for 33 seconds. Electronically Signed   By: Franki Cabot M.D.   On: 01/13/2017 15:35   Dg Femur Min 2 Views Left  Result Date: 01/13/2017 CLINICAL DATA:  Left femur IM nail EXAM: LEFT FEMUR 2 VIEWS COMPARISON:  01/12/2017 FINDINGS: Three low resolution intraoperative spot views of the proximal left femur. Total fluoroscopy time was 33 seconds. The images demonstrate intramedullary rod fixation of proximal left femur across trochanteric fracture. IMPRESSION: Intraoperative fluoroscopic assistance provided during internal fixation of proximal femur fracture. Electronically Signed   By: Donavan Foil M.D.   On: 01/13/2017 15:37   Dg Femur Min 2 Views Left  Result Date: 01/12/2017 CLINICAL DATA:  Pain after fall EXAM: LEFT FEMUR 2 VIEWS COMPARISON:  None. FINDINGS: There is an intertrochanteric fracture best seen on AP and lateral views. No dislocation. No other femoral fracture. IMPRESSION: Intertrochanteric hip fracture. Electronically Signed   By: Dorise Bullion III M.D   On: 01/12/2017 18:48   Dg Femur Port Min 2 Views Left  Result Date: 01/13/2017 CLINICAL DATA:  Left hip fracture postoperative images. EXAM: LEFT FEMUR PORTABLE 2 VIEWS COMPARISON:  01/12/2017 FINDINGS: Intramedullary nail placed in the left femur. The nail extends into the distal femur. There is a dynamic hip screw extending through the left femoral head and neck. Soft  tissue lucency compatible with recent surgery. Again noted is a left intertrochanteric femur fracture. Left hip is located. IMPRESSION: Internal fixation of the left femoral intertrochanteric fracture. No complicating features. Electronically Signed   By: Markus Daft M.D.   On: 01/13/2017 16:41        Scheduled Meds: . aspirin EC  325 mg Oral Q breakfast  . Chlorhexidine Gluconate Cloth  6 each Topical Q0600  . docusate sodium  100 mg Oral BID  . feeding supplement  1 Container Oral TID BM  . levothyroxine  50 mcg Oral QAC breakfast  . losartan  25 mg Oral Daily  . metoprolol succinate  25 mg Oral Daily  . mometasone-formoterol  2 puff Inhalation  BID  . multivitamin with minerals  1 tablet Oral Daily  . mupirocin ointment  1 application Nasal BID   Continuous Infusions: . lactated ringers 50 mL/hr at 01/13/17 1730     LOS: 2 days    Time spent: 30 minutes.     Hosie Poisson, MD [facility] Pager [phone]  If 7PM-7AM, please contact night-coverage [email or web address] Password [facility] 01/14/2017, 5:45 PM

## 2017-01-14 NOTE — Clinical Social Work Note (Addendum)
Clinical Social Work Assessment  Patient Details  Name: Kathryn Whitaker MRN: [id number] Date of Birth: [birth year]  Date of referral:  01/14/17               Reason for consult:  Facility Placement                Permission sought to share information with:    Permission granted to share information::  Yes, Verbal Permission Granted  Name::     son, Wyvonnia Dusky  Agency::  SNF  Relationship::     Contact Information:     Housing/Transportation Living arrangements for the past 2 months:  [place] of Information:  Patient, Friend/Neighbor Patient Interpreter Needed:  None Criminal Activity/Legal Involvement Pertinent to Current Situation/Hospitalization:  No - Comment as needed Significant Relationships:  Adult Children, Other Family Members, Friend Lives with:  Relatives Do you feel safe going back to the place where you live?  No Need for family participation in patient care:  Yes (Comment)  Care giving concerns:  Pt resides with family at home and had a fall.  Pt is not safe to return home at this time and will need short term rehab.  Social Worker assessment / plan:  CSW met with patient at bedside, however son was not there but two family friends were present. They indicated they would let son know CSW came to meet. They indicated that the son wanted patient to go to [facility] as she was in that SNF in the past. CSW will f/u with son to confirm. CSW obtained permission to send out bed offers from patient.   CSW called son, [name] and left msge requesting a call back to discuss DC plan/placemnt.  FL2 pending as PT notes needed. Passr confirmed. Offers pending.  Employment status:  Retired Nurse, adult PT Recommendations:  [place] / Referral to community resources:  [place]  Patient/Family's Response to care:  Patient appreciative of CSW assistance and reports no issues  or concerns.  Patient/Family's Understanding of and Emotional Response to Diagnosis, Current Treatment, and Prognosis:  Patient has good understanding of diagnosis, current treatment and prognosis. Patient/family hopeful that her physical impairments will improve. No issues or concerns reported.  Emotional Assessment Appearance:  Appears stated age Attitude/Demeanor/Rapport:   (Cooperative) Affect (typically observed):  Accepting, Appropriate Orientation:  Oriented to Self, Oriented to Place, Oriented to  Time, Oriented to Situation Alcohol / Substance use:  Not Applicable Psych involvement (Current and /or in the community):  No (Comment)  Discharge Needs  Concerns to be addressed:  Care Coordination Readmission within the last 30 days:  No Current discharge risk:  Dependent with Mobility, Physical Impairment Barriers to Discharge:  No Barriers Identified   Normajean Baxter, LCSW 01/14/2017, 11:57 AM

## 2017-01-15 DIAGNOSIS — F0280 Dementia in other diseases classified elsewhere without behavioral disturbance: Secondary | ICD-10-CM

## 2017-01-15 DIAGNOSIS — J449 Chronic obstructive pulmonary disease, unspecified: Secondary | ICD-10-CM

## 2017-01-15 DIAGNOSIS — I10 Essential (primary) hypertension: Secondary | ICD-10-CM

## 2017-01-15 DIAGNOSIS — G301 Alzheimer's disease with late onset: Secondary | ICD-10-CM

## 2017-01-15 DIAGNOSIS — S72002A Fracture of unspecified part of neck of left femur, initial encounter for closed fracture: Secondary | ICD-10-CM

## 2017-01-15 DIAGNOSIS — S72145A Nondisplaced intertrochanteric fracture of left femur, initial encounter for closed fracture: Principal | ICD-10-CM

## 2017-01-15 DIAGNOSIS — D62 Acute posthemorrhagic anemia: Secondary | ICD-10-CM

## 2017-01-15 MED ORDER — BOOST / RESOURCE BREEZE PO LIQD: 1.0000 | Freq: Three times a day (TID) | ORAL | 0 refills | Status: DC

## 2017-01-15 MED ORDER — FERROUS SULFATE 325 (65 FE) MG PO TABS: 325.0000 mg | ORAL_TABLET | Freq: Every day | ORAL | 0 refills | Status: DC

## 2017-01-15 MED ORDER — POLYETHYLENE GLYCOL 3350 17 G PO PACK: 17.0000 g | PACK | Freq: Every day | ORAL | 0 refills | Status: DC | PRN

## 2017-01-15 NOTE — Clinical Social Work Placement (Signed)
   CLINICAL SOCIAL WORK PLACEMENT  NOTE  Date:  01/15/2017  Patient Details  Name: Kathryn Whitaker MRN: [id number] Date of Birth: 11/28/1919  Clinical Social Work is seeking post-discharge placement for this patient at the [place] level of care (*CSW will initial, date and re-position this form in  chart as items are completed):  Yes   Patient/family provided with [place] Work Department's list of facilities offering this level of care within the geographic area requested by the patient (or if unable, by the patient's family).  Yes   Patient/family informed of their freedom to choose among providers that offer the needed level of care, that participate in Medicare, Medicaid or managed care program needed by the patient, have an available bed and are willing to accept the patient.  Yes   Patient/family informed of [facility]'s ownership interest in [facility] and [facility], as well as of the fact that they are under no obligation to receive care at these facilities.  PASRR submitted to EDS on       PASRR number received on 01/13/17     Existing PASRR number confirmed on 01/13/17     FL2 transmitted to all facilities in geographic area requested by pt/family on 01/13/17     FL2 transmitted to all facilities within larger geographic area on 01/13/17     Patient informed that his/her managed care company has contracts with or will negotiate with certain facilities, including the following:        Yes   Patient/family informed of bed offers received.  Patient chooses bed at [place], [place]     Physician recommends and patient chooses bed at      Patient to be transferred to [place], [place] on 01/15/17.  Patient to be transferred to facility by [facility]     Patient family notified on 01/15/17 of transfer.  Name of family member notified:  son, [name]     PHYSICIAN Please prepare priority discharge summary,  including medications, Please prepare prescriptions, Please sign FL2     Additional Comment:    _______________________________________________ Normajean Baxter, LCSW 01/15/2017, 12:44 PM

## 2017-01-15 NOTE — Progress Notes (Signed)
Called report to nurse Estill Bamberg at [facility]. Reviewed HPI, PMH, dietary and mobility concerns, most recent bowel movement, recent vital signs, wound care, and follow up appointment. [facility] scheduled to pick up patient at 1400. Will continue to monitor until time of discharge.

## 2017-01-15 NOTE — Plan of Care (Signed)
Problem: Education: Goal: Knowledge of [facility] Education information/materials will improve Outcome: Progressing POC reviewed with pt. 7p-7a shift.

## 2017-01-15 NOTE — Social Work (Signed)
Clinical Social Worker facilitated patient discharge including contacting patient family and facility to confirm patient discharge plans.  Clinical information faxed to facility and family agreeable with plan.   CSW arranged ambulance transport via [facility] to [facility].  Pt going to Room 103A.  RN to call [phone] to give report prior to discharge.  Clinical Social Worker will sign off for now as social work intervention is no longer needed. Please consult [place] again if new need arises.  Elissa Hefty, LCSW Clinical Social Worker [phone]

## 2017-01-15 NOTE — Discharge Summary (Signed)
Physician Discharge Summary  Kathryn Whitaker OZH:[id number] DOB: December 04, 1919 DOA: 01/12/2017  PCP: Hoyt Koch, MD  Admit date: 01/12/2017 Discharge date: 01/15/2017  Admitted From: SNF Disposition:  SNF  Recommendations for Outpatient Follow-up:  1. Follow up with orthopedic surgery, Dr. Percell Miller, in two weeks 2. Palliative care consultation please given hip fracture, frailty, and age 83. Anemia/iron deficiency reevaluation in 1 month  Home Health:  Ongoing PT/OT  Equipment/Devices: none  Discharge Condition:  Stable, improved CODE STATUS:  Full code  Diet recommendation:  Regular with supplements  Brief/Interim Summary:  The patient is a 83 yo F with history hypertension, hypothyroidism, asthma and anemia who was brought to the ER after she had a fall transferring from the chair to the bed.  She was found to have a left hip fracture.  She underwent ORIF on 7/23 by Dr. Percell Miller.  Her blood pressures were low normal during this hospitalization and her metoprolol and losartan have been discontinued.  Her instability/fall may have been the result of transient hypotension from overmedication.  She is quite frail and cachectic and would benefit from a palliative care consultation to discuss prognosis and GOC.    Discharge Diagnoses:  Principal Problem:   Hip fracture, left ([place]) Active Problems:   HTN (hypertension)   COPD (chronic obstructive pulmonary disease) (HCC)   Hypothyroidism   Dementia   Hip fracture (HCC)   S/p left hip fracture   Acute blood loss anemia  Left hip fracture s/p intramedullary nail placement in the trochanter on 7/23. Pain controlled and PT evaluation recommended SNF, weight bearing as tolerated .  DVT prophylaxis as per orthopedics:  Aspirin 325mg  daily  Hypertension, but orthostatic/hypotensive during hospitalization. -  Discontinued losartan and metoprolol -  Received IVF  COPD, No wheezing or rhonchi.  - continue prn albuterol  Mild  normocytic anemia/ ANEMIA of acute blood loss. Hemoglobin dropped to 8.5.  Did not require blood transfusion but started iron supplementation for next month.  Dementia: no signs of agitation.   Hypothyroidism: continued synthroid.   Discharge Instructions  Discharge Instructions    Call MD for:  difficulty breathing, headache or visual disturbances    Complete by:  As directed    Call MD for:  extreme fatigue    Complete by:  As directed    Call MD for:  hives    Complete by:  As directed    Call MD for:  persistant dizziness or light-headedness    Complete by:  As directed    Call MD for:  persistant nausea and vomiting    Complete by:  As directed    Call MD for:  redness, tenderness, or signs of infection (pain, swelling, redness, odor or green/yellow discharge around incision site)    Complete by:  As directed    Call MD for:  severe uncontrolled pain    Complete by:  As directed    Call MD for:  temperature >100.4    Complete by:  As directed    Diet general    Complete by:  As directed    Increase activity slowly    Complete by:  As directed        Medication List    STOP taking these medications   aspirin 81 MG tablet Replaced by:  aspirin EC 325 MG tablet   LORazepam 0.5 MG tablet Commonly known as:  ATIVAN   losartan 25 MG tablet Commonly known as:  COZAAR   metoprolol succinate 25  MG 24 hr tablet Commonly known as:  TOPROL-XL     TAKE these medications   albuterol 108 (90 Base) MCG/ACT inhaler Commonly known as:  PROVENTIL HFA Inhale 2 puffs into the lungs every 6 (six) hours as needed. For shortness of breath   aspirin EC 325 MG tablet Take 1 tablet (325 mg total) by mouth daily. For 30 days post op for DVT Prophylaxis.  Resume 81 mg ASA when completed. Replaces:  aspirin 81 MG tablet   budesonide-formoterol 80-4.5 MCG/ACT inhaler Commonly known as:  SYMBICORT Inhale 2 puffs into the lungs 2 (two) times daily as needed (wheezing).    famotidine 20 MG tablet Commonly known as:  PEPCID Take 1 tablet (20 mg total) by mouth 2 (two) times daily. For 30 days post op for gastroprotection.   feeding supplement Liqd Take 1 Container by mouth 3 (three) times daily between meals.   ferrous sulfate 325 (65 FE) MG tablet Take 1 tablet (325 mg total) by mouth daily.   levothyroxine 50 MCG tablet Commonly known as:  SYNTHROID, LEVOTHROID TAKE 1 TABLET (50 MCG TOTAL) BY MOUTH DAILY BEFORE BREAKFAST.   multivitamin with minerals tablet Take 1 tablet by mouth daily.   polyethylene glycol packet Commonly known as:  MIRALAX / GLYCOLAX Take 17 g by mouth daily as needed for mild constipation.   traMADol 50 MG tablet Commonly known as:  ULTRAM Take 1 tablet (50 mg total) by mouth 2 (two) times daily as needed for moderate pain.       Contact information for follow-up providers    Renette Butters, MD Follow up in 2 week(s).   Specialty:  Orthopedic Surgery Contact information: [address] [place] [address] [phone]            Contact information for after-discharge care    Destination    [facility] Follow up.   Specialty:  Skilled Nursing Facility Contact information: [address] [place] [phone]                 Allergies  Allergen Reactions  . Demerol [Meperidine]     Pt states she can't remember type of reaction  . Influenza Vaccines     Pt can't remember reaction but states allergic and has not had in 25 years  . Morphine And Related Dermatitis  . Peanuts [Peanut Oil]     Pt reports she is allergic to peanuts, but was asking to eat peanut butter. Pt unsure of her reaction.   [name] [Amoxicill-Clarithro-Lansopraz] Hives    .Marland KitchenHas patient had a PCN reaction causing immediate rash, facial/tongue/throat swelling, SOB or lightheadedness with hypotension: unknown Has patient had a PCN reaction causing severe  rash involving mucus membranes or skin necrosis: unknown Has patient had a PCN reaction that required hospitalization unknown Has patient had a PCN reaction occurring within the last 10 years: unknown If all of the above answers are "NO", then may proceed with Cephalosporin use.   . Telithromycin Hives    REACTION: pt states HIVES....    Consultations: Dr. Percell Miller, Orthopedic surgery   Procedures/Studies: Dg Pelvis 1-2 Views  Result Date: 01/12/2017 CLINICAL DATA:  Fall, left hip pain EXAM: PELVIS - 1-2 VIEW COMPARISON:  None. FINDINGS: Visualized bony pelvis appears intact. Mild narrowing of the bilateral hip joint spaces, symmetric. Intertrochanteric left hip fracture. IMPRESSION: Intertrochanteric left hip fracture, better evaluated on dedicated femur radiographs. Electronically Signed   By: Bertis Ruddy  [place] Pink M.D.   On: 01/12/2017 18:46   Dg Elbow Complete Left (3+view)  Result Date: 01/12/2017 CLINICAL DATA:  Slight pain in elbow after fall. EXAM: LEFT ELBOW - COMPLETE 3+ VIEW COMPARISON:  None. FINDINGS: Osteopenia.  No joint effusion or fracture noted. IMPRESSION: Negative. Electronically Signed   By: Dorise Bullion III M.D   On: 01/12/2017 18:45   Ct Head Wo Contrast  Result Date: 01/13/2017 CLINICAL DATA:  Pt has had several recent falls, most recently 01/12/17, when she fell and broke her LEFT hip. No obvious injury to head; pt denies h/a; Hx of Alzheimer's EXAM: CT HEAD WITHOUT CONTRAST TECHNIQUE: Contiguous axial images were obtained from the base of the skull through the vertex without intravenous contrast. COMPARISON:  Head CT dated 06/03/2015. FINDINGS: Brain: There is generalized age related parenchymal atrophy with commensurate dilatation of the ventricles and sulci. Chronic small vessel ischemic changes again noted within the bilateral periventricular and subcortical white matter regions. There is no mass, hemorrhage, edema or other evidence of acute parenchymal abnormality.  No extra-axial hemorrhage. Vascular: There are chronic calcified atherosclerotic changes of the large vessels at the skull base. No unexpected hyperdense vessel. Skull: Normal. Negative for fracture or focal lesion. Sinuses/Orbits: Visualized upper paranasal sinuses and mastoid air cells are clear. Orbits not imaged. Other: None. IMPRESSION: 1. No acute findings.  No intracranial mass, hemorrhage or edema. 2. Chronic small vessel ischemic changes within the white matter. Electronically Signed   By: Franki Cabot M.D.   On: 01/13/2017 08:23   Dg Chest Port 1 View  Result Date: 01/12/2017 CLINICAL DATA:  Persistent left-sided pain after falling earlier. EXAM: PORTABLE CHEST 1 VIEW COMPARISON:  11/22/2016 FINDINGS: A single AP portable view of the chest demonstrates no focal airspace consolidation or alveolar edema. The lungs are grossly clear. There is no large effusion or pneumothorax. Cardiac and mediastinal contours appear unremarkable. IMPRESSION: No acute findings Electronically Signed   By: Andreas Newport M.D.   On: 01/12/2017 21:40   Dg C-arm 1-60 Min  Result Date: 01/13/2017 CLINICAL DATA:  Left femur IM nail. Fluoro time: 33 seconds. Radiation safety timeout performed. No image was saved of the distal AP femur per MD. EXAM: DG C-ARM 61-120 MIN COMPARISON:  None. FINDINGS: Three intraoperative fluoroscopic images are provided showing intramedullary rod and screw fixation of the left femur neck and shaft. Hardware appears appropriately positioned. No evidence of surgical complicating feature. Fluoroscopy was provided for 33 seconds. IMPRESSION: Fluoroscopy provided for 33 seconds. Electronically Signed   By: Franki Cabot M.D.   On: 01/13/2017 15:35   Dg Femur Min 2 Views Left  Result Date: 01/13/2017 CLINICAL DATA:  Left femur IM nail EXAM: LEFT FEMUR 2 VIEWS COMPARISON:  01/12/2017 FINDINGS: Three low resolution intraoperative spot views of the proximal left femur. Total fluoroscopy time was 33  seconds. The images demonstrate intramedullary rod fixation of proximal left femur across trochanteric fracture. IMPRESSION: Intraoperative fluoroscopic assistance provided during internal fixation of proximal femur fracture. Electronically Signed   By: Donavan Foil M.D.   On: 01/13/2017 15:37   Dg Femur Min 2 Views Left  Result Date: 01/12/2017 CLINICAL DATA:  Pain after fall EXAM: LEFT FEMUR 2 VIEWS COMPARISON:  None. FINDINGS: There is an intertrochanteric fracture best seen on AP and lateral views. No dislocation. No other femoral fracture. IMPRESSION: Intertrochanteric hip fracture. Electronically Signed   By: Dorise Bullion III M.D   On: 01/12/2017 18:48   Dg Femur Port Min 2 Views  Left  Result Date: 01/13/2017 CLINICAL DATA:  Left hip fracture postoperative images. EXAM: LEFT FEMUR PORTABLE 2 VIEWS COMPARISON:  01/12/2017 FINDINGS: Intramedullary nail placed in the left femur. The nail extends into the distal femur. There is a dynamic hip screw extending through the left femoral head and neck. Soft tissue lucency compatible with recent surgery. Again noted is a left intertrochanteric femur fracture. Left hip is located. IMPRESSION: Internal fixation of the left femoral intertrochanteric fracture. No complicating features. Electronically Signed   By: Markus Daft M.D.   On: 01/13/2017 16:41   Subjective: Still having some throbbing pains in the left hip.  Bilateral legs are numb.  Feels that her thinking is not clear.  Has intermittent SOB which is chronic.    Discharge Exam: Vitals:   01/15/17 0630 01/15/17 0834  BP: (!) 95/51 (!) 96/48  Pulse: 79 76  Resp: 18   Temp: 99.1 F (37.3 C)    Vitals:   01/14/17 2205 01/15/17 0630 01/15/17 0834 01/15/17 1013  BP: (!) 98/47 (!) 95/51 (!) 96/48   Pulse: 85 79 76   Resp: 18 18    Temp: 99.7 F (37.6 C) 99.1 F (37.3 C)    TempSrc: Oral Oral    SpO2: 99% 99%  97%  Weight:      Height:        General: Cachectic/frail female,  awake/alert, not in acute distress Cardiovascular: RRR, 3/6 systolic murmur at the RUSB and heard across precordium, no rubs, no gallops Respiratory: CTA bilaterally, no wheezing, no rhonchi Abdominal: Soft, NT, ND, bowel sounds + Extremities: trace LLE edema, no RLE edema.  Decreased muscle mass/bulk.  Some swelling along left lateral hip.  Two dressings seen which are clean and dry.  No palpable hematoma or surrounding erythema or ecchymosis.   The results of significant diagnostics from this hospitalization (including imaging, microbiology, ancillary and laboratory) are listed below for reference.     Microbiology: Recent Results (from the past 240 hour(s))  MRSA PCR Screening     Status: Abnormal   Collection Time: 01/13/17  3:39 AM  Result Value Ref Range Status   MRSA by PCR POSITIVE (A) NEGATIVE Final    Comment:        The GeneXpert MRSA Assay (FDA approved for NASAL specimens only), is one component of a comprehensive MRSA colonization surveillance program. It is not intended to diagnose MRSA infection nor to guide or monitor treatment for MRSA infections. RESULT CALLED TO, READ BACK BY AND VERIFIED WITH: RN AMY STEPHENS [id number] @0542  THANEY      Labs: BNP (last 3 results) No results for input(s): BNP in the last 8760 hours. Basic Metabolic Panel:  Recent Labs Lab 01/12/17 1956  NA 143  K 4.2  CL 109  CO2 27  GLUCOSE 108*  BUN 14  CREATININE 0.58  CALCIUM 9.4   Liver Function Tests: No results for input(s): AST, ALT, ALKPHOS, BILITOT, PROT, ALBUMIN in the last 168 hours. No results for input(s): LIPASE, AMYLASE in the last 168 hours. No results for input(s): AMMONIA in the last 168 hours. CBC:  Recent Labs Lab 01/12/17 1956 01/14/17 0411  WBC 7.9 8.4  NEUTROABS 7.1  --   HGB 11.4* 8.5*  HCT 33.9* 26.5*  MCV 92.9 95.3  PLT 217 193   Cardiac Enzymes: No results for input(s): CKTOTAL, CKMB, CKMBINDEX, TROPONINI in the last 168  hours. BNP: Invalid input(s): POCBNP CBG:  Recent Labs Lab 01/14/17 0049 01/14/17 1153 01/14/17 1704  01/14/17 2200 01/14/17 2356  GLUCAP 204* 122* 120* 125* 113*   D-Dimer No results for input(s): DDIMER in the last 72 hours. Hgb A1c No results for input(s): HGBA1C in the last 72 hours. Lipid Profile No results for input(s): CHOL, HDL, LDLCALC, TRIG, CHOLHDL, LDLDIRECT in the last 72 hours. Thyroid function studies No results for input(s): TSH, T4TOTAL, T3FREE, THYROIDAB in the last 72 hours.  Invalid input(s): FREET3 Anemia work up No results for input(s): VITAMINB12, FOLATE, FERRITIN, TIBC, IRON, RETICCTPCT in the last 72 hours. Urinalysis    Component Value Date/Time   COLORURINE YELLOW 11/22/2016 1517   APPEARANCEUR CLEAR 11/22/2016 1517   LABSPEC 1.017 11/22/2016 1517   PHURINE 6.0 11/22/2016 1517   GLUCOSEU NEGATIVE 11/22/2016 1517   GLUCOSEU NEGATIVE 06/03/2013 1035   HGBUR SMALL (A) 11/22/2016 1517   BILIRUBINUR NEGATIVE 11/22/2016 1517   BILIRUBINUR 1+ 07/26/2016 1027   KETONESUR 5 (A) 11/22/2016 1517   PROTEINUR 30 (A) 11/22/2016 1517   UROBILINOGEN 1.0 07/26/2016 1027   UROBILINOGEN 0.2 03/13/2015 1255   NITRITE NEGATIVE 11/22/2016 1517   LEUKOCYTESUR TRACE (A) 11/22/2016 1517   Sepsis Labs Invalid input(s): PROCALCITONIN,  WBC,  LACTICIDVEN   Time coordinating discharge: Over 30 minutes  SIGNED:   Janece Canterbury, MD  [facility] 01/15/2017, 12:29 PM Pager   If 7PM-7AM, please contact night-coverage [email or web address] Password [email or web address]

## 2017-01-15 NOTE — Care Management Note (Signed)
Case Management Note  Patient Details  Name: Kathryn Whitaker MRN: [id number] Date of Birth: Oct 27, 1919  Subjective/Objective:     Left hip fracture,  s/p intramedullary nail placement in the trochanter on 7/23               Action/Plan: Discharge Planning: Chart reviewed. CSW following for SNF placement. Scheduled dc today to rehab.  PCP Hoyt Koch MD  Expected Discharge Date:  01/15/17               Expected Discharge Plan:  [place]  In-House Referral:  Clinical Social Work  Discharge planning Services  CM Consult  Post Acute Care Choice:  NA Choice offered to:  NA  DME Arranged:  N/A DME Agency:  NA  HH Arranged:  NA HH Agency:  NA  Status of Service:  Completed, signed off  If discussed at [employer] of Stay Meetings, dates discussed:    Additional Comments:  Erenest Rasher, RN 01/15/2017, 2:16 PM

## 2017-01-15 NOTE — Progress Notes (Signed)
Paged Dr. Sheran Fava regarding hypotension this AM, and to notify that losartan and metoprolol were held, also LR was resumed at 85mL/hr.   Vitals:   01/15/17 0630 01/15/17 0834  BP: (!) 95/51 (!) 96/48  Pulse: 79 76  Resp: 18   Temp: 99.1 F (37.3 C)

## 2017-01-15 NOTE — Progress Notes (Signed)
   Assessment / Plan:: 2 Days Post-Op  S/P Procedure(s) (LRB): TROCHANTERIC INTRAMEDULLARY (IM) NAIL FEMORAL (Left) by Dr. Ernesta Amble. Kathryn Whitaker on 01/13/17  Principal Problem:   Hip fracture, left ([place]) Active Problems:   HTN (hypertension)   COPD (chronic obstructive pulmonary disease) (HCC)   Hypothyroidism   Dementia   Hip fracture (HCC)   S/p left hip fracture Acute Blood Loss Anemia  Resting comfortably POD1.  Still Hungry.  No complaints.  Lateral hip less sore.    Up with therapy Incentive Spirometry Apply ice prn  Weight Bearing: Weight Bearing as Tolerated (WBAT) LLE Dressings: Maintain mepilex.  VTE prophylaxis: Aspirin, SCDs, ambulation Dispo: Skilled Nursing Facility/Rehab when cleared medically   Subjective: Patient reports pain as mild.  Continues to be Hungry.  No CP, SOB.    Objective:   VITALS:   Vitals:   01/14/17 1434 01/14/17 2102 01/14/17 2205 01/15/17 0630  BP: (!) 104/51  (!) 98/47 (!) 95/51  Pulse: 79  85 79  Resp: 17  18 18   Temp: 97.8 F (36.6 C)  99.7 F (37.6 C) 99.1 F (37.3 C)  TempSrc: Tympanic  Oral Oral  SpO2: 100% 98% 99% 99%  Weight:      Height:       CBC Latest Ref Rng & Units 01/14/2017 01/12/2017 01/23/2016  WBC 4.0 - 10.5 K/uL 8.4 7.9 2.5 Repeated and verified X2.(L)  Hemoglobin 12.0 - 15.0 g/dL 8.5(L) 11.4(L) 12.6  Hematocrit 36.0 - 46.0 % 26.5(L) 33.9(L) 37.7  Platelets 150 - 400 K/uL 193 217 219.0   BMP Latest Ref Rng & Units 01/12/2017 01/23/2016 06/06/2015  Glucose 65 - 99 mg/dL 108(H) 79 78  BUN 6 - 20 mg/dL 14 14 13   Creatinine 0.44 - 1.00 mg/dL 0.58 0.55 0.57  Sodium 135 - 145 mmol/L 143 141 137  Potassium 3.5 - 5.1 mmol/L 4.2 3.9 4.8  Chloride 101 - 111 mmol/L 109 107 106  CO2 22 - 32 mmol/L 27 26 27   Calcium 8.9 - 10.3 mg/dL 9.4 9.3 8.9   Intake/Output      07/24 0701 - 07/25 0700 07/25 0701 - 07/26 0700   P.O. 720    I.V. (mL/kg) 435 (10.1)    Total Intake(mL/kg) 1155 (26.8)    Urine (mL/kg/hr) 500 (0.5)    Total Output 500     Net +655          Urine Occurrence 1 x    Stool Occurrence 1 x      Physical Exam: General: NAD.  Supine in bed.  Calm, interactive.  No increased WOB. MSK LLE: Neurovascularly intact Sensation intact distally Feet warm Dorsiflexion/Plantar flexion intact Incision: dressing C/D/I   Kathryn Burly III, PA-C 01/15/2017, 7:52 AM

## 2017-01-15 NOTE — Social Work (Signed)
CSW received [place] # [phone] for placement at [facility].  CSW will f/u for dc to snf when ready.  Elissa Hefty, LCSW Clinical Social Worker [phone]

## 2017-01-17 ENCOUNTER — Telehealth: Payer: Self-pay | Admitting: *Deleted

## 2017-01-17 NOTE — Telephone Encounter (Signed)
Pt was on TCM list admitted 01/12/17 for Hip fracture, left side after falling from transferring from the chair to the bed. She underwent ORIF on 7/23 by Dr. Percell Miller. Pt was D/C 01/15/17, and sent to SNF. Per d/c summary will f/u w/Dr. Percell Miller in 2 weeks.Marland KitchenJohny Chess

## 2017-03-03 ENCOUNTER — Telehealth: Payer: Self-pay | Admitting: Internal Medicine

## 2017-03-03 NOTE — Telephone Encounter (Signed)
Fine

## 2017-03-03 NOTE — Telephone Encounter (Signed)
Kathryn Whitaker with [facility] [phone]   Need verbal for OT for ADL training  2 week 4

## 2017-03-03 NOTE — Telephone Encounter (Signed)
Called Kathryn Whitaker no answer LMOM w/MD response...Kathryn Whitaker

## 2017-03-07 DIAGNOSIS — Z7982 Long term (current) use of aspirin: Secondary | ICD-10-CM | POA: Diagnosis not present

## 2017-03-07 DIAGNOSIS — F419 Anxiety disorder, unspecified: Secondary | ICD-10-CM

## 2017-03-07 DIAGNOSIS — W19XXXD Unspecified fall, subsequent encounter: Secondary | ICD-10-CM | POA: Diagnosis not present

## 2017-03-07 DIAGNOSIS — Z9181 History of falling: Secondary | ICD-10-CM | POA: Diagnosis not present

## 2017-03-07 DIAGNOSIS — F0390 Unspecified dementia without behavioral disturbance: Secondary | ICD-10-CM | POA: Diagnosis not present

## 2017-03-07 DIAGNOSIS — S72145D Nondisplaced intertrochanteric fracture of left femur, subsequent encounter for closed fracture with routine healing: Secondary | ICD-10-CM | POA: Diagnosis not present

## 2017-03-07 DIAGNOSIS — I10 Essential (primary) hypertension: Secondary | ICD-10-CM | POA: Diagnosis not present

## 2017-03-07 DIAGNOSIS — J449 Chronic obstructive pulmonary disease, unspecified: Secondary | ICD-10-CM | POA: Diagnosis not present

## 2017-03-07 DIAGNOSIS — Z8673 Personal history of transient ischemic attack (TIA), and cerebral infarction without residual deficits: Secondary | ICD-10-CM | POA: Diagnosis not present

## 2017-03-07 DIAGNOSIS — M1990 Unspecified osteoarthritis, unspecified site: Secondary | ICD-10-CM | POA: Diagnosis not present

## 2017-04-01 ENCOUNTER — Emergency Department (HOSPITAL_COMMUNITY): Payer: Medicare Other

## 2017-04-01 ENCOUNTER — Emergency Department (HOSPITAL_COMMUNITY)
Admission: EM | Admit: 2017-04-01 | Discharge: 2017-04-01 | Disposition: A | Discharge status: Home / Self Care | Payer: Medicare Other | Attending: Emergency Medicine | Admitting: Emergency Medicine

## 2017-04-01 ENCOUNTER — Encounter (HOSPITAL_COMMUNITY): Payer: Self-pay | Admitting: Emergency Medicine

## 2017-04-01 DIAGNOSIS — S0990XA Unspecified injury of head, initial encounter: Secondary | ICD-10-CM | POA: Insufficient documentation

## 2017-04-01 DIAGNOSIS — Z79899 Other long term (current) drug therapy: Secondary | ICD-10-CM | POA: Insufficient documentation

## 2017-04-01 DIAGNOSIS — W19XXXA Unspecified fall, initial encounter: Secondary | ICD-10-CM | POA: Insufficient documentation

## 2017-04-01 DIAGNOSIS — Z91010 Allergy to peanuts: Secondary | ICD-10-CM | POA: Diagnosis not present

## 2017-04-01 DIAGNOSIS — Y939 Activity, unspecified: Secondary | ICD-10-CM | POA: Insufficient documentation

## 2017-04-01 DIAGNOSIS — M25551 Pain in right hip: Secondary | ICD-10-CM

## 2017-04-01 DIAGNOSIS — E7800 Pure hypercholesterolemia, unspecified: Secondary | ICD-10-CM | POA: Insufficient documentation

## 2017-04-01 DIAGNOSIS — Y998 Other external cause status: Secondary | ICD-10-CM | POA: Diagnosis not present

## 2017-04-01 DIAGNOSIS — M25552 Pain in left hip: Secondary | ICD-10-CM

## 2017-04-01 DIAGNOSIS — Y92129 Unspecified place in nursing home as the place of occurrence of the external cause: Secondary | ICD-10-CM | POA: Diagnosis not present

## 2017-04-01 DIAGNOSIS — G309 Alzheimer's disease, unspecified: Secondary | ICD-10-CM | POA: Diagnosis not present

## 2017-04-01 DIAGNOSIS — I10 Essential (primary) hypertension: Secondary | ICD-10-CM | POA: Diagnosis not present

## 2017-04-01 NOTE — ED Triage Notes (Signed)
Per EMS-unwitnessed fall-was found on floor by nursing staff-patient states she was trying to get clothes out of her dresser-denies LOC, no deformities--unsure it she hit head-abrasion to right little finger-staff states she is her baseline mentally-

## 2017-04-01 NOTE — Care Management Note (Signed)
Case Management Note  Pt from [place], active with [facility] PT/OT only.

## 2017-04-01 NOTE — ED Notes (Signed)
[facility] called for transport.  

## 2017-04-01 NOTE — ED Notes (Signed)
Patient transported to CT 

## 2017-04-01 NOTE — Discharge Instructions (Signed)
Please return without fail for worsening symptoms, including confusion, escalating pain, or any other symptoms concerning to you.  Your CT head, neck and X-ray of the hips does not show new injury.

## 2017-04-01 NOTE — ED Notes (Signed)
Bed: [facility] Expected date:  Expected time:  Means of arrival:  Comments: 

## 2017-04-01 NOTE — ED Provider Notes (Signed)
[facility] DEPT Provider Note   CSN: [id number] Arrival date & time: 04/01/17  1240     History   Chief Complaint Chief Complaint  Patient presents with  . Fall    HPI Kathryn Whitaker is a 83 y.o. female.  The history is provided by the patient.  Fall  This is a new problem. The current episode started 1 to 2 hours ago. The problem occurs rarely. The problem has been resolved. Pertinent negatives include no chest pain, no abdominal pain, no headaches and no shortness of breath. Nothing aggravates the symptoms. Nothing relieves the symptoms. She has tried nothing for the symptoms.   83 year old female who presents after fall. She is not anticoagulated. History of alzheimer's dementia, from a nursing home. Patient had an unwitnessed fall today. She states that she was trying to get her close out of her dresser, was unsure quite how she fell, but does not think she probably lost balance. She does report hitting her head but denies any syncope, near syncope, or loss of consciousness. Has an abrasion to the right pinky. Complains of mild left hip pain where she has had previous hip fracture. States that she is normally ambulatory with walker. Denies any neck pain, back pain, chest pain or difficulty breathing, recent illness, fevers, nausea or vomiting, headache, abdominal pain.   Past Medical History:  Diagnosis Date  . Acute asthmatic bronchitis   . Allergic rhinitis   . Alzheimer's dementia   . Anxiety   . Chronic constipation   . Diverticulosis of colon   . DJD (degenerative joint disease)   . GERD (gastroesophageal reflux disease)   . History of colonic polyps   . Hypercholesteremia   . Hypertension   . IBS (irritable bowel syndrome)   . Lacunar infarction   . Lumbar back pain   . Malnutrition ([place])   . UTI (lower urinary tract infection)   . Vitamin D deficiency     Patient Active Problem List   Diagnosis Date Noted  . Acute blood loss anemia 01/15/2017  . Hip  fracture, left ([place]) 01/12/2017  . Hip fracture ([place]) 01/12/2017  . S/p left hip fracture 01/12/2017  . Dementia 07/26/2016  . Dysuria 08/10/2015  . Hypothyroidism 06/06/2015  . Protein-calorie malnutrition, severe ([place]) 06/26/2014  . Constipation 12/17/2013  . COPD (chronic obstructive pulmonary disease) ([place]) 06/03/2013  . IBS (irritable bowel syndrome) 12/30/2012  . Generalized anxiety disorder 12/30/2012  . Dyslipidemia 12/12/2012  . HTN (hypertension) 12/12/2012  . Hx of completed stroke 07/11/2009    Past Surgical History:  Procedure Laterality Date  . ABDOMINAL HYSTERECTOMY    . FEMUR IM NAIL Left 01/13/2017   Procedure: TROCHANTERIC INTRAMEDULLARY (IM) NAIL FEMORAL;  Surgeon: Renette Butters, MD;  Location: [facility];  Service: Orthopedics;  Laterality: Left;    OB History    No data available       Home Medications    Prior to Admission medications   Medication Sig Start Date End Date Taking? Authorizing Provider  albuterol (PROVENTIL HFA) 108 (90 BASE) MCG/ACT inhaler Inhale 2 puffs into the lungs every 6 (six) hours as needed. For shortness of breath 06/27/14   Delfina Redwood, MD  aspirin EC 325 MG tablet Take 1 tablet (325 mg total) by mouth daily. For 30 days post op for DVT Prophylaxis.  Resume 81 mg ASA when completed. 01/14/17   Prudencio Burly III, PA-C  budesonide-formoterol (SYMBICORT) 80-4.5 MCG/ACT inhaler Inhale 2 puffs into  the lungs 2 (two) times daily as needed (wheezing).    [provider]  famotidine (PEPCID) 20 MG tablet Take 1 tablet (20 mg total) by mouth 2 (two) times daily. For 30 days post op for gastroprotection. 01/14/17 01/14/18  Prudencio Burly III, PA-C  feeding supplement (BOOST / RESOURCE BREEZE) LIQD Take 1 Container by mouth 3 (three) times daily between meals. 01/15/17   Janece Canterbury, MD  ferrous sulfate 325 (65 FE) MG tablet Take 1 tablet (325 mg total) by mouth daily. 01/15/17 02/14/17  Janece Canterbury, MD    levothyroxine (SYNTHROID, LEVOTHROID) 50 MCG tablet TAKE 1 TABLET (50 MCG TOTAL) BY MOUTH DAILY BEFORE BREAKFAST. 01/06/17   Golden Circle, FNP  Multiple Vitamins-Minerals (MULTIVITAMIN WITH MINERALS) tablet Take 1 tablet by mouth daily.    [provider]  polyethylene glycol (MIRALAX / GLYCOLAX) packet Take 17 g by mouth daily as needed for mild constipation. 01/15/17   Janece Canterbury, MD  traMADol (ULTRAM) 50 MG tablet Take 1 tablet (50 mg total) by mouth 2 (two) times daily as needed for moderate pain. 01/14/17   Prudencio Burly III, PA-C    Family History Family History  Problem Relation Age of Onset  . Hypertension Other     Social History Social History  Substance Use Topics  . Smoking status: Never Smoker  . Smokeless tobacco: Never Used  . Alcohol use No     Allergies   Demerol [meperidine]; Influenza vaccines; Morphine and related; Peanuts [peanut oil]; Prevpac [amoxicill-clarithro-lansopraz]; and Telithromycin   Review of Systems Review of Systems  Constitutional: Negative for fever.  Respiratory: Negative for shortness of breath.   Cardiovascular: Negative for chest pain.  Gastrointestinal: Negative for abdominal pain.  Neurological: Negative for headaches.  Hematological: Does not bruise/bleed easily.  All other systems reviewed and are negative.    Physical Exam Updated Vital Signs BP (!) 184/95   Pulse 90   Temp 97.9 F (36.6 C) (Oral)   Resp 18   SpO2 100%   Physical Exam Physical Exam  Nursing note and vitals reviewed. Constitutional: Well developed, well nourished, non-toxic, and in no acute distress Head: Normocephalic and atraumatic.  Mouth/Throat: Oropharynx is clear and moist.  Neck: Normal range of motion. Neck supple. no cervical spine tenderness Cardiovascular: Normal rate and regular rhythm.   Pulmonary/Chest: Effort normal and breath sounds normal.  Abdominal: Soft. There is no tenderness. There is no rebound and  no guarding.  Musculoskeletal: No deformities. Limited ROM of the left hip due to pain. No TLS tenderness Neurological: Alert, no facial droop, fluent speech, moves all extremities symmetrically, sensation to light touch in tact throughout Skin: Skin is warm and dry.  Psychiatric: Cooperative   ED Treatments / Results  Labs (all labs ordered are listed, but only abnormal results are displayed) Labs Reviewed - No data to display  EKG  EKG Interpretation None       Radiology Ct Head Wo Contrast  Result Date: 04/01/2017 CLINICAL DATA:  Status post fall. She is not anticoagulated. History of alzheimer's dementia, from a nursing home. Patient had an unwitnessed fall today. EXAM: CT HEAD WITHOUT CONTRAST CT CERVICAL SPINE WITHOUT CONTRAST TECHNIQUE: Multidetector CT imaging of the head and cervical spine was performed following the standard protocol without intravenous contrast. Multiplanar CT image reconstructions of the cervical spine were also generated. COMPARISON:  01/13/2017 FINDINGS: CT HEAD FINDINGS Brain: No evidence of acute infarction, hemorrhage, extra-axial collection, ventriculomegaly, or mass effect. Generalized cerebral atrophy.  Periventricular white matter low attenuation likely secondary to microangiopathy. Vascular: Cerebrovascular atherosclerotic calcifications are noted. Skull: Negative for fracture or focal lesion. Sinuses/Orbits: Visualized portions of the orbits are unremarkable. Visualized portions of the paranasal sinuses and mastoid air cells are unremarkable. Other: None. CT CERVICAL SPINE FINDINGS Alignment: Normal. Skull base and vertebrae: No acute fracture. No primary bone lesion or focal pathologic process. Soft tissues and spinal canal: No prevertebral fluid or swelling. No visible canal hematoma. Disc levels: Degenerative disc disease with disc height loss at C2-3, C3-4, C4-5, C5-6. Bilateral facet arthropathy throughout the cervical spine. No foraminal or central  canal stenosis. Upper chest: Mild biapical scarring. Other: No fluid collection or hematoma. Bilateral carotid artery atherosclerosis. IMPRESSION: 1. No acute intracranial pathology. 2. No acute osseous injury of the cervical spine. Electronically Signed   By: Kathreen Devoid   On: 04/01/2017 14:24   Ct Cervical Spine Wo Contrast  Result Date: 04/01/2017 CLINICAL DATA:  Status post fall. She is not anticoagulated. History of alzheimer's dementia, from a nursing home. Patient had an unwitnessed fall today. EXAM: CT HEAD WITHOUT CONTRAST CT CERVICAL SPINE WITHOUT CONTRAST TECHNIQUE: Multidetector CT imaging of the head and cervical spine was performed following the standard protocol without intravenous contrast. Multiplanar CT image reconstructions of the cervical spine were also generated. COMPARISON:  01/13/2017 FINDINGS: CT HEAD FINDINGS Brain: No evidence of acute infarction, hemorrhage, extra-axial collection, ventriculomegaly, or mass effect. Generalized cerebral atrophy. Periventricular white matter low attenuation likely secondary to microangiopathy. Vascular: Cerebrovascular atherosclerotic calcifications are noted. Skull: Negative for fracture or focal lesion. Sinuses/Orbits: Visualized portions of the orbits are unremarkable. Visualized portions of the paranasal sinuses and mastoid air cells are unremarkable. Other: None. CT CERVICAL SPINE FINDINGS Alignment: Normal. Skull base and vertebrae: No acute fracture. No primary bone lesion or focal pathologic process. Soft tissues and spinal canal: No prevertebral fluid or swelling. No visible canal hematoma. Disc levels: Degenerative disc disease with disc height loss at C2-3, C3-4, C4-5, C5-6. Bilateral facet arthropathy throughout the cervical spine. No foraminal or central canal stenosis. Upper chest: Mild biapical scarring. Other: No fluid collection or hematoma. Bilateral carotid artery atherosclerosis. IMPRESSION: 1. No acute intracranial pathology. 2. No  acute osseous injury of the cervical spine. Electronically Signed   By: Kathreen Devoid   On: 04/01/2017 14:24   Dg Hips Bilat With Pelvis 3-4 Views  Result Date: 04/01/2017 CLINICAL DATA:  Bilateral hip pain after or unwitnessed fall today. EXAM: DG HIP (WITH OR WITHOUT PELVIS) 3-4V BILAT COMPARISON:  None. FINDINGS: No evidence of acute hip fracture or dislocation. Remote left proximal femur fracture repair using femoral nail and dynamic hip screw. No evidence of pelvic ring fracture, but limited by degree of overlapping stool and gas. There is a prominent volume of retained stool, distending the rectum to nearly 11 cm. Osteopenia. IMPRESSION: 1. No acute finding. 2. Prominent stool retention with rectal distention. Electronically Signed   By: Monte Fantasia M.D.   On: 04/01/2017 14:36    Procedures Procedures (including critical care time)  Medications Ordered in ED Medications - No data to display   Initial Impression / Assessment and Plan / ED Course  I have reviewed the triage vital signs and the nursing notes.  Pertinent labs & imaging results that were available during my care of the patient were reviewed by me and considered in my medical decision making (see chart for details).     Spoke with family. She is primarily wheelchair bound, and able  to walk short distances with heavy assistance. Likely she was standing up unsupervised to reach her dresser, which sits much higher up and requires her to stand to reach it, then lost balance and fell.  She is able to walk with assistance, which is her baseline in the ED. Mentation baseline by report as well.  CT head and cervical spine w/o acute injuries. X-ray of hips with no acute findings.   Felt stable for discharge back to facility. Strict return and follow-up instructions reviewed. She and family expressed understanding of all discharge instructions and felt comfortable with the plan of care.   Final Clinical Impressions(s) / ED  Diagnoses   Final diagnoses:  Fall, initial encounter    New Prescriptions New Prescriptions   No medications on file     Forde Dandy, MD 04/01/17 1652

## 2017-04-01 NOTE — ED Notes (Signed)
[facility] CALL FOR PT TRANSPORT.

## 2017-04-06 ENCOUNTER — Emergency Department (HOSPITAL_COMMUNITY)
Admission: EM | Admit: 2017-04-06 | Discharge: 2017-04-08 | Disposition: A | Discharge status: Home / Self Care | Payer: Medicare Other | Attending: Emergency Medicine | Admitting: Emergency Medicine

## 2017-04-06 ENCOUNTER — Encounter (HOSPITAL_COMMUNITY): Payer: Self-pay

## 2017-04-06 DIAGNOSIS — F0391 Unspecified dementia with behavioral disturbance: Secondary | ICD-10-CM | POA: Insufficient documentation

## 2017-04-06 DIAGNOSIS — E039 Hypothyroidism, unspecified: Secondary | ICD-10-CM | POA: Diagnosis not present

## 2017-04-06 DIAGNOSIS — R443 Hallucinations, unspecified: Secondary | ICD-10-CM | POA: Diagnosis not present

## 2017-04-06 DIAGNOSIS — J449 Chronic obstructive pulmonary disease, unspecified: Secondary | ICD-10-CM | POA: Insufficient documentation

## 2017-04-06 DIAGNOSIS — Z7982 Long term (current) use of aspirin: Secondary | ICD-10-CM | POA: Diagnosis not present

## 2017-04-06 DIAGNOSIS — I10 Essential (primary) hypertension: Secondary | ICD-10-CM | POA: Diagnosis not present

## 2017-04-06 DIAGNOSIS — Z79899 Other long term (current) drug therapy: Secondary | ICD-10-CM | POA: Insufficient documentation

## 2017-04-06 DIAGNOSIS — Z01818 Encounter for other preprocedural examination: Secondary | ICD-10-CM

## 2017-04-06 DIAGNOSIS — Z91010 Allergy to peanuts: Secondary | ICD-10-CM | POA: Insufficient documentation

## 2017-04-06 DIAGNOSIS — R450 Nervousness: Secondary | ICD-10-CM | POA: Diagnosis not present

## 2017-04-06 DIAGNOSIS — F0392 Unspecified dementia, unspecified severity, with psychotic disturbance: Secondary | ICD-10-CM

## 2017-04-06 DIAGNOSIS — R4587 Impulsiveness: Secondary | ICD-10-CM | POA: Diagnosis not present

## 2017-04-06 DIAGNOSIS — F22 Delusional disorders: Secondary | ICD-10-CM | POA: Diagnosis not present

## 2017-04-06 LAB — COMPREHENSIVE METABOLIC PANEL
ALBUMIN: 3.1 g/dL — AB (ref 3.5–5.0)
ALK PHOS: 72 U/L (ref 38–126)
ALT: 15 U/L (ref 14–54)
AST: 22 U/L (ref 15–41)
Anion gap: 7 (ref 5–15)
BUN: 14 mg/dL (ref 6–20)
CO2: 27 mmol/L (ref 22–32)
CREATININE: 0.48 mg/dL (ref 0.44–1.00)
Calcium: 9.5 mg/dL (ref 8.9–10.3)
Chloride: 104 mmol/L (ref 101–111)
GFR calc Af Amer: >60 mL/min (ref >60)
GFR calc non Af Amer: >60 mL/min (ref >60)
GLUCOSE: 80 mg/dL (ref 65–99)
Potassium: 4.5 mmol/L (ref 3.5–5.1)
SODIUM: 138 mmol/L (ref 135–145)
Total Bilirubin: 0.5 mg/dL (ref 0.3–1.2)
Total Protein: 6.6 g/dL (ref 6.5–8.1)

## 2017-04-06 LAB — URINALYSIS, ROUTINE W REFLEX MICROSCOPIC
BILIRUBIN URINE: NEGATIVE
Glucose, UA: NEGATIVE mg/dL
KETONES UR: NEGATIVE mg/dL
LEUKOCYTES UA: NEGATIVE
NITRITE: NEGATIVE
PH: 7 (ref 5.0–8.0)
Protein, ur: NEGATIVE mg/dL
SPECIFIC GRAVITY, URINE: 1.008 (ref 1.005–1.030)
Squamous Epithelial / LPF: NONE SEEN

## 2017-04-06 LAB — CBC WITH DIFFERENTIAL/PLATELET
BASOS PCT: 1 %
Basophils Absolute: 0 10*3/uL (ref 0.0–0.1)
Eosinophils Absolute: 0.1 10*3/uL (ref 0.0–0.7)
Eosinophils Relative: 3 %
HEMATOCRIT: 35.2 % — AB (ref 36.0–46.0)
HEMOGLOBIN: 11.3 g/dL — AB (ref 12.0–15.0)
LYMPHS ABS: 1.3 10*3/uL (ref 0.7–4.0)
LYMPHS PCT: 27 %
MCH: 29.4 pg (ref 26.0–34.0)
MCHC: 32.1 g/dL (ref 30.0–36.0)
MCV: 91.7 fL (ref 78.0–100.0)
MONO ABS: 0.6 10*3/uL (ref 0.1–1.0)
MONOS PCT: 12 %
NEUTROS ABS: 2.8 10*3/uL (ref 1.7–7.7)
NEUTROS PCT: 57 %
Platelets: 257 10*3/uL (ref 150–400)
RBC: 3.84 MIL/uL — ABNORMAL LOW (ref 3.87–5.11)
RDW: 14.2 % (ref 11.5–15.5)
WBC: 4.8 10*3/uL (ref 4.0–10.5)

## 2017-04-06 LAB — RAPID URINE DRUG SCREEN, HOSP PERFORMED
Amphetamines: NOT DETECTED
BARBITURATES: NOT DETECTED
Benzodiazepines: NOT DETECTED
Cocaine: NOT DETECTED
Opiates: NOT DETECTED
TETRAHYDROCANNABINOL: NOT DETECTED

## 2017-04-06 LAB — ETHANOL

## 2017-04-06 MED ORDER — ASPIRIN 81 MG PO CHEW
81.0000 mg | CHEWABLE_TABLET | Freq: Every day | ORAL | Status: DC
  Administered 2017-04-07 – 2017-04-08 (×2): 81 mg via ORAL
  Filled 2017-04-06 (×2): qty 1

## 2017-04-06 MED ORDER — SODIUM CHLORIDE 0.9 % IV SOLN
Freq: Once | INTRAVENOUS | Status: AC
  Administered 2017-04-06: 22:00:00 via INTRAVENOUS

## 2017-04-06 MED ORDER — LORAZEPAM 0.5 MG PO TABS
0.5000 mg | ORAL_TABLET | Freq: Two times a day (BID) | ORAL | Status: DC | PRN
  Administered 2017-04-07: 0.5 mg via ORAL
  Filled 2017-04-06: qty 1

## 2017-04-06 MED ORDER — FAMOTIDINE 20 MG PO TABS
20.0000 mg | ORAL_TABLET | Freq: Two times a day (BID) | ORAL | Status: DC
  Administered 2017-04-07 – 2017-04-08 (×4): 20 mg via ORAL
  Filled 2017-04-06 (×4): qty 1

## 2017-04-06 MED ORDER — LAMOTRIGINE 25 MG PO TABS
75.0000 mg | ORAL_TABLET | Freq: Two times a day (BID) | ORAL | Status: DC
  Administered 2017-04-07 – 2017-04-08 (×4): 75 mg via ORAL
  Filled 2017-04-06 (×4): qty 3

## 2017-04-06 MED ORDER — LORAZEPAM 0.5 MG PO TABS
0.5000 mg | ORAL_TABLET | Freq: Once | ORAL | Status: AC
  Administered 2017-04-06: 0.5 mg via ORAL
  Filled 2017-04-06: qty 1

## 2017-04-06 NOTE — ED Notes (Signed)
IV start attempt unsucessful

## 2017-04-06 NOTE — ED Triage Notes (Addendum)
Patient is a resident of [facility]. Patient's son reports that the ECf instructed the son to bring the patient to the hospital.son reports increased confusion and the patient  Was having hallucinations which includes seeing snakes and not sleeping x 2 days. Son states the staff reported that the patient was uncontrollable. Patient's son states the patient has been fighting the staff and tried to break the window.

## 2017-04-06 NOTE — ED Provider Notes (Signed)
[facility] DEPT Provider Note   CSN: [id number] Arrival date & time: 04/06/17  1608     History   Chief Complaint Chief Complaint  Patient presents with  . Hallucinations    HPI Kathryn Whitaker is a 83 y.o. female.  HPI Patient presents with 2 days of increased confusion and hallucinations. She is seeing snakes and thinks that they are biting her. Has not slept since yesterday. She's been treated with Ativan in the nursing home without improvement. Evaluated 5 days ago after fall. CT head and cervical spine without acute findings. Has had similar symptoms in the past with UTI. Past Medical History:  Diagnosis Date  . Acute asthmatic bronchitis   . Allergic rhinitis   . Alzheimer's dementia   . Anxiety   . Chronic constipation   . Diverticulosis of colon   . DJD (degenerative joint disease)   . GERD (gastroesophageal reflux disease)   . History of colonic polyps   . Hypercholesteremia   . Hypertension   . IBS (irritable bowel syndrome)   . Lacunar infarction   . Lumbar back pain   . Malnutrition ([place])   . UTI (lower urinary tract infection)   . Vitamin D deficiency     Patient Active Problem List   Diagnosis Date Noted  . Acute blood loss anemia 01/15/2017  . Hip fracture, left ([place]) 01/12/2017  . Hip fracture ([place]) 01/12/2017  . S/p left hip fracture 01/12/2017  . Dementia with psychosis 07/26/2016  . Dysuria 08/10/2015  . Hypothyroidism 06/06/2015  . Protein-calorie malnutrition, severe ([place]) 06/26/2014  . Constipation 12/17/2013  . COPD (chronic obstructive pulmonary disease) ([place]) 06/03/2013  . IBS (irritable bowel syndrome) 12/30/2012  . Generalized anxiety disorder 12/30/2012  . Dyslipidemia 12/12/2012  . HTN (hypertension) 12/12/2012  . Hx of completed stroke 07/11/2009    Past Surgical History:  Procedure Laterality Date  . ABDOMINAL HYSTERECTOMY    . FEMUR IM NAIL Left 01/13/2017   Procedure: TROCHANTERIC INTRAMEDULLARY (IM) NAIL FEMORAL;   Surgeon: Renette Butters, MD;  Location: [facility];  Service: Orthopedics;  Laterality: Left;    OB History    No data available       Home Medications    Prior to Admission medications   Medication Sig Start Date End Date Taking? Authorizing Provider  albuterol (PROVENTIL HFA) 108 (90 BASE) MCG/ACT inhaler Inhale 2 puffs into the lungs every 6 (six) hours as needed. For shortness of breath 06/27/14  Yes Delfina Redwood, MD  aspirin 81 MG chewable tablet Chew 81 mg by mouth daily.   Yes [provider]  budesonide-formoterol (SYMBICORT) 80-4.5 MCG/ACT inhaler Inhale 2 puffs into the lungs 2 (two) times daily as needed (wheezing).   Yes [provider]  cyanocobalamin 500 MCG tablet Take 500 mcg by mouth daily.   Yes [provider]  famotidine (PEPCID) 20 MG tablet Take 1 tablet (20 mg total) by mouth 2 (two) times daily. For 30 days post op for gastroprotection. 01/14/17 01/14/18 Yes Martensen, Charna Elizabeth III, PA-C  feeding supplement (BOOST / RESOURCE BREEZE) LIQD Take 1 Container by mouth 3 (three) times daily between meals. 01/15/17  Yes Short, Noah Delaine, MD  ferrous sulfate 325 (65 FE) MG tablet Take 1 tablet (325 mg total) by mouth daily. 01/15/17 04/06/17 Yes Short, Noah Delaine, MD  lamoTRIgine (LAMICTAL) 25 MG tablet Take 75 mg by mouth 2 (two) times daily.   Yes [provider]  levothyroxine (SYNTHROID, LEVOTHROID) 50 MCG tablet TAKE 1  TABLET (50 MCG TOTAL) BY MOUTH DAILY BEFORE BREAKFAST. 01/06/17  Yes Golden Circle, FNP  LORazepam (ATIVAN) 0.5 MG tablet Take 0.5 mg by mouth 2 (two) times daily as needed for anxiety.   Yes [provider]  Multiple Vitamins-Minerals (MULTIVITAMIN WITH MINERALS) tablet Take 1 tablet by mouth daily.   Yes [provider]  PRESCRIPTION MEDICATION Apply 1 mg topically every 6 (six) hours as needed (agitation). Lorazepam Gel 1mg /mL   Yes [provider]  traMADol (ULTRAM) 50 MG tablet Take 1  tablet (50 mg total) by mouth 2 (two) times daily as needed for moderate pain. 01/14/17  Yes Prudencio Burly III, PA-C  aspirin EC 325 MG tablet Take 1 tablet (325 mg total) by mouth daily. For 30 days post op for DVT Prophylaxis.  Resume 81 mg ASA when completed. Patient not taking: Reported on 04/06/2017 01/14/17   Prudencio Burly III, PA-C  polyethylene glycol [facility] / Floria Raveling) packet Take 17 g by mouth daily as needed for mild constipation. Patient not taking: Reported on 04/06/2017 01/15/17   Janece Canterbury, MD    Family History Family History  Problem Relation Age of Onset  . Hypertension Other     Social History Social History  Substance Use Topics  . Smoking status: Never Smoker  . Smokeless tobacco: Never Used  . Alcohol use No     Allergies   Demerol [meperidine]; Influenza vaccines; Morphine and related; Peanuts [peanut oil]; Prevpac [amoxicill-clarithro-lansopraz]; and Telithromycin   Review of Systems Review of Systems  Constitutional: Negative for fever.  Respiratory: Negative for shortness of breath.   Cardiovascular: Negative for chest pain.  Gastrointestinal: Negative for abdominal pain, diarrhea and vomiting.  Genitourinary: Negative for flank pain.  Musculoskeletal: Negative for back pain and neck pain.  Skin: Negative for rash and wound.  Neurological: Negative for weakness, numbness and headaches.  Psychiatric/Behavioral: Positive for agitation, confusion, hallucinations and sleep disturbance.  All other systems reviewed and are negative.    Physical Exam Updated Vital Signs BP (!) 175/92 (BP Location: Left Arm)   Pulse 74   Temp 98.2 F (36.8 C) (Oral)   Resp 18   SpO2 96%   Physical Exam  Constitutional: She appears well-developed and well-nourished. No distress.  HENT:  Head: Normocephalic and atraumatic.  Mouth/Throat: Oropharynx is clear and moist. No oropharyngeal exudate.  Eyes: Pupils are equal, round, and reactive  to light. EOM are normal.  Neck: Normal range of motion. Neck supple.  No posterior midline cervical tenderness to palpation. No meningismus  Cardiovascular: Normal rate and regular rhythm.  Exam reveals no gallop and no friction rub.   No murmur heard. Pulmonary/Chest: Effort normal and breath sounds normal. No respiratory distress. She has no wheezes. She has no rales. She exhibits no tenderness.  Abdominal: Soft. Bowel sounds are normal. There is no tenderness. There is no rebound and no guarding.  Musculoskeletal: Normal range of motion. She exhibits no edema or tenderness.  Neurological: She is alert.  Oriented to person and place. Moves all extremities without deficit. Sensation intact.  Skin: Skin is warm and dry. No rash noted. She is not diaphoretic. No erythema.  Psychiatric:  Confused and mildly agitated but easily redirected. Active hallucinations.   Nursing note and vitals reviewed.    ED Treatments / Results  Labs (all labs ordered are listed, but only abnormal results are displayed) Labs Reviewed  CBC WITH DIFFERENTIAL/PLATELET - Abnormal; Notable for the following:  Result Value   RBC 3.84 (*)    Hemoglobin 11.3 (*)    HCT 35.2 (*)    All other components within normal limits  COMPREHENSIVE METABOLIC PANEL - Abnormal; Notable for the following:    Albumin 3.1 (*)    All other components within normal limits  T4, FREE - Abnormal; Notable for the following:    Free T4 1.31 (*)    All other components within normal limits  URINALYSIS, ROUTINE W REFLEX MICROSCOPIC - Abnormal; Notable for the following:    Hgb urine dipstick SMALL (*)    Bacteria, UA RARE (*)    All other components within normal limits  RAPID URINE DRUG SCREEN, HOSP PERFORMED  ETHANOL  TSH    EKG  EKG Interpretation  Date/Time:  Monday April 07 2017 12:44:20 EDT Ventricular Rate:  73 PR Interval:  176 QRS Duration: 108 QT Interval:  402 QTC Calculation: 442 R Axis:   -70 Text  Interpretation:  Normal sinus rhythm Incomplete right bundle branch block Left anterior fascicular block T wave abnormality, consider lateral ischemia Abnormal ECG When compared with ECG of 01/12/2017, No significant change was found Confirmed by Delora Fuel ([id number]) on 04/07/2017 5:11:09 PM       Radiology Dg Chest 2 View  Result Date: 04/07/2017 CLINICAL DATA:  83 year old female pre admission. History of dementia. Initial encounter. EXAM: CHEST  2 VIEW COMPARISON:  01/12/2017. FINDINGS: No infiltrate, congestive heart failure or pneumothorax. Cardiomegaly. Calcified tortuous aorta. No acute osseous abnormality IMPRESSION: No infiltrate or congestive heart failure. Cardiomegaly Calcified tortuous aorta Aortic Atherosclerosis (ICD10-I70.0). Electronically Signed   By: Genia Del M.D.   On: 04/07/2017 12:33    Procedures Procedures (including critical care time)  Medications Ordered in ED Medications  aspirin chewable tablet 81 mg (81 mg Oral Given 04/07/17 1022)  famotidine (PEPCID) tablet 20 mg (20 mg Oral Given 04/07/17 2148)  lamoTRIgine (LAMICTAL) tablet 75 mg (75 mg Oral Given 04/07/17 2148)  LORazepam (ATIVAN) tablet 0.5 mg (0.5 mg Oral Given 04/07/17 1022)  levothyroxine (SYNTHROID, LEVOTHROID) tablet 50 mcg (50 mcg Oral Not Given 04/07/17 1516)  albuterol (PROVENTIL HFA;VENTOLIN HFA) 108 (90 Base) MCG/ACT inhaler 2 puff (not administered)  haloperidol (HALDOL) tablet 0.5 mg (0.5 mg Oral Given 04/07/17 2148)  LORazepam (ATIVAN) tablet 0.5 mg (0.5 mg Oral Given 04/06/17 1908)  0.9 %  sodium chloride infusion ( Intravenous Stopped 04/06/17 2246)     Initial Impression / Assessment and Plan / ED Course  I have reviewed the triage vital signs and the nursing notes.  Pertinent labs & imaging results that were available during my care of the patient were reviewed by me and considered in my medical decision making (see chart for details).     Delay in obtaining blood draw due  to difficult stick. UA and UDS without acute findings. She is comfortable-appearing and vital signs are stable. TTS will need to be consulted. Signed out to oncoming emergency physician pending laboratory results.  Final Clinical Impressions(s) / ED Diagnoses   Final diagnoses:  Hallucinations    New Prescriptions New Prescriptions   No medications on file     Julianne Rice, MD 04/07/17 2327

## 2017-04-06 NOTE — ED Notes (Signed)
ED Provider at bedside. 

## 2017-04-06 NOTE — ED Notes (Signed)
This Nurse attempted to draw blood for lab twice  ,unsuccessful ,unable to collect enough amount for test. To notify MD.IV Team able to start IV but unable to draw blood.

## 2017-04-07 ENCOUNTER — Telehealth: Payer: Self-pay | Admitting: Internal Medicine

## 2017-04-07 ENCOUNTER — Emergency Department (HOSPITAL_COMMUNITY): Payer: Medicare Other

## 2017-04-07 DIAGNOSIS — R450 Nervousness: Secondary | ICD-10-CM

## 2017-04-07 DIAGNOSIS — R4587 Impulsiveness: Secondary | ICD-10-CM | POA: Diagnosis not present

## 2017-04-07 DIAGNOSIS — F22 Delusional disorders: Secondary | ICD-10-CM | POA: Diagnosis not present

## 2017-04-07 DIAGNOSIS — R443 Hallucinations, unspecified: Secondary | ICD-10-CM | POA: Diagnosis not present

## 2017-04-07 DIAGNOSIS — F0391 Unspecified dementia with behavioral disturbance: Secondary | ICD-10-CM | POA: Diagnosis not present

## 2017-04-07 LAB — TSH: TSH: 3.635 u[IU]/mL (ref 0.350–4.500)

## 2017-04-07 LAB — T4, FREE: FREE T4: 1.31 ng/dL — AB (ref 0.61–1.12)

## 2017-04-07 MED ORDER — HALOPERIDOL 1 MG PO TABS
0.5000 mg | ORAL_TABLET | Freq: Two times a day (BID) | ORAL | Status: DC
  Administered 2017-04-07: 1 mg via ORAL
  Administered 2017-04-07 – 2017-04-08 (×2): 0.5 mg via ORAL
  Filled 2017-04-07 (×3): qty 1

## 2017-04-07 MED ORDER — LEVOTHYROXINE SODIUM 50 MCG PO TABS
50.0000 ug | ORAL_TABLET | Freq: Every day | ORAL | Status: DC
  Administered 2017-04-08: 50 ug via ORAL
  Filled 2017-04-07 (×3): qty 1

## 2017-04-07 MED ORDER — ALBUTEROL SULFATE HFA 108 (90 BASE) MCG/ACT IN AERS: 2.0000 | INHALATION_SPRAY | Freq: Four times a day (QID) | RESPIRATORY_TRACT | Status: DC | PRN

## 2017-04-07 NOTE — Consult Note (Signed)
[facility] Consult   Reason for Consult: confusion and psychosis Referring Physician: EDP Patient Identification: Kathryn Whitaker MRN:  [id number] Principal Diagnosis: Dementia with psychosis Diagnosis:   Patient Active Problem List   Diagnosis Date Noted  . Acute blood loss anemia [D62] 01/15/2017  . Hip fracture, left ([place]) [S72.002A] 01/12/2017  . Hip fracture ([place]) [S72.009A] 01/12/2017  . S/p left hip fracture [Z87.81] 01/12/2017  . Dementia with psychosis [F03.91] 07/26/2016  . Dysuria [R30.0] 08/10/2015  . Hypothyroidism [E03.9] 06/06/2015  . Protein-calorie malnutrition, severe ([place]) [E43] 06/26/2014  . Constipation [K59.00] 12/17/2013  . COPD (chronic obstructive pulmonary disease) ([place]) [J44.9] 06/03/2013  . IBS (irritable bowel syndrome) [K58.9] 12/30/2012  . Generalized anxiety disorder [F41.1] 12/30/2012  . Dyslipidemia [E78.5] 12/12/2012  . HTN (hypertension) [I10] 12/12/2012  . Hx of completed stroke [Z86.73] 07/11/2009    Total Time spent with patient: 45 minutes  Subjective:   Kathryn Whitaker is a 83 y.o. female patient admitted due to increased confusion and hallucinations.  HPI:  Patient with history of Dementia and multiple medical problem who is a poor historian but her family reports that patient has been having increasing confusion and told them about snakes  crawling on her skin. Patient also reports having difficulty getting along with the staff at her nursing home because she is afraid they are going to feed her to snakes. Today, patient is calm, cooperative but she is still paranoid.  Past Psychiatric History: as above  Risk to Self: Suicidal Ideation: No (NONE REPORTED IN TRIAGE PER CHART) Suicidal Intent: No Is patient at risk for suicide?: No Suicidal Plan?: No Access to Means: No What has been your use of drugs/alcohol within the last 12 months?: UTA, LABS PENDING  Triggers for Past Attempts: Unknown Intentional Self  Injurious Behavior:  (UTA) Risk to Others: Homicidal Ideation: No (NONE REPORTED IN TRIAGE PER CHART) Thoughts of Harm to Others: No Current Homicidal Intent: No Current Homicidal Plan: No Access to Homicidal Means: No History of harm to others?: No Assessment of Violence: None Noted Does patient have access to weapons?: No Criminal Charges Pending?: No Does patient have a court date: No Prior Inpatient Therapy: Prior Inpatient Therapy: No Prior Outpatient Therapy: Prior Outpatient Therapy:  (UNKNOWN) Does patient have an ACCT team?: No Does patient have Intensive In-House Services?  : No Does patient have [facility] services? : No Does patient have P4CC services?: No  Past Medical History:  Past Medical History:  Diagnosis Date  . Acute asthmatic bronchitis   . Allergic rhinitis   . Alzheimer's dementia   . Anxiety   . Chronic constipation   . Diverticulosis of colon   . DJD (degenerative joint disease)   . GERD (gastroesophageal reflux disease)   . History of colonic polyps   . Hypercholesteremia   . Hypertension   . IBS (irritable bowel syndrome)   . Lacunar infarction   . Lumbar back pain   . Malnutrition ([place])   . UTI (lower urinary tract infection)   . Vitamin D deficiency     Past Surgical History:  Procedure Laterality Date  . ABDOMINAL HYSTERECTOMY    . FEMUR IM NAIL Left 01/13/2017   Procedure: TROCHANTERIC INTRAMEDULLARY (IM) NAIL FEMORAL;  Surgeon: Renette Butters, MD;  Location: [facility];  Service: Orthopedics;  Laterality: Left;   Family History:  Family History  Problem Relation Age of Onset  . Hypertension Other    Family Psychiatric  History:   Social History:  History  Alcohol Use No     History  Drug Use No    Social History   Social History  . Marital status: Widowed    Spouse name: N/A  . Number of children: 6  . Years of education: N/A   Social History Main Topics  . Smoking status: Never Smoker  . Smokeless tobacco: Never Used  .  Alcohol use No  . Drug use: No  . Sexual activity: Not Asked   Other Topics Concern  . None   Social History Narrative  . None   Additional Social History:    Allergies:   Allergies  Allergen Reactions  . Demerol [Meperidine]     Pt states she can't remember type of reaction  . Influenza Vaccines     Pt can't remember reaction but states allergic and has not had in 25 years  . Morphine And Related Dermatitis  . Peanuts [Peanut Oil]     Pt reports she is allergic to peanuts, but was asking to eat peanut butter. Pt unsure of her reaction.   [name] [Amoxicill-Clarithro-Lansopraz] Hives    .Marland KitchenHas patient had a PCN reaction causing immediate rash, facial/tongue/throat swelling, SOB or lightheadedness with hypotension: unknown Has patient had a PCN reaction causing severe rash involving mucus membranes or skin necrosis: unknown Has patient had a PCN reaction that required hospitalization unknown Has patient had a PCN reaction occurring within the last 10 years: unknown If all of the above answers are "NO", then may proceed with Cephalosporin use.   . Telithromycin Hives    REACTION: pt states HIVES....    Labs:  Results for orders placed or performed during the hospital encounter of 04/06/17 (from the past 48 hour(s))  Rapid urine drug screen (hospital performed)     Status: None   Collection Time: 04/06/17  8:57 PM  Result Value Ref Range   Opiates NONE DETECTED NONE DETECTED   Cocaine NONE DETECTED NONE DETECTED   Benzodiazepines NONE DETECTED NONE DETECTED   Amphetamines NONE DETECTED NONE DETECTED   Tetrahydrocannabinol NONE DETECTED NONE DETECTED   Barbiturates NONE DETECTED NONE DETECTED    Comment:        DRUG SCREEN FOR MEDICAL PURPOSES ONLY.  IF CONFIRMATION IS NEEDED FOR ANY PURPOSE, NOTIFY LAB WITHIN 5 DAYS.        LOWEST DETECTABLE LIMITS FOR URINE DRUG SCREEN Drug Class       Cutoff (ng/mL) Amphetamine      1000 Barbiturate      200 Benzodiazepine    993 Tricyclics       570 Opiates          300 Cocaine          300 THC              50   Urinalysis, Routine w reflex microscopic     Status: Abnormal   Collection Time: 04/06/17  9:19 PM  Result Value Ref Range   Color, Urine YELLOW YELLOW   APPearance CLEAR CLEAR   Specific Gravity, Urine 1.008 1.005 - 1.030   pH 7.0 5.0 - 8.0   Glucose, UA NEGATIVE NEGATIVE mg/dL   Hgb urine dipstick SMALL (A) NEGATIVE   Bilirubin Urine NEGATIVE NEGATIVE   Ketones, ur NEGATIVE NEGATIVE mg/dL   Protein, ur NEGATIVE NEGATIVE mg/dL   Nitrite NEGATIVE NEGATIVE   Leukocytes, UA NEGATIVE NEGATIVE   RBC / HPF 0-5 0 - 5 RBC/hpf   WBC, UA 0-5 0 - 5  WBC/hpf   Bacteria, UA RARE (A) NONE SEEN   Squamous Epithelial / LPF NONE SEEN NONE SEEN  CBC with Differential/Platelet     Status: Abnormal   Collection Time: 04/06/17 11:01 PM  Result Value Ref Range   WBC 4.8 4.0 - 10.5 K/uL   RBC 3.84 (L) 3.87 - 5.11 MIL/uL   Hemoglobin 11.3 (L) 12.0 - 15.0 g/dL   HCT 35.2 (L) 36.0 - 46.0 %   MCV 91.7 78.0 - 100.0 fL   MCH 29.4 26.0 - 34.0 pg   MCHC 32.1 30.0 - 36.0 g/dL   RDW 14.2 11.5 - 15.5 %   Platelets 257 150 - 400 K/uL   Neutrophils Relative % 57 %   Neutro Abs 2.8 1.7 - 7.7 K/uL   Lymphocytes Relative 27 %   Lymphs Abs 1.3 0.7 - 4.0 K/uL   Monocytes Relative 12 %   Monocytes Absolute 0.6 0.1 - 1.0 K/uL   Eosinophils Relative 3 %   Eosinophils Absolute 0.1 0.0 - 0.7 K/uL   Basophils Relative 1 %   Basophils Absolute 0.0 0.0 - 0.1 K/uL  Comprehensive metabolic panel     Status: Abnormal   Collection Time: 04/06/17 11:01 PM  Result Value Ref Range   Sodium 138 135 - 145 mmol/L   Potassium 4.5 3.5 - 5.1 mmol/L   Chloride 104 101 - 111 mmol/L   CO2 27 22 - 32 mmol/L   Glucose, Bld 80 65 - 99 mg/dL   BUN 14 6 - 20 mg/dL   Creatinine, Ser 0.48 0.44 - 1.00 mg/dL   Calcium 9.5 8.9 - 10.3 mg/dL   Total Protein 6.6 6.5 - 8.1 g/dL   Albumin 3.1 (L) 3.5 - 5.0 g/dL   AST 22 15 - 41 U/L   ALT 15 14 - 54  U/L   Alkaline Phosphatase 72 38 - 126 U/L   Total Bilirubin 0.5 0.3 - 1.2 mg/dL   GFR calc non Af Amer >60 >60 mL/min   GFR calc Af Amer >60 >60 mL/min    Comment: (NOTE) The eGFR has been calculated using the CKD EPI equation. This calculation has not been validated in all clinical situations. eGFR's persistently <60 mL/min signify possible Chronic Kidney Disease.    Anion gap 7 5 - 15  Ethanol     Status: None   Collection Time: 04/06/17 11:02 PM  Result Value Ref Range   Alcohol, Ethyl (B) <10 <10 mg/dL    Comment:        LOWEST DETECTABLE LIMIT FOR SERUM ALCOHOL IS 10 mg/dL FOR MEDICAL PURPOSES ONLY   TSH     Status: None   Collection Time: 04/06/17 11:02 PM  Result Value Ref Range   TSH 3.635 0.350 - 4.500 uIU/mL    Comment: Performed by a 3rd Generation assay with a functional sensitivity of <=0.01 uIU/mL.    Current Facility-Administered Medications  Medication Dose Route Frequency Provider Last Rate Last Dose  . albuterol (PROVENTIL HFA;VENTOLIN HFA) 108 (90 Base) MCG/ACT inhaler 2 puff  2 puff Inhalation Q6H PRN Jenissa Tyrell, MD      . aspirin chewable tablet 81 mg  81 mg Oral Daily Julianne Rice, MD   81 mg at 04/07/17 1022  . famotidine (PEPCID) tablet 20 mg  20 mg Oral BID Julianne Rice, MD   20 mg at 04/07/17 1022  . haloperidol (HALDOL) tablet 0.5 mg  0.5 mg Oral BID Corena Pilgrim, MD      .  lamoTRIgine (LAMICTAL) tablet 75 mg  75 mg Oral BID Julianne Rice, MD   75 mg at 04/07/17 1022  . [START ON 04/08/2017] levothyroxine (SYNTHROID, LEVOTHROID) tablet 50 mcg  50 mcg Oral QAC breakfast Ruthanna Macchia, MD      . LORazepam (ATIVAN) tablet 0.5 mg  0.5 mg Oral BID PRN Julianne Rice, MD   0.5 mg at 04/07/17 1022   Current Outpatient Prescriptions  Medication Sig Dispense Refill  . albuterol (PROVENTIL HFA) 108 (90 BASE) MCG/ACT inhaler Inhale 2 puffs into the lungs every 6 (six) hours as needed. For shortness of breath 1 Inhaler 0  . aspirin 81  MG chewable tablet Chew 81 mg by mouth daily.    . budesonide-formoterol (SYMBICORT) 80-4.5 MCG/ACT inhaler Inhale 2 puffs into the lungs 2 (two) times daily as needed (wheezing).    . cyanocobalamin 500 MCG tablet Take 500 mcg by mouth daily.    . famotidine (PEPCID) 20 MG tablet Take 1 tablet (20 mg total) by mouth 2 (two) times daily. For 30 days post op for gastroprotection. 60 tablet 0  . feeding supplement (BOOST / RESOURCE BREEZE) LIQD Take 1 Container by mouth 3 (three) times daily between meals. 90 Container 0  . ferrous sulfate 325 (65 FE) MG tablet Take 1 tablet (325 mg total) by mouth daily. 30 tablet 0  . lamoTRIgine (LAMICTAL) 25 MG tablet Take 75 mg by mouth 2 (two) times daily.    Marland Kitchen levothyroxine (SYNTHROID, LEVOTHROID) 50 MCG tablet TAKE 1 TABLET (50 MCG TOTAL) BY MOUTH DAILY BEFORE BREAKFAST. 30 tablet 0  . LORazepam (ATIVAN) 0.5 MG tablet Take 0.5 mg by mouth 2 (two) times daily as needed for anxiety.    . Multiple Vitamins-Minerals (MULTIVITAMIN WITH MINERALS) tablet Take 1 tablet by mouth daily.    Marland Kitchen PRESCRIPTION MEDICATION Apply 1 mg topically every 6 (six) hours as needed (agitation). Lorazepam Gel 83m/mL    . traMADol (ULTRAM) 50 MG tablet Take 1 tablet (50 mg total) by mouth 2 (two) times daily as needed for moderate pain. 30 tablet 0  . aspirin EC 325 MG tablet Take 1 tablet (325 mg total) by mouth daily. For 30 days post op for DVT Prophylaxis.  Resume 81 mg ASA when completed. (Patient not taking: Reported on 04/06/2017) 30 tablet 0  . polyethylene glycol (MIRALAX / GLYCOLAX) packet Take 17 g by mouth daily as needed for mild constipation. (Patient not taking: Reported on 04/06/2017) 14 each 0    Musculoskeletal: Strength & Muscle Tone: within normal limits Gait & Station: unsteady Patient leans: Front  Psychiatric Specialty Exam: Physical Exam  Psychiatric: Her speech is normal. Her mood appears anxious. She is actively hallucinating and combative. Thought content  is paranoid. Cognition and memory are impaired. She expresses impulsivity.    Review of Systems  Constitutional: Negative.   Eyes: Negative.   Respiratory: Negative.   Gastrointestinal: Negative.   Genitourinary: Negative.   Skin: Negative.   Neurological: Negative.   Endo/Heme/Allergies: Negative.   Psychiatric/Behavioral: Positive for hallucinations and memory loss.    Blood pressure (!) 149/79, pulse 79, temperature 97.8 F (36.6 C), temperature source Oral, resp. rate 17, SpO2 97 %.There is no height or weight on file to calculate BMI.  General Appearance: Casual  Eye Contact:  Good  Speech:  Clear and Coherent  Volume:  Decreased  Mood:  Anxious  Affect:  Constricted  Thought Process:  Disorganized  Orientation:  Other:  only to place and person  Thought  Content:  Hallucinations: Visual and Paranoid Ideation  Suicidal Thoughts:  No  Homicidal Thoughts:  No  Memory:  Immediate;   Fair Recent;   Poor Remote;   Poor  Judgement:  Fair  Insight:  Lacking  Psychomotor Activity:  Restlessness  Concentration:  Concentration: Fair and Attention Span: Fair  Recall:  Poor  Fund of Knowledge:  Fair  Language:  Good  Akathisia:  No  Handed:  Right  AIMS (if indicated):     Assets:  Communication Skills Social Support Others:  family support  ADL's:  Impaired  Cognition:  Impaired,  Moderate  Sleep:   poor     Treatment Plan Summary: Daily contact with patient to assess and evaluate symptoms and progress in treatment, Medication management. Add Haldol 0.1 mg bid for agitation, psychosis/delusions. Continue Lamotrigine 75 mg bid for agitation  Disposition: No evidence of imminent risk to self or others at present.   Patient does not meet criteria for psychiatric inpatient admission. 24 hour medication management, will re-evaluate in am  Corena Pilgrim, MD 04/07/2017 11:20 AM

## 2017-04-07 NOTE — Care Management Note (Signed)
Case Management Note  Pt from [place], active with [facility] PT/OT only.  Arleene Settle, Benjaman Lobe, RN 04/07/2017, 12:28 PM

## 2017-04-07 NOTE — BH Assessment (Addendum)
Assessment Note  Kathryn Whitaker is an 83 y.o. female who presents to the ED voluntarily BIB her son from her nursing home. Pt difficult to arouse during the assessment and barely opens her eyes. Pt's speech is inaudible and incoherent at times throughout the assessment. Per chart, pt has been experiencing AVH and believes that snakes are crawling on her skin. Pt recently had a fall per chart. Pt has also been confused and fighting staff at her nursing home.   TTS attempted to contact the pt's son at the telephone number identified in the chart of [phone] but did not receive an answer. A HIPAA compliant voicemail was left for the pt's son to contact TTS in order to obtain collateral information.  Due to the pt's limited history and inability to speak with the pt's son to obtain collateral information, Lindon Romp, NP recommends continued observation for safety. EDP Dr. Stark Jock, MD notified of recommendation.   Diagnosis: Unspecified Delusional D/O  Past Medical History:  Past Medical History:  Diagnosis Date  . Acute asthmatic bronchitis   . Allergic rhinitis   . Alzheimer's dementia   . Anxiety   . Chronic constipation   . Diverticulosis of colon   . DJD (degenerative joint disease)   . GERD (gastroesophageal reflux disease)   . History of colonic polyps   . Hypercholesteremia   . Hypertension   . IBS (irritable bowel syndrome)   . Lacunar infarction   . Lumbar back pain   . Malnutrition ([place])   . UTI (lower urinary tract infection)   . Vitamin D deficiency     Past Surgical History:  Procedure Laterality Date  . ABDOMINAL HYSTERECTOMY    . FEMUR IM NAIL Left 01/13/2017   Procedure: TROCHANTERIC INTRAMEDULLARY (IM) NAIL FEMORAL;  Surgeon: Renette Butters, MD;  Location: [facility];  Service: Orthopedics;  Laterality: Left;    Family History:  Family History  Problem Relation Age of Onset  . Hypertension Other     Social History:  reports that she has never smoked. She has  never used smokeless tobacco. She reports that she does not drink alcohol or use drugs.  Additional Social History:  Alcohol / Drug Use Pain Medications: See MAR Prescriptions: See MAR Over the Counter: See MAR History of alcohol / drug use?: No history of alcohol / drug abuse (per chart)  CIWA: CIWA-Ar BP: (!) 159/78 Pulse Rate: 82 COWS:    Allergies:  Allergies  Allergen Reactions  . Demerol [Meperidine]     Pt states she can't remember type of reaction  . Influenza Vaccines     Pt can't remember reaction but states allergic and has not had in 25 years  . Morphine And Related Dermatitis  . Peanuts [Peanut Oil]     Pt reports she is allergic to peanuts, but was asking to eat peanut butter. Pt unsure of her reaction.   [name] [Amoxicill-Clarithro-Lansopraz] Hives    .Marland KitchenHas patient had a PCN reaction causing immediate rash, facial/tongue/throat swelling, SOB or lightheadedness with hypotension: unknown Has patient had a PCN reaction causing severe rash involving mucus membranes or skin necrosis: unknown Has patient had a PCN reaction that required hospitalization unknown Has patient had a PCN reaction occurring within the last 10 years: unknown If all of the above answers are "NO", then may proceed with Cephalosporin use.   . Telithromycin Hives    REACTION: pt states HIVES....    Home Medications:  (Not in a hospital admission)  OB/GYN Status:  No LMP recorded. Patient has had a hysterectomy.  General Assessment Data Location of Assessment: [facility] ED TTS Assessment: In system Is this a Tele or Face-to-Face Assessment?: Face-to-Face Is this an Initial Assessment or a Re-assessment for this encounter?: Initial Assessment Marital status:  (unknown) Is patient pregnant?: No Pregnancy Status: No Living Arrangements: Group Home (nursing home [facility]) Can pt return to current living arrangement?: Yes Admission Status: Voluntary Is patient capable of signing voluntary  admission?: Yes Referral Source: Self/Family/Friend Insurance type: BCBS [facility]     Crisis Care Plan Living Arrangements: Group Home (nursing home [facility]) Name of Psychiatrist: [place] Name of Therapist: UTA  Education Status Is patient currently in school?: No Highest grade of school patient has completed: UTA  Risk to self with the past 6 months Suicidal Ideation: No (NONE REPORTED IN TRIAGE PER CHART) Has patient been a risk to self within the past 6 months prior to admission? : No Suicidal Intent: No Has patient had any suicidal intent within the past 6 months prior to admission? : No Is patient at risk for suicide?: No Suicidal Plan?: No Has patient had any suicidal plan within the past 6 months prior to admission? : No Access to Means: No What has been your use of drugs/alcohol within the last 12 months?: UTA, LABS PENDING  Previous Attempts/Gestures:  (NONE KNOWN TO THIS WRITER ) Triggers for Past Attempts: Unknown Intentional Self Injurious Behavior:  (UTA) Family Suicide History: Unknown Recent stressful life event(s): Other (Comment), Trauma (Comment) (RECENT AVH, RECENT FALL) Persecutory voices/beliefs?: No Depression:  (UTA) Depression Symptoms: Insomnia (PER CHART) Substance abuse history and/or treatment for substance abuse?:  (UNKNOWN) Suicide prevention information given to non-admitted patients: Not applicable  Risk to Others within the past 6 months Homicidal Ideation: No (NONE REPORTED IN TRIAGE PER CHART) Does patient have any lifetime risk of violence toward others beyond the six months prior to admission? : No Thoughts of Harm to Others: No Current Homicidal Intent: No Current Homicidal Plan: No Access to Homicidal Means: No History of harm to others?: No Assessment of Violence: None Noted Does patient have access to weapons?: No Criminal Charges Pending?: No Does patient have a court date: No Is patient on probation?: No  Psychosis Hallucinations:  Auditory, Visual Delusions: Unspecified  Mental Status Report Appearance/Hygiene: In hospital gown Eye Contact: Poor Motor Activity: Unsteady Speech: Incoherent, Soft, Slow Level of Consciousness: Sleeping Mood: Helpless Affect: Constricted Anxiety Level: None Thought Processes: Flight of Ideas (PER TRIAGE NOTE) Judgement: Impaired Orientation: Unable to assess Obsessive Compulsive Thoughts/Behaviors: Unable to Assess  Cognitive Functioning Concentration: Unable to Assess Memory: Unable to Assess IQ: Average Insight: Unable to Assess Impulse Control: Unable to Assess Appetite:  (UTA) Sleep: Decreased Total Hours of Sleep:  (PER CHART PT HAS NOT SLEPT IN 2+ DAYS) Vegetative Symptoms: Unable to Assess  ADLScreening [facility] Assessment Services) Patient's cognitive ability adequate to safely complete daily activities?: No Patient able to express need for assistance with ADLs?: Yes Independently performs ADLs?: No  Prior Inpatient Therapy Prior Inpatient Therapy: No  Prior Outpatient Therapy Prior Outpatient Therapy:  (UNKNOWN) Does patient have an ACCT team?: No Does patient have Intensive In-House Services?  : No Does patient have [facility] services? : No Does patient have P4CC services?: No  ADL Screening (condition at time of admission) Patient's cognitive ability adequate to safely complete daily activities?: No Is the patient deaf or have difficulty hearing?: Yes Does the patient have difficulty seeing, even when wearing glasses/contacts?:  Yes Does the patient have difficulty concentrating, remembering, or making decisions?: Yes Patient able to express need for assistance with ADLs?: Yes Does the patient have difficulty dressing or bathing?: Yes Independently performs ADLs?: No Communication: Independent Dressing (OT): Needs assistance Is this a change from baseline?: Pre-admission baseline Grooming: Needs assistance Is this a change from baseline?: Pre-admission  baseline Feeding: Needs assistance Is this a change from baseline?: Pre-admission baseline Bathing: Needs assistance Is this a change from baseline?: Pre-admission baseline Toileting: Needs assistance Is this a change from baseline?: Pre-admission baseline In/Out Bed: Needs assistance Is this a change from baseline?: Pre-admission baseline Walks in Home: Needs assistance Is this a change from baseline?: Pre-admission baseline Does the patient have difficulty walking or climbing stairs?: Yes Weakness of Legs: Both Weakness of Arms/Hands: Both  Home Assistive Devices/Equipment Home Assistive Devices/Equipment: Wheelchair    Abuse/Neglect Assessment (Assessment to be complete while patient is alone) Physical Abuse:  (UTA) Verbal Abuse:  (UTA) Sexual Abuse:  (UTA) Exploitation of patient/patient's resources:  (UTA) Self-Neglect:  (UTA)     Advance Directives (For Healthcare) Does Patient Have a Medical Advance Directive?: Yes (per chart, unable to verify with pt ) Does patient want to make changes to medical advance directive?:  (unknown, unable to verify what type with pt due to pt's AMS during assessment ) Type of Advance Directive: Out of facility DNR (pink MOST or yellow form) Pre-existing out of facility DNR order (yellow form or pink MOST form): Yellow form placed in chart (order not valid for inpatient use)    Additional Information 1:1 In Past 12 Months?: No CIRT Risk: No Elopement Risk: No Does patient have medical clearance?: Yes     Disposition:  Disposition Initial Assessment Completed for this Encounter: Yes Disposition of Patient: Re-evaluation by Psychiatry recommended (PER Lindon Romp, NP [place])  On Site Evaluation by:   Reviewed with Physician:    Lyanne Co 04/07/2017 12:34 AM

## 2017-04-07 NOTE — Telephone Encounter (Signed)
Needs verbals for one additional visit for a discharge visit

## 2017-04-07 NOTE — Progress Notes (Signed)
TTS attempted to contact the pt's son at the telephone number identified in the chart of [phone] but did not receive an answer. A HIPAA compliant voicemail was left for the pt's son to contact TTS in order to obtain collateral information.  Lind Covert, MSW, LCSW Therapeutic Triage Specialist  [phone]

## 2017-04-07 NOTE — ED Notes (Signed)
Son, Ephriam Knuckles, POA-states he is upset due to no one updating him on his mother's condition last night-states he lives in [place] and would like to be a part of his mother's care plan-son wants to know if she needs psychiatry or remain at her current nursing facility

## 2017-04-07 NOTE — Progress Notes (Signed)
TTS attempted to complete assessment with pt. Pt's hx is limited. Pt inaudible while TTS is attempting to speak with her. Per chart, pt was brought in by her son. TTS obtained verbal consent from the pt to contact her son who brought her into the ED in order to obtain collateral information.   Lind Covert, MSW, LCSW Therapeutic Triage Specialist  [phone]

## 2017-04-07 NOTE — ED Provider Notes (Signed)
Patient was evaluated by TTS. They feel as though overnight observation and reassessment in the morning by the psychiatrist is the most appropriate course of action.   Veryl Speak, MD 04/07/17 [phone]

## 2017-04-07 NOTE — Telephone Encounter (Signed)
fine

## 2017-04-07 NOTE — BH Assessment (Signed)
[facility] Assessment Progress Note   Per Lindon Romp, NP Pt is to be observed overnight for safety measures and monitored until collateral information can be obtained from the pt's family and nursing home. EDP Dr. Stark Jock, MD notified of recommendation. TTS attempted to inform pt's nurse Luz Lex, RN but was placed on hold for more than 10 minutes before the call was disconnected.  Lind Covert, MSW, LCSW Therapeutic Triage Specialist  [phone]

## 2017-04-08 DIAGNOSIS — R443 Hallucinations, unspecified: Secondary | ICD-10-CM | POA: Insufficient documentation

## 2017-04-08 DIAGNOSIS — F22 Delusional disorders: Secondary | ICD-10-CM | POA: Diagnosis not present

## 2017-04-08 DIAGNOSIS — R4587 Impulsiveness: Secondary | ICD-10-CM | POA: Diagnosis not present

## 2017-04-08 DIAGNOSIS — F0391 Unspecified dementia with behavioral disturbance: Secondary | ICD-10-CM | POA: Diagnosis not present

## 2017-04-08 MED ORDER — HALOPERIDOL 0.5 MG PO TABS: 0.5000 mg | ORAL_TABLET | Freq: Two times a day (BID) | ORAL | 0 refills | Status: DC

## 2017-04-08 MED ORDER — LAMOTRIGINE 25 MG PO TABS: 75.0000 mg | ORAL_TABLET | Freq: Two times a day (BID) | ORAL | 0 refills | Status: AC

## 2017-04-08 NOTE — Progress Notes (Signed)
CSW received a call from [place] with [facility] requesting an update on patients discharge plans. CSW informed Joy patient was medicalyl and psychiatrically cleared from the ED at this time and would be returning shortly. Per RN, patients son is to pick patient up for transportation back to facility.   Kingsley Spittle, [facility] Emergency Room Clinical Social Worker [phone]

## 2017-04-08 NOTE — Consult Note (Signed)
[facility] Psychiatry Consult   Reason for Consult:  Increased confusion Referring Physician:  EDP Patient Identification: Kathryn Whitaker MRN:  [id number] Principal Diagnosis: Dementia with psychosis Diagnosis:   Patient Active Problem List   Diagnosis Date Noted  . Hallucinations [R44.3]   . Acute blood loss anemia [D62] 01/15/2017  . Hip fracture, left ([place]) [S72.002A] 01/12/2017  . Hip fracture ([place]) [S72.009A] 01/12/2017  . S/p left hip fracture [Z87.81] 01/12/2017  . Dementia with psychosis [F03.91] 07/26/2016  . Dysuria [R30.0] 08/10/2015  . Hypothyroidism [E03.9] 06/06/2015  . Protein-calorie malnutrition, severe ([place]) [E43] 06/26/2014  . Constipation [K59.00] 12/17/2013  . COPD (chronic obstructive pulmonary disease) ([place]) [J44.9] 06/03/2013  . IBS (irritable bowel syndrome) [K58.9] 12/30/2012  . Generalized anxiety disorder [F41.1] 12/30/2012  . Dyslipidemia [E78.5] 12/12/2012  . HTN (hypertension) [I10] 12/12/2012  . Hx of completed stroke [Z86.73] 07/11/2009    Total Time spent with patient: 45 minutes  Subjective:   Kathryn Whitaker is a 83 y.o. female patient admitted with visual hallucinations, seeing snakes in her room.   HPI:  Pt was seen and chart reviewed with Dr Darleene Cleaver and treatment team. Pt has remained calm and cooperative while in the [facility]. Medication adjustments were made. Pt was brought from her ALF ([facility]), by her son, due to the belief she was seeing snakes in her room and increased confusion. [name], who is her POA, was present during the assessment. [name] will follow up with Pt's PCP in order to obtain a referral to Neurology for a full dementia work up. Pt is psychiatrically clear for discharge.   Past Psychiatric History: As above  Risk to Self: None Risk to Others: None Prior Inpatient Therapy: Prior Inpatient Therapy: No Prior Outpatient Therapy: Prior Outpatient Therapy:  (UNKNOWN) Does patient have an ACCT team?: No Does  patient have Intensive In-House Services?  : No Does patient have [facility] services? : No Does patient have P4CC services?: No  Past Medical History:  Past Medical History:  Diagnosis Date  . Acute asthmatic bronchitis   . Allergic rhinitis   . Alzheimer's dementia   . Anxiety   . Chronic constipation   . Diverticulosis of colon   . DJD (degenerative joint disease)   . GERD (gastroesophageal reflux disease)   . History of colonic polyps   . Hypercholesteremia   . Hypertension   . IBS (irritable bowel syndrome)   . Lacunar infarction   . Lumbar back pain   . Malnutrition ([place])   . UTI (lower urinary tract infection)   . Vitamin D deficiency     Past Surgical History:  Procedure Laterality Date  . ABDOMINAL HYSTERECTOMY    . FEMUR IM NAIL Left 01/13/2017   Procedure: TROCHANTERIC INTRAMEDULLARY (IM) NAIL FEMORAL;  Surgeon: Renette Butters, MD;  Location: [facility];  Service: Orthopedics;  Laterality: Left;   Family History:  Family History  Problem Relation Age of Onset  . Hypertension Other    Family Psychiatric  History: Unknown Social History:  History  Alcohol Use No     History  Drug Use No    Social History   Social History  . Marital status: Widowed    Spouse name: N/A  . Number of children: 6  . Years of education: N/A   Social History Main Topics  . Smoking status: Never Smoker  . Smokeless tobacco: Never Used  . Alcohol use No  . Drug use: No  . Sexual activity: Not  Asked   Other Topics Concern  . None   Social History Narrative  . None   Additional Social History:    Allergies:   Allergies  Allergen Reactions  . Demerol [Meperidine]     Pt states she can't remember type of reaction  . Influenza Vaccines     Pt can't remember reaction but states allergic and has not had in 25 years  . Morphine And Related Dermatitis  . Peanuts [Peanut Oil]     Pt reports she is allergic to peanuts, but was asking to eat peanut butter. Pt unsure of her  reaction.   [name] [Amoxicill-Clarithro-Lansopraz] Hives    .Marland KitchenHas patient had a PCN reaction causing immediate rash, facial/tongue/throat swelling, SOB or lightheadedness with hypotension: unknown Has patient had a PCN reaction causing severe rash involving mucus membranes or skin necrosis: unknown Has patient had a PCN reaction that required hospitalization unknown Has patient had a PCN reaction occurring within the last 10 years: unknown If all of the above answers are "NO", then may proceed with Cephalosporin use.   . Telithromycin Hives    REACTION: pt states HIVES....    Labs:  Results for orders placed or performed during the hospital encounter of 04/06/17 (from the past 48 hour(s))  Rapid urine drug screen (hospital performed)     Status: None   Collection Time: 04/06/17  8:57 PM  Result Value Ref Range   Opiates NONE DETECTED NONE DETECTED   Cocaine NONE DETECTED NONE DETECTED   Benzodiazepines NONE DETECTED NONE DETECTED   Amphetamines NONE DETECTED NONE DETECTED   Tetrahydrocannabinol NONE DETECTED NONE DETECTED   Barbiturates NONE DETECTED NONE DETECTED    Comment:        DRUG SCREEN FOR MEDICAL PURPOSES ONLY.  IF CONFIRMATION IS NEEDED FOR ANY PURPOSE, NOTIFY LAB WITHIN 5 DAYS.        LOWEST DETECTABLE LIMITS FOR URINE DRUG SCREEN Drug Class       Cutoff (ng/mL) Amphetamine      1000 Barbiturate      200 Benzodiazepine   409 Tricyclics       811 Opiates          300 Cocaine          300 THC              50   Urinalysis, Routine w reflex microscopic     Status: Abnormal   Collection Time: 04/06/17  9:19 PM  Result Value Ref Range   Color, Urine YELLOW YELLOW   APPearance CLEAR CLEAR   Specific Gravity, Urine 1.008 1.005 - 1.030   pH 7.0 5.0 - 8.0   Glucose, UA NEGATIVE NEGATIVE mg/dL   Hgb urine dipstick SMALL (A) NEGATIVE   Bilirubin Urine NEGATIVE NEGATIVE   Ketones, ur NEGATIVE NEGATIVE mg/dL   Protein, ur NEGATIVE NEGATIVE mg/dL   Nitrite NEGATIVE  NEGATIVE   Leukocytes, UA NEGATIVE NEGATIVE   RBC / HPF 0-5 0 - 5 RBC/hpf   WBC, UA 0-5 0 - 5 WBC/hpf   Bacteria, UA RARE (A) NONE SEEN   Squamous Epithelial / LPF NONE SEEN NONE SEEN  CBC with Differential/Platelet     Status: Abnormal   Collection Time: 04/06/17 11:01 PM  Result Value Ref Range   WBC 4.8 4.0 - 10.5 K/uL   RBC 3.84 (L) 3.87 - 5.11 MIL/uL   Hemoglobin 11.3 (L) 12.0 - 15.0 g/dL   HCT 35.2 (L) 36.0 - 46.0 %   MCV  91.7 78.0 - 100.0 fL   MCH 29.4 26.0 - 34.0 pg   MCHC 32.1 30.0 - 36.0 g/dL   RDW 14.2 11.5 - 15.5 %   Platelets 257 150 - 400 K/uL   Neutrophils Relative % 57 %   Neutro Abs 2.8 1.7 - 7.7 K/uL   Lymphocytes Relative 27 %   Lymphs Abs 1.3 0.7 - 4.0 K/uL   Monocytes Relative 12 %   Monocytes Absolute 0.6 0.1 - 1.0 K/uL   Eosinophils Relative 3 %   Eosinophils Absolute 0.1 0.0 - 0.7 K/uL   Basophils Relative 1 %   Basophils Absolute 0.0 0.0 - 0.1 K/uL  Comprehensive metabolic panel     Status: Abnormal   Collection Time: 04/06/17 11:01 PM  Result Value Ref Range   Sodium 138 135 - 145 mmol/L   Potassium 4.5 3.5 - 5.1 mmol/L   Chloride 104 101 - 111 mmol/L   CO2 27 22 - 32 mmol/L   Glucose, Bld 80 65 - 99 mg/dL   BUN 14 6 - 20 mg/dL   Creatinine, Ser 0.48 0.44 - 1.00 mg/dL   Calcium 9.5 8.9 - 10.3 mg/dL   Total Protein 6.6 6.5 - 8.1 g/dL   Albumin 3.1 (L) 3.5 - 5.0 g/dL   AST 22 15 - 41 U/L   ALT 15 14 - 54 U/L   Alkaline Phosphatase 72 38 - 126 U/L   Total Bilirubin 0.5 0.3 - 1.2 mg/dL   GFR calc non Af Amer >60 >60 mL/min   GFR calc Af Amer >60 >60 mL/min    Comment: (NOTE) The eGFR has been calculated using the CKD EPI equation. This calculation has not been validated in all clinical situations. eGFR's persistently <60 mL/min signify possible Chronic Kidney Disease.    Anion gap 7 5 - 15  Ethanol     Status: None   Collection Time: 04/06/17 11:02 PM  Result Value Ref Range   Alcohol, Ethyl (B) <10 <10 mg/dL    Comment:        LOWEST  DETECTABLE LIMIT FOR SERUM ALCOHOL IS 10 mg/dL FOR MEDICAL PURPOSES ONLY   TSH     Status: None   Collection Time: 04/06/17 11:02 PM  Result Value Ref Range   TSH 3.635 0.350 - 4.500 uIU/mL    Comment: Performed by a 3rd Generation assay with a functional sensitivity of <=0.01 uIU/mL.  T4, free     Status: Abnormal   Collection Time: 04/06/17 11:02 PM  Result Value Ref Range   Free T4 1.31 (H) 0.61 - 1.12 ng/dL    Comment: (NOTE) Biotin ingestion may interfere with free T4 tests. If the results are inconsistent with the TSH level, previous test results, or the clinical presentation, then consider biotin interference. If needed, order repeat testing after stopping biotin. Performed at [facility], [place] [address], [place], [place] [address]     Current Facility-Administered Medications  Medication Dose Route Frequency Provider Last Rate Last Dose  . albuterol (PROVENTIL HFA;VENTOLIN HFA) 108 (90 Base) MCG/ACT inhaler 2 puff  2 puff Inhalation Q6H PRN Gara Kincade, MD      . aspirin chewable tablet 81 mg  81 mg Oral Daily Julianne Rice, MD   81 mg at 04/08/17 0901  . famotidine (PEPCID) tablet 20 mg  20 mg Oral BID Julianne Rice, MD   20 mg at 04/08/17 0901  . haloperidol (HALDOL) tablet 0.5 mg  0.5 mg Oral BID Guida Asman,  MD   0.5 mg at 04/08/17 0901  . lamoTRIgine (LAMICTAL) tablet 75 mg  75 mg Oral BID Julianne Rice, MD   75 mg at 04/08/17 0901  . levothyroxine (SYNTHROID, LEVOTHROID) tablet 50 mcg  50 mcg Oral QAC breakfast Corena Pilgrim, MD   50 mcg at 04/08/17 0857  . LORazepam (ATIVAN) tablet 0.5 mg  0.5 mg Oral BID PRN Julianne Rice, MD   0.5 mg at 04/07/17 1022   Current Outpatient Prescriptions  Medication Sig Dispense Refill  . albuterol (PROVENTIL HFA) 108 (90 BASE) MCG/ACT inhaler Inhale 2 puffs into the lungs every 6 (six) hours as needed. For shortness of breath 1 Inhaler 0  . aspirin 81 MG chewable tablet Chew 81 mg by mouth daily.     . budesonide-formoterol (SYMBICORT) 80-4.5 MCG/ACT inhaler Inhale 2 puffs into the lungs 2 (two) times daily as needed (wheezing).    . cyanocobalamin 500 MCG tablet Take 500 mcg by mouth daily.    . famotidine (PEPCID) 20 MG tablet Take 1 tablet (20 mg total) by mouth 2 (two) times daily. For 30 days post op for gastroprotection. 60 tablet 0  . feeding supplement (BOOST / RESOURCE BREEZE) LIQD Take 1 Container by mouth 3 (three) times daily between meals. 90 Container 0  . ferrous sulfate 325 (65 FE) MG tablet Take 1 tablet (325 mg total) by mouth daily. 30 tablet 0  . lamoTRIgine (LAMICTAL) 25 MG tablet Take 75 mg by mouth 2 (two) times daily.    Marland Kitchen levothyroxine (SYNTHROID, LEVOTHROID) 50 MCG tablet TAKE 1 TABLET (50 MCG TOTAL) BY MOUTH DAILY BEFORE BREAKFAST. 30 tablet 0  . LORazepam (ATIVAN) 0.5 MG tablet Take 0.5 mg by mouth 2 (two) times daily as needed for anxiety.    . Multiple Vitamins-Minerals (MULTIVITAMIN WITH MINERALS) tablet Take 1 tablet by mouth daily.    Marland Kitchen PRESCRIPTION MEDICATION Apply 1 mg topically every 6 (six) hours as needed (agitation). Lorazepam Gel 60m/mL    . traMADol (ULTRAM) 50 MG tablet Take 1 tablet (50 mg total) by mouth 2 (two) times daily as needed for moderate pain. 30 tablet 0  . aspirin EC 325 MG tablet Take 1 tablet (325 mg total) by mouth daily. For 30 days post op for DVT Prophylaxis.  Resume 81 mg ASA when completed. (Patient not taking: Reported on 04/06/2017) 30 tablet 0  . polyethylene glycol (MIRALAX / GLYCOLAX) packet Take 17 g by mouth daily as needed for mild constipation. (Patient not taking: Reported on 04/06/2017) 14 each 0    Musculoskeletal: Strength & Muscle Tone: within normal limits Gait & Station: normal Patient leans: N/A  Psychiatric Specialty Exam: Physical Exam  Constitutional: She appears well-developed and well-nourished.  HENT:  Head: Normocephalic.  Respiratory: Effort normal.  Musculoskeletal: Normal range of motion.   Neurological: She is alert.  Psychiatric: Her speech is normal and behavior is normal. Thought content normal. She expresses impulsivity. She exhibits a depressed mood. She exhibits abnormal recent memory and abnormal remote memory.    Review of Systems  Psychiatric/Behavioral: Positive for depression and memory loss. Negative for hallucinations, substance abuse and suicidal ideas. The patient is not nervous/anxious and does not have insomnia.   All other systems reviewed and are negative.   Blood pressure (!) 156/88, pulse 73, temperature 98.2 F (36.8 C), temperature source Oral, resp. rate (!) 8, SpO2 98 %.There is no height or weight on file to calculate BMI.  General Appearance: Casual  Eye Contact:  Fair  Speech:  Clear and Coherent  Volume:  Normal  Mood:  Depressed and Dementia  Affect:  Congruent  Thought Process:  Disorganized  Orientation:  Other:  self and place  Thought Content:  UTA, dementia  Suicidal Thoughts:  No  Homicidal Thoughts:  No  Memory:  Immediate;   Poor Recent;   Poor Remote;   Poor  Judgement:  Other:  UTA Dementia  Insight:  UTA dementia  Psychomotor Activity:  Decreased  Concentration:  Concentration: Fair and Attention Span: Fair  Recall:  Poor  Fund of Knowledge:  Fair  Language:  Good  Akathisia:  No  Handed:  Right  AIMS (if indicated):     Assets:  Financial Resources/Insurance Housing Social Support  ADL's:  Intact  Cognition:  Impaired,  Moderate  Sleep:        Treatment Plan Summary: Plan Dementia with psychosis  Discharge Home Take all medications as prescribed Follow up with PCP for a referral to an Neurologist Make an appointment with an  Neurologist   Disposition: No evidence of imminent risk to self or others at present.   Patient does not meet criteria for psychiatric inpatient admission. Supportive therapy provided about ongoing stressors. Discussed crisis plan, support from social network, calling 911, coming to the  Emergency Department, and calling Suicide Hotline.  Ethelene Hal, NP 04/08/2017 10:35 AM  Patient seen face-to-face for psychiatric evaluation, chart reviewed and case discussed with the physician extender and developed treatment plan. Reviewed the information documented and agree with the treatment plan. Corena Pilgrim, MD

## 2017-04-08 NOTE — Clinical Social Work Note (Signed)
Clinical Social Work Assessment *Late Entry*  Patient Details  Name: Kathryn Whitaker MRN: [id number] Date of Birth: April 21, 1920  Date of referral:  04/08/17               Reason for consult:  Facility Placement                Permission sought to share information with:  Facility Art therapist granted to share information::  Yes, Verbal Permission Granted  Name::        Agency::     Relationship::     Contact Information:     Housing/Transportation Living arrangements for the past 2 months:  [place] of Information:  Adult Children Patient Interpreter Needed:  None Criminal Activity/Legal Involvement Pertinent to Current Situation/Hospitalization:    Significant Relationships:  Adult Children Lives with:  Facility Resident Do you feel safe going back to the place where you live?  No Need for family participation in patient care:  Yes (Comment)  Care giving concerns:  [name] is concerned pt has dementia but has not been formally diagnosed.  [name] requests to speak to the psychiatrist on 10/16 to request a neurologist's diagnosis of dementia.   Social Worker assessment / plan:  CSW met with pt's son, pt was not alert and oriented and confirmed pt's son's plan for the pt to be discharged to SNF to live at discharge.  CSW provided active listening and validated pt's concerns.    Pt has been living independently prior to being admitted to [facility].   Employment status:  Retired Nurse, adult, [employer] PT Recommendations:  Not assessed at this time Information / Referral to community resources:     Patient/Family's Response to care:  Patient not alert and oriented.  Patient's son agreeable to plan.  [name] supportive and strongly involved in pt.'s care.  Pt.'s son pleasant and appreciated CSW intervention.    Patient/Family's Understanding of and Emotional Response to Diagnosis, Current Treatment, and  Prognosis:  Still assessing  Emotional Assessment Appearance:  Appears stated age Attitude/Demeanor/Rapport:  Unable to Assess Affect (typically observed):  Unable to Assess Orientation:  Fluctuating Orientation (Suspected and/or reported Sundowners) Alcohol / Substance use:    Psych involvement (Current and /or in the community):     Discharge Needs  Concerns to be addressed:  Home Safety Concerns Readmission within the last 30 days:  No Current discharge risk:  Cognitively Impaired Barriers to Discharge:  No Barriers Identified  *Late Entry* Claudine Mouton, LCSWA 04/08/2017, 6:24 PM

## 2017-04-08 NOTE — Telephone Encounter (Signed)
Called Betty no answer LMOM w/MD response...Kathryn Whitaker

## 2017-04-08 NOTE — [facility] Suicide Risk Assessment (Signed)
Suicide Risk Assessment  Discharge Assessment   [facility] Discharge Suicide Risk Assessment   Principal Problem: Dementia with psychosis Discharge Diagnoses:  Patient Active Problem List   Diagnosis Date Noted  . Hallucinations [R44.3]   . Acute blood loss anemia [D62] 01/15/2017  . Hip fracture, left ([place]) [S72.002A] 01/12/2017  . Hip fracture ([place]) [S72.009A] 01/12/2017  . S/p left hip fracture [Z87.81] 01/12/2017  . Dementia with psychosis [F03.91] 07/26/2016  . Dysuria [R30.0] 08/10/2015  . Hypothyroidism [E03.9] 06/06/2015  . Protein-calorie malnutrition, severe ([place]) [E43] 06/26/2014  . Constipation [K59.00] 12/17/2013  . COPD (chronic obstructive pulmonary disease) ([place]) [J44.9] 06/03/2013  . IBS (irritable bowel syndrome) [K58.9] 12/30/2012  . Generalized anxiety disorder [F41.1] 12/30/2012  . Dyslipidemia [E78.5] 12/12/2012  . HTN (hypertension) [I10] 12/12/2012  . Hx of completed stroke [Z86.73] 07/11/2009    Total Time spent with patient: 45 minutes  Musculoskeletal: Strength & Muscle Tone: within normal limits Gait & Station: normal Patient leans: N/A  Psychiatric Specialty Exam: Physical Exam  Constitutional: She appears well-developed and well-nourished.  HENT:  Head: Normocephalic.  Respiratory: Effort normal.  Musculoskeletal: Normal range of motion.  Neurological: She is alert.  Psychiatric: Her speech is normal and behavior is normal. Thought content normal. She expresses impulsivity. She exhibits a depressed mood. She exhibits abnormal recent memory and abnormal remote memory.   Review of Systems  Psychiatric/Behavioral: Positive for depression and memory loss. Negative for hallucinations, substance abuse and suicidal ideas. The patient is not nervous/anxious and does not have insomnia.   All other systems reviewed and are negative.  Blood pressure (!) 156/88, pulse 73, temperature 98.2 F (36.8 C), temperature source Oral, resp. rate (!) 8, SpO2 98  %.There is no height or weight on file to calculate BMI. General Appearance: Casual Eye Contact:  Fair Speech:  Clear and Coherent Volume:  Normal Mood:  Depressed and Dementia Affect:  Congruent Thought Process:  Disorganized Orientation:  Other:  self and place Thought Content:  UTA, dementia Suicidal Thoughts:  No Homicidal Thoughts:  No Memory:  Immediate;   Poor Recent;   Poor Remote;   Poor Judgement:  Other:  UTA Dementia Insight:  UTA dementia Psychomotor Activity:  Decreased Concentration:  Concentration: Fair and Attention Span: Fair Recall:  Poor Fund of Knowledge:  Fair Language:  Good Akathisia:  No Handed:  Right AIMS (if indicated):    Assets:  Financial Resources/Insurance Housing Social Support ADL's:  Intact Cognition:  Impaired,  Moderate   Mental Status Per Nursing Assessment::   On Admission:   Confused, Dementia  Demographic Factors:  Age 62 or older  Loss Factors: Decline in physical health  Historical Factors: NA  Risk Reduction Factors:   Sense of responsibility to family  Continued Clinical Symptoms:  Previous Psychiatric Diagnoses and Treatments Medical Diagnoses and Treatments/Surgeries  Cognitive Features That Contribute To Risk:  Loss of executive function    Suicide Risk:  Minimal: No identifiable suicidal ideation.  Patients presenting with no risk factors but with morbid ruminations; may be classified as minimal risk based on the severity of the depressive symptoms    Plan Of Care/Follow-up recommendations:  Activity:  as tolerated Diet:  Heart Healthy  Ethelene Hal, NP 04/08/2017, 11:01 AM

## 2017-04-08 NOTE — ED Notes (Signed)
Son took patient back to the nursing facility

## 2017-04-14 ENCOUNTER — Ambulatory Visit: Payer: Medicare Other | Admitting: Internal Medicine

## 2017-04-18 ENCOUNTER — Telehealth: Payer: Self-pay | Admitting: Internal Medicine

## 2017-04-18 NOTE — Telephone Encounter (Signed)
Requesting PT for once a week for one week and then twice a week for six weeks.

## 2017-04-21 NOTE — Telephone Encounter (Signed)
Kathryn Whitaker called back checking on this. I gave her MD response. She said that she would go ahead and get the orders started.

## 2017-04-21 NOTE — Telephone Encounter (Signed)
Okay to give. 

## 2017-04-30 ENCOUNTER — Emergency Department (HOSPITAL_COMMUNITY): Payer: Medicare Other

## 2017-04-30 ENCOUNTER — Emergency Department (HOSPITAL_COMMUNITY)
Admission: EM | Admit: 2017-04-30 | Discharge: 2017-04-30 | Disposition: A | Discharge status: Home / Self Care | Payer: Medicare Other | Attending: Emergency Medicine | Admitting: Emergency Medicine

## 2017-04-30 DIAGNOSIS — Y999 Unspecified external cause status: Secondary | ICD-10-CM | POA: Insufficient documentation

## 2017-04-30 DIAGNOSIS — J449 Chronic obstructive pulmonary disease, unspecified: Secondary | ICD-10-CM | POA: Insufficient documentation

## 2017-04-30 DIAGNOSIS — Y92129 Unspecified place in nursing home as the place of occurrence of the external cause: Secondary | ICD-10-CM | POA: Diagnosis not present

## 2017-04-30 DIAGNOSIS — W050XXA Fall from non-moving wheelchair, initial encounter: Secondary | ICD-10-CM | POA: Diagnosis not present

## 2017-04-30 DIAGNOSIS — M79605 Pain in left leg: Secondary | ICD-10-CM | POA: Diagnosis not present

## 2017-04-30 DIAGNOSIS — M25512 Pain in left shoulder: Secondary | ICD-10-CM | POA: Insufficient documentation

## 2017-04-30 DIAGNOSIS — T07XXXA Unspecified multiple injuries, initial encounter: Secondary | ICD-10-CM | POA: Diagnosis present

## 2017-04-30 DIAGNOSIS — F0390 Unspecified dementia without behavioral disturbance: Secondary | ICD-10-CM | POA: Diagnosis not present

## 2017-04-30 DIAGNOSIS — Y939 Activity, unspecified: Secondary | ICD-10-CM | POA: Insufficient documentation

## 2017-04-30 DIAGNOSIS — I10 Essential (primary) hypertension: Secondary | ICD-10-CM | POA: Diagnosis not present

## 2017-04-30 DIAGNOSIS — W19XXXA Unspecified fall, initial encounter: Secondary | ICD-10-CM

## 2017-04-30 NOTE — Discharge Instructions (Signed)

## 2017-04-30 NOTE — Progress Notes (Signed)
CSW spoke to [facility] (ALF NOT SNF) Pt has been accepted by: [facility] at  Number for report is: [phone] Pt's unit/room/bed number will be: Room 319 at [address], [place], [place] [address] a(nd NOT [facility])   Accepting physician: SNF MD  Pt can arrive ASAP on 11/7  CSW will update RN and EDP.  Alphonse Guild. [place], LCSW, LCAS, CSI Clinical Social Worker Ph: [phone]

## 2017-04-30 NOTE — Progress Notes (Addendum)
Consult request has been received. CSW attempting to follow up at present time.  CSW spoke with pt's son who stated it was his (pt's son's0 understanding pt is not allowed back to [facility] (NOT [facility] in [place]) due to pt "being uncontrollable due to her dementia.    Per pt's son Ruben Gottron at ph: [phone] pt believes "people are poisoning her" and "snakes are trying to kill her.  Per pt's son, pt fell in July 2018 and has had one to 2 orher falls in the past.  Per pt's son he was told by Brita Romp at [facility] pt cannot return.  CSW called [facility] at [facility] who stated pt was at the [facility] at [place], [place] .  CSW spoke to [place] who stated her admin said pt would need Memory Care and that pt could not return and that "pt needed to be worked up and a [place] found at the [facility] ED.  CSW stated facility needs to provide proof that a 30 day notice was given and that charges have been filed or pt would have to return. Heather RN consulted with the admin who then asked heather RN to relay to the CSW that the pt could return to the facility tonight and that a transfer to another facility would be facilitated by [facility] on 11/8.  Per Nira Conn RN at [facility] [phone])  pt could return tonight and that the [place] at [phone] should be called and made aware pt is returning.    CSW spoke to [employer] who is aware pt is returning and said to [phone] to call report at D/C.  CSW will updated RN and EDP.  Alphonse Guild. Nuri Larmer, LCSW, LCAS, CSI Clinical Social Worker Ph: [phone]

## 2017-04-30 NOTE — ED Notes (Signed)
Bed: BM18 Expected date:  Expected time:  Means of arrival:  Comments: EMS/fall/dementia

## 2017-04-30 NOTE — ED Triage Notes (Signed)
Per EMS: Pt is coming from [facility]. Pt fell forward from her wheel chair and hit her knees. Pt has a hx of dementia. Pt did not hit her head and is not on a blood thinner. Pt reports left shoulder, knee, and hip pain.  Pt is having hallucinations which is normal for her. Pt was given haldol approximately 30 mins prior to EMS picking her up.   [place] at the bedside. Pt has not been combative with EMS.   Last Vitals BP 180/90 (Baseline Pressure for her per facility) HR 88 Sats 100% on RA RA 18

## 2017-04-30 NOTE — ED Notes (Signed)
Attempted to call report x3 to [place]

## 2017-04-30 NOTE — ED Notes (Signed)
[facility] called for transport.  

## 2017-04-30 NOTE — Care Management Note (Signed)
Case Management Note  CM noted pt's return to the ED.  Pt from [place].  Pt is active with PT/OT HHS through [facility].  Contacted Tim with [facility] who will follow the outcome from today's encounter.  Son called in requesting pt be moved to a "memory care" unit.  Consulted CSW for when son arrives to ED.  No further CM needs noted at this time.

## 2017-04-30 NOTE — ED Provider Notes (Signed)
Emergency Department Provider Note   I have reviewed the triage vital signs and the nursing notes.   HISTORY  Chief Complaint Fall   HPI Kathryn Whitaker is a 83 y.o. female with PMH of dementia, HLD, HTN, and IBS presents to the emergency department for evaluation after mechanical fall from her wheelchair.  Patient is currently in an assisted living.  Staff report that she was attempting to stand she fell forward landing on her knees.  She did not hit her head, by report.  No loss of consciousness.  They report that the patient was complaining of left shoulder pain, hip pain, knee pain.  She was given Haldol prior to EMS transport.  They report that she was having some hallucinations and snakes but that is normal for her.  Level 5 caveat: Dementia.   Past Medical History:  Diagnosis Date  . Acute asthmatic bronchitis   . Allergic rhinitis   . Alzheimer's dementia   . Anxiety   . Chronic constipation   . Diverticulosis of colon   . DJD (degenerative joint disease)   . GERD (gastroesophageal reflux disease)   . History of colonic polyps   . Hypercholesteremia   . Hypertension   . IBS (irritable bowel syndrome)   . Lacunar infarction   . Lumbar back pain   . Malnutrition ([place])   . UTI (lower urinary tract infection)   . Vitamin D deficiency     Patient Active Problem List   Diagnosis Date Noted  . Hallucinations   . Acute blood loss anemia 01/15/2017  . Hip fracture, left ([place]) 01/12/2017  . Hip fracture ([place]) 01/12/2017  . S/p left hip fracture 01/12/2017  . Dementia with psychosis 07/26/2016  . Dysuria 08/10/2015  . Hypothyroidism 06/06/2015  . Protein-calorie malnutrition, severe ([address]) 06/26/2014  . Constipation 12/17/2013  . COPD (chronic obstructive pulmonary disease) ([place]) 06/03/2013  . IBS (irritable bowel syndrome) 12/30/2012  . Generalized anxiety disorder 12/30/2012  . Dyslipidemia 12/12/2012  . HTN (hypertension) 12/12/2012  . Hx of completed stroke  07/11/2009    Past Surgical History:  Procedure Laterality Date  . ABDOMINAL HYSTERECTOMY      Current Outpatient Rx  . Order #: [id number] Class: Print  . Order #: [id number] Class: Historical Med  . Order #: [id number] Class: Historical Med  . Order #: [id number] Class: Historical Med  . Order #: [id number] Class: Print  . Order #: [id number] Class: Normal  . Order #: [id number] Class: Print  . Order #: [id number] Class: Print  . Order #: [id number] Class: Normal  . Order #: [id number] Class: Historical Med  . Order #: [id number] Class: Historical Med  . Order #: [id number] Class: Historical Med  . Order #: [id number] Class: Print    Allergies Demerol [meperidine]; Influenza vaccines; Morphine and related; Peanuts [peanut oil]; Prevpac [amoxicill-clarithro-lansopraz]; and Telithromycin  Family History  Problem Relation Age of Onset  . Hypertension Other     Social History Social History   Tobacco Use  . Smoking status: Never Smoker  . Smokeless tobacco: Never Used  Substance Use Topics  . Alcohol use: No    Alcohol/week: 0.0 oz  . Drug use: No    Review of Systems  Level 5 caveat: Dementia.   ____________________________________________   PHYSICAL EXAM:  VITAL SIGNS: ED Triage Vitals [04/30/17 1609]  Enc Vitals Group     BP (!) 188/100     Pulse Rate 88     Resp (!) 22     Temp 98.9 F (37.2 C)     Temp  Source Oral     SpO2 100 %   Constitutional: Alert and conversational but confused. At mental status baseline.  Eyes: Conjunctivae are normal. PERRL.  Head: Atraumatic. Nose: No congestion/rhinnorhea. Mouth/Throat: Mucous membranes are moist.   Neck: No stridor.  Cardiovascular: Normal rate, regular rhythm. Good peripheral circulation. Grossly normal heart sounds.   Respiratory: Normal respiratory effort.  No retractions. Lungs CTAB. Gastrointestinal: Soft and nontender. No distention.  Musculoskeletal: No lower extremity tenderness nor edema. No gross deformities of  extremities. Mild tenderness to palpation of the left shoulder. No bruising or deformity. Normal ROM of the B/L elbows and wrists. Mild discomfort with passive ROM of the left hip and knee. No bruising or deformity. No ankles discomfort to palpation.  Neurologic:  Normal speech and language. No gross focal neurologic deficits are appreciated.  Skin:  Skin is warm, dry and intact. No rash noted.  ____________________________________________  [id number]  Dg Shoulder Left  Result Date: 04/30/2017 CLINICAL DATA:  Left shoulder pain following fall. Initial encounter. EXAM: LEFT SHOULDER - 2+ VIEW COMPARISON:  05/28/2006 radiographs FINDINGS: No acute fracture, subluxation or dislocation identified. Mild degenerative changes at the glenohumeral and AC joints noted. No focal bony lesions are present. IMPRESSION: No acute abnormality. Electronically Signed   By: Margarette Canada M.D.   On: 04/30/2017 18:15   Dg Knee Complete 4 Views Left  Result Date: 04/30/2017 CLINICAL DATA:  Fall.  Left knee pain. EXAM: LEFT KNEE - COMPLETE 4+ VIEW COMPARISON:  None. FINDINGS: Partially visualized intramedullary rod in the distal left femoral shaft with no findings of hardware fracture or loosening on these views. No left knee osseous fracture. Small suprapatellar left knee joint effusion. No dislocation. Diffuse osteopenia. Meniscal chondrocalcinosis with mild medial and lateral compartment left knee osteoarthritis. No suspicious focal osseous lesions. IMPRESSION: 1. Small suprapatellar left knee joint effusion. No osseous fracture or malalignment in the left knee. 2. Meniscal chondrocalcinosis indicating CPPD arthropathy. Mild medial and lateral compartment left knee osteoarthritis. 3. Diffuse osteopenia. Electronically Signed   By: Ilona Sorrel M.D.   On: 04/30/2017 18:16   Dg Hip Unilat With Pelvis 2-3 Views Left  Result Date: 04/30/2017 CLINICAL DATA:  [place] from wheelchair. EXAM: DG HIP (WITH OR WITHOUT PELVIS) 2-3V LEFT  COMPARISON:  LEFT femur radiograph January 12, 2017 FINDINGS: There is no evidence of hip fracture or dislocation. LEFT femur intramedullary rod and femoral neck pinning for intertrochanteric fracture, no residual fracture line. No periprosthetic lucency though, hardware incompletely imaged. Osteopenia. There is no evidence of arthropathy or other focal bone abnormality. Moderate retained large bowel stool, incompletely evaluated. IMPRESSION: No acute fracture deformity or dislocation. Osteopenia, decreasing sensitivity for acute nondisplaced fractures. LEFT femur ORIF. Electronically Signed   By: Elon Alas M.D.   On: 04/30/2017 18:15    ____________________________________________   PROCEDURES  Procedure(s) performed:   Procedures  None ____________________________________________   INITIAL IMPRESSION / ASSESSMENT AND PLAN / ED COURSE  Pertinent labs & imaging results that were available during my care of the patient were reviewed by me and considered in my medical decision making (see chart for details).  Patient presents to the emergency department for evaluation after mechanical fall.  She is currently at an assisted living facility.  Not anticoagulated.  No evidence of head, neck, face trauma.  Patient having some hallucinations which is her baseline.  She has mild discomfort to the left shoulder, left hip, left knee.  Plan to obtain plain films of these areas.  The  fall appears to have been mechanical.  Family at bedside say they are working to elevate her level of care.   07:47 PM  Patient with negative plain films.  Case manager saw the patient and discussed with family.  They will try and facilitate placement from the patient's assisted living facility to a memory care unit.   At this time, I do not feel there is any life-threatening condition present. I have reviewed and discussed all results (EKG, imaging, lab, urine as appropriate), exam findings with patient. I have  reviewed nursing notes and appropriate previous records.  I feel the patient is safe to be discharged home without further emergent workup. Discussed usual and customary return precautions. Patient and family (if present) verbalize understanding and are comfortable with this plan.  Patient will follow-up with their primary care provider. If they do not have a primary care provider, information for follow-up has been provided to them. All questions have been answered.  ____________________________________________  FINAL CLINICAL IMPRESSION(S) / ED DIAGNOSES  Final diagnoses:  Fall, initial encounter  Acute pain of left shoulder  Left leg pain     MEDICATIONS GIVEN DURING THIS VISIT:  Medications - No data to display   Note:  This document was prepared using Dragon voice recognition software and may include unintentional dictation errors.  Nanda Quinton, MD Emergency Medicine    Juliene Kirsh, [facility], MD 05/01/17 [phone]

## 2017-05-09 DIAGNOSIS — W19XXXD Unspecified fall, subsequent encounter: Secondary | ICD-10-CM | POA: Diagnosis not present

## 2017-05-09 DIAGNOSIS — Z8673 Personal history of transient ischemic attack (TIA), and cerebral infarction without residual deficits: Secondary | ICD-10-CM | POA: Diagnosis not present

## 2017-05-09 DIAGNOSIS — Z9181 History of falling: Secondary | ICD-10-CM | POA: Diagnosis not present

## 2017-05-09 DIAGNOSIS — I10 Essential (primary) hypertension: Secondary | ICD-10-CM | POA: Diagnosis not present

## 2017-05-09 DIAGNOSIS — Z7982 Long term (current) use of aspirin: Secondary | ICD-10-CM | POA: Diagnosis not present

## 2017-05-09 DIAGNOSIS — M1990 Unspecified osteoarthritis, unspecified site: Secondary | ICD-10-CM | POA: Diagnosis not present

## 2017-05-09 DIAGNOSIS — J449 Chronic obstructive pulmonary disease, unspecified: Secondary | ICD-10-CM | POA: Diagnosis not present

## 2017-05-09 DIAGNOSIS — S72145D Nondisplaced intertrochanteric fracture of left femur, subsequent encounter for closed fracture with routine healing: Secondary | ICD-10-CM | POA: Diagnosis not present

## 2017-05-09 DIAGNOSIS — F0390 Unspecified dementia without behavioral disturbance: Secondary | ICD-10-CM | POA: Diagnosis not present

## 2017-05-09 DIAGNOSIS — F419 Anxiety disorder, unspecified: Secondary | ICD-10-CM

## 2017-08-03 IMAGING — CT CT HEAD W/O CM
2 series · 16 of 30 positions shown, 20 images · non-contrast
Comparison: CT scan of April 10, 2009.

CLINICAL DATA: Increased confusion.

EXAM:
CT HEAD WITHOUT CONTRAST
TECHNIQUE: Contiguous axial images were obtained from the base of the skull
through the vertex without intravenous contrast.

[Series 2: head w/o · axial · non-contrast · 0.38mm/px · z∈[+1276,+1396]mm · 13 of 29 slices shown, 17 images]
[im 3/29  brain]
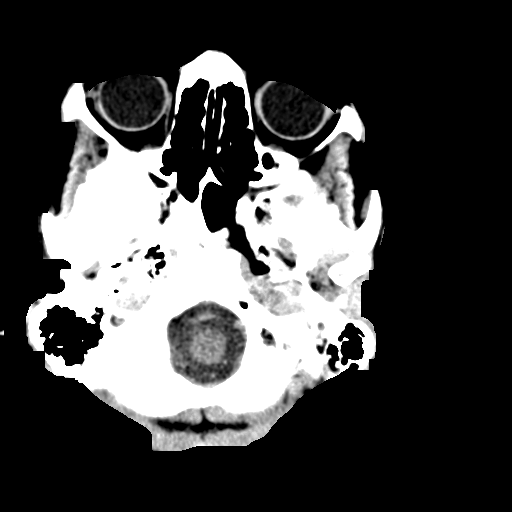
[im 3/29  bone]
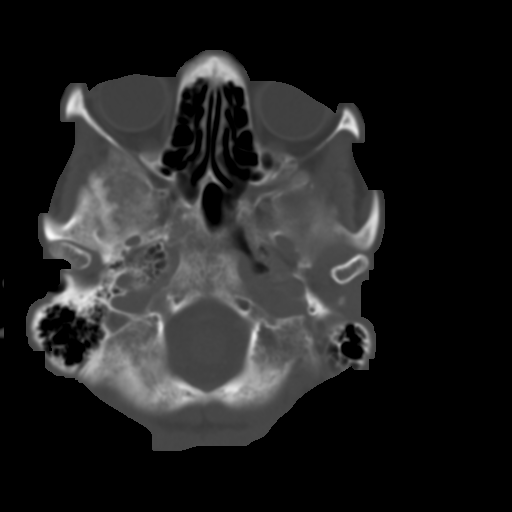
[im 5/29  brain]
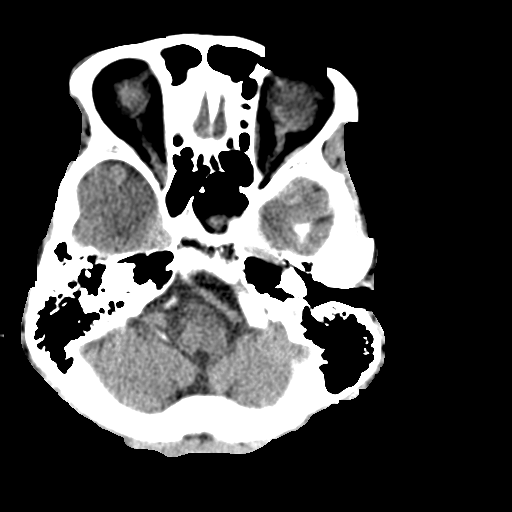
[im 7/29  brain]
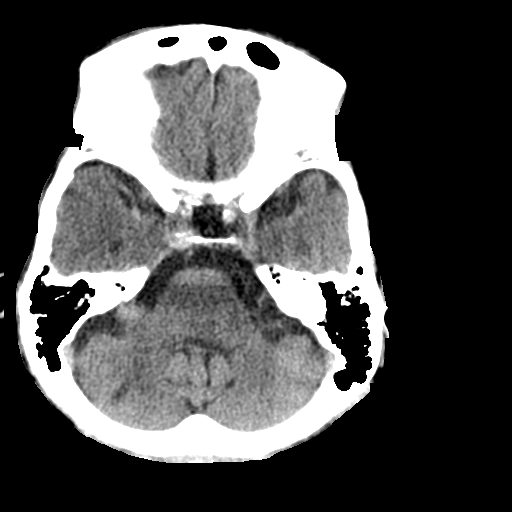
[im 9/29  brain]
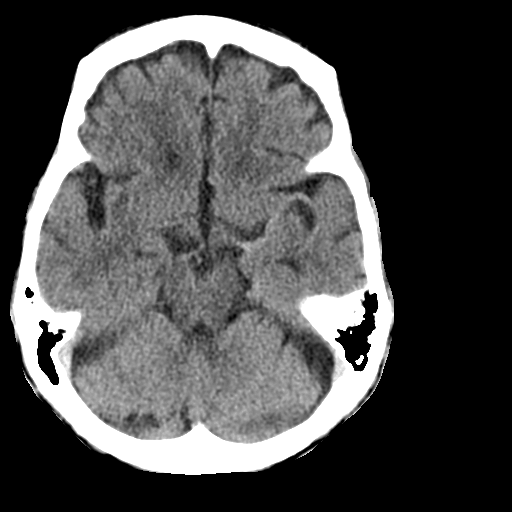
[im 11/29  brain]
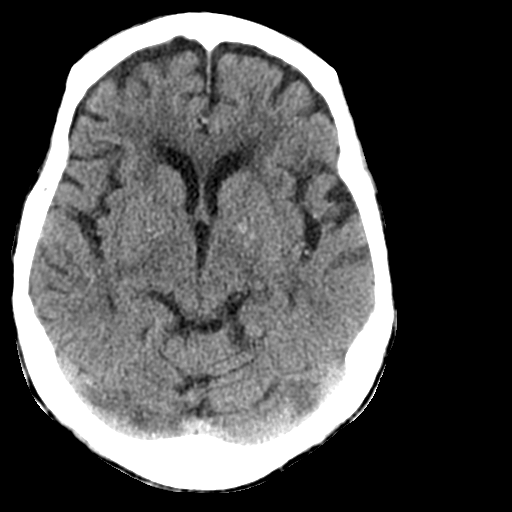
[im 11/29  bone]
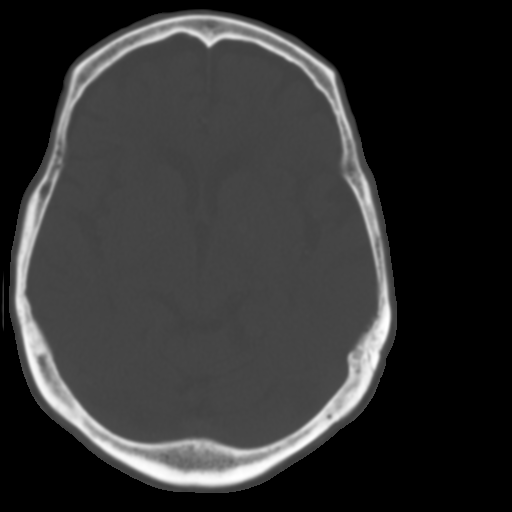
[im 13/29  brain]
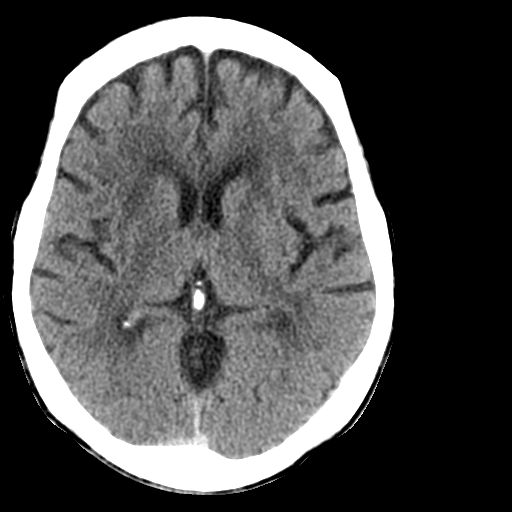
[im 15/29  brain]
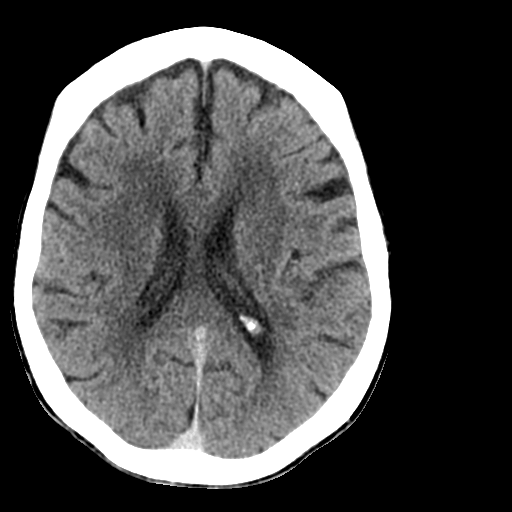
[im 17/29  brain]
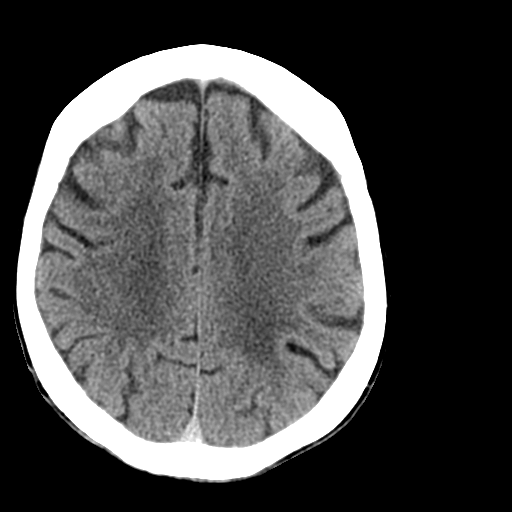
[im 19/29  brain]
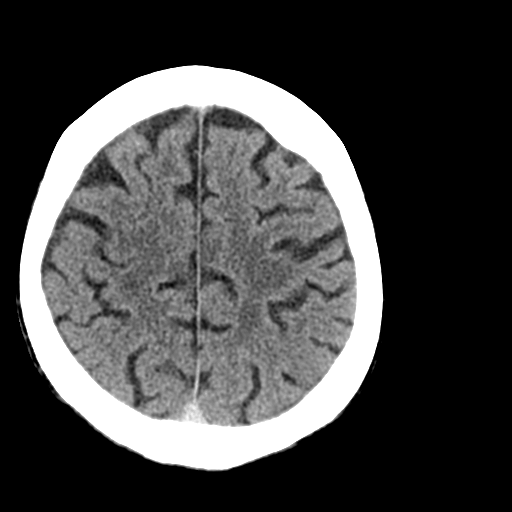
[im 19/29  bone]
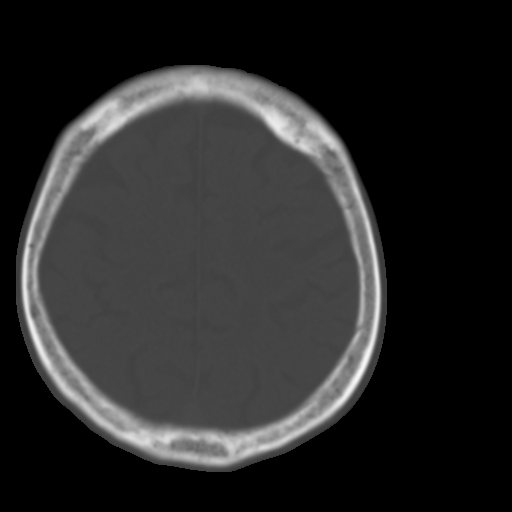
[im 21/29  brain]
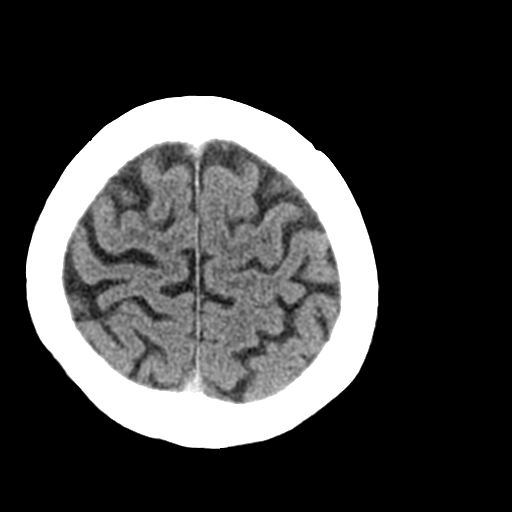
[im 23/29  brain]
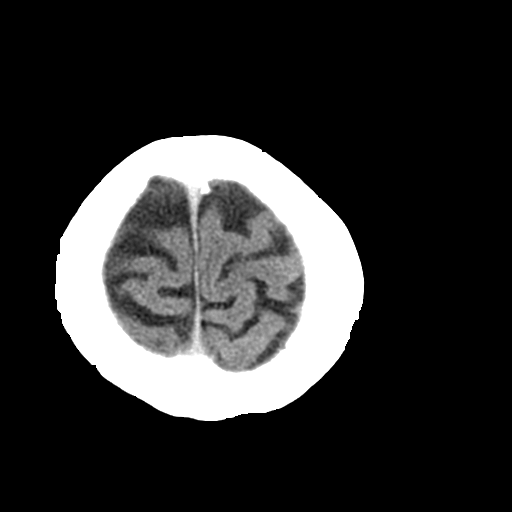
[im 25/29  brain]
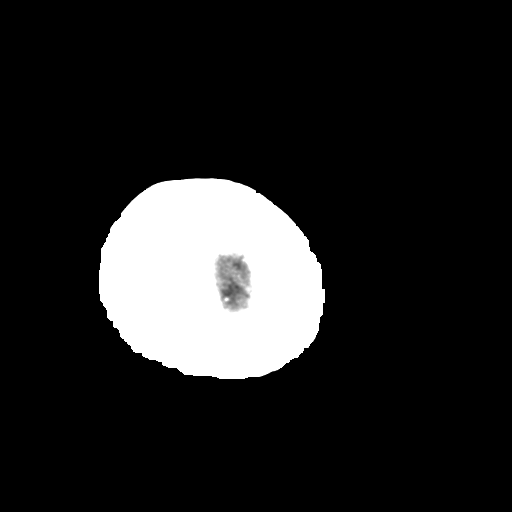
[im 27/29  brain]
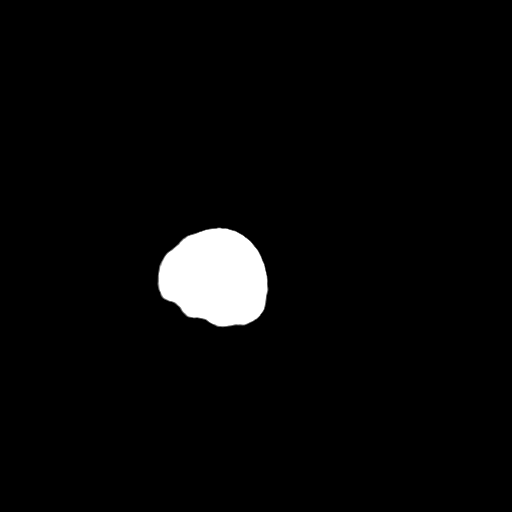
[im 27/29  bone]
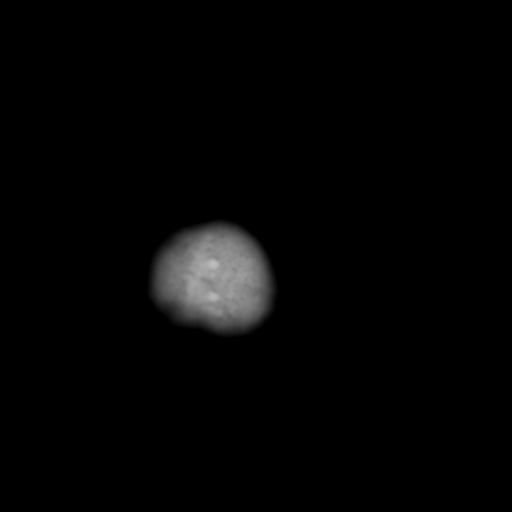

[Series 3: bone windows · axial · 0.38mm/px · z∈[+1276,+1316]mm · 3 of 29 slices shown]
[im 3/29  bone]
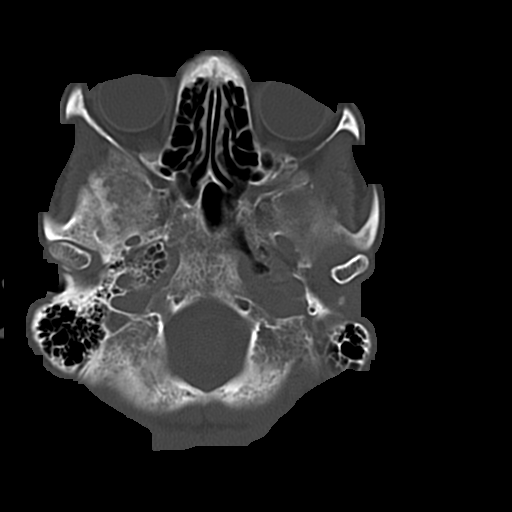
[im 7/29  bone]
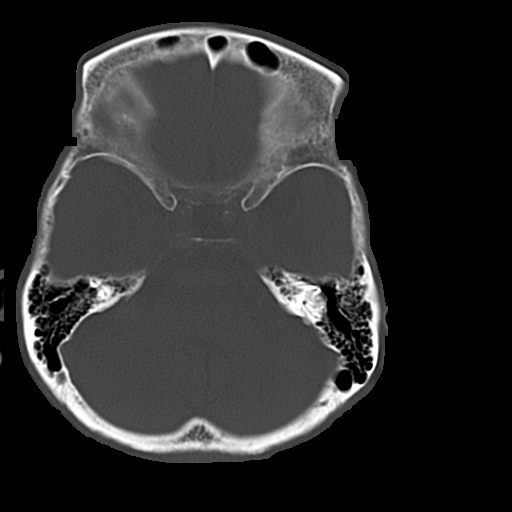
[im 11/29  bone]
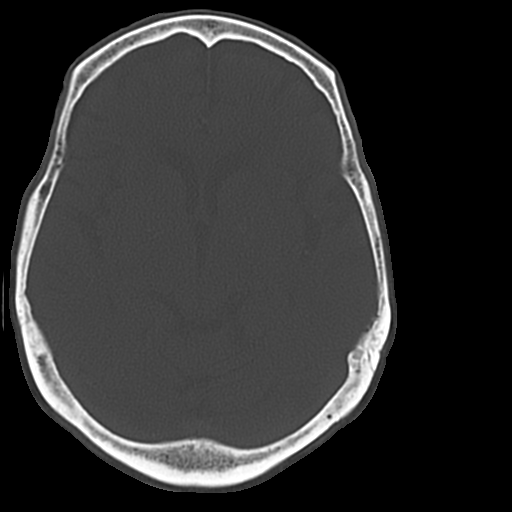

[16 of 30 positions shown; findings below may reference images not displayed]

FINDINGS: Bony calvarium appears intact. Mild diffuse cortical atrophy is
noted. Mild chronic ischemic white matter disease is noted. Old
right posterior cerebellar infarction is noted. No mass effect or
midline shift is noted. Ventricular size is within normal limits.
There is no evidence of mass lesion, hemorrhage or acute infarction.
IMPRESSION: Mild diffuse cortical atrophy. Mild chronic ischemic white matter
disease. Old right posterior cerebellar infarction. No acute
intracranial abnormality seen.

## 2017-08-03 IMAGING — CT CT ABD-PELV W/ CM
2 of 5 series · 16 of 46 positions shown, 18 images · IV contrast (OMNIPAQUE 300)
Comparison: CT 01/23/2015

CLINICAL DATA: Rectal pain for 1 week. Recent diagnosis of fecal
impaction

EXAM:
CT ABDOMEN AND PELVIS WITH CONTRAST
TECHNIQUE: Multidetector CT imaging of the abdomen and pelvis was performed
using the standard protocol following bolus administration of
intravenous contrast.
CONTRAST:  25mL OMNIPAQUE IOHEXOL 300 MG/ML SOLN, 80mL OMNIPAQUE
IOHEXOL 300 MG/ML SOLN

[Series 2: abd/pel with · axial · 0.59mm/px · z∈[-531,-166]mm · 13 of 83 slices shown, 15 images]
[im 5/83  soft-tissue]
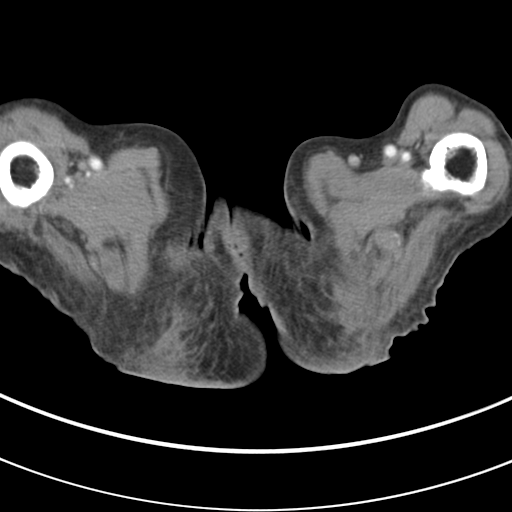
[im 5/83  bone]
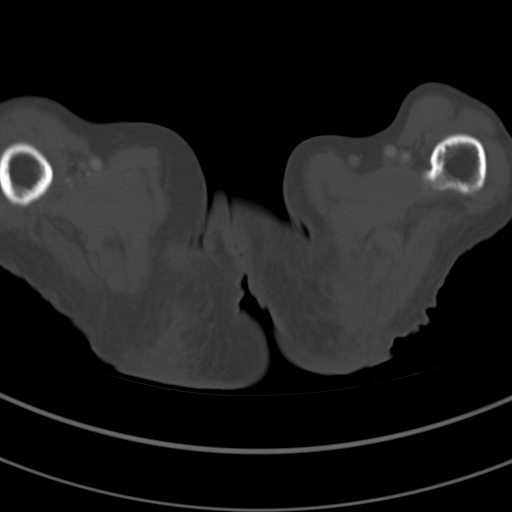
[im 13/83  soft-tissue]
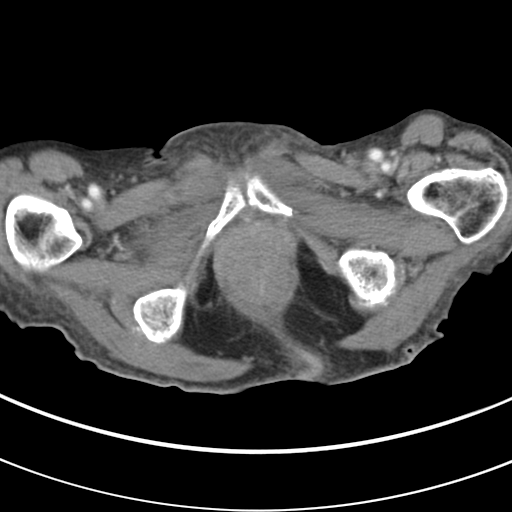
[im 17/83  soft-tissue]
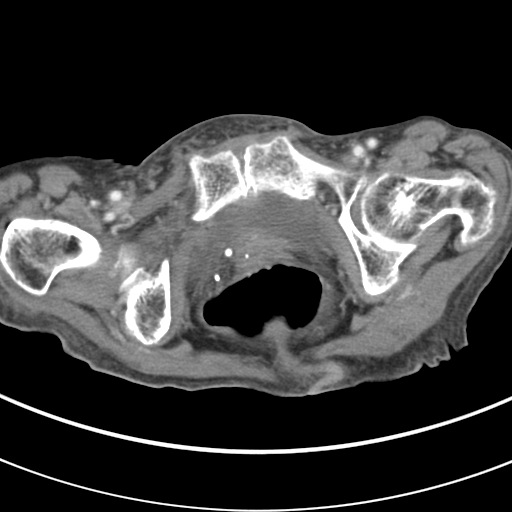
[im 25/83  soft-tissue]
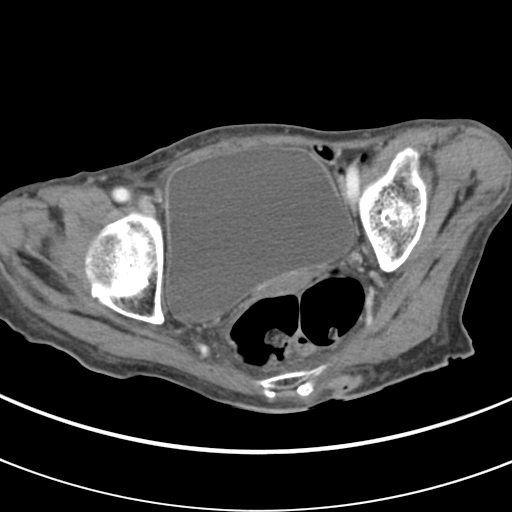
[im 29/83  soft-tissue]
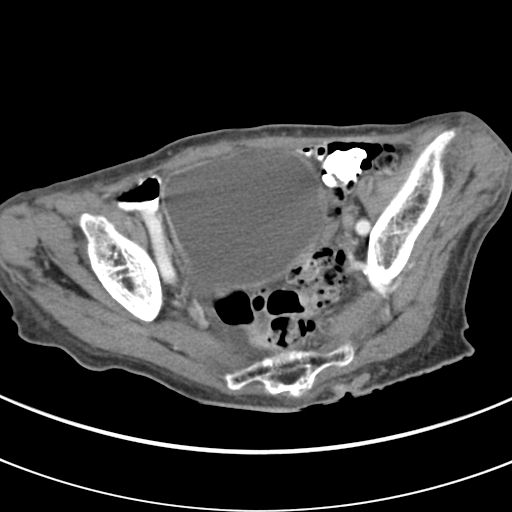
[im 37/83  soft-tissue]
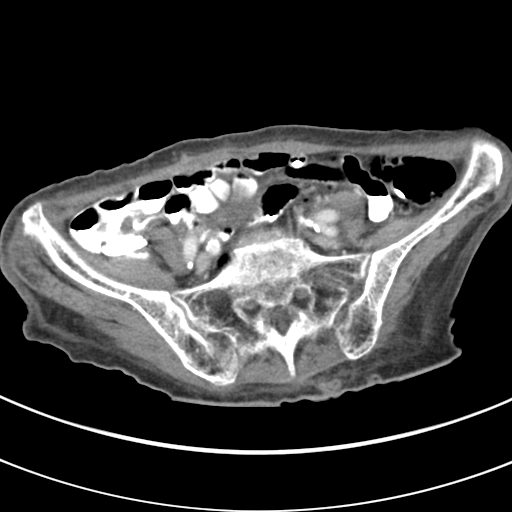
[im 42/83  soft-tissue]
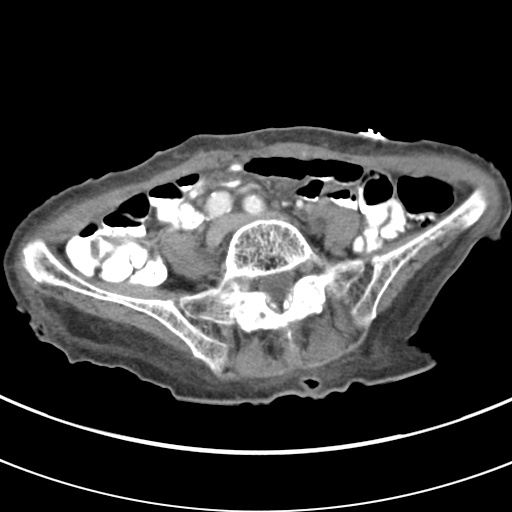
[im 46/83  soft-tissue]
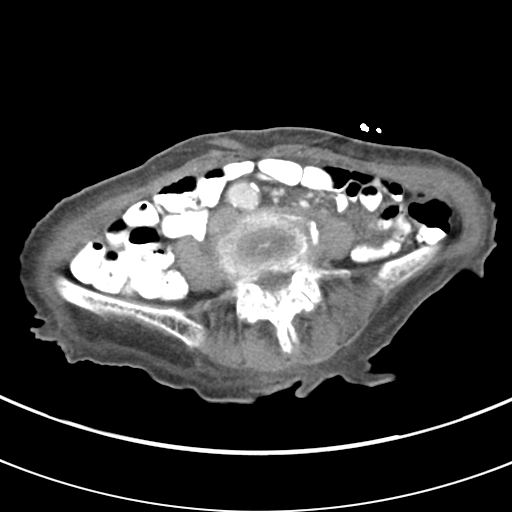
[im 54/83  soft-tissue]
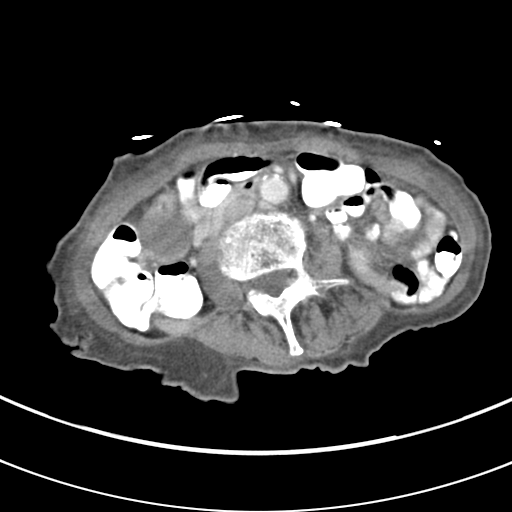
[im 54/83  bone]
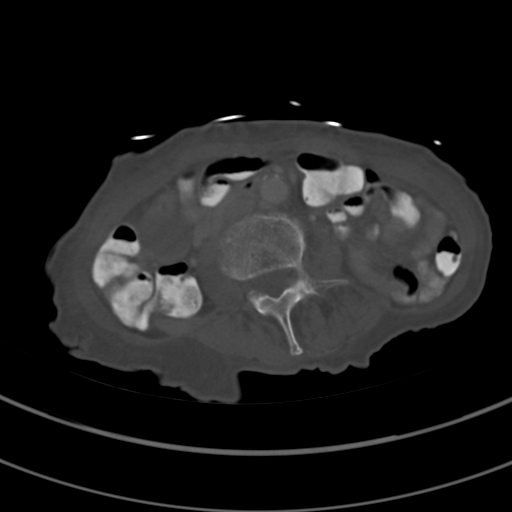
[im 58/83  soft-tissue]
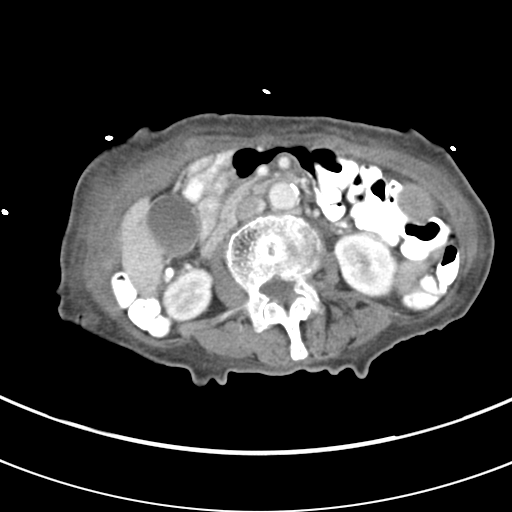
[im 66/83  soft-tissue]
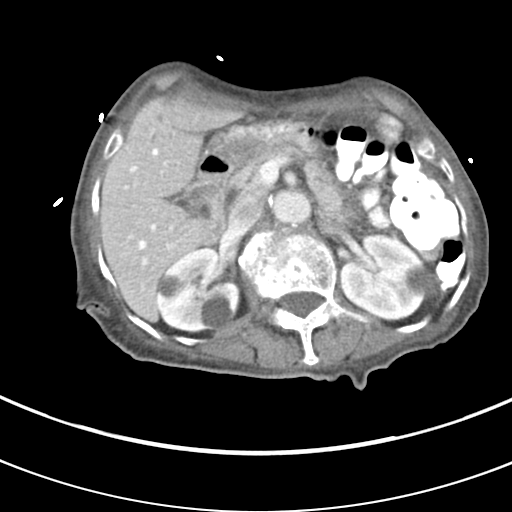
[im 70/83  soft-tissue]
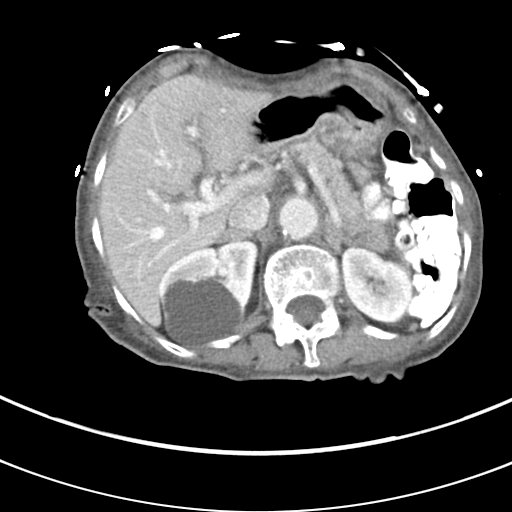
[im 78/83  soft-tissue]
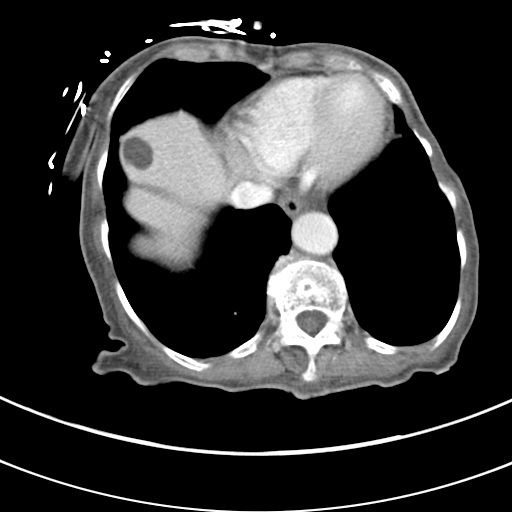

[Series 5: coronal a/|p · coronal · 0.57mm/px · 3 of 69 slices shown]
[im 23/69  soft-tissue]
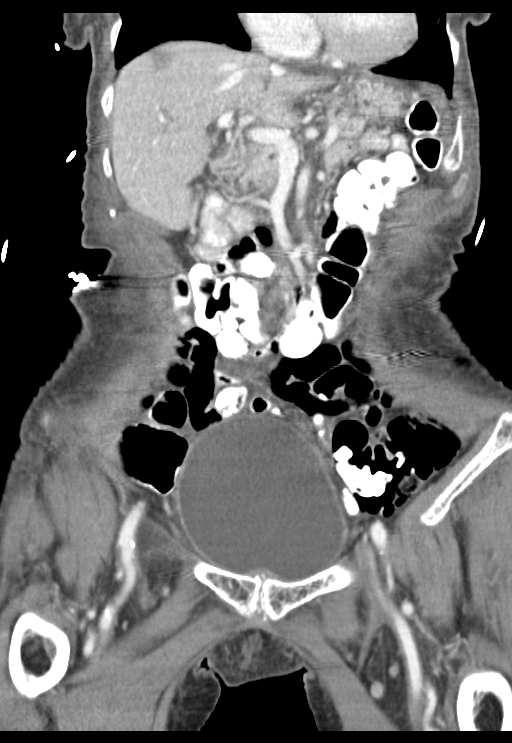
[im 31/69  soft-tissue]
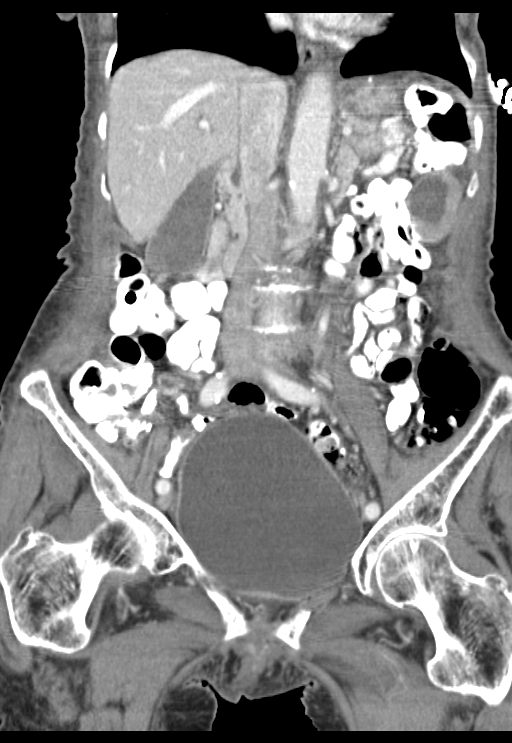
[im 38/69  soft-tissue]
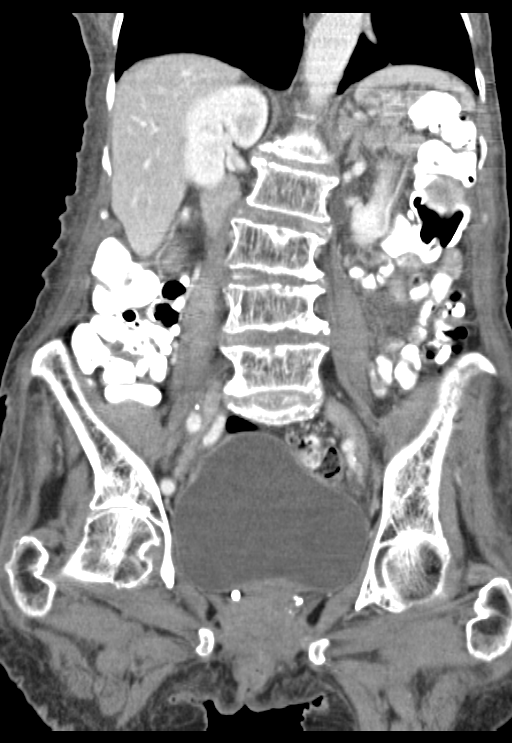

[16 of 46 positions shown; findings below may reference images not displayed]

FINDINGS: Lower chest: Lung bases are clear.

Hepatobiliary: RIGHT hepatic lobe cyst again noted no biliary duct
dilatation. Gallbladder normal. Common bile duct normal

Pancreas: Pancreas is normal. No ductal dilatation. No pancreatic
inflammation.

Spleen: Normal spleen

Adrenals/urinary tract: Adrenal glands normal. There bilateral
simple fluid attenuation rounded renal lesions. No obstructive
uropathy. Bladder normal.

Stomach/Bowel: Stomach, small bowel, appendix, and cecum are normal.
Several diverticula of the descending colon sigmoid colon. No
significant stool burden in the rectum. There is a thickening most
distal rectum / anus to 13 mm (image 70, series 2)

Vascular/Lymphatic: Abdominal aorta is normal caliber with
atherosclerotic calcification. There is no retroperitoneal or
periportal lymphadenopathy. No pelvic lymphadenopathy.

Reproductive: Post hysterectomy.

Other: No free fluid.

Musculoskeletal: No aggressive osseous lesion.
IMPRESSION: 1. No significant stool burden in the rectum.
2. Thickening of the anus vs distal rectum. Cannot exclude distal
proctitis. Majority of the rectum is normal.
3. Mild to moderate sigmoid diverticulosis without diverticulitis.

## 2017-08-31 ENCOUNTER — Other Ambulatory Visit: Payer: Self-pay

## 2017-08-31 ENCOUNTER — Emergency Department (HOSPITAL_COMMUNITY): Payer: Medicare Other

## 2017-08-31 ENCOUNTER — Encounter (HOSPITAL_COMMUNITY): Payer: Self-pay | Admitting: Emergency Medicine

## 2017-08-31 ENCOUNTER — Emergency Department (HOSPITAL_COMMUNITY)
Admission: EM | Admit: 2017-08-31 | Discharge: 2017-09-01 | Disposition: A | Discharge status: Home / Self Care | Payer: Medicare Other | Attending: Emergency Medicine | Admitting: Emergency Medicine

## 2017-08-31 DIAGNOSIS — F0390 Unspecified dementia without behavioral disturbance: Secondary | ICD-10-CM | POA: Diagnosis not present

## 2017-08-31 DIAGNOSIS — Z91010 Allergy to peanuts: Secondary | ICD-10-CM | POA: Insufficient documentation

## 2017-08-31 DIAGNOSIS — R404 Transient alteration of awareness: Secondary | ICD-10-CM | POA: Diagnosis not present

## 2017-08-31 DIAGNOSIS — Z79899 Other long term (current) drug therapy: Secondary | ICD-10-CM | POA: Insufficient documentation

## 2017-08-31 DIAGNOSIS — I10 Essential (primary) hypertension: Secondary | ICD-10-CM | POA: Insufficient documentation

## 2017-08-31 DIAGNOSIS — R4182 Altered mental status, unspecified: Secondary | ICD-10-CM | POA: Diagnosis present

## 2017-08-31 LAB — URINALYSIS, ROUTINE W REFLEX MICROSCOPIC
BACTERIA UA: NONE SEEN
Bilirubin Urine: NEGATIVE
GLUCOSE, UA: NEGATIVE mg/dL
KETONES UR: NEGATIVE mg/dL
NITRITE: NEGATIVE
PROTEIN: NEGATIVE mg/dL
Specific Gravity, Urine: 1.016 (ref 1.005–1.030)
pH: 5 (ref 5.0–8.0)

## 2017-08-31 LAB — CBC
HEMATOCRIT: 30.5 % — AB (ref 36.0–46.0)
HEMOGLOBIN: 9.3 g/dL — AB (ref 12.0–15.0)
MCH: 27.8 pg (ref 26.0–34.0)
MCHC: 30.5 g/dL (ref 30.0–36.0)
MCV: 91.3 fL (ref 78.0–100.0)
Platelets: 413 10*3/uL — ABNORMAL HIGH (ref 150–400)
RBC: 3.34 MIL/uL — ABNORMAL LOW (ref 3.87–5.11)
RDW: 15.3 % (ref 11.5–15.5)
WBC: 6.1 10*3/uL (ref 4.0–10.5)

## 2017-08-31 LAB — TROPONIN I: Troponin I: <0.03 ng/mL (ref <0.03)

## 2017-08-31 LAB — COMPREHENSIVE METABOLIC PANEL
ALBUMIN: 2.6 g/dL — AB (ref 3.5–5.0)
ALT: 12 U/L — ABNORMAL LOW (ref 14–54)
ANION GAP: 8 (ref 5–15)
AST: 17 U/L (ref 15–41)
Alkaline Phosphatase: 65 U/L (ref 38–126)
BILIRUBIN TOTAL: 0.5 mg/dL (ref 0.3–1.2)
BUN: 15 mg/dL (ref 6–20)
CO2: 27 mmol/L (ref 22–32)
Calcium: 9 mg/dL (ref 8.9–10.3)
Chloride: 102 mmol/L (ref 101–111)
Creatinine, Ser: 0.7 mg/dL (ref 0.44–1.00)
GFR calc Af Amer: >60 mL/min (ref >60)
GFR calc non Af Amer: >60 mL/min (ref >60)
GLUCOSE: 84 mg/dL (ref 65–99)
Potassium: 4.2 mmol/L (ref 3.5–5.1)
Sodium: 137 mmol/L (ref 135–145)
TOTAL PROTEIN: 6.9 g/dL (ref 6.5–8.1)

## 2017-08-31 NOTE — ED Notes (Signed)
[facility] called by Faduma Cho 

## 2017-08-31 NOTE — ED Notes (Signed)
Pt. Is more alert when spoken too. Pt is able to follow commands and turn on her own in the bed. Pt was able to engage in small conversation with this RN

## 2017-08-31 NOTE — ED Notes (Signed)
This RN spoke with Kathryn Whitaker, med tech from [facility] facility. There is no night nurse for at the facility. Report given to [facility], informed her the EDP spoke with pts son about pt condition and all the test results for this pt were negative.

## 2017-08-31 NOTE — ED Triage Notes (Signed)
Pt BIB EMS from [facility]. pt fell asleep after lunch and staff was unable to wake pt up per usual. Pt baseline A&Ox4. BP-146/82, HR-77. EMS EKG showed PACs and peaked T waves. CBG-78

## 2017-08-31 NOTE — Discharge Instructions (Signed)
Follow-up with your doctors as needed. 

## 2017-08-31 NOTE — ED Provider Notes (Signed)
[facility] Provider Note   CSN: [id number] Arrival date & time: 08/31/17  1529     History   Chief Complaint Chief Complaint  Patient presents with  . Altered Mental Status    HPI Kathryn Whitaker is a 84 y.o. female.  HPI Patient brought in from dementia facility.  Reportedly unresponsive after lunch.  Does have reported episodes of unresponsiveness is normally wakes up quicker.  Patient really not able to provide any history at this time. Past Medical History:  Diagnosis Date  . Acute asthmatic bronchitis   . Allergic rhinitis   . Alzheimer's dementia   . Anxiety   . Chronic constipation   . Diverticulosis of colon   . DJD (degenerative joint disease)   . GERD (gastroesophageal reflux disease)   . History of colonic polyps   . Hypercholesteremia   . Hypertension   . IBS (irritable bowel syndrome)   . Lacunar infarction   . Lumbar back pain   . Malnutrition ([place])   . UTI (lower urinary tract infection)   . Vitamin D deficiency     Patient Active Problem List   Diagnosis Date Noted  . Hallucinations   . Acute blood loss anemia 01/15/2017  . Hip fracture, left ([place]) 01/12/2017  . Hip fracture ([place]) 01/12/2017  . S/p left hip fracture 01/12/2017  . Dementia with psychosis 07/26/2016  . Dysuria 08/10/2015  . Hypothyroidism 06/06/2015  . Protein-calorie malnutrition, severe ([place]) 06/26/2014  . Constipation 12/17/2013  . COPD (chronic obstructive pulmonary disease) ([place]) 06/03/2013  . IBS (irritable bowel syndrome) 12/30/2012  . Generalized anxiety disorder 12/30/2012  . Dyslipidemia 12/12/2012  . HTN (hypertension) 12/12/2012  . Hx of completed stroke 07/11/2009    Past Surgical History:  Procedure Laterality Date  . ABDOMINAL HYSTERECTOMY    . FEMUR IM NAIL Left 01/13/2017   Procedure: TROCHANTERIC INTRAMEDULLARY (IM) NAIL FEMORAL;  Surgeon: Renette Butters, MD;  Location: [facility];  Service: Orthopedics;   Laterality: Left;    OB History    No data available       Home Medications    Prior to Admission medications   Medication Sig Start Date End Date Taking? Authorizing Provider  albuterol (PROVENTIL HFA) 108 (90 BASE) MCG/ACT inhaler Inhale 2 puffs into the lungs every 6 (six) hours as needed. For shortness of breath 06/27/14  Yes Delfina Redwood, MD  aspirin 81 MG chewable tablet Chew 81 mg by mouth daily.   Yes [provider]  famotidine (PEPCID) 20 MG tablet Take 1 tablet (20 mg total) by mouth 2 (two) times daily. For 30 days post op for gastroprotection. Patient taking differently: Take 20 mg by mouth daily.  01/14/17 01/14/18 Yes Martensen, Charna Elizabeth III, PA-C  feeding supplement (BOOST / RESOURCE BREEZE) LIQD Take 1 Container by mouth 3 (three) times daily between meals. Patient taking differently: Take 1 Container by mouth 3 (three) times daily between meals. Mighty shakes 01/15/17  Yes Short, Noah Delaine, MD  ferrous sulfate 325 (65 FE) MG tablet Take 325 mg by mouth daily with breakfast.   Yes [provider]  haloperidol (HALDOL) 2 MG/ML solution Take 1 mg by mouth 2 (two) times daily.   Yes [provider]  lamoTRIgine (LAMICTAL) 25 MG tablet Take 3 tablets (75 mg total) by mouth 2 (two) times daily. 04/08/17  Yes Ethelene Hal, NP  levothyroxine (SYNTHROID, LEVOTHROID) 50 MCG tablet TAKE 1 TABLET (50 MCG TOTAL) BY MOUTH DAILY  BEFORE BREAKFAST. 01/06/17  Yes Golden Circle, FNP  Lidocaine HCl (ASPERCREME LIDOCAINE) 4 % LIQD Apply 1 application topically 2 (two) times daily. Apply to R.knee   Yes [provider]  LORazepam (ATIVAN) 0.5 MG tablet Take 0.5 mg by mouth 2 (two) times daily as needed for anxiety.   Yes [provider]  Multiple Vitamins-Minerals (MULTIVITAMIN WITH MINERALS) tablet Take 1 tablet by mouth daily.   Yes [provider]  polyethylene glycol (MIRALAX / GLYCOLAX) packet Take 17 g by mouth daily.  Mix with 4-8 oz of fluid   Yes [provider]  traMADol (ULTRAM) 50 MG tablet Take 1 tablet (50 mg total) by mouth 2 (two) times daily as needed for moderate pain. 01/14/17  Yes Martensen, Charna Elizabeth III, PA-C  vitamin B-12 (CYANOCOBALAMIN) 250 MCG tablet Take 250 mcg by mouth daily.    Yes [provider]  budesonide-formoterol (SYMBICORT) 80-4.5 MCG/ACT inhaler Inhale 2 puffs into the lungs 2 (two) times daily as needed (wheezing).    [provider]  haloperidol (HALDOL) 0.5 MG tablet Take 1 tablet (0.5 mg total) by mouth 2 (two) times daily. Patient not taking: Reported on 08/31/2017 04/08/17   Ethelene Hal, NP    Family History Family History  Problem Relation Age of Onset  . Hypertension Other     Social History Social History   Tobacco Use  . Smoking status: Never Smoker  . Smokeless tobacco: Never Used  Substance Use Topics  . Alcohol use: No    Alcohol/week: 0.0 oz  . Drug use: No     Allergies   Demerol [meperidine]; Influenza vaccines; Morphine and related; Peanuts [peanut oil]; Prevpac [amoxicill-clarithro-lansopraz]; and Telithromycin   Review of Systems Review of Systems  Unable to perform ROS: Mental status change     Physical Exam Updated Vital Signs BP 127/66   Pulse 75   Temp (!) 96.9 F (36.1 C) (Rectal)   Resp 16   Ht 5\' 2"  (1.575 m)   Wt 43.1 kg (95 lb)   SpO2 98%   BMI 17.38 kg/m   Physical Exam  Constitutional: She appears well-developed.  HENT:  Head: Atraumatic.  Eyes:  Eyes held shut, however when opened pupils are reactive.  Will then look at me.  Neck: Neck supple.  Cardiovascular: Normal rate.  Pulmonary/Chest: She has no wheezes. She has no rales.  Abdominal: Soft. There is no tenderness.  Musculoskeletal: She exhibits no tenderness.  Neurological:  Patient is nonverbal.  Will not follow commands.  Breathing spontaneously.  Skin: Capillary refill takes less than 2 seconds.     ED  Treatments / Results  Labs (all labs ordered are listed, but only abnormal results are displayed) Labs Reviewed  COMPREHENSIVE METABOLIC PANEL - Abnormal; Notable for the following components:      Result Value   Albumin 2.6 (*)    ALT 12 (*)    All other components within normal limits  CBC - Abnormal; Notable for the following components:   RBC 3.34 (*)    Hemoglobin 9.3 (*)    HCT 30.5 (*)    Platelets 413 (*)    All other components within normal limits  URINALYSIS, ROUTINE W REFLEX MICROSCOPIC - Abnormal; Notable for the following components:   APPearance HAZY (*)    Hgb urine dipstick MODERATE (*)    Leukocytes, UA TRACE (*)    Squamous Epithelial / LPF 0-5 (*)    All other components within normal limits  TROPONIN I    EKG  EKG Interpretation  Date/Time:  Sunday August 31 2017 15:39:48 EDT Ventricular Rate:  77 PR Interval:    QRS Duration: 110 QT Interval:  414 QTC Calculation: 469 R Axis:   -56 Text Interpretation:  Sinus rhythm Atrial premature complexes Left anterior fascicular block Abnormal R-wave progression, early transition Probable left ventricular hypertrophy Confirmed by Davonna Belling [phone]) on 08/31/2017 3:44:31 PM       Radiology Ct Head Wo Contrast  Result Date: 08/31/2017 CLINICAL DATA:  Altered mental status EXAM: CT HEAD WITHOUT CONTRAST TECHNIQUE: Contiguous axial images were obtained from the base of the skull through the vertex without intravenous contrast. COMPARISON:  Head CT dated 04/01/2017 FINDINGS: Brain: Generalized age related parenchymal atrophy with commensurate dilatation of the ventricles and sulci. Chronic small vessel ischemic changes again noted within the bilateral periventricular and subcortical white matter regions. Additional chronic small vessel ischemic changes within the basal ganglia regions bilaterally. No mass, hemorrhage, edema or other evidence of acute parenchymal abnormality. No extra-axial hemorrhage. Vascular:  There are chronic calcified atherosclerotic changes of the large vessels at the skull base. No unexpected hyperdense vessel. Skull: Normal. Negative for fracture or focal lesion. Sinuses/Orbits: No acute finding. Other: None. IMPRESSION: 1. No acute findings.  No intracranial mass, hemorrhage or edema. 2. Chronic ischemic changes, as detailed above. Electronically Signed   By: Franki Cabot M.D.   On: 08/31/2017 16:49   Dg Chest Portable 1 View  Result Date: 08/31/2017 CLINICAL DATA:  Altered mental status. EXAM: PORTABLE CHEST 1 VIEW COMPARISON:  Chest x-ray dated April 07, 2017. FINDINGS: Stable mild cardiomegaly. Normal pulmonary vascularity. Unchanged tortuous thoracic aorta. No focal consolidation, pleural effusion, or pneumothorax. No acute osseous abnormality. IMPRESSION: No active disease. Electronically Signed   By: Titus Dubin M.D.   On: 08/31/2017 16:08    Procedures Procedures (including critical care time)  Medications Ordered in ED Medications - No data to display   Initial Impression / Assessment and Plan / ED Course  I have reviewed the triage vital signs and the nursing notes.  Pertinent labs & imaging results that were available during my care of the patient were reviewed by me and considered in my medical decision making (see chart for details).     Patient was unresponsive upon arrival.  Baseline alert and oriented.  Lab work reassuring.  No clear cause of mental status changes.  Mental status is gradually improved.  Has been able to speak.  Will discharge home back to her dementia facility.  Final Clinical Impressions(s) / ED Diagnoses   Final diagnoses:  Transient alteration of awareness    ED Discharge Orders    None       Davonna Belling, MD 08/31/17 2055

## 2017-08-31 NOTE — ED Notes (Signed)
Gave pt orange juice and apple juice.

## 2017-08-31 NOTE — ED Notes (Signed)
EDP spoke with pts son about patient condition and plan to be discharged back to facility.

## 2017-08-31 NOTE — ED Notes (Signed)
I&O cath attempt twice, unsuccessful. purwick applied

## 2017-09-24 ENCOUNTER — Telehealth: Payer: Self-pay | Admitting: Internal Medicine

## 2017-09-24 NOTE — Telephone Encounter (Signed)
Copied from [facility] [place] [phone]. Topic: Quick Communication - See Telephone Encounter >> Sep 24, 2017  3:06 PM Ivar Drape wrote: CRM for notification. See Telephone encounter for: 09/24/17. Patient's POA, Wyvonnia Dusky Victoria, [phone], [address], [place], [place] [address], would like a statement in writing supporting the fact that the patient has Dementia.  Patient's insurance company for her to get Medicaid is requesting something in writing.  He said it can be mailed to him.

## 2017-09-25 NOTE — Telephone Encounter (Signed)
Letter has been mailed.

## 2018-07-08 ENCOUNTER — Other Ambulatory Visit: Payer: Self-pay

## 2018-07-08 ENCOUNTER — Emergency Department (HOSPITAL_COMMUNITY)
Admission: EM | Admit: 2018-07-08 | Discharge: 2018-07-08 | Disposition: A | Discharge status: Home / Self Care | Payer: Medicare Other | Attending: Emergency Medicine | Admitting: Emergency Medicine

## 2018-07-08 ENCOUNTER — Emergency Department (HOSPITAL_COMMUNITY): Payer: Medicare Other

## 2018-07-08 ENCOUNTER — Encounter (HOSPITAL_COMMUNITY): Payer: Self-pay | Admitting: Emergency Medicine

## 2018-07-08 DIAGNOSIS — Y999 Unspecified external cause status: Secondary | ICD-10-CM | POA: Insufficient documentation

## 2018-07-08 DIAGNOSIS — Z7982 Long term (current) use of aspirin: Secondary | ICD-10-CM | POA: Insufficient documentation

## 2018-07-08 DIAGNOSIS — J449 Chronic obstructive pulmonary disease, unspecified: Secondary | ICD-10-CM | POA: Insufficient documentation

## 2018-07-08 DIAGNOSIS — Y92122 Bedroom in nursing home as the place of occurrence of the external cause: Secondary | ICD-10-CM | POA: Insufficient documentation

## 2018-07-08 DIAGNOSIS — I10 Essential (primary) hypertension: Secondary | ICD-10-CM | POA: Insufficient documentation

## 2018-07-08 DIAGNOSIS — S0181XA Laceration without foreign body of other part of head, initial encounter: Secondary | ICD-10-CM | POA: Insufficient documentation

## 2018-07-08 DIAGNOSIS — G309 Alzheimer's disease, unspecified: Secondary | ICD-10-CM | POA: Diagnosis not present

## 2018-07-08 DIAGNOSIS — W06XXXA Fall from bed, initial encounter: Secondary | ICD-10-CM | POA: Diagnosis not present

## 2018-07-08 DIAGNOSIS — Z79899 Other long term (current) drug therapy: Secondary | ICD-10-CM | POA: Diagnosis not present

## 2018-07-08 DIAGNOSIS — Y939 Activity, unspecified: Secondary | ICD-10-CM | POA: Insufficient documentation

## 2018-07-08 DIAGNOSIS — Z91010 Allergy to peanuts: Secondary | ICD-10-CM | POA: Insufficient documentation

## 2018-07-08 DIAGNOSIS — S0990XA Unspecified injury of head, initial encounter: Secondary | ICD-10-CM | POA: Diagnosis present

## 2018-07-08 DIAGNOSIS — S8001XA Contusion of right knee, initial encounter: Secondary | ICD-10-CM | POA: Insufficient documentation

## 2018-07-08 DIAGNOSIS — S01111A Laceration without foreign body of right eyelid and periocular area, initial encounter: Secondary | ICD-10-CM | POA: Insufficient documentation

## 2018-07-08 MED ORDER — LIDOCAINE-EPINEPHRINE (PF) 2 %-1:200000 IJ SOLN
5.0000 mL | Freq: Once | INTRAMUSCULAR | Status: AC
  Administered 2018-07-08: 5 mL
  Filled 2018-07-08: qty 20

## 2018-07-08 NOTE — Discharge Instructions (Addendum)
Sutures out in 5 days

## 2018-07-08 NOTE — ED Notes (Signed)
Attempted to call reports to [facility] but no one answered, will try again in a little while.

## 2018-07-08 NOTE — ED Provider Notes (Signed)
[facility] DEPT Provider Note   CSN: [id number] Arrival date & time: 07/08/18  2458     History   Chief Complaint Chief Complaint  Patient presents with  . Fall  . Head Laceration  . Knee Injury    HPI Kathryn Whitaker is a 85 y.o. female.  85 year old female here for mechanical fall just prior to arrival.  She attempted to get out of bed without calling for help and fell onto the floor.  Does not take any aspirin or blood thinners.  Sustained injury to her right forehead as well as right knee.  Denies any neck, back or chest discomfort.  Denies any hip or right ankle pain.  EMS called and patient transported here     Past Medical History:  Diagnosis Date  . Acute asthmatic bronchitis   . Allergic rhinitis   . Alzheimer's dementia ([place])   . Anxiety   . Chronic constipation   . Diverticulosis of colon   . DJD (degenerative joint disease)   . GERD (gastroesophageal reflux disease)   . History of colonic polyps   . Hypercholesteremia   . Hypertension   . IBS (irritable bowel syndrome)   . Lacunar infarction ([place])   . Lumbar back pain   . Malnutrition ([place])   . UTI (lower urinary tract infection)   . Vitamin D deficiency     Patient Active Problem List   Diagnosis Date Noted  . Hallucinations   . Acute blood loss anemia 01/15/2017  . Hip fracture, left ([place]) 01/12/2017  . Hip fracture ([place]) 01/12/2017  . S/p left hip fracture 01/12/2017  . Dementia with psychosis ([place]) 07/26/2016  . Dysuria 08/10/2015  . Hypothyroidism 06/06/2015  . Protein-calorie malnutrition, severe ([place]) 06/26/2014  . Constipation 12/17/2013  . COPD (chronic obstructive pulmonary disease) ([place]) 06/03/2013  . IBS (irritable bowel syndrome) 12/30/2012  . Generalized anxiety disorder 12/30/2012  . Dyslipidemia 12/12/2012  . HTN (hypertension) 12/12/2012  . Hx of completed stroke 07/11/2009    Past Surgical History:  Procedure Laterality Date  . ABDOMINAL  HYSTERECTOMY    . FEMUR IM NAIL Left 01/13/2017   Procedure: TROCHANTERIC INTRAMEDULLARY (IM) NAIL FEMORAL;  Surgeon: Renette Butters, MD;  Location: [facility];  Service: Orthopedics;  Laterality: Left;     OB History   No obstetric history on file.      Home Medications    Prior to Admission medications   Medication Sig Start Date End Date Taking? Authorizing Provider  albuterol (PROVENTIL HFA) 108 (90 BASE) MCG/ACT inhaler Inhale 2 puffs into the lungs every 6 (six) hours as needed. For shortness of breath 06/27/14   Delfina Redwood, MD  aspirin 81 MG chewable tablet Chew 81 mg by mouth daily.    [provider]  budesonide-formoterol (SYMBICORT) 80-4.5 MCG/ACT inhaler Inhale 2 puffs into the lungs 2 (two) times daily as needed (wheezing).    [provider]  famotidine (PEPCID) 20 MG tablet Take 1 tablet (20 mg total) by mouth 2 (two) times daily. For 30 days post op for gastroprotection. Patient taking differently: Take 20 mg by mouth daily.  01/14/17 01/14/18  Prudencio Burly III, PA-C  feeding supplement (BOOST / RESOURCE BREEZE) LIQD Take 1 Container by mouth 3 (three) times daily between meals. Patient taking differently: Take 1 Container by mouth 3 (three) times daily between meals. Mighty shakes 01/15/17   Janece Canterbury, MD  ferrous sulfate 325 (65 FE) MG tablet Take 325 mg  by mouth daily with breakfast.    [provider]  haloperidol (HALDOL) 0.5 MG tablet Take 1 tablet (0.5 mg total) by mouth 2 (two) times daily. Patient not taking: Reported on 08/31/2017 04/08/17   Ethelene Hal, NP  haloperidol (HALDOL) 2 MG/ML solution Take 1 mg by mouth 2 (two) times daily.    [provider]  lamoTRIgine (LAMICTAL) 25 MG tablet Take 3 tablets (75 mg total) by mouth 2 (two) times daily. 04/08/17   Ethelene Hal, NP  levothyroxine (SYNTHROID, LEVOTHROID) 50 MCG tablet TAKE 1 TABLET (50 MCG TOTAL) BY MOUTH DAILY BEFORE BREAKFAST.  01/06/17   Golden Circle, FNP  Lidocaine HCl (ASPERCREME LIDOCAINE) 4 % LIQD Apply 1 application topically 2 (two) times daily. Apply to R.knee    [provider]  LORazepam (ATIVAN) 0.5 MG tablet Take 0.5 mg by mouth 2 (two) times daily as needed for anxiety.    [provider]  Multiple Vitamins-Minerals (MULTIVITAMIN WITH MINERALS) tablet Take 1 tablet by mouth daily.    [provider]  polyethylene glycol (MIRALAX / GLYCOLAX) packet Take 17 g by mouth daily. Mix with 4-8 oz of fluid    [provider]  traMADol (ULTRAM) 50 MG tablet Take 1 tablet (50 mg total) by mouth 2 (two) times daily as needed for moderate pain. 01/14/17   Prudencio Burly III, PA-C  vitamin B-12 (CYANOCOBALAMIN) 250 MCG tablet Take 250 mcg by mouth daily.     [provider]    Family History Family History  Problem Relation Age of Onset  . Hypertension Other     Social History Social History   Tobacco Use  . Smoking status: Never Smoker  . Smokeless tobacco: Never Used  Substance Use Topics  . Alcohol use: No    Alcohol/week: 0.0 standard drinks  . Drug use: No     Allergies   Demerol [meperidine]; Influenza vaccines; Morphine and related; Peanuts [peanut oil]; Prevpac [amoxicill-clarithro-lansopraz]; and Telithromycin   Review of Systems Review of Systems  All other systems reviewed and are negative.    Physical Exam Updated Vital Signs BP (!) 198/100 (BP Location: Left Arm)   Pulse 83   Temp (!) 97.5 F (36.4 C) (Oral)   Resp 16   SpO2 96%   Physical Exam Vitals signs and nursing note reviewed.  Constitutional:      General: She is not in acute distress.    Appearance: Normal appearance. She is well-developed. She is not toxic-appearing.  HENT:     Head: Contusion and laceration present.   Eyes:     General: Lids are normal.     Conjunctiva/sclera: Conjunctivae normal.     Pupils: Pupils are equal, round, and reactive to  light.  Neck:     Musculoskeletal: Normal range of motion and neck supple.     Thyroid: No thyroid mass.     Trachea: No tracheal deviation.  Cardiovascular:     Rate and Rhythm: Normal rate and regular rhythm.     Heart sounds: Normal heart sounds. No murmur. No gallop.   Pulmonary:     Effort: Pulmonary effort is normal. No respiratory distress.     Breath sounds: Normal breath sounds. No stridor. No decreased breath sounds, wheezing, rhonchi or rales.  Abdominal:     General: Bowel sounds are normal. There is no distension.     Palpations: Abdomen is soft.     Tenderness: There is no abdominal tenderness. There is  no rebound.  Musculoskeletal:        General: No tenderness.     Right knee: She exhibits decreased range of motion, swelling and ecchymosis.       Legs:  Skin:    General: Skin is warm and dry.     Findings: No abrasion or rash.  Neurological:     Mental Status: She is alert and oriented to person, place, and time.     GCS: GCS eye subscore is 4. GCS verbal subscore is 5. GCS motor subscore is 6.     Cranial Nerves: No cranial nerve deficit.     Sensory: No sensory deficit.  Psychiatric:        Attention and Perception: Attention normal.      ED Treatments / Results  Labs (all labs ordered are listed, but only abnormal results are displayed) Labs Reviewed - No data to display  EKG None  Radiology No results found.  Procedures Procedures (including critical care time)  Medications Ordered in ED Medications - No data to display   Initial Impression / Assessment and Plan / ED Course  I have reviewed the triage vital signs and the nursing notes.  Pertinent labs & imaging results that were available during my care of the patient were reviewed by me and considered in my medical decision making (see chart for details).     Laceration repaired by physician assistant please see her note.  Right knee x-ray negative.  CT head and cervical spine without  acute injury.  Patient did complain of having bilateral heel pain.  I do not see any evidence of trauma.  Do not feel that imaging is indicated at this time.  She was only mildly tender to palpation.  Her ankles were not tender as well.  Patient will be discharged with return precautions and follow-up instructions  Final Clinical Impressions(s) / ED Diagnoses   Final diagnoses:  None    ED Discharge Orders    None       Lacretia Leigh, MD 07/08/18 1418

## 2018-07-08 NOTE — ED Triage Notes (Signed)
Pt arrived by ems from NH, pt attempted to get out of bed without calling for help, fell on floor injuring her R forehead, two lacerations, bleeding controlled upon arrival, pt with edema at site, pt c/o pain at site, pt with R knee swelling and pain, bruising. Pt alert, oriented to self and situation. Pt on ASA but no other blood thinners.

## 2018-07-08 NOTE — ED Notes (Signed)
[facility] called for transport.  

## 2018-07-08 NOTE — ED Notes (Signed)
Report called to staff at [facility]

## 2018-07-08 NOTE — ED Notes (Signed)
Bed: QF90 Expected date:  Expected time:  Means of arrival:  Comments: EMS/fall/head lac.

## 2018-07-08 NOTE — ED Provider Notes (Signed)
..  Laceration Repair Date/Time: 07/08/2018 1:48 PM Performed by: Latoyia Tecson, [place] N, PA-C Authorized by: Yamilex Borgwardt, [place] N, PA-C   Consent:    Consent obtained:  Verbal   Consent given by:  Patient   Risks discussed:  Infection, pain and poor cosmetic result   Alternatives discussed:  No treatment Anesthesia (see MAR for exact dosages):    Anesthesia method:  Local infiltration   Local anesthetic:  Lidocaine 2% WITH epi Laceration details:    Location:  Face   Face location:  R eyebrow   Length (cm):  1.5 Repair type:    Repair type:  Simple Pre-procedure details:    Preparation:  Patient was prepped and draped in usual sterile fashion Exploration:    Hemostasis achieved with:  Direct pressure   Wound exploration: entire depth of wound probed and visualized     Wound extent: no foreign bodies/material noted     Contaminated: no   Treatment:    Area cleansed with:  Saline Skin repair:    Repair method:  Sutures   Suture size:  6-0   Suture material:  Prolene   Suture technique:  Simple interrupted   Number of sutures:  4 Approximation:    Approximation:  Close Post-procedure details:    Dressing:  Non-adherent dressing   Patient tolerance of procedure:  Tolerated well, no immediate complications .Marland KitchenLaceration Repair Date/Time: 07/08/2018 1:49 PM Performed by: Cedrik Heindl, [place] N, PA-C Authorized by: Zacheriah Stumpe, [place] N, PA-C   Consent:    Consent obtained:  Verbal   Consent given by:  Patient   Risks discussed:  Pain, infection and poor cosmetic result   Alternatives discussed:  No treatment Anesthesia (see MAR for exact dosages):    Anesthesia method:  Local infiltration   Local anesthetic:  Lidocaine 2% WITH epi Laceration details:    Location:  Face   Face location:  Forehead   Length (cm):  1 Repair type:    Repair type:  Simple Pre-procedure details:    Preparation:  Patient was prepped and draped in usual sterile fashion Exploration:    Hemostasis achieved  with:  Direct pressure   Wound exploration: entire depth of wound probed and visualized     Wound extent: no foreign bodies/material noted     Contaminated: no   Treatment:    Area cleansed with:  Saline   Visualized foreign bodies/material removed: no   Skin repair:    Repair method:  Sutures   Suture size:  6-0   Suture material:  Prolene   Suture technique:  Simple interrupted   Number of sutures:  1 Approximation:    Approximation:  Close Post-procedure details:    Dressing:  Non-adherent dressing   Patient tolerance of procedure:  Tolerated well, no immediate complications   Laceration repair performed. See Dr. Loni Muse Allen's note for full HPI and workup.   Shayde Gervacio, [place] N, PA-C 07/08/18 1350    Lacretia Leigh, MD 07/08/18 1419

## 2019-04-27 ENCOUNTER — Emergency Department (HOSPITAL_COMMUNITY): Payer: Medicare Other

## 2019-04-27 ENCOUNTER — Observation Stay (HOSPITAL_COMMUNITY)
Admission: EM | Admit: 2019-04-27 | Discharge: 2019-04-28 | Disposition: A | Discharge status: Skilled Nursing Facility | Payer: Medicare Other | Attending: Internal Medicine | Admitting: Internal Medicine

## 2019-04-27 ENCOUNTER — Encounter (HOSPITAL_COMMUNITY): Payer: Self-pay | Admitting: Radiology

## 2019-04-27 DIAGNOSIS — Z8249 Family history of ischemic heart disease and other diseases of the circulatory system: Secondary | ICD-10-CM | POA: Diagnosis not present

## 2019-04-27 DIAGNOSIS — I6381 Other cerebral infarction due to occlusion or stenosis of small artery: Secondary | ICD-10-CM | POA: Insufficient documentation

## 2019-04-27 DIAGNOSIS — I6782 Cerebral ischemia: Secondary | ICD-10-CM | POA: Diagnosis not present

## 2019-04-27 DIAGNOSIS — Z91010 Allergy to peanuts: Secondary | ICD-10-CM | POA: Diagnosis not present

## 2019-04-27 DIAGNOSIS — Z880 Allergy status to penicillin: Secondary | ICD-10-CM | POA: Insufficient documentation

## 2019-04-27 DIAGNOSIS — Z8673 Personal history of transient ischemic attack (TIA), and cerebral infarction without residual deficits: Secondary | ICD-10-CM | POA: Insufficient documentation

## 2019-04-27 DIAGNOSIS — Z79899 Other long term (current) drug therapy: Secondary | ICD-10-CM | POA: Insufficient documentation

## 2019-04-27 DIAGNOSIS — J449 Chronic obstructive pulmonary disease, unspecified: Secondary | ICD-10-CM | POA: Insufficient documentation

## 2019-04-27 DIAGNOSIS — Z20828 Contact with and (suspected) exposure to other viral communicable diseases: Secondary | ICD-10-CM | POA: Insufficient documentation

## 2019-04-27 DIAGNOSIS — Z7401 Bed confinement status: Secondary | ICD-10-CM | POA: Insufficient documentation

## 2019-04-27 DIAGNOSIS — Z993 Dependence on wheelchair: Secondary | ICD-10-CM | POA: Insufficient documentation

## 2019-04-27 DIAGNOSIS — F0280 Dementia in other diseases classified elsewhere without behavioral disturbance: Secondary | ICD-10-CM | POA: Insufficient documentation

## 2019-04-27 DIAGNOSIS — R4182 Altered mental status, unspecified: Principal | ICD-10-CM | POA: Insufficient documentation

## 2019-04-27 DIAGNOSIS — Z885 Allergy status to narcotic agent status: Secondary | ICD-10-CM | POA: Diagnosis not present

## 2019-04-27 DIAGNOSIS — Z66 Do not resuscitate: Secondary | ICD-10-CM | POA: Insufficient documentation

## 2019-04-27 DIAGNOSIS — R410 Disorientation, unspecified: Secondary | ICD-10-CM

## 2019-04-27 DIAGNOSIS — R778 Other specified abnormalities of plasma proteins: Secondary | ICD-10-CM

## 2019-04-27 DIAGNOSIS — I10 Essential (primary) hypertension: Secondary | ICD-10-CM | POA: Insufficient documentation

## 2019-04-27 DIAGNOSIS — Z6826 Body mass index (BMI) 26.0-26.9, adult: Secondary | ICD-10-CM | POA: Diagnosis not present

## 2019-04-27 DIAGNOSIS — E46 Unspecified protein-calorie malnutrition: Secondary | ICD-10-CM | POA: Insufficient documentation

## 2019-04-27 DIAGNOSIS — G309 Alzheimer's disease, unspecified: Secondary | ICD-10-CM | POA: Diagnosis not present

## 2019-04-27 DIAGNOSIS — Z7982 Long term (current) use of aspirin: Secondary | ICD-10-CM | POA: Diagnosis not present

## 2019-04-27 LAB — CBC WITH DIFFERENTIAL/PLATELET
Abs Immature Granulocytes: 0.04 10*3/uL (ref 0.00–0.07)
Basophils Absolute: 0.1 10*3/uL (ref 0.0–0.1)
Basophils Relative: 1 %
Eosinophils Absolute: 0.2 10*3/uL (ref 0.0–0.5)
Eosinophils Relative: 4 %
HCT: 37.4 % (ref 36.0–46.0)
Hemoglobin: 11.2 g/dL — ABNORMAL LOW (ref 12.0–15.0)
Immature Granulocytes: 1 %
Lymphocytes Relative: 39 %
Lymphs Abs: 2.2 10*3/uL (ref 0.7–4.0)
MCH: 29.5 pg (ref 26.0–34.0)
MCHC: 29.9 g/dL — ABNORMAL LOW (ref 30.0–36.0)
MCV: 98.4 fL (ref 80.0–100.0)
Monocytes Absolute: 0.5 10*3/uL (ref 0.1–1.0)
Monocytes Relative: 9 %
Neutro Abs: 2.5 10*3/uL (ref 1.7–7.7)
Neutrophils Relative %: 46 %
Platelets: 276 10*3/uL (ref 150–400)
RBC: 3.8 MIL/uL — ABNORMAL LOW (ref 3.87–5.11)
RDW: 13.6 % (ref 11.5–15.5)
WBC: 5.6 10*3/uL (ref 4.0–10.5)
nRBC: 0 % (ref 0.0–0.2)

## 2019-04-27 LAB — COMPREHENSIVE METABOLIC PANEL
ALT: 13 U/L (ref 0–44)
AST: 24 U/L (ref 15–41)
Albumin: 3.6 g/dL (ref 3.5–5.0)
Alkaline Phosphatase: 88 U/L (ref 38–126)
Anion gap: 7 (ref 5–15)
BUN: 16 mg/dL (ref 8–23)
CO2: 28 mmol/L (ref 22–32)
Calcium: 9.4 mg/dL (ref 8.9–10.3)
Chloride: 107 mmol/L (ref 98–111)
Creatinine, Ser: 0.85 mg/dL (ref 0.44–1.00)
GFR calc Af Amer: >60 mL/min (ref >60)
GFR calc non Af Amer: 57 mL/min — ABNORMAL LOW (ref >60)
Glucose, Bld: 86 mg/dL (ref 70–99)
Potassium: 4.1 mmol/L (ref 3.5–5.1)
Sodium: 142 mmol/L (ref 135–145)
Total Bilirubin: 0.7 mg/dL (ref 0.3–1.2)
Total Protein: 7.2 g/dL (ref 6.5–8.1)

## 2019-04-27 LAB — URINALYSIS, ROUTINE W REFLEX MICROSCOPIC
Bacteria, UA: NONE SEEN
Bilirubin Urine: NEGATIVE
Glucose, UA: NEGATIVE mg/dL
Hgb urine dipstick: NEGATIVE
Ketones, ur: NEGATIVE mg/dL
Nitrite: NEGATIVE
Protein, ur: NEGATIVE mg/dL
Specific Gravity, Urine: 1.016 (ref 1.005–1.030)
pH: 5 (ref 5.0–8.0)

## 2019-04-27 LAB — TROPONIN I (HIGH SENSITIVITY)
Troponin I (High Sensitivity): 73 ng/L — ABNORMAL HIGH (ref <18)
Troponin I (High Sensitivity): 67 ng/L — ABNORMAL HIGH (ref <18)

## 2019-04-27 LAB — CREATININE, SERUM
Creatinine, Ser: 0.86 mg/dL (ref 0.44–1.00)
GFR calc Af Amer: >60 mL/min (ref >60)
GFR calc non Af Amer: 57 mL/min — ABNORMAL LOW (ref >60)

## 2019-04-27 LAB — CBC
HCT: 36.5 % (ref 36.0–46.0)
Hemoglobin: 11.3 g/dL — ABNORMAL LOW (ref 12.0–15.0)
MCH: 30.1 pg (ref 26.0–34.0)
MCHC: 31 g/dL (ref 30.0–36.0)
MCV: 97.3 fL (ref 80.0–100.0)
Platelets: 264 10*3/uL (ref 150–400)
RBC: 3.75 MIL/uL — ABNORMAL LOW (ref 3.87–5.11)
RDW: 13.5 % (ref 11.5–15.5)
WBC: 5 10*3/uL (ref 4.0–10.5)
nRBC: 0 % (ref 0.0–0.2)

## 2019-04-27 LAB — TSH: TSH: 5.394 u[IU]/mL — ABNORMAL HIGH (ref 0.350–4.500)

## 2019-04-27 LAB — AMMONIA: Ammonia: 16 umol/L (ref 9–35)

## 2019-04-27 LAB — SARS CORONAVIRUS 2 BY RT PCR (HOSPITAL ORDER, PERFORMED IN ~~LOC~~ HOSPITAL LAB): SARS Coronavirus 2: NEGATIVE

## 2019-04-27 LAB — VITAMIN B12: Vitamin B-12: 3013 pg/mL — ABNORMAL HIGH (ref 180–914)

## 2019-04-27 MED ORDER — ONDANSETRON HCL 4 MG/2ML IJ SOLN: 4.0000 mg | Freq: Four times a day (QID) | INTRAMUSCULAR | Status: DC | PRN

## 2019-04-27 MED ORDER — LOPERAMIDE HCL 2 MG PO CAPS: 2.0000 mg | ORAL_CAPSULE | ORAL | Status: DC | PRN

## 2019-04-27 MED ORDER — LAMOTRIGINE 25 MG PO TABS
75.0000 mg | ORAL_TABLET | Freq: Two times a day (BID) | ORAL | Status: DC
  Administered 2019-04-27 – 2019-04-28 (×2): 75 mg via ORAL
  Filled 2019-04-27 (×2): qty 3

## 2019-04-27 MED ORDER — ACETAMINOPHEN 650 MG RE SUPP: 650.0000 mg | Freq: Four times a day (QID) | RECTAL | Status: DC | PRN

## 2019-04-27 MED ORDER — LACTULOSE 10 GM/15ML PO SOLN
20.0000 g | Freq: Every day | ORAL | Status: DC
  Administered 2019-04-28: 20 g via ORAL
  Filled 2019-04-27: qty 30

## 2019-04-27 MED ORDER — FERROUS SULFATE 325 (65 FE) MG PO TABS
325.0000 mg | ORAL_TABLET | Freq: Every day | ORAL | Status: DC
  Administered 2019-04-28: 325 mg via ORAL
  Filled 2019-04-27 (×2): qty 1

## 2019-04-27 MED ORDER — ACETAMINOPHEN 325 MG PO TABS: 650.0000 mg | ORAL_TABLET | Freq: Four times a day (QID) | ORAL | Status: DC | PRN

## 2019-04-27 MED ORDER — ONDANSETRON HCL 4 MG PO TABS: 4.0000 mg | ORAL_TABLET | Freq: Four times a day (QID) | ORAL | Status: DC | PRN

## 2019-04-27 MED ORDER — ALBUTEROL SULFATE (2.5 MG/3ML) 0.083% IN NEBU: 2.5000 mg | INHALATION_SOLUTION | RESPIRATORY_TRACT | Status: DC | PRN

## 2019-04-27 MED ORDER — ENOXAPARIN SODIUM 30 MG/0.3ML ~~LOC~~ SOLN
30.0000 mg | Freq: Every day | SUBCUTANEOUS | Status: DC
  Administered 2019-04-27: 30 mg via SUBCUTANEOUS
  Filled 2019-04-27: qty 0.3

## 2019-04-27 MED ORDER — VITAMIN B-12 1000 MCG PO TABS
500.0000 ug | ORAL_TABLET | Freq: Every day | ORAL | Status: DC
  Administered 2019-04-28: 500 ug via ORAL
  Filled 2019-04-27: qty 1

## 2019-04-27 MED ORDER — BOOST / RESOURCE BREEZE PO LIQD CUSTOM
1.0000 | Freq: Three times a day (TID) | ORAL | Status: DC
  Administered 2019-04-28: 1 via ORAL

## 2019-04-27 MED ORDER — ASPIRIN 81 MG PO CHEW
81.0000 mg | CHEWABLE_TABLET | Freq: Every day | ORAL | Status: DC
  Administered 2019-04-28: 81 mg via ORAL
  Filled 2019-04-27: qty 1

## 2019-04-27 MED ORDER — LEVOTHYROXINE SODIUM 50 MCG PO TABS
50.0000 ug | ORAL_TABLET | Freq: Every day | ORAL | Status: DC
  Filled 2019-04-27: qty 1

## 2019-04-27 MED ORDER — GABAPENTIN 100 MG PO CAPS
100.0000 mg | ORAL_CAPSULE | Freq: Two times a day (BID) | ORAL | Status: DC
  Administered 2019-04-27 – 2019-04-28 (×2): 100 mg via ORAL
  Filled 2019-04-27 (×2): qty 1

## 2019-04-27 NOTE — ED Notes (Signed)
Notified phlebotomy to obtain blood since patient is a hard stick.

## 2019-04-27 NOTE — ED Triage Notes (Signed)
Per EMS: Pt from [facility] memory care unit.  Staff called out for AMS, concerned for UTI.  Pt had visual hallucinations yesterday.  Pt is lethargic today.   Pt has dementia.    P 77 BP 146/76 SPO2 100 RA CBG 106 RR 19

## 2019-04-27 NOTE — ED Notes (Signed)
Due to prominent lack of hip mobility, this RN unable to obtain in and out for urine.  Pt placed on pure-wick

## 2019-04-27 NOTE — ED Provider Notes (Signed)
[facility] DEPT Provider Note   CSN: [id number] Arrival date & time: 04/27/19  1302     History   Chief Complaint No chief complaint on file.   HPI Kathryn Whitaker is a 85 y.o. female.     Level 5 caveat for dementia.  Patient sent from facility with "lethargy".  Patient not able to give any history.  She is oriented to person only.  [facility] staff reports patient has not been herself since yesterday and she has been "lethargic".  She is not been eating or drinking much and she seems more confused than usual.  No fever.  No fall or known trauma.  They are concerned for possible urinary tract infection.  He was reported she was also having some visual hallucinations and she seemed more confused than normal.  Patient not able to give any history herself.  She is wheelchair-bound and answers a few questions at baseline.  The history is provided by the nursing home. The history is limited by the condition of the patient.    Past Medical History:  Diagnosis Date  . Acute asthmatic bronchitis   . Allergic rhinitis   . Alzheimer's dementia ([place])   . Anxiety   . Chronic constipation   . Diverticulosis of colon   . DJD (degenerative joint disease)   . GERD (gastroesophageal reflux disease)   . History of colonic polyps   . Hypercholesteremia   . Hypertension   . IBS (irritable bowel syndrome)   . Lacunar infarction ([place])   . Lumbar back pain   . Malnutrition ([place])   . UTI (lower urinary tract infection)   . Vitamin D deficiency     Patient Active Problem List   Diagnosis Date Noted  . Hallucinations   . Acute blood loss anemia 01/15/2017  . Hip fracture, left ([place]) 01/12/2017  . Hip fracture ([place]) 01/12/2017  . S/p left hip fracture 01/12/2017  . Dementia with psychosis ([place]) 07/26/2016  . Dysuria 08/10/2015  . Hypothyroidism 06/06/2015  . Protein-calorie malnutrition, severe ([place]) 06/26/2014  . Constipation  12/17/2013  . COPD (chronic obstructive pulmonary disease) ([place]) 06/03/2013  . IBS (irritable bowel syndrome) 12/30/2012  . Generalized anxiety disorder 12/30/2012  . Dyslipidemia 12/12/2012  . HTN (hypertension) 12/12/2012  . Hx of completed stroke 07/11/2009    Past Surgical History:  Procedure Laterality Date  . ABDOMINAL HYSTERECTOMY    . FEMUR IM NAIL Left 01/13/2017   Procedure: TROCHANTERIC INTRAMEDULLARY (IM) NAIL FEMORAL;  Surgeon: Renette Butters, MD;  Location: [facility];  Service: Orthopedics;  Laterality: Left;     OB History   No obstetric history on file.      Home Medications    Prior to Admission medications   Medication Sig Start Date End Date Taking? Authorizing Provider  alum & mag hydroxide-simeth (MINTOX) 200-200-20 MG/5ML suspension Take 30 mLs by mouth as needed for indigestion or heartburn.   Yes [provider]  aspirin 81 MG chewable tablet Chew 81 mg by mouth daily.   Yes [provider]  ferrous sulfate 325 (65 FE) MG tablet Take 325 mg by mouth daily with breakfast.   Yes [provider]  gabapentin (NEURONTIN) 100 MG capsule Take 100 mg by mouth 2 (two) times daily.   Yes [provider]  guaiFENesin (ROBITUSSIN) 100 MG/5ML SOLN Take 10 mLs by mouth every 6 (six) hours as needed for cough or to loosen phlegm.   Yes [provider]  haloperidol (HALDOL) 2 MG/ML solution Take 1 mg by mouth 2 (two) times daily.   Yes [provider]  lactulose (CHRONULAC) 10 GM/15ML solution Take 20 g by mouth daily.   Yes [provider]  lamoTRIgine (LAMICTAL) 25 MG tablet Take 3 tablets (75 mg total) by mouth 2 (two) times daily. 04/08/17  Yes Ethelene Hal, NP  levothyroxine (SYNTHROID, LEVOTHROID) 50 MCG tablet TAKE 1 TABLET (50 MCG TOTAL) BY MOUTH DAILY BEFORE BREAKFAST. 01/06/17  Yes Golden Circle, FNP  Lidocaine HCl (ASPERCREME LIDOCAINE) 4 % LIQD Apply 1 application topically 2 (two) times  daily. Apply to R.knee   Yes [provider]  loperamide (IMODIUM) 2 MG capsule Take 2 mg by mouth as needed for diarrhea or loose stools.   Yes [provider]  LORazepam (ATIVAN) 0.5 MG tablet Take 0.5 mg by mouth 2 (two) times daily as needed for anxiety.   Yes [provider]  magnesium hydroxide (MILK OF MAGNESIA) 400 MG/5ML suspension Take 30 mLs by mouth at bedtime as needed for mild constipation.   Yes [provider]  neomycin-bacitracin-polymyxin (NEOSPORIN) ointment Apply 1 application topically as needed for wound care.   Yes [provider]  PRESCRIPTION MEDICATION Take 1 Bottle by mouth 3 (three) times daily. Drink 1 mighty shake by mouth three times daily.   Yes [provider]  vitamin B-12 (CYANOCOBALAMIN) 250 MCG tablet Take 500 mcg by mouth daily.    Yes [provider]  albuterol (PROVENTIL HFA) 108 (90 BASE) MCG/ACT inhaler Inhale 2 puffs into the lungs every 6 (six) hours as needed. For shortness of breath Patient not taking: Reported on 04/27/2019 06/27/14   Delfina Redwood, MD  famotidine (PEPCID) 20 MG tablet Take 1 tablet (20 mg total) by mouth 2 (two) times daily. For 30 days post op for gastroprotection. Patient not taking: Reported on 04/27/2019 01/14/17 07/08/18  Prudencio Burly III, PA-C  feeding supplement (BOOST / RESOURCE BREEZE) LIQD Take 1 Container by mouth 3 (three) times daily between meals. Patient not taking: Reported on 07/08/2018 01/15/17   Janece Canterbury, MD  haloperidol (HALDOL) 0.5 MG tablet Take 1 tablet (0.5 mg total) by mouth 2 (two) times daily. Patient not taking: Reported on 08/31/2017 04/08/17   Ethelene Hal, NP  traMADol (ULTRAM) 50 MG tablet Take 1 tablet (50 mg total) by mouth 2 (two) times daily as needed for moderate pain. Patient not taking: Reported on 04/27/2019 01/14/17   Prudencio Burly III, PA-C    Family History Family History  Problem Relation Age  of Onset  . Hypertension Other     Social History Social History   Tobacco Use  . Smoking status: Never Smoker  . Smokeless tobacco: Never Used  Substance Use Topics  . Alcohol use: No    Alcohol/week: 0.0 standard drinks  . Drug use: No     Allergies   Demerol [meperidine], Influenza vaccines, Amoxicillin, Morphine and related, Peanuts [peanut oil], Prevpac [amoxicill-clarithro-lansopraz], and Telithromycin   Review of Systems Review of Systems  Unable to perform ROS: Dementia     Physical Exam Updated Vital Signs BP 109/61   Pulse 68   Temp (!) 97.5 F (36.4 C) (Oral)   Resp 16   SpO2 98%   Physical Exam Vitals signs and nursing note reviewed.  Constitutional:      General: She is not in acute distress.    Appearance: She is well-developed.  Comments: Fetal position, chronically ill-appearing  HENT:     Head: Normocephalic and atraumatic.     Nose: No congestion or rhinorrhea.     Mouth/Throat:     Mouth: Mucous membranes are dry.     Pharynx: No oropharyngeal exudate.  Eyes:     Conjunctiva/sclera: Conjunctivae normal.     Pupils: Pupils are equal, round, and reactive to light.  Neck:     Musculoskeletal: Normal range of motion and neck supple.     Comments: No meningismus. Cardiovascular:     Rate and Rhythm: Normal rate and regular rhythm.     Heart sounds: Normal heart sounds. No murmur.  Pulmonary:     Effort: Pulmonary effort is normal. No respiratory distress.     Breath sounds: Normal breath sounds.  Chest:     Chest wall: No tenderness.  Abdominal:     Palpations: Abdomen is soft.     Tenderness: There is no abdominal tenderness. There is no guarding or rebound.  Musculoskeletal: Normal range of motion.        General: No tenderness.  Skin:    General: Skin is warm.  Neurological:     General: No focal deficit present.     Mental Status: She is alert. Mental status is at baseline.     Cranial Nerves: No cranial nerve deficit.      Motor: No abnormal muscle tone.     Coordination: Coordination normal.     Comments: Oriented to person only, moves all extremities without focal deficit  Psychiatric:        Behavior: Behavior normal.      ED Treatments / Results  Labs (all labs ordered are listed, but only abnormal results are displayed) Labs Reviewed  CBC WITH DIFFERENTIAL/PLATELET - Abnormal; Notable for the following components:      Result Value   RBC 3.80 (*)    Hemoglobin 11.2 (*)    MCHC 29.9 (*)    All other components within normal limits  COMPREHENSIVE METABOLIC PANEL - Abnormal; Notable for the following components:   GFR calc non Af Amer 57 (*)    All other components within normal limits  URINALYSIS, ROUTINE W REFLEX MICROSCOPIC - Abnormal; Notable for the following components:   APPearance HAZY (*)    Leukocytes,Ua TRACE (*)    All other components within normal limits  TROPONIN I (HIGH SENSITIVITY) - Abnormal; Notable for the following components:   Troponin I (High Sensitivity) 73 (*)    All other components within normal limits  TROPONIN I (HIGH SENSITIVITY) - Abnormal; Notable for the following components:   Troponin I (High Sensitivity) 67 (*)    All other components within normal limits  CULTURE, BLOOD (ROUTINE X 2)  SARS CORONAVIRUS 2 BY RT PCR (HOSPITAL ORDER, [facility])  URINE CULTURE  CULTURE, BLOOD (ROUTINE X 2)  AMMONIA  TSH  CBC  CREATININE, SERUM  VITAMIN B12  FOLATE RBC    EKG EKG Interpretation  Date/Time:  Tuesday April 27 2019 14:12:23 EST Ventricular Rate:  70 PR Interval:    QRS Duration: 124 QT Interval:  442 QTC Calculation: 477 R Axis:   -63 Text Interpretation: Sinus rhythm Nonspecific IVCD with LAD Left ventricular hypertrophy Anterior Q waves, possibly due to LVH Borderline T abnormalities, inferior leads No significant change was found Confirmed by Ezequiel Essex [phone]) on 04/27/2019 2:41:29 PM   Radiology Ct Head Wo  Contrast  Result Date: 04/27/2019 CLINICAL DATA:  Altered LOC, lethargy EXAM: CT HEAD WITHOUT CONTRAST TECHNIQUE: Contiguous axial images were obtained from the base of the skull through the vertex without intravenous contrast. COMPARISON:  CT 07/08/2018 FINDINGS: Brain: No acute territorial infarction, hemorrhage or intracranial mass. Atrophy and moderate to marked small vessel ischemic changes of the white matter. Chronic lacunar infarct in the right caudate. Chronic lacunar infarcts in the right cerebellum. Stable ventricle size Vascular: No hyperdense vessels.  Carotid vascular calcifications. Skull: No fracture Sinuses/Orbits: No acute finding. Other: None IMPRESSION: 1. No CT evidence for acute intracranial abnormality. 2. Atrophy and small vessel ischemic changes of the white matter. Chronic lacunar infarcts in the right cerebellum and right basal ganglia. Electronically Signed   By: Donavan Foil M.D.   On: 04/27/2019 15:43   Dg Chest Portable 1 View  Result Date: 04/27/2019 CLINICAL DATA:  Altered mental status. EXAM: PORTABLE CHEST 1 VIEW COMPARISON:  08/31/2017. FINDINGS: Stable mediastinal prominence, most likely prominent great vessels. Heart size normal. Mild left base subsegmental atelectasis versus scarring. No focal infiltrate. No pleural effusion or pneumothorax. Degenerative changes scoliosis thoracic spine. IMPRESSION: No acute cardiopulmonary disease. Mild left base subsegmental atelectasis versus scarring. Electronically Signed   By: Marcello Moores  Register   On: 04/27/2019 15:58    Procedures Procedures (including critical care time)  Medications Ordered in ED Medications - No data to display   Initial Impression / Assessment and Plan / ED Course  I have reviewed the triage vital signs and the nursing notes.  Pertinent labs & imaging results that were available during my care of the patient were reviewed by me and considered in my medical decision making (see chart for details).        From facility with decreased mental status and confusion over the past 1 day Daughter has arrived. States mother is not at baseline and is more confused and somnolent.   AMS workup without obvious etiology. CT head stable.  CXR and UA negative.  Coronavirus negative.  Mild troponin elevation with LVH on EKG. Unable to assess whether she is having chest pain.  Daughter feels patient still not at baseline. Suspect worsening dementia, but may benefit from MRI to r/o new stroke and will need trending of troponin/  Admission d/w Dr. Hollice Gong.  CANEI STROHMEIER was evaluated in Emergency Department on 04/27/2019 for the symptoms described in the history of present illness. She was evaluated in the context of the global COVID-19 pandemic, which necessitated consideration that the patient might be at risk for infection with the SARS-CoV-2 virus that causes COVID-19. Institutional protocols and algorithms that pertain to the evaluation of patients at risk for COVID-19 are in a state of rapid change based on information released by regulatory bodies including the CDC and federal and state organizations. These policies and algorithms were followed during the patient's care in the ED.   Final Clinical Impressions(s) / ED Diagnoses   Final diagnoses:  Altered mental status, unspecified altered mental status type  Elevated troponin    ED Discharge Orders    None       Karion Cudd, Annie Main, MD 04/27/19 [phone]

## 2019-04-27 NOTE — ED Notes (Signed)
RN at bedside doing [place] IV

## 2019-04-27 NOTE — Progress Notes (Signed)
Lovenox per Pharmacy for DVT Prophylaxis    Pharmacy has been consulted from dosing enoxaparin (lovenox) in this patient for DVT prophylaxis.  The pharmacist has reviewed pertinent labs (Hgb _11.3__; PLT_264__), patient weight (prior 43_kg) and renal function (CrCl___mL/min) and decided that enoxaparin _30_mg SQ Q24Hrs is appropriate for this patient.  The pharmacy department will sign off at this time.  Please reconsult pharmacy if status changes or for further issues.  Thank you  Cyndia Diver PharmD, BCPS  04/27/2019, 11:16 PM

## 2019-04-27 NOTE — H&P (Signed)
History and Physical  Kathryn Whitaker [id number] DOB: Sep 05, 1919 DOA: 04/27/2019   PCP: Hoyt Koch, MD   Patient coming from: Nursing home  Chief Complaint: Weakness, lethargy  HPI: Kathryn Whitaker is a 85 y.o. female with medical history significant for Alzheimer's dementia, chronic anxiety, malnutrition being admitted to the hospital due to altered mental status.  The patient is currently quite lethargic unable to wake up or have a conversation.  History is provided by the patient's son and daughter.  The time with the patient is a resident of [facility] memory care unit.  They say that is the beginning of the summer, she has intermittently been lethargic, and confused at times.  She is also had some periods of confusion with hallucinations.  This past Saturday 10/31, the patient was in her usual state of health, she was awake and alert, conversant.  However today when the daughter went to visit the patient, she was very lethargic, unable to wake up.  At baseline, the patient is bedbound but usually alert and oriented.  As far as the family knows, there has not been any nausea or vomiting, no complaints of pain, no fevers or chills.  No cough or complaints of shortness of breath.  Review of Systems: Please see HPI for pertinent positives and negatives. A complete 10 system review of systems could not be performed due to the patient's condition.  Past Medical History:  Diagnosis Date  . Acute asthmatic bronchitis   . Allergic rhinitis   . Alzheimer's dementia ([place])   . Anxiety   . Chronic constipation   . Diverticulosis of colon   . DJD (degenerative joint disease)   . GERD (gastroesophageal reflux disease)   . History of colonic polyps   . Hypercholesteremia   . Hypertension   . IBS (irritable bowel syndrome)   . Lacunar infarction ([place])   . Lumbar back pain   . Malnutrition ([place])   . UTI (lower urinary tract infection)   . Vitamin D deficiency    Past  Surgical History:  Procedure Laterality Date  . ABDOMINAL HYSTERECTOMY    . FEMUR IM NAIL Left 01/13/2017   Procedure: TROCHANTERIC INTRAMEDULLARY (IM) NAIL FEMORAL;  Surgeon: Renette Butters, MD;  Location: [facility];  Service: Orthopedics;  Laterality: Left;    Social History:  reports that she has never smoked. She has never used smokeless tobacco. She reports that she does not drink alcohol or use drugs.   Allergies  Allergen Reactions  . Demerol [Meperidine]     Pt states she can't remember type of reaction  . Influenza Vaccines     Pt can't remember reaction but states allergic and has not had in 25 years  . Amoxicillin     PER MAR  . Morphine And Related Dermatitis  . Peanuts [Peanut Oil]     Pt reports she is allergic to peanuts, but was asking to eat peanut butter. Pt unsure of her reaction.   [name] [Amoxicill-Clarithro-Lansopraz] Hives    .Marland KitchenHas patient had a PCN reaction causing immediate rash, facial/tongue/throat swelling, SOB or lightheadedness with hypotension: unknown Has patient had a PCN reaction causing severe rash involving mucus membranes or skin necrosis: unknown Has patient had a PCN reaction that required hospitalization unknown Has patient had a PCN reaction occurring within the last 10 years: unknown If all of the above answers are "NO", then may proceed with Cephalosporin use.   . Telithromycin Hives    REACTION: pt  states HIVES....    Family History  Problem Relation Age of Onset  . Hypertension Other      Prior to Admission medications   Medication Sig Start Date End Date Taking? Authorizing Provider  aspirin 81 MG chewable tablet Chew 81 mg by mouth daily.   Yes [provider]  ferrous sulfate 325 (65 FE) MG tablet Take 325 mg by mouth daily with breakfast.   Yes [provider]  gabapentin (NEURONTIN) 100 MG capsule Take 100 mg by mouth 2 (two) times daily.   Yes [provider]  lactulose (CHRONULAC) 10 GM/15ML  solution Take 20 g by mouth daily.   Yes [provider]  lamoTRIgine (LAMICTAL) 25 MG tablet Take 3 tablets (75 mg total) by mouth 2 (two) times daily. 04/08/17  Yes Ethelene Hal, NP  levothyroxine (SYNTHROID, LEVOTHROID) 50 MCG tablet TAKE 1 TABLET (50 MCG TOTAL) BY MOUTH DAILY BEFORE BREAKFAST. 01/06/17  Yes Golden Circle, FNP  loperamide (IMODIUM) 2 MG capsule Take 2 mg by mouth as needed for diarrhea or loose stools.   Yes [provider]  vitamin B-12 (CYANOCOBALAMIN) 250 MCG tablet Take 500 mcg by mouth daily.    Yes [provider]  albuterol (PROVENTIL HFA) 108 (90 BASE) MCG/ACT inhaler Inhale 2 puffs into the lungs every 6 (six) hours as needed. For shortness of breath Patient not taking: Reported on 04/27/2019 06/27/14   Delfina Redwood, MD  famotidine (PEPCID) 20 MG tablet Take 1 tablet (20 mg total) by mouth 2 (two) times daily. For 30 days post op for gastroprotection. Patient not taking: Reported on 04/27/2019 01/14/17 07/08/18  Prudencio Burly III, PA-C  feeding supplement (BOOST / RESOURCE BREEZE) LIQD Take 1 Container by mouth 3 (three) times daily between meals. Patient not taking: Reported on 07/08/2018 01/15/17   Janece Canterbury, MD    Physical Exam: BP 135/61   Pulse 68   Temp (!) 97.3 F (36.3 C) (Rectal)   Resp 18   SpO2 98%   General: Sleepy and lethargic, unarousable.  She does withdraw to pain.  Resting comfortably, she appears her stated age. Eyes: EOMI, clear conjuctivae, white sclerea Neck: supple, no masses, trachea mildline  Cardiovascular: RRR, no murmurs or rubs, no peripheral edema  Respiratory: clear to auscultation bilaterally, no wheezes, no crackles  Abdomen: soft, nontender, nondistended, normal bowel tones heard  Skin: dry, no rashes  Musculoskeletal: no joint effusions, she is somewhat retracted Psychiatric: appropriate affect, normal speech  Neurologic: extraocular muscles intact, clear speech, moving  all extremities with intact sensorium            Labs on Admission:  Basic Metabolic Panel: Recent Labs  Lab 04/27/19 1419  NA 142  K 4.1  CL 107  CO2 28  GLUCOSE 86  BUN 16  CREATININE 0.85  CALCIUM 9.4   Liver Function Tests: Recent Labs  Lab 04/27/19 1419  AST 24  ALT 13  ALKPHOS 88  BILITOT 0.7  PROT 7.2  ALBUMIN 3.6   No results for input(s): LIPASE, AMYLASE in the last 168 hours. Recent Labs  Lab 04/27/19 1419  AMMONIA 16   CBC: Recent Labs  Lab 04/27/19 1419  WBC 5.6  NEUTROABS 2.5  HGB 11.2*  HCT 37.4  MCV 98.4  PLT 276   Cardiac Enzymes: No results for input(s): CKTOTAL, CKMB, CKMBINDEX, TROPONINI in the last 168 hours.  BNP (last 3 results) No results for input(s): BNP in the last 8760 hours.  ProBNP (last 3 results) No results for input(s): PROBNP in the last 8760 hours.  CBG: No results for input(s): GLUCAP in the last 168 hours.  Radiological Exams on Admission: Ct Head Wo Contrast  Result Date: 04/27/2019 CLINICAL DATA:  Altered LOC, lethargy EXAM: CT HEAD WITHOUT CONTRAST TECHNIQUE: Contiguous axial images were obtained from the base of the skull through the vertex without intravenous contrast. COMPARISON:  CT 07/08/2018 FINDINGS: Brain: No acute territorial infarction, hemorrhage or intracranial mass. Atrophy and moderate to marked small vessel ischemic changes of the white matter. Chronic lacunar infarct in the right caudate. Chronic lacunar infarcts in the right cerebellum. Stable ventricle size Vascular: No hyperdense vessels.  Carotid vascular calcifications. Skull: No fracture Sinuses/Orbits: No acute finding. Other: None IMPRESSION: 1. No CT evidence for acute intracranial abnormality. 2. Atrophy and small vessel ischemic changes of the white matter. Chronic lacunar infarcts in the right cerebellum and right basal ganglia. Electronically Signed   By: Donavan Foil M.D.   On: 04/27/2019 15:43   Dg Chest Portable 1 View  Result  Date: 04/27/2019 CLINICAL DATA:  Altered mental status. EXAM: PORTABLE CHEST 1 VIEW COMPARISON:  08/31/2017. FINDINGS: Stable mediastinal prominence, most likely prominent great vessels. Heart size normal. Mild left base subsegmental atelectasis versus scarring. No focal infiltrate. No pleural effusion or pneumothorax. Degenerative changes scoliosis thoracic spine. IMPRESSION: No acute cardiopulmonary disease. Mild left base subsegmental atelectasis versus scarring. Electronically Signed   By: Marcello Moores  Register   On: 04/27/2019 15:58    Assessment/Plan Present on Admission: . AMS (altered mental status)  Active Problems:   AMS (altered mental status) This is a 85 year old African-American female with a history of anxiety, Alzheimer's dementia being admitted to the hospital with altered mental status including lethargy, confusion and some hallucinations. -Etiology unclear at this point, work-up including CT scan of the head, lab work, has been unremarkable except for slightly elevated troponin.  No signs or symptoms of infection. -Observation admission -Trend troponin -Supportive care -The patient is on Lamictal, scheduled Haldol, as well as as needed Ativan.  Overmedication could certainly be playing a role in her presentation.  We will hold Ativan and Haldol, continue her scheduled Lamictal. -Continue other home medications as appropriate  Iron deficiency anemia-continue ferrous sulfate  Peripheral neuropathy-continue gabapentin  Chronic constipation-continue lactulose  Hypothyroidism-continue Synthroid, check TSH due to altered mental status  DVT prophylaxis: Lovenox  Code Status: DNR, as discussed with her son over the phone.  Family Communication: Plan of care discussed in detail with her son and daughter.  Disposition Plan: Observation level of care in the hospital, if the patient remains stable we will plan on discharge back to her nursing memory care facility in the morning.   Consults called: None  Admission status: Observation  Time spent: 35 minutes  Mir Marry Guan MD [facility] Pager [phone]  If 7PM-7AM, please contact night-coverage [email or web address] Password [facility]  04/27/2019, 6:13 PM

## 2019-04-28 ENCOUNTER — Other Ambulatory Visit: Payer: Self-pay

## 2019-04-28 ENCOUNTER — Encounter (HOSPITAL_COMMUNITY): Payer: Self-pay

## 2019-04-28 DIAGNOSIS — R410 Disorientation, unspecified: Secondary | ICD-10-CM | POA: Diagnosis not present

## 2019-04-28 LAB — MRSA PCR SCREENING: MRSA by PCR: NEGATIVE

## 2019-04-28 NOTE — TOC Transition Note (Addendum)
Transition of Care [facility]) - CM/SW Discharge Note   Patient Details  Name: Kathryn Whitaker MRN: [id number] Date of Birth: 05/18/1920  [facility] of [facility]) CM/SW Contact:  Lia Hopping, LCSW Phone Number: 04/28/2019, 9:35 AM   Clinical Narrative:    Resident care manager Crystal at [facility] notified of discharge.  CSW faxed D/C Summary to: [phone]. D/C under review.  CSW notified patient son and daugther.  Patient will transport by [facility].   No FL2 needed.    Final next level of care: Memory Care Barriers to Discharge: No Barriers Identified   Patient Goals and CMS Choice     Choice offered to / list presented to : NA  Discharge Placement              Patient chooses bed at: [facility] Patient to be transferred to facility by: [facility] Name of family member notified: Son: Wyvonnia Dusky Knight/ Patient and family notified of of transfer: 04/28/19  Discharge Plan and Services                                     Social Determinants of Health (SDOH) Interventions     Readmission Risk Interventions No flowsheet data found.

## 2019-04-28 NOTE — Discharge Summary (Signed)
Discharge Summary  Kathryn Whitaker [id number] DOB: 09/03/1919  PCP: Hoyt Koch, MD  Admit date: 04/27/2019 Discharge date: 04/28/2019   Recommendations for Outpatient Follow-up:  1. PCP 2 weeks   Discharge Diagnoses:  Active Hospital Problems   Diagnosis Date Noted  . AMS (altered mental status) 04/27/2019    Resolved Hospital Problems  No resolved problems to display.    Discharge Condition: Stable   Diet recommendation: Regular as tolerated   Vitals:   04/28/19 0009 04/28/19 0550  BP: 138/75 129/76  Pulse: 92 87  Resp: 18 16  Temp: 98.3 F (36.8 C) 98.6 F (37 C)  SpO2: 95% 96%    HPI and Brief Hospital Course:  This is a 85 year old African-American female with a history of anxiety, Alzheimer's dementia being admitted to the hospital with altered mental status including lethargy, confusion and some hallucinations. Troponin was slightly elevatd but the patient had no complaints of chest pain, vitals were stable. I had long discussion with the patient's daughter as well as her son, and they told me she had been intermittently lethargic for the last few weeks. There was not any complaints of any signs or symptoms of infection or other acute process. Afterwards I reviewed the patient's home medications and it seems she has been on Lamictal, Haldol BID and PRN ativan. The Lamictal was continued but other sedating medications held and the patient is awake alert and oriented today, she seems to be back to her baseline as descried by her children. Second troponin was coming down and given DNR status and advanced age will not pursue further cardiac study.  Discharge details, plan of care and follow up instructions were discussed with patient and any available family or care providers. Patient and family are in agreement with discharge from the hospital today and all questions were answered to their satisfaction.  Procedures:  None   Consultations:  None    Discharge Exam: BP 129/76 (BP Location: Left Arm)   Pulse 87   Temp 98.6 F (37 C) (Oral)   Resp 16   Ht 5\' 2"  (1.575 m)   Wt 65 kg   SpO2 96%   BMI 26.21 kg/m  General:  Alert, oriented, calm, in no acute distress, appears her stated age Eyes: EOMI, clear sclerea Neck: supple, no masses, trachea mildline  Cardiovascular: RRR, no murmurs or rubs, no peripheral edema  Respiratory: clear to auscultation bilaterally, no wheezes, no crackles  Abdomen: soft, nontender, nondistended, normal bowel tones heard  Skin: dry, no rashes  Musculoskeletal: no joint effusions, normal range of motion  Psychiatric: appropriate affect, normal speech  Neurologic: extraocular muscles intact, clear speech, moving all extremities with intact sensorium    Discharge Instructions You were cared for by a hospitalist during your hospital stay. If you have any questions about your discharge medications or the care you received while you were in the hospital after you are discharged, you can call the unit and asked to speak with the hospitalist on call if the hospitalist that took care of you is not available. Once you are discharged, your primary care physician will handle any further medical issues. Please note that NO REFILLS for any discharge medications will be authorized once you are discharged, as it is imperative that you return to your primary care physician (or establish a relationship with a primary care physician if you do not have one) for your aftercare needs so that they can reassess your need for medications and monitor your  lab values.  Discharge Instructions    Diet - low sodium heart healthy   Complete by: As directed    Increase activity slowly   Complete by: As directed      Allergies as of 04/28/2019      Reactions   Demerol [meperidine]    Pt states she can't remember type of reaction   Influenza Vaccines    Pt can't remember reaction but states allergic and has not had in 25 years    Amoxicillin    PER MAR   Morphine And Related Dermatitis   Peanuts [peanut Oil]    Pt reports she is allergic to peanuts, but was asking to eat peanut butter. Pt unsure of her reaction.    Prevpac [amoxicill-clarithro-lansopraz] Hives   .Marland KitchenHas patient had a PCN reaction causing immediate rash, facial/tongue/throat swelling, SOB or lightheadedness with hypotension: unknown Has patient had a PCN reaction causing severe rash involving mucus membranes or skin necrosis: unknown Has patient had a PCN reaction that required hospitalization unknown Has patient had a PCN reaction occurring within the last 10 years: unknown If all of the above answers are "NO", then may proceed with Cephalosporin use.   Telithromycin Hives   REACTION: pt states HIVES....      Medication List    TAKE these medications   albuterol 108 (90 Base) MCG/ACT inhaler Commonly known as: Proventil HFA Inhale 2 puffs into the lungs every 6 (six) hours as needed. For shortness of breath   aspirin 81 MG chewable tablet Chew 81 mg by mouth daily.   famotidine 20 MG tablet Commonly known as: PEPCID Take 1 tablet (20 mg total) by mouth 2 (two) times daily. For 30 days post op for gastroprotection.   feeding supplement Liqd Take 1 Container by mouth 3 (three) times daily between meals.   ferrous sulfate 325 (65 FE) MG tablet Take 325 mg by mouth daily with breakfast.   gabapentin 100 MG capsule Commonly known as: NEURONTIN Take 100 mg by mouth 2 (two) times daily.   lactulose 10 GM/15ML solution Commonly known as: CHRONULAC Take 20 g by mouth daily.   lamoTRIgine 25 MG tablet Commonly known as: LAMICTAL Take 3 tablets (75 mg total) by mouth 2 (two) times daily.   levothyroxine 50 MCG tablet Commonly known as: SYNTHROID TAKE 1 TABLET (50 MCG TOTAL) BY MOUTH DAILY BEFORE BREAKFAST.   loperamide 2 MG capsule Commonly known as: IMODIUM Take 2 mg by mouth as needed for diarrhea or loose stools.   vitamin B-12  250 MCG tablet Commonly known as: CYANOCOBALAMIN Take 500 mcg by mouth daily.      Allergies  Allergen Reactions  . Demerol [Meperidine]     Pt states she can't remember type of reaction  . Influenza Vaccines     Pt can't remember reaction but states allergic and has not had in 25 years  . Amoxicillin     PER MAR  . Morphine And Related Dermatitis  . Peanuts [Peanut Oil]     Pt reports she is allergic to peanuts, but was asking to eat peanut butter. Pt unsure of her reaction.   [name] [Amoxicill-Clarithro-Lansopraz] Hives    .Marland KitchenHas patient had a PCN reaction causing immediate rash, facial/tongue/throat swelling, SOB or lightheadedness with hypotension: unknown Has patient had a PCN reaction causing severe rash involving mucus membranes or skin necrosis: unknown Has patient had a PCN reaction that required hospitalization unknown Has patient had a PCN reaction occurring within the last  10 years: unknown If all of the above answers are "NO", then may proceed with Cephalosporin use.   . Telithromycin Hives    REACTION: pt states HIVES....      The results of significant diagnostics from this hospitalization (including imaging, microbiology, ancillary and laboratory) are listed below for reference.    Significant Diagnostic Studies: Ct Head Wo Contrast  Result Date: 04/27/2019 CLINICAL DATA:  Altered LOC, lethargy EXAM: CT HEAD WITHOUT CONTRAST TECHNIQUE: Contiguous axial images were obtained from the base of the skull through the vertex without intravenous contrast. COMPARISON:  CT 07/08/2018 FINDINGS: Brain: No acute territorial infarction, hemorrhage or intracranial mass. Atrophy and moderate to marked small vessel ischemic changes of the white matter. Chronic lacunar infarct in the right caudate. Chronic lacunar infarcts in the right cerebellum. Stable ventricle size Vascular: No hyperdense vessels.  Carotid vascular calcifications. Skull: No fracture Sinuses/Orbits: No acute  finding. Other: None IMPRESSION: 1. No CT evidence for acute intracranial abnormality. 2. Atrophy and small vessel ischemic changes of the white matter. Chronic lacunar infarcts in the right cerebellum and right basal ganglia. Electronically Signed   By: Donavan Foil M.D.   On: 04/27/2019 15:43   Dg Chest Portable 1 View  Result Date: 04/27/2019 CLINICAL DATA:  Altered mental status. EXAM: PORTABLE CHEST 1 VIEW COMPARISON:  08/31/2017. FINDINGS: Stable mediastinal prominence, most likely prominent great vessels. Heart size normal. Mild left base subsegmental atelectasis versus scarring. No focal infiltrate. No pleural effusion or pneumothorax. Degenerative changes scoliosis thoracic spine. IMPRESSION: No acute cardiopulmonary disease. Mild left base subsegmental atelectasis versus scarring. Electronically Signed   By: Marcello Moores  Register   On: 04/27/2019 15:58    Microbiology: Recent Results (from the past 240 hour(s))  Blood culture (routine x 2)     Status: None (Preliminary result)   Collection Time: 04/27/19  2:19 PM   Specimen: BLOOD RIGHT FOREARM  Result Value Ref Range Status   Specimen Description   Final    BLOOD RIGHT FOREARM Performed at [facility], [place] [address], [place], [place] [address]    Special Requests   Final    BOTTLES DRAWN AEROBIC AND ANAEROBIC Blood Culture results may not be optimal due to an inadequate volume of blood received in culture bottles Performed at [facility] [address], [place], [place] [address]    Culture PENDING  Incomplete   Report Status PENDING  Incomplete  SARS Coronavirus 2 by RT PCR (hospital order, performed in [facility] hospital lab) Nasopharyngeal Nasopharyngeal Swab     Status: None   Collection Time: 04/27/19  2:38 PM   Specimen: Nasopharyngeal Swab  Result Value Ref Range Status   SARS Coronavirus 2 NEGATIVE NEGATIVE Final    Comment: (NOTE) If result is NEGATIVE SARS-CoV-2 target nucleic acids are  NOT DETECTED. The SARS-CoV-2 RNA is generally detectable in upper and lower  respiratory specimens during the acute phase of infection. The lowest  concentration of SARS-CoV-2 viral copies this assay can detect is 250  copies / mL. A negative result does not preclude SARS-CoV-2 infection  and should not be used as the sole basis for treatment or other  patient management decisions.  A negative result may occur with  improper specimen collection / handling, submission of specimen other  than nasopharyngeal swab, presence of viral mutation(s) within the  areas targeted by this assay, and inadequate number of viral copies  (<250 copies / mL). A negative result must be combined with clinical  observations, patient history, and epidemiological information. If result is POSITIVE SARS-CoV-2 target nucleic acids are DETECTED. The SARS-CoV-2 RNA is generally detectable in upper and lower  respiratory specimens dur ing the acute phase of infection.  Positive  results are indicative of active infection with SARS-CoV-2.  Clinical  correlation with patient history and other diagnostic information is  necessary to determine patient infection status.  Positive results do  not rule out bacterial infection or co-infection with other viruses. If result is PRESUMPTIVE POSTIVE SARS-CoV-2 nucleic acids MAY BE PRESENT.   A presumptive positive result was obtained on the submitted specimen  and confirmed on repeat testing.  While 2019 novel coronavirus  (SARS-CoV-2) nucleic acids may be present in the submitted sample  additional confirmatory testing may be necessary for epidemiological  and / or clinical management purposes  to differentiate between  SARS-CoV-2 and other Sarbecovirus currently known to infect humans.  If clinically indicated additional testing with an alternate test  methodology [phone]) is advised. The SARS-CoV-2 RNA is generally  detectable in upper and lower respiratory sp ecimens  during the acute  phase of infection. The expected result is Negative. Fact Sheet for Patients:  StrictlyIdeas.no Fact Sheet for Healthcare Providers: BankingDealers.co.za This test is not yet approved or cleared by the [place] FDA and has been authorized for detection and/or diagnosis of SARS-CoV-2 by FDA under an Emergency Use Authorization (EUA).  This EUA will remain in effect (meaning this test can be used) for the duration of the COVID-19 declaration under Section 564(b)(1) of the Act, 21 U.S.C. section 360bbb-3(b)(1), unless the authorization is terminated or revoked sooner. Performed at [facility], [place] [address], [place], [place] [address]   MRSA PCR Screening     Status: None   Collection Time: 04/28/19  4:10 AM   Specimen: Nasopharyngeal  Result Value Ref Range Status   MRSA by PCR NEGATIVE NEGATIVE Final    Comment:        The GeneXpert MRSA Assay (FDA approved for NASAL specimens only), is one component of a comprehensive MRSA colonization surveillance program. It is not intended to diagnose MRSA infection nor to guide or monitor treatment for MRSA infections. Performed at [facility], [place] [address], [place], [place] [address]      Labs: Basic Metabolic Panel: Recent Labs  Lab 04/27/19 1419 04/27/19 2027  NA 142  --   K 4.1  --   CL 107  --   CO2 28  --   GLUCOSE 86  --   BUN 16  --   CREATININE 0.85 0.86  CALCIUM 9.4  --    Liver Function Tests: Recent Labs  Lab 04/27/19 1419  AST 24  ALT 13  ALKPHOS 88  BILITOT 0.7  PROT 7.2  ALBUMIN 3.6   No results for input(s): LIPASE, AMYLASE in the last 168 hours. Recent Labs  Lab 04/27/19 1419  AMMONIA 16   CBC: Recent Labs  Lab 04/27/19 1419 04/27/19 2027  WBC 5.6 5.0  NEUTROABS 2.5  --   HGB 11.2* 11.3*  HCT 37.4 36.5  MCV 98.4 97.3  PLT 276 264   Cardiac Enzymes: No results for input(s):  CKTOTAL, CKMB, CKMBINDEX, TROPONINI in the last 168 hours. BNP: BNP (last 3 results) No results for input(s): BNP in the last 8760 hours.  ProBNP (last 3 results) No results for input(s): PROBNP in the last 8760 hours.  CBG: No results for input(s): GLUCAP in the last 168  hours.  Time spent: 31 minutes were spent in preparing this discharge including medication reconciliation, counseling, and coordination of care.  Signed:  Mir Marry Guan, MD  [facility] 04/28/2019, 7:53 AM

## 2019-04-28 NOTE — Plan of Care (Signed)
Plan of care discussed.  Patient answers "I don't know" to most questions.

## 2019-04-29 ENCOUNTER — Telehealth: Payer: Self-pay | Admitting: *Deleted

## 2019-04-29 LAB — URINE CULTURE: Culture: 80000 — AB

## 2019-04-29 LAB — FOLATE RBC
Folate, Hemolysate: 581 ng/mL
Folate, RBC: 1745 ng/mL (ref >498)
Hematocrit: 33.3 % — ABNORMAL LOW (ref 34.0–46.6)

## 2019-04-29 NOTE — Telephone Encounter (Signed)
Pt was on TCM report admitted 04/28/19 for observation for altered mental status. Pt D/c 04/29/19 to SNF. Per summary pt is to f/u w/PCP after leaving SNF...Kathryn Whitaker

## 2019-05-02 LAB — CULTURE, BLOOD (ROUTINE X 2)
Culture: NO GROWTH
Culture: NO GROWTH
Special Requests: ADEQUATE

## 2019-08-13 ENCOUNTER — Emergency Department (HOSPITAL_COMMUNITY)
Admission: EM | Admit: 2019-08-13 | Discharge: 2019-08-13 | Disposition: A | Discharge status: Home / Self Care | Payer: Medicare PPO | Attending: Emergency Medicine | Admitting: Emergency Medicine

## 2019-08-13 ENCOUNTER — Encounter (HOSPITAL_COMMUNITY): Payer: Self-pay

## 2019-08-13 ENCOUNTER — Other Ambulatory Visit: Payer: Self-pay

## 2019-08-13 ENCOUNTER — Emergency Department (HOSPITAL_COMMUNITY): Payer: Medicare PPO

## 2019-08-13 DIAGNOSIS — Z91010 Allergy to peanuts: Secondary | ICD-10-CM | POA: Diagnosis not present

## 2019-08-13 DIAGNOSIS — I10 Essential (primary) hypertension: Secondary | ICD-10-CM | POA: Insufficient documentation

## 2019-08-13 DIAGNOSIS — E039 Hypothyroidism, unspecified: Secondary | ICD-10-CM | POA: Diagnosis not present

## 2019-08-13 DIAGNOSIS — N390 Urinary tract infection, site not specified: Secondary | ICD-10-CM | POA: Insufficient documentation

## 2019-08-13 DIAGNOSIS — Z8673 Personal history of transient ischemic attack (TIA), and cerebral infarction without residual deficits: Secondary | ICD-10-CM | POA: Insufficient documentation

## 2019-08-13 DIAGNOSIS — F0280 Dementia in other diseases classified elsewhere without behavioral disturbance: Secondary | ICD-10-CM | POA: Insufficient documentation

## 2019-08-13 DIAGNOSIS — G308 Other Alzheimer's disease: Secondary | ICD-10-CM | POA: Insufficient documentation

## 2019-08-13 DIAGNOSIS — Z79899 Other long term (current) drug therapy: Secondary | ICD-10-CM | POA: Insufficient documentation

## 2019-08-13 DIAGNOSIS — Z7982 Long term (current) use of aspirin: Secondary | ICD-10-CM | POA: Diagnosis not present

## 2019-08-13 DIAGNOSIS — J449 Chronic obstructive pulmonary disease, unspecified: Secondary | ICD-10-CM | POA: Insufficient documentation

## 2019-08-13 DIAGNOSIS — M79605 Pain in left leg: Secondary | ICD-10-CM | POA: Insufficient documentation

## 2019-08-13 DIAGNOSIS — M79604 Pain in right leg: Secondary | ICD-10-CM | POA: Insufficient documentation

## 2019-08-13 LAB — URINALYSIS, ROUTINE W REFLEX MICROSCOPIC
Bilirubin Urine: NEGATIVE
Glucose, UA: NEGATIVE mg/dL
Ketones, ur: NEGATIVE mg/dL
Nitrite: NEGATIVE
Protein, ur: NEGATIVE mg/dL
Specific Gravity, Urine: 1.013 (ref 1.005–1.030)
pH: 7 (ref 5.0–8.0)

## 2019-08-13 LAB — I-STAT CHEM 8, ED
BUN: 13 mg/dL (ref 8–23)
Calcium, Ion: 1.17 mmol/L (ref 1.15–1.40)
Chloride: 105 mmol/L (ref 98–111)
Creatinine, Ser: 0.7 mg/dL (ref 0.44–1.00)
Glucose, Bld: 75 mg/dL (ref 70–99)
HCT: 30 % — ABNORMAL LOW (ref 36.0–46.0)
Hemoglobin: 10.2 g/dL — ABNORMAL LOW (ref 12.0–15.0)
Potassium: 4.4 mmol/L (ref 3.5–5.1)
Sodium: 141 mmol/L (ref 135–145)
TCO2: 29 mmol/L (ref 22–32)

## 2019-08-13 MED ORDER — ACETAMINOPHEN 325 MG PO TABS
650.0000 mg | ORAL_TABLET | Freq: Once | ORAL | Status: AC
  Administered 2019-08-13: 650 mg via ORAL
  Filled 2019-08-13: qty 2

## 2019-08-13 MED ORDER — SULFAMETHOXAZOLE-TRIMETHOPRIM 800-160 MG PO TABS: 1.0000 | ORAL_TABLET | Freq: Two times a day (BID) | ORAL | 0 refills | Status: AC

## 2019-08-13 NOTE — ED Provider Notes (Signed)
[facility] DEPT Provider Note   CSN: [id number] Arrival date & time: 08/13/19  0731     History Chief Complaint  Patient presents with  . Leg Pain    Kathryn Whitaker is a 86 y.o. female.  86 year old female who presents with nursing home due to complaining of bilateral lower extremity pain.  Patient has history of dementia.  Per nursing home, patient may have had a fall in the past although it is unclear.  Patient tells me that she does not ambulate normally.  Patient states the pain is mostly on her feet.  EMS called and noted foul-smelling urine.  Transported here for further management        Past Medical History:  Diagnosis Date  . Acute asthmatic bronchitis   . Allergic rhinitis   . Alzheimer's dementia ([place])   . Anxiety   . Chronic constipation   . Diverticulosis of colon   . DJD (degenerative joint disease)   . GERD (gastroesophageal reflux disease)   . History of colonic polyps   . Hypercholesteremia   . Hypertension   . IBS (irritable bowel syndrome)   . Lacunar infarction ([place])   . Lumbar back pain   . Malnutrition ([place])   . UTI (lower urinary tract infection)   . Vitamin D deficiency     Patient Active Problem List   Diagnosis Date Noted  . AMS (altered mental status) 04/27/2019  . Hallucinations   . Acute blood loss anemia 01/15/2017  . Hip fracture, left ([place]) 01/12/2017  . Hip fracture ([place]) 01/12/2017  . S/p left hip fracture 01/12/2017  . Dementia with psychosis ([place]) 07/26/2016  . Dysuria 08/10/2015  . Hypothyroidism 06/06/2015  . Protein-calorie malnutrition, severe ([place]) 06/26/2014  . Constipation 12/17/2013  . COPD (chronic obstructive pulmonary disease) ([place]) 06/03/2013  . IBS (irritable bowel syndrome) 12/30/2012  . Generalized anxiety disorder 12/30/2012  . Dyslipidemia 12/12/2012  . HTN (hypertension) 12/12/2012  . Hx of completed stroke 07/11/2009    Past Surgical History:  Procedure Laterality  Date  . ABDOMINAL HYSTERECTOMY    . FEMUR IM NAIL Left 01/13/2017   Procedure: TROCHANTERIC INTRAMEDULLARY (IM) NAIL FEMORAL;  Surgeon: Renette Butters, MD;  Location: [facility];  Service: Orthopedics;  Laterality: Left;     OB History   No obstetric history on file.     Family History  Problem Relation Age of Onset  . Hypertension Other     Social History   Tobacco Use  . Smoking status: Never Smoker  . Smokeless tobacco: Never Used  Substance Use Topics  . Alcohol use: No    Alcohol/week: 0.0 standard drinks  . Drug use: No    Home Medications Prior to Admission medications   Medication Sig Start Date End Date Taking? Authorizing Provider  albuterol (PROVENTIL HFA) 108 (90 BASE) MCG/ACT inhaler Inhale 2 puffs into the lungs every 6 (six) hours as needed. For shortness of breath Patient not taking: Reported on 04/27/2019 06/27/14   Delfina Redwood, MD  aspirin 81 MG chewable tablet Chew 81 mg by mouth daily.    [provider]  famotidine (PEPCID) 20 MG tablet Take 1 tablet (20 mg total) by mouth 2 (two) times daily. For 30 days post op for gastroprotection. Patient not taking: Reported on 04/27/2019 01/14/17 07/08/18  Prudencio Burly III, PA-C  feeding supplement (BOOST / RESOURCE BREEZE) LIQD Take 1 Container by mouth 3 (three) times daily between meals. Patient not taking: Reported  on 07/08/2018 01/15/17   Janece Canterbury, MD  ferrous sulfate 325 (65 FE) MG tablet Take 325 mg by mouth daily with breakfast.    [provider]  gabapentin (NEURONTIN) 100 MG capsule Take 100 mg by mouth 2 (two) times daily.    [provider]  lactulose (CHRONULAC) 10 GM/15ML solution Take 20 g by mouth daily.    [provider]  lamoTRIgine (LAMICTAL) 25 MG tablet Take 3 tablets (75 mg total) by mouth 2 (two) times daily. 04/08/17   Ethelene Hal, NP  levothyroxine (SYNTHROID, LEVOTHROID) 50 MCG tablet TAKE 1 TABLET (50 MCG TOTAL) BY MOUTH DAILY  BEFORE BREAKFAST. 01/06/17   Golden Circle, FNP  loperamide (IMODIUM) 2 MG capsule Take 2 mg by mouth as needed for diarrhea or loose stools.    [provider]  vitamin B-12 (CYANOCOBALAMIN) 250 MCG tablet Take 500 mcg by mouth daily.     [provider]    Allergies    Demerol [meperidine], Influenza vaccines, Amoxicillin, Morphine and related, Peanuts [peanut oil], Prevpac [amoxicill-clarithro-lansopraz], and Telithromycin  Review of Systems   Review of Systems  Unable to perform ROS: Dementia    Physical Exam Updated Vital Signs BP (!) 148/78   Pulse 80   Temp 98.9 F (37.2 C) (Oral)   Resp 16   SpO2 96%   Physical Exam Vitals and nursing note reviewed.  Constitutional:      General: She is not in acute distress.    Appearance: Normal appearance. She is well-developed. She is not toxic-appearing.  HENT:     Head: Normocephalic and atraumatic.  Eyes:     General: Lids are normal.     Conjunctiva/sclera: Conjunctivae normal.     Pupils: Pupils are equal, round, and reactive to light.  Neck:     Thyroid: No thyroid mass.     Trachea: No tracheal deviation.  Cardiovascular:     Rate and Rhythm: Normal rate and regular rhythm.     Heart sounds: Normal heart sounds. No murmur. No gallop.   Pulmonary:     Effort: Pulmonary effort is normal. No respiratory distress.     Breath sounds: Normal breath sounds. No stridor. No decreased breath sounds, wheezing, rhonchi or rales.  Abdominal:     General: Bowel sounds are normal. There is no distension.     Palpations: Abdomen is soft.     Tenderness: There is no abdominal tenderness. There is no rebound.  Musculoskeletal:        General: No tenderness. Normal range of motion.     Cervical back: Normal range of motion and neck supple.     Comments: No shortening or rotation to lower extremities.  Patient neurovascular intact at both feet.  No obvious signs of trauma.  No bruising appreciated to her bilateral  lower extremities.  Patient's ankles, knees without evidence of trauma or swelling or erythema or warmness to touch  Skin:    General: Skin is warm and dry.     Findings: No abrasion or rash.  Neurological:     Mental Status: She is alert. Mental status is at baseline.     GCS: GCS eye subscore is 4. GCS verbal subscore is 5. GCS motor subscore is 6.     Cranial Nerves: No cranial nerve deficit.     Sensory: No sensory deficit.  Psychiatric:        Attention and Perception: Attention normal.        Speech:  Speech normal.     ED Results / Procedures / Treatments   Labs (all labs ordered are listed, but only abnormal results are displayed) Labs Reviewed  URINE CULTURE  URINALYSIS, ROUTINE W REFLEX MICROSCOPIC  COMPREHENSIVE METABOLIC PANEL    EKG None  Radiology No results found.  Procedures Procedures (including critical care time)  Medications Ordered in ED Medications  acetaminophen (TYLENOL) tablet 650 mg (has no administration in time range)    ED Course  I have reviewed the triage vital signs and the nursing notes.  Pertinent labs & imaging results that were available during my care of the patient were reviewed by me and considered in my medical decision making (see chart for details).    MDM Rules/Calculators/A&P                      Pelvis x-ray without acute findings.  She has no visible signs of trauma to her bilateral lower extremities.  Urinalysis does show infection.  Patient medicated for her pain with Motrin and does feel subtly better.  Electrolytes and hemoglobin stable.  Will place on antibiotics and discharged home Final Clinical Impression(s) / ED Diagnoses Final diagnoses:  None    Rx / DC Orders ED Discharge Orders    None       Lacretia Leigh, MD 08/13/19 1210

## 2019-08-13 NOTE — ED Notes (Signed)
Strong urine order from dirty sheet left under patient from facility. Peri-care done and purewick placed on patient

## 2019-08-13 NOTE — ED Triage Notes (Signed)
EMS reports from [facility], called out for leg pain since this morning, and reports urine odor.  BP 132/78 HR 76 RR 16 Sp02 98 RA CBG 107

## 2019-08-15 LAB — URINE CULTURE: Culture: >=100000 — AB

## 2019-08-16 ENCOUNTER — Telehealth: Payer: Self-pay | Admitting: Emergency Medicine

## 2019-08-16 NOTE — Telephone Encounter (Signed)
Post ED Visit - Positive Culture Follow-up  Culture report reviewed by antimicrobial stewardship pharmacist: [place] Team []  Elenor Quinones, Pharm.D. []  Heide Guile, Pharm.D., BCPS AQ-ID []  Parks Neptune, Pharm.D., BCPS []  Alycia Rossetti, Pharm.D., BCPS []  [place], [place].D., BCPS, AAHIVP []  Legrand Como, Pharm.D., BCPS, AAHIVP []  Salome Arnt, PharmD, BCPS []  Johnnette Gourd, PharmD, BCPS []  Hughes Better, PharmD, BCPS []  Leeroy Cha, PharmD []  Laqueta Linden, PharmD, BCPS []  Albertina Parr, PharmD  [place] Team []  Leodis Sias, PharmD []  Lindell Spar, PharmD []  Royetta Asal, PharmD []  Graylin Shiver, Rph []  Rema Fendt) Glennon Mac, PharmD []  Arlyn Dunning, PharmD []  Netta Cedars, PharmD []  Dia Sitter, PharmD []  Leone Haven, PharmD []  Gretta Arab, PharmD [x]  Theodis Shove, PharmD []  Peggyann Juba, PharmD []  Reuel Boom, PharmD   Positive urine culture Treated with sulfamethoxazole-trimethoprim, organism sensitive to the same and no further patient follow-up is required at this time.  Hazle Nordmann 08/16/2019, 12:57 PM

## 2020-01-29 ENCOUNTER — Emergency Department (HOSPITAL_COMMUNITY)

## 2020-01-29 ENCOUNTER — Inpatient Hospital Stay (HOSPITAL_COMMUNITY)
Admission: EM | Admit: 2020-01-29 | Discharge: 2020-01-31 | DRG: 689 | Disposition: A | Discharge status: Hospice | Attending: Internal Medicine | Admitting: Internal Medicine

## 2020-01-29 DIAGNOSIS — Z20822 Contact with and (suspected) exposure to covid-19: Secondary | ICD-10-CM | POA: Diagnosis present

## 2020-01-29 DIAGNOSIS — E039 Hypothyroidism, unspecified: Secondary | ICD-10-CM | POA: Diagnosis present

## 2020-01-29 DIAGNOSIS — Z79899 Other long term (current) drug therapy: Secondary | ICD-10-CM

## 2020-01-29 DIAGNOSIS — I119 Hypertensive heart disease without heart failure: Secondary | ICD-10-CM | POA: Diagnosis present

## 2020-01-29 DIAGNOSIS — F209 Schizophrenia, unspecified: Secondary | ICD-10-CM | POA: Diagnosis present

## 2020-01-29 DIAGNOSIS — K589 Irritable bowel syndrome without diarrhea: Secondary | ICD-10-CM | POA: Diagnosis present

## 2020-01-29 DIAGNOSIS — F319 Bipolar disorder, unspecified: Secondary | ICD-10-CM | POA: Diagnosis present

## 2020-01-29 DIAGNOSIS — E860 Dehydration: Secondary | ICD-10-CM

## 2020-01-29 DIAGNOSIS — Z888 Allergy status to other drugs, medicaments and biological substances status: Secondary | ICD-10-CM

## 2020-01-29 DIAGNOSIS — Z91010 Allergy to peanuts: Secondary | ICD-10-CM

## 2020-01-29 DIAGNOSIS — F0391 Unspecified dementia with behavioral disturbance: Secondary | ICD-10-CM | POA: Diagnosis not present

## 2020-01-29 DIAGNOSIS — Z79890 Hormone replacement therapy (postmenopausal): Secondary | ICD-10-CM

## 2020-01-29 DIAGNOSIS — E559 Vitamin D deficiency, unspecified: Secondary | ICD-10-CM | POA: Diagnosis present

## 2020-01-29 DIAGNOSIS — K5641 Fecal impaction: Secondary | ICD-10-CM | POA: Diagnosis present

## 2020-01-29 DIAGNOSIS — F0392 Unspecified dementia, unspecified severity, with psychotic disturbance: Secondary | ICD-10-CM | POA: Diagnosis present

## 2020-01-29 DIAGNOSIS — F419 Anxiety disorder, unspecified: Secondary | ICD-10-CM | POA: Diagnosis present

## 2020-01-29 DIAGNOSIS — Z887 Allergy status to serum and vaccine status: Secondary | ICD-10-CM

## 2020-01-29 DIAGNOSIS — G309 Alzheimer's disease, unspecified: Secondary | ICD-10-CM | POA: Diagnosis present

## 2020-01-29 DIAGNOSIS — R54 Age-related physical debility: Secondary | ICD-10-CM | POA: Diagnosis present

## 2020-01-29 DIAGNOSIS — N390 Urinary tract infection, site not specified: Secondary | ICD-10-CM | POA: Diagnosis present

## 2020-01-29 DIAGNOSIS — Z8673 Personal history of transient ischemic attack (TIA), and cerebral infarction without residual deficits: Secondary | ICD-10-CM

## 2020-01-29 DIAGNOSIS — Z7982 Long term (current) use of aspirin: Secondary | ICD-10-CM

## 2020-01-29 DIAGNOSIS — G9341 Metabolic encephalopathy: Secondary | ICD-10-CM | POA: Diagnosis present

## 2020-01-29 DIAGNOSIS — N952 Postmenopausal atrophic vaginitis: Secondary | ICD-10-CM | POA: Diagnosis present

## 2020-01-29 DIAGNOSIS — K5900 Constipation, unspecified: Secondary | ICD-10-CM

## 2020-01-29 DIAGNOSIS — Z885 Allergy status to narcotic agent status: Secondary | ICD-10-CM

## 2020-01-29 DIAGNOSIS — E162 Hypoglycemia, unspecified: Secondary | ICD-10-CM | POA: Diagnosis present

## 2020-01-29 DIAGNOSIS — Z880 Allergy status to penicillin: Secondary | ICD-10-CM

## 2020-01-29 DIAGNOSIS — E7800 Pure hypercholesterolemia, unspecified: Secondary | ICD-10-CM | POA: Diagnosis present

## 2020-01-29 DIAGNOSIS — Z79891 Long term (current) use of opiate analgesic: Secondary | ICD-10-CM

## 2020-01-29 DIAGNOSIS — B9620 Unspecified Escherichia coli [E. coli] as the cause of diseases classified elsewhere: Secondary | ICD-10-CM | POA: Diagnosis present

## 2020-01-29 DIAGNOSIS — K5730 Diverticulosis of large intestine without perforation or abscess without bleeding: Secondary | ICD-10-CM | POA: Diagnosis present

## 2020-01-29 DIAGNOSIS — Z66 Do not resuscitate: Secondary | ICD-10-CM | POA: Diagnosis present

## 2020-01-29 DIAGNOSIS — K219 Gastro-esophageal reflux disease without esophagitis: Secondary | ICD-10-CM | POA: Diagnosis present

## 2020-01-29 DIAGNOSIS — R338 Other retention of urine: Secondary | ICD-10-CM

## 2020-01-29 DIAGNOSIS — J449 Chronic obstructive pulmonary disease, unspecified: Secondary | ICD-10-CM | POA: Diagnosis present

## 2020-01-29 DIAGNOSIS — F0280 Dementia in other diseases classified elsewhere without behavioral disturbance: Secondary | ICD-10-CM | POA: Diagnosis present

## 2020-01-29 DIAGNOSIS — E43 Unspecified severe protein-calorie malnutrition: Secondary | ICD-10-CM | POA: Diagnosis present

## 2020-01-29 DIAGNOSIS — Z8249 Family history of ischemic heart disease and other diseases of the circulatory system: Secondary | ICD-10-CM

## 2020-01-29 LAB — URINALYSIS, ROUTINE W REFLEX MICROSCOPIC
Bilirubin Urine: NEGATIVE
Glucose, UA: 50 mg/dL — AB
Ketones, ur: NEGATIVE mg/dL
Nitrite: POSITIVE — AB
Protein, ur: 100 mg/dL — AB
Specific Gravity, Urine: 1.016 (ref 1.005–1.030)
WBC, UA: >50 WBC/hpf — ABNORMAL HIGH (ref 0–5)
pH: 8 (ref 5.0–8.0)

## 2020-01-29 LAB — SARS CORONAVIRUS 2 BY RT PCR (HOSPITAL ORDER, PERFORMED IN ~~LOC~~ HOSPITAL LAB): SARS Coronavirus 2: NEGATIVE

## 2020-01-29 LAB — CBC WITH DIFFERENTIAL/PLATELET
Abs Immature Granulocytes: 0.04 10*3/uL (ref 0.00–0.07)
Basophils Absolute: 0.1 10*3/uL (ref 0.0–0.1)
Basophils Relative: 1 %
Eosinophils Absolute: 0.3 10*3/uL (ref 0.0–0.5)
Eosinophils Relative: 6 %
HCT: 38.1 % (ref 36.0–46.0)
Hemoglobin: 11.8 g/dL — ABNORMAL LOW (ref 12.0–15.0)
Immature Granulocytes: 1 %
Lymphocytes Relative: 38 %
Lymphs Abs: 1.9 10*3/uL (ref 0.7–4.0)
MCH: 30 pg (ref 26.0–34.0)
MCHC: 31 g/dL (ref 30.0–36.0)
MCV: 96.9 fL (ref 80.0–100.0)
Monocytes Absolute: 0.7 10*3/uL (ref 0.1–1.0)
Monocytes Relative: 14 %
Neutro Abs: 2 10*3/uL (ref 1.7–7.7)
Neutrophils Relative %: 40 %
Platelets: 150 10*3/uL (ref 150–400)
RBC: 3.93 MIL/uL (ref 3.87–5.11)
RDW: 13.5 % (ref 11.5–15.5)
WBC: 5 10*3/uL (ref 4.0–10.5)
nRBC: 0 % (ref 0.0–0.2)

## 2020-01-29 LAB — RAPID URINE DRUG SCREEN, HOSP PERFORMED
Amphetamines: NOT DETECTED
Barbiturates: NOT DETECTED
Benzodiazepines: NOT DETECTED
Cocaine: NOT DETECTED
Opiates: NOT DETECTED
Tetrahydrocannabinol: NOT DETECTED

## 2020-01-29 LAB — GLUCOSE, CAPILLARY
Glucose-Capillary: 67 mg/dL — ABNORMAL LOW (ref 70–99)
Glucose-Capillary: 137 mg/dL — ABNORMAL HIGH (ref 70–99)
Glucose-Capillary: 123 mg/dL — ABNORMAL HIGH (ref 70–99)

## 2020-01-29 LAB — BASIC METABOLIC PANEL
Anion gap: 8 (ref 5–15)
BUN: 18 mg/dL (ref 8–23)
CO2: 29 mmol/L (ref 22–32)
Calcium: 9.2 mg/dL (ref 8.9–10.3)
Chloride: 105 mmol/L (ref 98–111)
Creatinine, Ser: 0.88 mg/dL (ref 0.44–1.00)
GFR calc Af Amer: >60 mL/min (ref >60)
GFR calc non Af Amer: 55 mL/min — ABNORMAL LOW (ref >60)
Glucose, Bld: 67 mg/dL — ABNORMAL LOW (ref 70–99)
Potassium: 4.2 mmol/L (ref 3.5–5.1)
Sodium: 142 mmol/L (ref 135–145)

## 2020-01-29 LAB — CBG MONITORING, ED
Glucose-Capillary: 52 mg/dL — ABNORMAL LOW (ref 70–99)
Glucose-Capillary: 60 mg/dL — ABNORMAL LOW (ref 70–99)

## 2020-01-29 MED ORDER — DEXTROSE-NACL 5-0.45 % IV SOLN: INTRAVENOUS | Status: AC

## 2020-01-29 MED ORDER — SODIUM CHLORIDE 0.9 % IV SOLN: INTRAVENOUS | Status: DC

## 2020-01-29 MED ORDER — SODIUM CHLORIDE 0.9 % IV SOLN
1.0000 g | INTRAVENOUS | Status: DC
  Administered 2020-01-29 – 2020-01-30 (×2): 1 g via INTRAVENOUS
  Filled 2020-01-29 (×3): qty 10

## 2020-01-29 MED ORDER — SORBITOL 70 % SOLN
960.0000 mL | TOPICAL_OIL | Freq: Once | ORAL | Status: DC
  Filled 2020-01-29: qty 473

## 2020-01-29 MED ORDER — ONDANSETRON HCL 4 MG PO TABS: 4.0000 mg | ORAL_TABLET | Freq: Four times a day (QID) | ORAL | Status: DC | PRN

## 2020-01-29 MED ORDER — ONDANSETRON HCL 4 MG/2ML IJ SOLN: 4.0000 mg | Freq: Four times a day (QID) | INTRAMUSCULAR | Status: DC | PRN

## 2020-01-29 MED ORDER — SODIUM CHLORIDE 0.9 % IV BOLUS
500.0000 mL | Freq: Once | INTRAVENOUS | Status: AC
  Administered 2020-01-29: 500 mL via INTRAVENOUS

## 2020-01-29 MED ORDER — FLEET ENEMA 7-19 GM/118ML RE ENEM: 1.0000 | ENEMA | Freq: Once | RECTAL | Status: DC | PRN

## 2020-01-29 MED ORDER — GLUCOSE 40 % PO GEL
1.0000 | Freq: Once | ORAL | Status: AC
  Administered 2020-01-29: 37.5 g via ORAL
  Filled 2020-01-29: qty 1

## 2020-01-29 MED ORDER — POLYETHYLENE GLYCOL 3350 17 G PO PACK
17.0000 g | PACK | Freq: Two times a day (BID) | ORAL | Status: DC
  Administered 2020-01-30 (×2): 17 g via ORAL
  Filled 2020-01-29 (×2): qty 1

## 2020-01-29 MED ORDER — DEXTROSE 50 % IV SOLN
INTRAVENOUS | Status: AC
  Administered 2020-01-29: 50 mL
  Filled 2020-01-29: qty 50

## 2020-01-29 MED ORDER — DEXTROSE 50 % IV SOLN
50.0000 mL | Freq: Once | INTRAVENOUS | Status: AC
  Administered 2020-01-29: 50 mL via INTRAVENOUS
  Filled 2020-01-29: qty 50

## 2020-01-29 MED ORDER — ENOXAPARIN SODIUM 40 MG/0.4ML ~~LOC~~ SOLN
40.0000 mg | SUBCUTANEOUS | Status: DC
  Administered 2020-01-30: 40 mg via SUBCUTANEOUS
  Filled 2020-01-29: qty 0.4

## 2020-01-29 NOTE — ED Provider Notes (Signed)
Kathryn Whitaker   CSN: [id number] Arrival date & time: 01/29/20  7412     History Chief Complaint  Patient presents with  . Altered Mental Status    Kathryn Whitaker is a 86 y.o. female.  HPI She presents for evaluation from her nursing care facility because of decreased responsiveness, decreased oral intake, and recent combativeness which was treated with an unknown medication.  She is with her daughter who gives the history.  Daughter sees her periodically and feels like the patient is somewhat less responsive than usual, but since arrival in the ED she has perked up somewhat.  Daughter states the patient has lost about 30 pounds in last several months, unintentionally.  Patient stays in bed or wheelchair, and requires total help with ADLs.  She is no CODE BLUE.  Level 5 caveat-dementia    Past Medical History:  Diagnosis Date  . Acute asthmatic bronchitis   . Allergic rhinitis   . Alzheimer's dementia ([place])   . Anxiety   . Chronic constipation   . Diverticulosis of colon   . DJD (degenerative joint disease)   . GERD (gastroesophageal reflux disease)   . History of colonic polyps   . Hypercholesteremia   . Hypertension   . IBS (irritable bowel syndrome)   . Lacunar infarction ([place])   . Lumbar back pain   . Malnutrition ([place])   . UTI (lower urinary tract infection)   . Vitamin D deficiency     Patient Active Problem List   Diagnosis Date Noted  . AMS (altered mental status) 04/27/2019  . Hallucinations   . Acute blood loss anemia 01/15/2017  . Hip fracture, left ([place]) 01/12/2017  . Hip fracture ([place]) 01/12/2017  . S/p left hip fracture 01/12/2017  . Dementia with psychosis ([place]) 07/26/2016  . Dysuria 08/10/2015  . Hypothyroidism 06/06/2015  . Protein-calorie malnutrition, severe ([place]) 06/26/2014  . Constipation 12/17/2013  . COPD (chronic obstructive pulmonary disease) ([place]) 06/03/2013  . IBS (irritable bowel  syndrome) 12/30/2012  . Generalized anxiety disorder 12/30/2012  . Dyslipidemia 12/12/2012  . HTN (hypertension) 12/12/2012  . Hx of completed stroke 07/11/2009    Past Surgical History:  Procedure Laterality Date  . ABDOMINAL HYSTERECTOMY    . FEMUR IM NAIL Left 01/13/2017   Procedure: TROCHANTERIC INTRAMEDULLARY (IM) NAIL FEMORAL;  Surgeon: Renette Butters, MD;  Location: [facility];  Service: Orthopedics;  Laterality: Left;     OB History   No obstetric history on file.     Family History  Problem Relation Age of Onset  . Hypertension Other     Social History   Tobacco Use  . Smoking status: Never Smoker  . Smokeless tobacco: Never Used  Vaping Use  . Vaping Use: Never used  Substance Use Topics  . Alcohol use: No    Alcohol/week: 0.0 standard drinks  . Drug use: No    Home Medications Prior to Admission medications   Medication Sig Start Date End Date Taking? Authorizing Provider  acetaminophen (TYLENOL) 500 MG tablet Take 500 mg by mouth every 4 (four) hours as needed for mild pain, fever or headache.   Yes [provider]  albuterol (PROVENTIL HFA) 108 (90 BASE) MCG/ACT inhaler Inhale 2 puffs into the lungs every 6 (six) hours as needed. For shortness of breath 06/27/14  Yes Delfina Redwood, MD  alum & mag hydroxide-simeth ([place]) 200-200-20 MG/5ML suspension Take 30 mLs by mouth every 6 (six) hours  as needed for indigestion or heartburn.   Yes [provider]  aspirin 81 MG chewable tablet Chew 81 mg by mouth daily.   Yes [provider]  Chloroxylenol-Zinc Oxide (BAZA EX) Apply 1 application topically See admin instructions. Apply to buttocks bid and after each incontinence episode   Yes [provider]  gabapentin (NEURONTIN) 100 MG capsule Take 100 mg by mouth 2 (two) times daily.   Yes [provider]  guaifenesin (ROBITUSSIN) 100 MG/5ML syrup Take 200 mg by mouth every 6 (six) hours as needed for  cough.   Yes [provider]  haloperidol (HALDOL) 2 MG/ML solution Take 0.25 mg by mouth 3 (three) times daily.    Yes [provider]  lamoTRIgine (LAMICTAL) 25 MG tablet Take 3 tablets (75 mg total) by mouth 2 (two) times daily. Patient taking differently: Take 50 mg by mouth 2 (two) times daily.  04/08/17  Yes Ethelene Hal, NP  levothyroxine (SYNTHROID, LEVOTHROID) 50 MCG tablet TAKE 1 TABLET (50 MCG TOTAL) BY MOUTH DAILY BEFORE BREAKFAST. 01/06/17  Yes Golden Circle, FNP  loperamide (IMODIUM) 2 MG capsule Take 2 mg by mouth as needed for diarrhea or loose stools.   Yes [provider]  magnesium hydroxide (MILK OF MAGNESIA) 400 MG/5ML suspension Take 30 mLs by mouth at bedtime as needed for mild constipation.   Yes [provider]  neomycin-bacitracin-polymyxin (NEOSPORIN) [phone] ointment Apply 1 application topically daily as needed (skin abrasions).   Yes [provider]  PRESCRIPTION MEDICATION Take 1 each by mouth in the morning, at noon, and at bedtime. Mighty shake   Yes [provider]  senna (SENOKOT) 8.6 MG TABS tablet Take 2 tablets by mouth daily.   Yes [provider]  traMADol (ULTRAM) 50 MG tablet Take 50 mg by mouth 2 (two) times daily.   Yes [provider]  trolamine salicylate (ASPERCREME) 10 % cream Apply 1 application topically in the morning and at bedtime. Right knee   Yes [provider]  feeding supplement (BOOST / RESOURCE BREEZE) LIQD Take 1 Container by mouth 3 (three) times daily between meals. Patient not taking: Reported on 07/08/2018 01/15/17   Janece Canterbury, MD    Allergies    Demerol [meperidine], Influenza vaccines, Amoxicillin, Morphine and related, Peanuts [peanut oil], Prevpac [amoxicill-clarithro-lansopraz], and Telithromycin  Review of Systems   Review of Systems  Unable to perform ROS: Dementia    Physical Exam Updated Vital Signs BP (!) 100/59    Pulse (!) 57   Temp 97.8 F (36.6 C) (Oral)   Resp 17   Ht 5\' 2"  (1.575 m)   Wt 65 kg   SpO2 100%   BMI 26.21 kg/m   Physical Exam Vitals and nursing Whitaker reviewed.  Constitutional:      General: She is not in acute distress.    Appearance: She is well-developed. She is not ill-appearing, toxic-appearing or diaphoretic.     Comments: Elderly, frail  HENT:     Head: Normocephalic and atraumatic.     Right Ear: External ear normal.     Left Ear: External ear normal.  Eyes:     Conjunctiva/sclera: Conjunctivae normal.     Pupils: Pupils are equal, round, and reactive to light.  Neck:     Trachea: Phonation normal.  Cardiovascular:     Rate and Rhythm: Normal rate and regular rhythm.     Heart sounds: Normal heart sounds.  Pulmonary:     Effort:  Pulmonary effort is normal.     Breath sounds: Normal breath sounds.  Abdominal:     General: There is no distension.     Palpations: Abdomen is soft. There is no mass.     Tenderness: There is abdominal tenderness (Diffuse, moderate). There is no rebound.     Hernia: No hernia is present.  Musculoskeletal:        General: No swelling, tenderness or deformity. Normal range of motion.     Cervical back: Normal range of motion and neck supple.     Right lower leg: No edema.     Left lower leg: No edema.  Skin:    General: Skin is warm and dry.  Neurological:     Mental Status: She is alert.     Motor: No abnormal muscle tone.     Comments: Mild flexion contractures, arms and legs bilaterally.  She does not follow commands for movement of extremities.  No facial asymmetry.  No verbalization.  Patient seems aware of touch, but does not respond much.   Psychiatric:     Comments: Lethargic     ED Results / Procedures / Treatments   Labs (all labs ordered are listed, but only abnormal results are displayed) Labs Reviewed  BASIC METABOLIC PANEL - Abnormal; Notable for the following components:      Result Value   Glucose, Bld 67  (*)    GFR calc non Af Amer 55 (*)    All other components within normal limits  CBC WITH DIFFERENTIAL/PLATELET - Abnormal; Notable for the following components:   Hemoglobin 11.8 (*)    All other components within normal limits  URINALYSIS, ROUTINE W REFLEX MICROSCOPIC  RAPID URINE DRUG SCREEN, HOSP PERFORMED    EKG None  Radiology No results found.  Procedures Procedures (including critical care time)  Medications Ordered in ED Medications - No data to display  ED Course  I have reviewed the triage vital signs and the nursing notes.  Pertinent labs & imaging results that were available during my care of the patient were reviewed by me and considered in my medical decision making (see chart for details).  Clinical Course as of Jan 28 1329  Sat Jan 29, 2020  0903 Normal except hemoglobin slightly low  CBC with Differential(!) [EW]  1034 Normal except glucose low  Basic metabolic panel(!) [EW]  6195 Abnormal, low  POC CBG, ED(!) [EW]  1208 Per radiologist, no acute abnormality-fecal impaction and increased stool burden, remainder of abnormal findings are chronic.   [EW]  1209 I assisted nursing with attempt to catheterize patient.  It appears that there is a foreign body in the urethral meatus, that cannot be dislodged, manually or by attempting to insert the in and out catheter.  I have discussed this with urology who will come to the ED, to help with catheterization and analysis of possible meatal foreign body.   [EW]  1253 Persistent hypoglycemia treated with D50, IV fluids ordered for suspected dehydration.   [EW]  [id number] Urology has seen the patient and placed a coud catheter with return of urine.  CT Abdomen Pelvis Wo Contrast [EW]    Clinical Course User Index [EW] Daleen Bo, MD   MDM Rules/Calculators/A&P                           Patient Vitals for the past 24 hrs:  BP Temp Temp src Pulse Resp SpO2 Height Weight  01/29/20 0800 (!) 100/59 -- -- (!) 57  17 100 % -- --  01/29/20 0739 (!) 119/51 97.8 F (36.6 C) Oral (!) 57 11 100 % -- --  01/29/20 0653 -- -- -- -- -- -- 5\' 2"  (1.575 m) 65 kg  01/29/20 0646 134/67 97.7 F (36.5 C) Oral (!) 58 16 97 % -- --    1:30 PM Reevaluation with update and discussion. After initial assessment and treatment, an updated evaluation reveals she is resting comfortably.  Foley catheter, draining cloudy urine into collection receptacle.  Daughter updated on findings and plan.  We discussed that the patient is near end-of-life, and may not survive this current progressive decline.  Daughter is not ready to pull back completely, and give comfort care only.  MOST form currently indicates that some interventions are acceptable.  The patient's POA, is her son, who lives in [place], and has not seen the patient for about a week and a half.  I encouraged the patient's daughter to have a son and other family members that would like to be involved with decision making, to come to the hospital to meet with the patient's care providers and discuss ongoing planning and treatment potentiality's. Daleen Bo   Medical Decision Making:  This patient is presenting for evaluation of nonspecific symptoms including sleepiness, decreased oral intake for couple weeks, and moderate weight loss over several months, which does require a range of treatment options, and is a complaint that involves a moderate risk of morbidity and mortality. The differential diagnoses include malaise, process of dying, urinary tract infection, metabolic instability. I decided to review old records, and in summary elderly female, with gradual decline for several months, worsening for 2 weeks.  No clear etiology.  I obtained additional historical information from her daughter at bedside.  Clinical Laboratory Tests Ordered, included CBC, Metabolic panel, Urinalysis and Covid test. Review indicates normal except patient hypoglycemic, recurrently. Radiologic  Tests Ordered, included CT abdomen pelvis.  I independently Visualized: Radiographic images, which show constipation with fecal impaction   Critical Interventions-clinical evaluation, laboratory testing, CT imaging, attempt to catheterize urinary bladder, treatment of hypoglycemia, reassessment  After These Interventions, the Patient was reevaluated and was found to require hospitalization for persistent hypoglycemia.  Patient with constipation and fecal impaction, likely causing decreased oral intake leading to hyperglycemia.  Urinary catheter placed by urology for difficult anatomy/possible urethral obstruction.  Doubt serious bacterial infection or metabolic instability.  CRITICAL CARE-yes Performed by: Daleen Bo  Nursing Notes Reviewed/ Care Coordinated Applicable Imaging Reviewed Interpretation of Laboratory Data incorporated into ED treatment   1:32 PM-Consult complete with hospitalist. Patient case explained and discussed.  He agrees to admit patient for further evaluation and treatment. Call ended at 1:50 PM  Plan: Admit    Final Clinical Impression(s) / ED Diagnoses Final diagnoses:  None    Rx / DC Orders ED Discharge Orders    None       Daleen Bo, MD 01/29/20 1355

## 2020-01-29 NOTE — H&P (Addendum)
[facility] History and Physical  Kathryn Whitaker JKK:[id number] DOB: 1919/07/13 DOA: 01/29/2020   PCP: Hoyt Koch, MD  Specialists: None  Chief Complaint: Poorly responsive  HPI: Kathryn Whitaker is a 86 y.o. female who lives in a nursing facility and who has a past medical history of dementia with psychosis, hypothyroidism, history of irritable bowel syndrome, history of UTIs who was sent here to the emergency department as patient was poorly responsive this morning.  Daughter is at the bedside and provided most of the information.  Patient is arousable but does not really answer many questions.  According to the daughter patient has been progressively getting worse for the past 2 to 3 months.  She has lost a lot of weight.  Her appetite has been poor.  There has been no reports of any nausea or vomiting.  No diarrhea.  She has had some constipation.  Patient has been denying any pain.  She also developed contractures of her upper extremity.  She has not been really able to walk for the last year or so.  Gets around in a wheelchair.  In the emergency department patient was noted to have an abnormal UA, hypoglycemia, fecal impaction.  It also appears that patient is under hospice care at the nursing facility.  She will be hospitalized for further management.  Home Medications: Prior to Admission medications   Medication Sig Start Date End Date Taking? Authorizing Provider  acetaminophen (TYLENOL) 500 MG tablet Take 500 mg by mouth every 4 (four) hours as needed for mild pain, fever or headache.   Yes [provider]  albuterol (PROVENTIL HFA) 108 (90 BASE) MCG/ACT inhaler Inhale 2 puffs into the lungs every 6 (six) hours as needed. For shortness of breath 06/27/14  Yes Delfina Redwood, MD  alum & mag hydroxide-simeth (MINTOX REGULAR STRENGTH) 200-200-20 MG/5ML suspension Take 30 mLs by mouth every 6 (six) hours as needed for indigestion or heartburn.   Yes [provider]  aspirin 81 MG chewable tablet Chew 81 mg by mouth daily.   Yes [provider]  Chloroxylenol-Zinc Oxide (BAZA EX) Apply 1 application topically See admin instructions. Apply to buttocks bid and after each incontinence episode   Yes [provider]  gabapentin (NEURONTIN) 100 MG capsule Take 100 mg by mouth 2 (two) times daily.   Yes [provider]  guaifenesin (ROBITUSSIN) 100 MG/5ML syrup Take 200 mg by mouth every 6 (six) hours as needed for cough.   Yes [provider]  haloperidol (HALDOL) 2 MG/ML solution Take 0.25 mg by mouth 3 (three) times daily.    Yes [provider]  lamoTRIgine (LAMICTAL) 25 MG tablet Take 3 tablets (75 mg total) by mouth 2 (two) times daily. Patient taking differently: Take 50 mg by mouth 2 (two) times daily.  04/08/17  Yes Ethelene Hal, NP  levothyroxine (SYNTHROID, LEVOTHROID) 50 MCG tablet TAKE 1 TABLET (50 MCG TOTAL) BY MOUTH DAILY BEFORE BREAKFAST. 01/06/17  Yes Golden Circle, FNP  loperamide (IMODIUM) 2 MG capsule Take 2 mg by mouth as needed for diarrhea or loose stools.   Yes [provider]  magnesium hydroxide (MILK OF MAGNESIA) 400 MG/5ML suspension Take 30 mLs by mouth at bedtime as needed for mild constipation.   Yes [provider]  neomycin-bacitracin-polymyxin (NEOSPORIN) [phone] ointment Apply 1 application topically daily as needed (skin abrasions).   Yes [provider]  PRESCRIPTION MEDICATION Take 1 each by mouth in the morning,  at noon, and at bedtime. Mighty shake   Yes [provider]  senna (SENOKOT) 8.6 MG TABS tablet Take 2 tablets by mouth daily.   Yes [provider]  traMADol (ULTRAM) 50 MG tablet Take 50 mg by mouth 2 (two) times daily.   Yes [provider]  trolamine salicylate (ASPERCREME) 10 % cream Apply 1 application topically in the morning and at bedtime. Right knee   Yes [provider]    feeding supplement (BOOST / RESOURCE BREEZE) LIQD Take 1 Container by mouth 3 (three) times daily between meals. Patient not taking: Reported on 07/08/2018 01/15/17   Janece Canterbury, MD    Allergies:  Allergies  Allergen Reactions  . Demerol [Meperidine]     Pt states she can't remember type of reaction  . Influenza Vaccines     Pt can't remember reaction but states allergic and has not had in 25 years  . Amoxicillin     PER MAR  . Morphine And Related Dermatitis  . Peanuts [Peanut Oil]     Pt reports she is allergic to peanuts, but was asking to eat peanut butter. Pt unsure of her reaction.   [name] [Amoxicill-Clarithro-Lansopraz] Hives    .Marland KitchenHas patient had a PCN reaction causing immediate rash, facial/tongue/throat swelling, SOB or lightheadedness with hypotension: unknown Has patient had a PCN reaction causing severe rash involving mucus membranes or skin necrosis: unknown Has patient had a PCN reaction that required hospitalization unknown Has patient had a PCN reaction occurring within the last 10 years: unknown If all of the above answers are "NO", then may proceed with Cephalosporin use.   . Telithromycin Hives    REACTION: pt states HIVES....    Past Medical History: Past Medical History:  Diagnosis Date  . Acute asthmatic bronchitis   . Allergic rhinitis   . Alzheimer's dementia ([place])   . Anxiety   . Chronic constipation   . Diverticulosis of colon   . DJD (degenerative joint disease)   . GERD (gastroesophageal reflux disease)   . History of colonic polyps   . Hypercholesteremia   . Hypertension   . IBS (irritable bowel syndrome)   . Lacunar infarction ([place])   . Lumbar back pain   . Malnutrition ([place])   . UTI (lower urinary tract infection)   . Vitamin D deficiency     Past Surgical History:  Procedure Laterality Date  . ABDOMINAL HYSTERECTOMY    . FEMUR IM NAIL Left 01/13/2017   Procedure: TROCHANTERIC INTRAMEDULLARY (IM) NAIL FEMORAL;  Surgeon:  Renette Butters, MD;  Location: [facility];  Service: Orthopedics;  Laterality: Left;    Social History: Lives in a skilled nursing facility.  Family History:  Family History  Problem Relation Age of Onset  . Hypertension Other      Review of Systems -unable to do due to her dementia  Physical Examination  Vitals:   01/29/20 1400 01/29/20 1430 01/29/20 1500 01/29/20 1538  BP: 133/64 123/72 122/68 128/65  Pulse: 61 (!) 55 (!) 56 (!) 58  Resp: 15 13 15 11   Temp:    97.8 F (36.6 C)  TempSrc:    Oral  SpO2: 100% 100% 100% 100%  Weight:      Height:        BP 128/65 (BP Location: Right Arm)   Pulse (!) 58   Temp 97.8 F (36.6 C) (Oral)   Resp 11   Ht 5\' 2"  (1.575 m)   Wt 65 kg  SpO2 100%   BMI 26.21 kg/m   General appearance: Somnolent.  Somewhat arousable. Head: Normocephalic, without obvious abnormality, atraumatic Eyes:  Patient was very reluctant to open her eyes.  Pupils seem equal and reactive. conjunctivae/corneas clear.  Neck: no adenopathy, no carotid bruit, no JVD, supple, symmetrical, trachea midline and thyroid not enlarged, symmetric, no tenderness/mass/nodules Resp: clear to auscultation bilaterally Cardio: regular rate and rhythm, S1, S2 normal, no murmur, click, rub or gallop GI: soft, non-tender; bowel sounds normal; no masses,  no organomegaly Extremities: Contracted upper extremities noted. Pulses: 2+ and symmetric Skin: Skin color, texture, turgor normal. No rashes or lesions Lymph nodes: Cervical, supraclavicular, and axillary nodes normal. Neurologic: Somnolent but does wake up.  Confused.  No obvious deficits.  Contracted upper extremities.  She does wiggle her toes.   Labs on Admission: I have personally reviewed following labs and imaging studies  CBC: Recent Labs  Lab 01/29/20 0817  WBC 5.0  NEUTROABS 2.0  HGB 11.8*  HCT 38.1  MCV 96.9  PLT 527   Basic Metabolic Panel: Recent Labs  Lab 01/29/20 0817  NA 142  K 4.2  CL 105    CO2 29  GLUCOSE 67*  BUN 18  CREATININE 0.88  CALCIUM 9.2   GFR: Estimated Creatinine Clearance: 31.6 mL/min (by C-G formula based on SCr of 0.88 mg/dL).  CBG: Recent Labs  Lab 01/29/20 1043 01/29/20 1203  GLUCAP 52* 60*     Radiological Exams on Admission: CT Abdomen Pelvis Wo Contrast  Result Date: 01/29/2020 CLINICAL DATA:  Abdominal pain, acute and non localized. EXAM: CT ABDOMEN AND PELVIS WITHOUT CONTRAST TECHNIQUE: Multidetector CT imaging of the abdomen and pelvis was performed following the standard protocol without IV contrast. COMPARISON:  06/03/2015 FINDINGS: Lower chest: Trace bilateral pleural effusions. Dependent atelectasis identified at the lung bases. The heart is mildly enlarged. There is coronary artery calcification. Atherosclerotic calcification of the root of the aorta. No pericardial effusion. Hepatobiliary: Stable cyst within the dome of the LEFT hepatic lobe is 2.0 centimeters. No suspicious hepatic lesion. Status post cholecystectomy. Pancreas: Unremarkable. No pancreatic ductal dilatation or surrounding inflammatory changes. Spleen: Normal in size without focal abnormality. Adrenals/Urinary Tract: Adrenal glands are normal. Multiple bilateral renal cysts appear stable. Largest is on the RIGHT, measuring 5.0 centimeters. No hydronephrosis. Ureters are unremarkable. Focal calcifications in the renal hilar regions are favored to represent atherosclerotic calcification of the renal arteries. Small intrarenal calculi cannot be excluded. Stomach/Bowel: There is fecal impaction of the sigmoid colon. Sigmoid is distended to 8.4 centimeters by a large amount of stool. Large stool burden throughout the colon. There are numerous diverticula but no evidence for acute diverticulitis. The appendix is well seen and has a normal appearance. Stomach and small bowel loops are unremarkable. Vascular/Lymphatic: There is significant atherosclerotic calcification of the abdominal aorta  not associated with aneurysm. No retroperitoneal or mesenteric adenopathy. Reproductive: Hysterectomy.  No adnexal mass. Other: No ascites.  Anterior abdominal wall is unremarkable. Musculoskeletal: Bones appear lucent. Scoliosis and degenerative changes in the spine. No acute fracture. Remote LEFT hip ORIF. IMPRESSION: 1. Fecal impaction of the sigmoid colon. Sigmoid is distended to 8.4 centimeters by stool. 2. Large stool burden throughout the colon. 3. Colonic diverticulosis without evidence for acute diverticulitis. 4. Normal appendix. 5. Trace bilateral pleural effusions and bibasilar atelectasis. 6. Cardiomegaly and coronary artery disease. 7. Stable appearance of hepatic and renal cysts. 8. Bones appear lucent.  Suspect osteopenia/osteoporosis. 9. Aortic Atherosclerosis (ICD10-I70.0). Electronically Signed   By:  Nolon Nations M.D.   On: 01/29/2020 10:03      Problem List  Principal Problem:   UTI (urinary tract infection) Active Problems:   Protein-calorie malnutrition, severe ([place])   Dementia with psychosis ([place])   Fecal impaction ([place])   Acute metabolic encephalopathy   Assessment: This is a 86 year old African-American female with past medical history as stated earlier who comes in from skilled nursing facility due to unresponsiveness.  She has advanced dementia with psychosis.  Evaluation in the ED has raised concern for UTI and significant constipation with fecal impaction.  She was also noted to be hypoglycemic possibly due to poor oral intake.  She has multiple reasons for her presentation.  However at the same time patient has been declining in terms of her general health for the last 2 to 3 months.  Plan:  1. Acute metabolic encephalopathy: This is in the setting of dementia with psychosis. Her current alteration in mental status seems to be due to a combination of hypoglycemia as well as UTI and constipation.  She will be treated for these conditions.  She does not have any  focal deficits that I can determine.  2.  Urinary tract infection: No clear evidence for sepsis currently.  Place her on ceftriaxone.  There was some difficulty with doing in and out catheterization in the emergency department.  So urology was called and they did manage to place a coud catheter.  Fecal impaction was the likely reason for this difficulty.  Leave catheter in for now.  3.  Severe constipation with fecal impaction: She will be given enemas.  Manual disimpaction. he has a known history of hypothyroidism.  Will check TSH and free T4.  She will need comprehensive bowel regimen once she is more awake alert and able to take orally.  4.  Hypoglycemia: No known history of diabetes.  Low glucose levels could be due to poor oral intake.  We will place her on D5 infusion.  Check her CBGs every few hours.  Once she is more awake alert she can be given something to eat by mouth.  5. Hypothyroidism: See above.  Check TSH and free T4.  6.  History of dementia with psychosis: Per daughter she has been progressively getting worse however has been able to have lucid conversations at times.  Noted to be on Topamax at home primarily for her psych issue according to the daughter.  Daughter denies any history of seizures.  7.  Goals of care: This was discussed in detail with patient's daughter.  Patient appears to be declining over the last 2 to 3 months.  All of these could potentially be end-of-life issues.  However daughter wanted [place] to try to treat any reversible condition before we start conversations about comfort care and such. Patient apparently is already followed by hospice services at the skilled nursing facility.  We will involve palliative care to assist with goals of care and disposition depending on how the patient does over the next 24 to 48 hours.  Patient is already DNR. In the meantime she will speak with patient's son, who is the POA. He is not in town.   DVT Prophylaxis: Lovenox Code  Status: DNR Family Communication: Discussed with the daughter.  Apparently patient's son, who lives in [place], is the healthcare power of attorney. Disposition: From skilled nursing facility Consults called: Palliative care Admission Status: Status is: Inpatient  Remains inpatient appropriate because:Altered mental status, IV treatments appropriate due to intensity of  illness or inability to take PO and Inpatient level of care appropriate due to severity of illness   Dispo: The patient is from: SNF              Anticipated d/c is to: SNF              Anticipated d/c date is: 2 days              Patient currently is not medically stable to d/c.    Severity of Illness: The appropriate patient status for this patient is INPATIENT. Inpatient status is judged to be reasonable and necessary in order to provide the required intensity of service to ensure the patient's safety. The patient's presenting symptoms, physical exam findings, and initial radiographic and laboratory data in the context of their chronic comorbidities is felt to place them at high risk for further clinical deterioration. Furthermore, it is not anticipated that the patient will be medically stable for discharge from the hospital within 2 midnights of admission. The following factors support the patient status of inpatient.   " The patient's presenting symptoms include altered mental status. " The worrisome physical exam findings include unresponsiveness. " The initial radiographic and laboratory data are worrisome because of UTI, severe constipation. " The chronic co-morbidities include dementia.   * I certify that at the point of admission it is my clinical judgment that the patient will require inpatient hospital care spanning beyond 2 midnights from the point of admission due to high intensity of service, high risk for further deterioration and high frequency of surveillance required.*  Further management decisions will  depend on results of further testing and patient's response to treatment.   Lanis Storlie [employer]  [facility] operational officer on [email or web address]  01/29/2020, 4:10 PM

## 2020-01-29 NOTE — Plan of Care (Signed)
  Problem: Elimination: Goal: Will not experience complications related to bowel motility Outcome: Progressing  Pt had large BM

## 2020-01-29 NOTE — Consult Note (Signed)
Urology Consult   Physician requesting consult: Dr Christ Kick  Reason for consult: Concern for urinary retention, difficult foley catheter placement  History of Present Illness: Kathryn Whitaker is a 86 y.o. female with history of SCZ, COPD, CVA who presents with acute hypoglycemia, for whom urology is consulted regarding assistance with placment of foley catheter. Patient has not voided since arrival, and UA is needed as part of clinical workup. Patient is non-verbal, so history is based on chart review. CT on arrival revealed significant stool burden and stool impaction in the sigmoid colon. Bladder is moderately distended.    Past Medical History:  Diagnosis Date   Acute asthmatic bronchitis    Allergic rhinitis    Alzheimer's dementia (HCC)    Anxiety    Chronic constipation    Diverticulosis of colon    DJD (degenerative joint disease)    GERD (gastroesophageal reflux disease)    History of colonic polyps    Hypercholesteremia    Hypertension    IBS (irritable bowel syndrome)    Lacunar infarction (HCC)    Lumbar back pain    Malnutrition ([place])    UTI (lower urinary tract infection)    Vitamin D deficiency     Past Surgical History:  Procedure Laterality Date   ABDOMINAL HYSTERECTOMY     FEMUR IM NAIL Left 01/13/2017   Procedure: TROCHANTERIC INTRAMEDULLARY (IM) NAIL FEMORAL;  Surgeon: Renette Butters, MD;  Location: [facility];  Service: Orthopedics;  Laterality: Left;     Current Hospital Medications:  Home meds:  No current facility-administered medications on file prior to encounter.   Current Outpatient Medications on File Prior to Encounter  Medication Sig Dispense Refill   acetaminophen (TYLENOL) 500 MG tablet Take 500 mg by mouth every 4 (four) hours as needed for mild pain, fever or headache.     albuterol (PROVENTIL HFA) 108 (90 BASE) MCG/ACT inhaler Inhale 2 puffs into the lungs every 6 (six) hours as needed. For shortness of breath 1  Inhaler 0   alum & mag hydroxide-simeth (MINTOX REGULAR STRENGTH) 200-200-20 MG/5ML suspension Take 30 mLs by mouth every 6 (six) hours as needed for indigestion or heartburn.     aspirin 81 MG chewable tablet Chew 81 mg by mouth daily.     Chloroxylenol-Zinc Oxide (BAZA EX) Apply 1 application topically See admin instructions. Apply to buttocks bid and after each incontinence episode     gabapentin (NEURONTIN) 100 MG capsule Take 100 mg by mouth 2 (two) times daily.     guaifenesin (ROBITUSSIN) 100 MG/5ML syrup Take 200 mg by mouth every 6 (six) hours as needed for cough.     haloperidol (HALDOL) 2 MG/ML solution Take 0.25 mg by mouth 3 (three) times daily.      lamoTRIgine (LAMICTAL) 25 MG tablet Take 3 tablets (75 mg total) by mouth 2 (two) times daily. (Patient taking differently: Take 50 mg by mouth 2 (two) times daily. ) 30 tablet 0   levothyroxine (SYNTHROID, LEVOTHROID) 50 MCG tablet TAKE 1 TABLET (50 MCG TOTAL) BY MOUTH DAILY BEFORE BREAKFAST. 30 tablet 0   loperamide (IMODIUM) 2 MG capsule Take 2 mg by mouth as needed for diarrhea or loose stools.     magnesium hydroxide (MILK OF MAGNESIA) 400 MG/5ML suspension Take 30 mLs by mouth at bedtime as needed for mild constipation.     neomycin-bacitracin-polymyxin (NEOSPORIN) [phone] ointment Apply 1 application topically daily as needed (skin abrasions).     PRESCRIPTION MEDICATION Take 1 each  by mouth in the morning, at noon, and at bedtime. Mighty shake     senna (SENOKOT) 8.6 MG TABS tablet Take 2 tablets by mouth daily.     traMADol (ULTRAM) 50 MG tablet Take 50 mg by mouth 2 (two) times daily.     trolamine salicylate (ASPERCREME) 10 % cream Apply 1 application topically in the morning and at bedtime. Right knee     feeding supplement (BOOST / RESOURCE BREEZE) LIQD Take 1 Container by mouth 3 (three) times daily between meals. (Patient not taking: Reported on 07/08/2018) 90 Container 0     Scheduled Meds: Continuous  Infusions:  sodium chloride     PRN Meds:.  Allergies:  Allergies  Allergen Reactions   Demerol [Meperidine]     Pt states she can't remember type of reaction   Influenza Vaccines     Pt can't remember reaction but states allergic and has not had in 25 years   Amoxicillin     PER MAR   Morphine And Related Dermatitis   Peanuts [Peanut Oil]     Pt reports she is allergic to peanuts, but was asking to eat peanut butter. Pt unsure of her reaction.    Prevpac [Amoxicill-Clarithro-Lansopraz] Hives    .Marland KitchenHas patient had a PCN reaction causing immediate rash, facial/tongue/throat swelling, SOB or lightheadedness with hypotension: unknown Has patient had a PCN reaction causing severe rash involving mucus membranes or skin necrosis: unknown Has patient had a PCN reaction that required hospitalization unknown Has patient had a PCN reaction occurring within the last 10 years: unknown If all of the above answers are "NO", then may proceed with Cephalosporin use.    Telithromycin Hives    REACTION: pt states HIVES....    Family History  Problem Relation Age of Onset   Hypertension Other     Social History:  reports that she has never smoked. She has never used smokeless tobacco. She reports that she does not drink alcohol and does not use drugs.  ROS: A complete review of systems was performed.  All systems are negative except for pertinent findings as noted.  Physical Exam:  Vital signs in last 24 hours: Temp:  [97.7 F (36.5 C)-97.8 F (36.6 C)] 97.8 F (36.6 C) (08/07 1228) Pulse Rate:  [56-64] 56 (08/07 1200) Resp:  [11-17] 15 (08/07 1200) BP: (100-138)/(38-67) 106/38 (08/07 1200) SpO2:  [97 %-100 %] 99 % (08/07 1200) Weight:  [65 kg] 65 kg (08/07 0653) Constitutional:  Non-verbal, non-communicative, lying in bed  Cardiovascular: Regular rate and rhythm, No JVD Respiratory: Normal respiratory effort GI: Abdomen is soft, nontender GU: Notable vaginal atrophy,  scarred, narrow introitus. Mild labial edema Lymphatic: No lymphadenopathy Neurologic: unable to assess Exremities: Significant lower extremity contractures  Laboratory Data:  Recent Labs    01/29/20 0817  WBC 5.0  HGB 11.8*  HCT 38.1  PLT 150    Recent Labs    01/29/20 0817  NA 142  K 4.2  CL 105  GLUCOSE 67*  BUN 18  CALCIUM 9.2  CREATININE 0.88     Results for orders placed or performed during the hospital encounter of 01/29/20 (from the past 24 hour(s))  Basic metabolic panel     Status: Abnormal   Collection Time: 01/29/20  8:17 AM  Result Value Ref Range   Sodium 142 135 - 145 mmol/L   Potassium 4.2 3.5 - 5.1 mmol/L   Chloride 105 98 - 111 mmol/L   CO2 29 22 - 32 mmol/L  Glucose, Bld 67 (L) 70 - 99 mg/dL   BUN 18 8 - 23 mg/dL   Creatinine, Ser 0.88 0.44 - 1.00 mg/dL   Calcium 9.2 8.9 - 10.3 mg/dL   GFR calc non Af Amer 55 (L) >60 mL/min   GFR calc Af Amer >60 >60 mL/min   Anion gap 8 5 - 15  CBC with Differential     Status: Abnormal   Collection Time: 01/29/20  8:17 AM  Result Value Ref Range   WBC 5.0 4.0 - 10.5 K/uL   RBC 3.93 3.87 - 5.11 MIL/uL   Hemoglobin 11.8 (L) 12.0 - 15.0 g/dL   HCT 38.1 36 - 46 %   MCV 96.9 80.0 - 100.0 fL   MCH 30.0 26.0 - 34.0 pg   MCHC 31.0 30.0 - 36.0 g/dL   RDW 13.5 11.5 - 15.5 %   Platelets 150 150 - 400 K/uL   nRBC 0.0 0.0 - 0.2 %   Neutrophils Relative % 40 %   Neutro Abs 2.0 1.7 - 7.7 K/uL   Lymphocytes Relative 38 %   Lymphs Abs 1.9 0.7 - 4.0 K/uL   Monocytes Relative 14 %   Monocytes Absolute 0.7 0 - 1 K/uL   Eosinophils Relative 6 %   Eosinophils Absolute 0.3 0 - 0 K/uL   Basophils Relative 1 %   Basophils Absolute 0.1 0 - 0 K/uL   Immature Granulocytes 1 %   Abs Immature Granulocytes 0.04 0.00 - 0.07 K/uL  POC CBG, ED     Status: Abnormal   Collection Time: 01/29/20 10:43 AM  Result Value Ref Range   Glucose-Capillary 52 (L) 70 - 99 mg/dL  POC CBG, ED     Status: Abnormal   Collection Time:  01/29/20 12:03 PM  Result Value Ref Range   Glucose-Capillary 60 (L) 70 - 99 mg/dL   No results found for this or any previous visit (from the past 240 hour(s)).  Renal Function: Recent Labs    01/29/20 0817  CREATININE 0.88   Estimated Creatinine Clearance: 31.6 mL/min (by C-G formula based on SCr of 0.88 mg/dL).  Radiologic Imaging: CT Abdomen Pelvis Wo Contrast  Result Date: 01/29/2020 CLINICAL DATA:  Abdominal pain, acute and non localized. EXAM: CT ABDOMEN AND PELVIS WITHOUT CONTRAST TECHNIQUE: Multidetector CT imaging of the abdomen and pelvis was performed following the standard protocol without IV contrast. COMPARISON:  06/03/2015 FINDINGS: Lower chest: Trace bilateral pleural effusions. Dependent atelectasis identified at the lung bases. The heart is mildly enlarged. There is coronary artery calcification. Atherosclerotic calcification of the root of the aorta. No pericardial effusion. Hepatobiliary: Stable cyst within the dome of the LEFT hepatic lobe is 2.0 centimeters. No suspicious hepatic lesion. Status post cholecystectomy. Pancreas: Unremarkable. No pancreatic ductal dilatation or surrounding inflammatory changes. Spleen: Normal in size without focal abnormality. Adrenals/Urinary Tract: Adrenal glands are normal. Multiple bilateral renal cysts appear stable. Largest is on the RIGHT, measuring 5.0 centimeters. No hydronephrosis. Ureters are unremarkable. Focal calcifications in the renal hilar regions are favored to represent atherosclerotic calcification of the renal arteries. Small intrarenal calculi cannot be excluded. Stomach/Bowel: There is fecal impaction of the sigmoid colon. Sigmoid is distended to 8.4 centimeters by a large amount of stool. Large stool burden throughout the colon. There are numerous diverticula but no evidence for acute diverticulitis. The appendix is well seen and has a normal appearance. Stomach and small bowel loops are unremarkable. Vascular/Lymphatic:  There is significant atherosclerotic calcification of the abdominal aorta  not associated with aneurysm. No retroperitoneal or mesenteric adenopathy. Reproductive: Hysterectomy.  No adnexal mass. Other: No ascites.  Anterior abdominal wall is unremarkable. Musculoskeletal: Bones appear lucent. Scoliosis and degenerative changes in the spine. No acute fracture. Remote LEFT hip ORIF. IMPRESSION: 1. Fecal impaction of the sigmoid colon. Sigmoid is distended to 8.4 centimeters by stool. 2. Large stool burden throughout the colon. 3. Colonic diverticulosis without evidence for acute diverticulitis. 4. Normal appendix. 5. Trace bilateral pleural effusions and bibasilar atelectasis. 6. Cardiomegaly and coronary artery disease. 7. Stable appearance of hepatic and renal cysts. 8. Bones appear lucent.  Suspect osteopenia/osteoporosis. 9. Aortic Atherosclerosis (ICD10-I70.0). Electronically Signed   By: Nolon Nations M.D.   On: 01/29/2020 10:03    I independently reviewed the above imaging studies.  Procedure:  Patient was prepped and draped in the usual sterile fashion. With assistance of 3 nurses, patient's legs were held up in dorsal lithotomy position to improve visualization. Notable labial edema and vaginal atrophy with narrow introitus. Able to visualize urethral meatus with retraction, and a 14Fr coude catheter was placed without difficulty per urethra, with return of clear yellow urine. 10cc of sterile water was instilled in the balloon.   Impression/Recommendation: Urinary retention, difficult foley catheter placement in the setting of contractures, vaginal atrophy.  - Continue foley catheter per primary team as needed during hospitalization. Recommend treatment of stool impaction prior to catheter removal, as this will likely contribute to persistent urinary retention  Carmie Kanner 01/29/2020, 1:29 PM

## 2020-01-29 NOTE — ED Triage Notes (Signed)
Patient brought in by EMS, sent from [facility] for [place]. Patient has a history of schizophrenia, bipolar, COPD, anemia, CVA, hypothyroidism. Patient is not verbal for EMS or staff and takes daily dose of Haldol. Patient has contractures and is at baseline per EMS.

## 2020-01-30 ENCOUNTER — Encounter (HOSPITAL_COMMUNITY): Payer: Self-pay | Admitting: Internal Medicine

## 2020-01-30 ENCOUNTER — Other Ambulatory Visit: Payer: Self-pay

## 2020-01-30 DIAGNOSIS — R338 Other retention of urine: Secondary | ICD-10-CM

## 2020-01-30 DIAGNOSIS — E039 Hypothyroidism, unspecified: Secondary | ICD-10-CM

## 2020-01-30 DIAGNOSIS — E43 Unspecified severe protein-calorie malnutrition: Secondary | ICD-10-CM

## 2020-01-30 DIAGNOSIS — E860 Dehydration: Secondary | ICD-10-CM

## 2020-01-30 DIAGNOSIS — K5641 Fecal impaction: Secondary | ICD-10-CM

## 2020-01-30 LAB — COMPREHENSIVE METABOLIC PANEL
ALT: 13 U/L (ref 0–44)
AST: 17 U/L (ref 15–41)
Albumin: 2.9 g/dL — ABNORMAL LOW (ref 3.5–5.0)
Alkaline Phosphatase: 64 U/L (ref 38–126)
Anion gap: 8 (ref 5–15)
BUN: 14 mg/dL (ref 8–23)
CO2: 24 mmol/L (ref 22–32)
Calcium: 8.7 mg/dL — ABNORMAL LOW (ref 8.9–10.3)
Chloride: 110 mmol/L (ref 98–111)
Creatinine, Ser: 0.63 mg/dL (ref 0.44–1.00)
GFR calc Af Amer: >60 mL/min (ref >60)
GFR calc non Af Amer: >60 mL/min (ref >60)
Glucose, Bld: 90 mg/dL (ref 70–99)
Potassium: 3.8 mmol/L (ref 3.5–5.1)
Sodium: 142 mmol/L (ref 135–145)
Total Bilirubin: 0.5 mg/dL (ref 0.3–1.2)
Total Protein: 6.1 g/dL — ABNORMAL LOW (ref 6.5–8.1)

## 2020-01-30 LAB — T4, FREE: Free T4: 1.12 ng/dL (ref 0.61–1.12)

## 2020-01-30 LAB — GLUCOSE, CAPILLARY
Glucose-Capillary: 75 mg/dL (ref 70–99)
Glucose-Capillary: 77 mg/dL (ref 70–99)
Glucose-Capillary: 79 mg/dL (ref 70–99)
Glucose-Capillary: 84 mg/dL (ref 70–99)
Glucose-Capillary: 90 mg/dL (ref 70–99)
Glucose-Capillary: 95 mg/dL (ref 70–99)
Glucose-Capillary: 95 mg/dL (ref 70–99)

## 2020-01-30 LAB — CBC
HCT: 37.3 % (ref 36.0–46.0)
Hemoglobin: 11.7 g/dL — ABNORMAL LOW (ref 12.0–15.0)
MCH: 29.9 pg (ref 26.0–34.0)
MCHC: 31.4 g/dL (ref 30.0–36.0)
MCV: 95.4 fL (ref 80.0–100.0)
Platelets: 226 10*3/uL (ref 150–400)
RBC: 3.91 MIL/uL (ref 3.87–5.11)
RDW: 13.3 % (ref 11.5–15.5)
WBC: 4.9 10*3/uL (ref 4.0–10.5)
nRBC: 0 % (ref 0.0–0.2)

## 2020-01-30 LAB — TSH: TSH: 2.029 u[IU]/mL (ref 0.350–4.500)

## 2020-01-30 MED ORDER — SORBITOL 70 % SOLN
960.0000 mL | TOPICAL_OIL | Freq: Once | ORAL | Status: AC
  Administered 2020-01-30: 960 mL via RECTAL
  Filled 2020-01-30: qty 473

## 2020-01-30 MED ORDER — HALOPERIDOL LACTATE 2 MG/ML PO CONC
0.2500 mg | Freq: Three times a day (TID) | ORAL | Status: DC
  Administered 2020-01-30 – 2020-01-31 (×3): 0.26 mg via ORAL
  Filled 2020-01-30 (×4): qty 0.2

## 2020-01-30 MED ORDER — BOOST / RESOURCE BREEZE PO LIQD CUSTOM
1.0000 | Freq: Three times a day (TID) | ORAL | Status: DC
  Administered 2020-01-30 – 2020-01-31 (×4): 1 via ORAL

## 2020-01-30 MED ORDER — ASPIRIN 81 MG PO CHEW
81.0000 mg | CHEWABLE_TABLET | Freq: Every day | ORAL | Status: DC
  Administered 2020-01-30 – 2020-01-31 (×2): 81 mg via ORAL
  Filled 2020-01-30 (×2): qty 1

## 2020-01-30 MED ORDER — LEVOTHYROXINE SODIUM 50 MCG PO TABS
50.0000 ug | ORAL_TABLET | Freq: Every day | ORAL | Status: DC
  Administered 2020-01-31: 50 ug via ORAL
  Filled 2020-01-30: qty 1

## 2020-01-30 MED ORDER — TRAMADOL HCL 50 MG PO TABS
50.0000 mg | ORAL_TABLET | Freq: Two times a day (BID) | ORAL | Status: DC
  Administered 2020-01-30 – 2020-01-31 (×3): 50 mg via ORAL
  Filled 2020-01-30 (×3): qty 1

## 2020-01-30 MED ORDER — GABAPENTIN 100 MG PO CAPS
100.0000 mg | ORAL_CAPSULE | Freq: Two times a day (BID) | ORAL | Status: DC
  Administered 2020-01-30 – 2020-01-31 (×3): 100 mg via ORAL
  Filled 2020-01-30 (×3): qty 1

## 2020-01-30 MED ORDER — KETOROLAC TROMETHAMINE 30 MG/ML IJ SOLN
15.0000 mg | Freq: Once | INTRAMUSCULAR | Status: AC
  Administered 2020-01-30: 15 mg via INTRAVENOUS
  Filled 2020-01-30: qty 1

## 2020-01-30 MED ORDER — ACETAMINOPHEN 500 MG PO TABS
500.0000 mg | ORAL_TABLET | ORAL | Status: DC | PRN
  Administered 2020-01-30 – 2020-01-31 (×3): 500 mg via ORAL
  Filled 2020-01-30 (×3): qty 1

## 2020-01-30 MED ORDER — LAMOTRIGINE 25 MG PO TABS
50.0000 mg | ORAL_TABLET | Freq: Two times a day (BID) | ORAL | Status: DC
  Administered 2020-01-30 – 2020-01-31 (×2): 50 mg via ORAL
  Filled 2020-01-30 (×3): qty 2

## 2020-01-30 NOTE — Progress Notes (Signed)
PROGRESS NOTE    Kathryn Whitaker   IPJ:[id number]  DOB: 1920-03-03  DOA: 01/29/2020 PCP: Hoyt Koch, MD   Brief Narrative:  Kathryn Whitaker is a 86 y.o. female who lives in a nursing facility and who has dementia with psychosis, hypothyroidism, history of irritable bowel syndrome, history of UTIs and is on hospice was sent here to the emergency department as patient was poorly responsive.    Daughter is at the bedside and provided most of the information.  Patient is arousable but does not really answer many questions.  According to the daughter patient has been progressively getting worse for the past 2 to 3 months.  She has lost a lot of weight.  Her appetite has been poor.  There has been no reports of any nausea or vomiting.  No diarrhea.  She has had some constipation.  Patient has been denying any pain.  She also developed contractures of her upper extremity.  She has not been really able to walk for the last year or so.  Gets around in a wheelchair.  In ED > lethargic, found to have a UTI, urinary retention, low glucose levels and fecal impaction Started on Ceftriaxone   Subjective: Patient is confused. Per daughter, she is much better than yesterday.     Assessment & Plan:   Principal Problem:   Acute metabolic encephalopathy - due to UTI and dehydration- now alert and at baseline - cont Ceftriaxone  Active Problems:' Dehydration, hypoglycemia - given NS bolus and then D51/2 NS - start a diet and she if she will eat and drink today but continue IVF until we are sure she is taking enough liquids to prevent recurrent dehydration and eating enough food to prevent recurrent hypolycemia     UTI (urinary tract infection) with urinary retention - cont Ceftriaxone- - UA> 100 K proteus - foley placed by urology    Fecal impaction  - has only had 1 BM thus far- give a smog enema today - once impaction resolved, can give voiding trial     Protein-calorie  malnutrition, severe - follow oral intake over the next 24 hrs to see if it is adequate    Hypothyroidism - cont Synthroid    Dementia with psychosis ([place]) - cont all home meds- may need a sitter but currently her daughter is present  Time spent in minutes: 35 DVT prophylaxis: enoxaparin (LOVENOX) injection 40 mg Start: 01/29/20 1800  Code Status: DNR Family Communication: daughter Disposition Plan:  Status is: Inpatient  Remains inpatient appropriate because:treating multiple issues and on IVF   Dispo: The patient is from: SNF              Anticipated d/c is to: TBD              Anticipated d/c date is: 2 days              Patient currently is not medically stable to d/c.      Consultants:   Urology Procedures:   Foley cath Antimicrobials:  Anti-infectives (From admission, onward)   Start     Dose/Rate Route Frequency Ordered Stop   01/29/20 1500  cefTRIAXone (ROCEPHIN) 1 g in sodium chloride 0.9 % 100 mL IVPB     Discontinue     1 g 200 mL/hr over 30 Minutes Intravenous Every 24 hours 01/29/20 1421         Objective: Vitals:   01/29/20 2143 01/30/20 0207 01/30/20 0359 01/30/20 1427  BP: (!) 147/72 135/68 (!) 141/63 (!) 141/62  Pulse: 76 75 71 65  Resp: 18 15 17 20   Temp: 97.6 F (36.4 C) 98.2 F (36.8 C) 97.7 F (36.5 C) 97.9 F (36.6 C)  TempSrc: Axillary Oral Oral Oral  SpO2: 100% 97% 96% 100%  Weight:      Height:        Intake/Output Summary (Last 24 hours) at 01/30/2020 1534 Last data filed at 01/30/2020 1300 Gross per 24 hour  Intake 1017.5 ml  Output 550 ml  Net 467.5 ml   Filed Weights   01/29/20 0653  Weight: 65 kg    Examination: General exam: Appears comfortable  HEENT: PERRLA, oral mucosa moist, no sclera icterus or thrush Respiratory system: Clear to auscultation. Respiratory effort normal. Cardiovascular system: S1 & S2 heard, RRR.   Gastrointestinal system: Abdomen soft, non-tender, nondistended. Normal bowel sounds. Central  nervous system: Alert - not oriented except to person. No focal neurological deficits. Extremities: No cyanosis, clubbing or edema Skin: No rashes or ulcers Psychiatry:  Mood & affect appropriate.     Data Reviewed: I have personally reviewed following labs and imaging studies  CBC: Recent Labs  Lab 01/29/20 0817 01/30/20 0241  WBC 5.0 4.9  NEUTROABS 2.0  --   HGB 11.8* 11.7*  HCT 38.1 37.3  MCV 96.9 95.4  PLT 150 193   Basic Metabolic Panel: Recent Labs  Lab 01/29/20 0817 01/30/20 0241  NA 142 142  K 4.2 3.8  CL 105 110  CO2 29 24  GLUCOSE 67* 90  BUN 18 14  CREATININE 0.88 0.63  CALCIUM 9.2 8.7*   GFR: Estimated Creatinine Clearance: 34.8 mL/min (by C-G formula based on SCr of 0.63 mg/dL). Liver Function Tests: Recent Labs  Lab 01/30/20 0241  AST 17  ALT 13  ALKPHOS 64  BILITOT 0.5  PROT 6.1*  ALBUMIN 2.9*   No results for input(s): LIPASE, AMYLASE in the last 168 hours. No results for input(s): AMMONIA in the last 168 hours. Coagulation Profile: No results for input(s): INR, PROTIME in the last 168 hours. Cardiac Enzymes: No results for input(s): CKTOTAL, CKMB, CKMBINDEX, TROPONINI in the last 168 hours. BNP (last 3 results) No results for input(s): PROBNP in the last 8760 hours. HbA1C: No results for input(s): HGBA1C in the last 72 hours. CBG: Recent Labs  Lab 01/30/20 0400 01/30/20 0605 01/30/20 0717 01/30/20 0931 01/30/20 1143  GLUCAP 79 95 84 77 95   Lipid Profile: No results for input(s): CHOL, HDL, LDLCALC, TRIG, CHOLHDL, LDLDIRECT in the last 72 hours. Thyroid Function Tests: Recent Labs    01/30/20 0241  TSH 2.029  FREET4 1.12   Anemia Panel: No results for input(s): VITAMINB12, FOLATE, FERRITIN, TIBC, IRON, RETICCTPCT in the last 72 hours. Urine analysis:    Component Value Date/Time   COLORURINE YELLOW 01/29/2020 0817   APPEARANCEUR CLOUDY (A) 01/29/2020 0817   LABSPEC 1.016 01/29/2020 0817   PHURINE 8.0 01/29/2020 0817    GLUCOSEU 50 (A) 01/29/2020 0817   GLUCOSEU NEGATIVE 06/03/2013 1035   HGBUR SMALL (A) 01/29/2020 0817   BILIRUBINUR NEGATIVE 01/29/2020 0817   BILIRUBINUR 1+ 07/26/2016 1027   KETONESUR NEGATIVE 01/29/2020 0817   PROTEINUR 100 (A) 01/29/2020 0817   UROBILINOGEN 1.0 07/26/2016 1027   UROBILINOGEN 0.2 03/13/2015 1255   NITRITE POSITIVE (A) 01/29/2020 0817   LEUKOCYTESUR LARGE (A) 01/29/2020 0817   Sepsis Labs: @LABRCNTIP (procalcitonin:4,lacticidven:4) ) Recent Results (from the past 240 hour(s))  SARS Coronavirus 2 by RT  PCR (hospital order, performed in [facility] hospital lab) Nasopharyngeal Nasopharyngeal Swab     Status: None   Collection Time: 01/29/20 12:20 PM   Specimen: Nasopharyngeal Swab  Result Value Ref Range Status   SARS Coronavirus 2 NEGATIVE NEGATIVE Final    Comment: (NOTE) SARS-CoV-2 target nucleic acids are NOT DETECTED.  The SARS-CoV-2 RNA is generally detectable in upper and lower respiratory specimens during the acute phase of infection. The lowest concentration of SARS-CoV-2 viral copies this assay can detect is 250 copies / mL. A negative result does not preclude SARS-CoV-2 infection and should not be used as the sole basis for treatment or other patient management decisions.  A negative result may occur with improper specimen collection / handling, submission of specimen other than nasopharyngeal swab, presence of viral mutation(s) within the areas targeted by this assay, and inadequate number of viral copies (<250 copies / mL). A negative result must be combined with clinical observations, patient history, and epidemiological information.  Fact Sheet for Patients:   StrictlyIdeas.no  Fact Sheet for Healthcare Providers: BankingDealers.co.za  This test is not yet approved or  cleared by the [place] FDA and has been authorized for detection and/or diagnosis of SARS-CoV-2 by FDA under an Emergency  Use Authorization (EUA).  This EUA will remain in effect (meaning this test can be used) for the duration of the COVID-19 declaration under Section 564(b)(1) of the Act, 21 U.S.C. section 360bbb-3(b)(1), unless the authorization is terminated or revoked sooner.  Performed at [facility], [place] [address], [place], [place] [address]   Culture, Urine     Status: Abnormal (Preliminary result)   Collection Time: 01/29/20  2:12 PM   Specimen: Urine, Random  Result Value Ref Range Status   Specimen Description   Final    URINE, RANDOM Performed at [facility] [address] [place], [place] [address]    Special Requests   Final    NONE Performed at [facility], [place] [address], [place], [place] [address]    Culture (A)  Final    >=100,000 COLONIES/mL PROTEUS MIRABILIS SUSCEPTIBILITIES TO FOLLOW Performed at [facility], [place] [address], [place], [place] [address]    Report Status PENDING  Incomplete         Radiology Studies: CT Abdomen Pelvis Wo Contrast  Result Date: 01/29/2020 CLINICAL DATA:  Abdominal pain, acute and non localized. EXAM: CT ABDOMEN AND PELVIS WITHOUT CONTRAST TECHNIQUE: Multidetector CT imaging of the abdomen and pelvis was performed following the standard protocol without IV contrast. COMPARISON:  06/03/2015 FINDINGS: Lower chest: Trace bilateral pleural effusions. Dependent atelectasis identified at the lung bases. The heart is mildly enlarged. There is coronary artery calcification. Atherosclerotic calcification of the root of the aorta. No pericardial effusion. Hepatobiliary: Stable cyst within the dome of the LEFT hepatic lobe is 2.0 centimeters. No suspicious hepatic lesion. Status post cholecystectomy. Pancreas: Unremarkable. No pancreatic ductal dilatation or surrounding inflammatory changes. Spleen: Normal in size without focal abnormality. Adrenals/Urinary Tract: Adrenal glands are normal.  Multiple bilateral renal cysts appear stable. Largest is on the RIGHT, measuring 5.0 centimeters. No hydronephrosis. Ureters are unremarkable. Focal calcifications in the renal hilar regions are favored to represent atherosclerotic calcification of the renal arteries. Small intrarenal calculi cannot be excluded. Stomach/Bowel: There is fecal impaction of the sigmoid colon. Sigmoid is distended to 8.4 centimeters by a large amount of stool. Large stool burden throughout the colon. There are numerous diverticula but no  evidence for acute diverticulitis. The appendix is well seen and has a normal appearance. Stomach and small bowel loops are unremarkable. Vascular/Lymphatic: There is significant atherosclerotic calcification of the abdominal aorta not associated with aneurysm. No retroperitoneal or mesenteric adenopathy. Reproductive: Hysterectomy.  No adnexal mass. Other: No ascites.  Anterior abdominal wall is unremarkable. Musculoskeletal: Bones appear lucent. Scoliosis and degenerative changes in the spine. No acute fracture. Remote LEFT hip ORIF. IMPRESSION: 1. Fecal impaction of the sigmoid colon. Sigmoid is distended to 8.4 centimeters by stool. 2. Large stool burden throughout the colon. 3. Colonic diverticulosis without evidence for acute diverticulitis. 4. Normal appendix. 5. Trace bilateral pleural effusions and bibasilar atelectasis. 6. Cardiomegaly and coronary artery disease. 7. Stable appearance of hepatic and renal cysts. 8. Bones appear lucent.  Suspect osteopenia/osteoporosis. 9. Aortic Atherosclerosis (ICD10-I70.0). Electronically Signed   By: Nolon Nations M.D.   On: 01/29/2020 10:03      Scheduled Meds: . aspirin  81 mg Oral Daily  . enoxaparin (LOVENOX) injection  40 mg Subcutaneous Q24H  . gabapentin  100 mg Oral BID  . haloperidol  0.26 mg Oral TID  . [START ON 01/31/2020] levothyroxine  50 mcg Oral Q0600  . polyethylene glycol  17 g Oral BID  . sorbitol, milk of mag, mineral oil,  glycerin (SMOG) enema  960 mL Rectal Once  . traMADol  50 mg Oral BID   Continuous Infusions: . cefTRIAXone (ROCEPHIN)  IV 1 g (01/30/20 1428)     LOS: 1 day      Debbe Odea, MD [facility] Pager: [email or web address] 01/30/2020, 3:34 PM

## 2020-01-30 NOTE — Progress Notes (Signed)
[facility]) hospitalized hospice patient visit.  Patient was sent from [facility] for decreased responsiveness, decreased oral intake, and recent combativeness. [place] notified [facility] on call. Family was aware of transfer. Patient was admitted to [facility] hospital on 8/72021 with a diagnosis of UTI and fecal impaction. Per Dr. Alferd Patee this is a related hospital admission.   Visited patient at bedside. She was sleeping and did not arouse to my voice. She appears comfortable. No Family at bedside. Spoke to daughter, [name] by phone who stated she was there prior to my visit. [name] states her patient is more responsive today and some of her lunch.   VS: 97.9, 141/62, 65, 20, 100% RA I/O: 1387/550   Abn Labs: calcium 8.7, Albumin 2.9, total protein 6.1, Hgb 11.7 Diagnostics:  CT abdomen/Pelvis IMPRESSION: 1. Fecal impaction of the sigmoid colon. Sigmoid is distended to 8.4 centimeters by stool. 2. Large stool burden throughout the colon. 3. Colonic diverticulosis without evidence for acute diverticulitis. 4. Normal appendix. 5. Trace bilateral pleural effusions and bibasilar atelectasis. 6. Cardiomegaly and coronary artery disease. 7. Stable appearance of hepatic and renal cysts. 8. Bones appear lucent.  Suspect osteopenia/osteoporosis. 9. Aortic Atherosclerosis (ICD10-I70.0).  IV/ PRN medications:  Rocephin 1gm IVPB QD, no PRNs  Problem list: Principal Problem:   Acute metabolic encephalopathy - due to UTI and dehydration- now alert and at baseline - cont Ceftriaxone  Active Problems:' Dehydration, hypoglycemia - given NS bolus and then D51/2 NS - start a diet and she if she will eat and drink today but continue IVF until we are sure she is taking enough liquids to prevent recurrent dehydration and eating enough food to prevent recurrent hypolycemia     UTI (urinary tract infection) with urinary retention - cont Ceftriaxone- - UA> 100 K  proteus - foley placed by urology    Fecal impaction  - has only had 1 BM thus far- give a smog enema today - once impaction resolved, can give voiding trial     Protein-calorie malnutrition, severe - follow oral intake over the next 24 hrs to see if it is adequate    Hypothyroidism - cont Synthroid    Dementia with psychosis [facility])  Discharge planning: Return to [facility] when stable for discharge.  Family contact: Spoke with daughter as noted above. Daughter has concerns that hospice medication list did not match list provided to the hospital from [facility]. Medications need to be reconciled when she returns to SNF.  IDG: Updated  Goals of Care: Clear. DNR. Treat conditions and when stable return to [facility]   Medication list and transfer summary placed on shadow chart.   Please use [facility] for ambulance transport as they contract this service or our active hospice patients.   [facility] Liaison ( listed on [place]

## 2020-01-30 NOTE — Progress Notes (Signed)
Urology Consult   Physician requesting consult: Dr Christ Kick  Reason for consult: Concern for urinary retention, difficult foley catheter placement  Interval/Subjective: Patient more alert this morning, had a very large BM yesterday evening.   Physical Exam:  Vital signs in last 24 hours: Temp:  [97.6 F (36.4 C)-98.2 F (36.8 C)] 97.7 F (36.5 C) (08/08 0359) Pulse Rate:  [52-76] 71 (08/08 0359) Resp:  [9-20] 17 (08/08 0359) BP: (100-153)/(38-72) 141/63 (08/08 0359) SpO2:  [96 %-100 %] 96 % (08/08 0359) Constitutional:  More alert, responsive this morning Respiratory: Normal respiratory effort GI: Abdomen is soft, nontender GU: Foley draining clear yellow urine Exremities: Significant lower extremity contractures  Laboratory Data:  Recent Labs    01/29/20 0817 01/30/20 0241  WBC 5.0 4.9  HGB 11.8* 11.7*  HCT 38.1 37.3  PLT 150 226    Recent Labs    01/29/20 0817 01/30/20 0241  NA 142 142  K 4.2 3.8  CL 105 110  GLUCOSE 67* 90  BUN 18 14  CALCIUM 9.2 8.7*  CREATININE 0.88 0.63     Results for orders placed or performed during the hospital encounter of 01/29/20 (from the past 24 hour(s))  POC CBG, ED     Status: Abnormal   Collection Time: 01/29/20 12:03 PM  Result Value Ref Range   Glucose-Capillary 60 (L) 70 - 99 mg/dL  SARS Coronavirus 2 by RT PCR (hospital order, performed in [place] hospital lab) Nasopharyngeal Nasopharyngeal Swab     Status: None   Collection Time: 01/29/20 12:20 PM   Specimen: Nasopharyngeal Swab  Result Value Ref Range   SARS Coronavirus 2 NEGATIVE NEGATIVE  Glucose, capillary     Status: Abnormal   Collection Time: 01/29/20  6:16 PM  Result Value Ref Range   Glucose-Capillary 67 (L) 70 - 99 mg/dL  Glucose, capillary     Status: Abnormal   Collection Time: 01/29/20  7:26 PM  Result Value Ref Range   Glucose-Capillary 137 (H) 70 - 99 mg/dL  Glucose, capillary     Status: Abnormal   Collection Time: 01/29/20  9:46 PM   Result Value Ref Range   Glucose-Capillary 123 (H) 70 - 99 mg/dL  Glucose, capillary     Status: None   Collection Time: 01/30/20 12:08 AM  Result Value Ref Range   Glucose-Capillary 75 70 - 99 mg/dL  Glucose, capillary     Status: None   Collection Time: 01/30/20  2:09 AM  Result Value Ref Range   Glucose-Capillary 90 70 - 99 mg/dL  Comprehensive metabolic panel     Status: Abnormal   Collection Time: 01/30/20  2:41 AM  Result Value Ref Range   Sodium 142 135 - 145 mmol/L   Potassium 3.8 3.5 - 5.1 mmol/L   Chloride 110 98 - 111 mmol/L   CO2 24 22 - 32 mmol/L   Glucose, Bld 90 70 - 99 mg/dL   BUN 14 8 - 23 mg/dL   Creatinine, Ser 0.63 0.44 - 1.00 mg/dL   Calcium 8.7 (L) 8.9 - 10.3 mg/dL   Total Protein 6.1 (L) 6.5 - 8.1 g/dL   Albumin 2.9 (L) 3.5 - 5.0 g/dL   AST 17 15 - 41 U/L   ALT 13 0 - 44 U/L   Alkaline Phosphatase 64 38 - 126 U/L   Total Bilirubin 0.5 0.3 - 1.2 mg/dL   GFR calc non Af Amer >60 >60 mL/min   GFR calc Af Amer >60 >60 mL/min  Anion gap 8 5 - 15  CBC     Status: Abnormal   Collection Time: 01/30/20  2:41 AM  Result Value Ref Range   WBC 4.9 4.0 - 10.5 K/uL   RBC 3.91 3.87 - 5.11 MIL/uL   Hemoglobin 11.7 (L) 12.0 - 15.0 g/dL   HCT 37.3 36 - 46 %   MCV 95.4 80.0 - 100.0 fL   MCH 29.9 26.0 - 34.0 pg   MCHC 31.4 30.0 - 36.0 g/dL   RDW 13.3 11.5 - 15.5 %   Platelets 226 150 - 400 K/uL   nRBC 0.0 0.0 - 0.2 %  TSH     Status: None   Collection Time: 01/30/20  2:41 AM  Result Value Ref Range   TSH 2.029 0.350 - 4.500 uIU/mL  T4, free     Status: None   Collection Time: 01/30/20  2:41 AM  Result Value Ref Range   Free T4 1.12 0.61 - 1.12 ng/dL  Glucose, capillary     Status: None   Collection Time: 01/30/20  4:00 AM  Result Value Ref Range   Glucose-Capillary 79 70 - 99 mg/dL  Glucose, capillary     Status: None   Collection Time: 01/30/20  6:05 AM  Result Value Ref Range   Glucose-Capillary 95 70 - 99 mg/dL  Glucose, capillary     Status: None    Collection Time: 01/30/20  7:17 AM  Result Value Ref Range   Glucose-Capillary 84 70 - 99 mg/dL  Glucose, capillary     Status: None   Collection Time: 01/30/20  9:31 AM  Result Value Ref Range   Glucose-Capillary 77 70 - 99 mg/dL   Recent Results (from the past 240 hour(s))  SARS Coronavirus 2 by RT PCR (hospital order, performed in [place] hospital lab) Nasopharyngeal Nasopharyngeal Swab     Status: None   Collection Time: 01/29/20 12:20 PM   Specimen: Nasopharyngeal Swab  Result Value Ref Range Status   SARS Coronavirus 2 NEGATIVE NEGATIVE Final    Comment: (NOTE) SARS-CoV-2 target nucleic acids are NOT DETECTED.  The SARS-CoV-2 RNA is generally detectable in upper and lower respiratory specimens during the acute phase of infection. The lowest concentration of SARS-CoV-2 viral copies this assay can detect is 250 copies / mL. A negative result does not preclude SARS-CoV-2 infection and should not be used as the sole basis for treatment or other patient management decisions.  A negative result may occur with improper specimen collection / handling, submission of specimen other than nasopharyngeal swab, presence of viral mutation(s) within the areas targeted by this assay, and inadequate number of viral copies (<250 copies / mL). A negative result must be combined with clinical observations, patient history, and epidemiological information.  Fact Sheet for Patients:   StrictlyIdeas.no  Fact Sheet for Healthcare Providers: BankingDealers.co.za  This test is not yet approved or  cleared by the [place] FDA and has been authorized for detection and/or diagnosis of SARS-CoV-2 by FDA under an Emergency Use Authorization (EUA).  This EUA will remain in effect (meaning this test can be used) for the duration of the COVID-19 declaration under Section 564(b)(1) of the Act, 21 U.S.C. section 360bbb-3(b)(1), unless the  authorization is terminated or revoked sooner.  Performed at [facility], [place] [address], [place], [place] [address]     Renal Function: Recent Labs    01/29/20 0817 01/30/20 0241  CREATININE 0.88 0.63   Estimated Creatinine Clearance: 34.8 mL/min (  by C-G formula based on SCr of 0.63 mg/dL).  Radiologic Imaging: CT Abdomen Pelvis Wo Contrast  Result Date: 01/29/2020 CLINICAL DATA:  Abdominal pain, acute and non localized. EXAM: CT ABDOMEN AND PELVIS WITHOUT CONTRAST TECHNIQUE: Multidetector CT imaging of the abdomen and pelvis was performed following the standard protocol without IV contrast. COMPARISON:  06/03/2015 FINDINGS: Lower chest: Trace bilateral pleural effusions. Dependent atelectasis identified at the lung bases. The heart is mildly enlarged. There is coronary artery calcification. Atherosclerotic calcification of the root of the aorta. No pericardial effusion. Hepatobiliary: Stable cyst within the dome of the LEFT hepatic lobe is 2.0 centimeters. No suspicious hepatic lesion. Status post cholecystectomy. Pancreas: Unremarkable. No pancreatic ductal dilatation or surrounding inflammatory changes. Spleen: Normal in size without focal abnormality. Adrenals/Urinary Tract: Adrenal glands are normal. Multiple bilateral renal cysts appear stable. Largest is on the RIGHT, measuring 5.0 centimeters. No hydronephrosis. Ureters are unremarkable. Focal calcifications in the renal hilar regions are favored to represent atherosclerotic calcification of the renal arteries. Small intrarenal calculi cannot be excluded. Stomach/Bowel: There is fecal impaction of the sigmoid colon. Sigmoid is distended to 8.4 centimeters by a large amount of stool. Large stool burden throughout the colon. There are numerous diverticula but no evidence for acute diverticulitis. The appendix is well seen and has a normal appearance. Stomach and small bowel loops are unremarkable. Vascular/Lymphatic:  There is significant atherosclerotic calcification of the abdominal aorta not associated with aneurysm. No retroperitoneal or mesenteric adenopathy. Reproductive: Hysterectomy.  No adnexal mass. Other: No ascites.  Anterior abdominal wall is unremarkable. Musculoskeletal: Bones appear lucent. Scoliosis and degenerative changes in the spine. No acute fracture. Remote LEFT hip ORIF. IMPRESSION: 1. Fecal impaction of the sigmoid colon. Sigmoid is distended to 8.4 centimeters by stool. 2. Large stool burden throughout the colon. 3. Colonic diverticulosis without evidence for acute diverticulitis. 4. Normal appendix. 5. Trace bilateral pleural effusions and bibasilar atelectasis. 6. Cardiomegaly and coronary artery disease. 7. Stable appearance of hepatic and renal cysts. 8. Bones appear lucent.  Suspect osteopenia/osteoporosis. 9. Aortic Atherosclerosis (ICD10-I70.0). Electronically Signed   By: Nolon Nations M.D.   On: 01/29/2020 10:03    I independently reviewed the above imaging studies.   Impression/Recommendation: Urinary retention, difficult foley catheter placement in the setting of contractures, vaginal atrophy.  - Continue foley catheter per primary team. Recommend treatment of stool impaction prior to catheter removal, as this will likely contribute to persistent urinary retention. Would also recommend continuing through treatment of presumed UTI.  Carmie Kanner 01/30/2020, 10:49 AM

## 2020-01-31 LAB — BASIC METABOLIC PANEL
Anion gap: 7 (ref 5–15)
BUN: 16 mg/dL (ref 8–23)
CO2: 23 mmol/L (ref 22–32)
Calcium: 8.1 mg/dL — ABNORMAL LOW (ref 8.9–10.3)
Chloride: 106 mmol/L (ref 98–111)
Creatinine, Ser: 0.99 mg/dL (ref 0.44–1.00)
GFR calc Af Amer: 55 mL/min — ABNORMAL LOW (ref >60)
GFR calc non Af Amer: 47 mL/min — ABNORMAL LOW (ref >60)
Glucose, Bld: 69 mg/dL — ABNORMAL LOW (ref 70–99)
Potassium: 3.6 mmol/L (ref 3.5–5.1)
Sodium: 136 mmol/L (ref 135–145)

## 2020-01-31 LAB — CBC
HCT: 32.3 % — ABNORMAL LOW (ref 36.0–46.0)
Hemoglobin: 10.3 g/dL — ABNORMAL LOW (ref 12.0–15.0)
MCH: 30.7 pg (ref 26.0–34.0)
MCHC: 31.9 g/dL (ref 30.0–36.0)
MCV: 96.1 fL (ref 80.0–100.0)
Platelets: 216 10*3/uL (ref 150–400)
RBC: 3.36 MIL/uL — ABNORMAL LOW (ref 3.87–5.11)
RDW: 13.2 % (ref 11.5–15.5)
WBC: 5.3 10*3/uL (ref 4.0–10.5)
nRBC: 0 % (ref 0.0–0.2)

## 2020-01-31 LAB — URINE CULTURE: Culture: >=100000 — AB

## 2020-01-31 MED ORDER — BISACODYL 10 MG RE SUPP: 10.0000 mg | Freq: Every day | RECTAL | 0 refills | Status: DC | PRN

## 2020-01-31 MED ORDER — FLEET ENEMA 7-19 GM/118ML RE ENEM
1.0000 | ENEMA | Freq: Every day | RECTAL | 0 refills | Status: DC | PRN
Start: 2020-01-31 — End: 2020-01-31

## 2020-01-31 MED ORDER — BISACODYL 10 MG RE SUPP: 10.0000 mg | Freq: Every day | RECTAL | Status: DC | PRN

## 2020-01-31 MED ORDER — POLYETHYLENE GLYCOL 3350 17 G PO PACK: 17.0000 g | PACK | Freq: Two times a day (BID) | ORAL | 0 refills | Status: DC

## 2020-01-31 MED ORDER — CEPHALEXIN 500 MG PO CAPS: 500.0000 mg | ORAL_CAPSULE | Freq: Two times a day (BID) | ORAL | 0 refills | Status: AC

## 2020-01-31 MED ORDER — FLEET ENEMA 7-19 GM/118ML RE ENEM
1.0000 | ENEMA | Freq: Every day | RECTAL | 3 refills | Status: DC | PRN
Start: 2020-01-31 — End: 2020-04-15

## 2020-01-31 MED ORDER — ENOXAPARIN SODIUM 30 MG/0.3ML ~~LOC~~ SOLN: 30.0000 mg | SUBCUTANEOUS | Status: DC

## 2020-01-31 NOTE — TOC Transition Note (Signed)
Transition of Care [facility]) - CM/SW Discharge Note   Patient Details  Name: Kathryn Whitaker MRN: [id number] Date of Birth: September 17, 1919  [facility] CM/SW Contact:  Lennart Pall, LCSW Phone Number: 01/31/2020, 2:22 PM   Clinical Narrative:    Pt medically cleared for return to [facility] under Hospice coverage.  Pt and daughter aware and agreed.  [facility] for transport.  No further TOC needs.   Final next level of care: Assisted Living Barriers to Discharge: Barriers Resolved   Patient Goals and CMS Choice        Discharge Placement                Patient to be transferred to facility by: [facility] Name of family member notified: daughter, Rachell Cipro Patient and family notified of of transfer: 01/31/20  Discharge Plan and Services                DME Arranged: N/A DME Agency: NA         HH Agency: [facility] and [place] [facility]) Interventions     Readmission Risk Interventions No flowsheet data found.

## 2020-01-31 NOTE — NC FL2 (Signed)
[place] MEDICAID FL2 LEVEL OF CARE SCREENING TOOL     IDENTIFICATION  Patient Name: Kathryn Whitaker Birthdate: [birth year] Sex: female Admission Date (Current Location): 01/29/2020  [facility] and [place] Number:  Herbalist and Address:  [facility],  [place], [place]      Provider Number: [id number]  Attending Physician Name and Address:  Debbe Odea, MD  Relative Name and Phone Number:  daughter, Rachell Cipro @ [phone]    Current Level of Care: Hospital Recommended Level of Care: Assisted Living Facility Prior Approval Number:    Date Approved/Denied:   PASRR Number:    Discharge Plan: Domiciliary (Rest home)    Current Diagnoses: Patient Active Problem List   Diagnosis Date Noted   Acute urinary retention 01/30/2020   Dehydration 01/30/2020   UTI (urinary tract infection) 01/29/2020   Fecal impaction ([place]) 67/67/2094   Acute metabolic encephalopathy 70/96/2836   AMS (altered mental status) 04/27/2019   Hallucinations    Acute blood loss anemia 01/15/2017   Hip fracture, left ([place]) 01/12/2017   Hip fracture ([place]) 01/12/2017   S/p left hip fracture 01/12/2017   Dementia with psychosis ([place]) 07/26/2016   Dysuria 08/10/2015   Hypothyroidism 06/06/2015   Protein-calorie malnutrition, severe ([place]) 06/26/2014   Constipation 12/17/2013   COPD (chronic obstructive pulmonary disease) ([place]) 06/03/2013   IBS (irritable bowel syndrome) 12/30/2012   Generalized anxiety disorder 12/30/2012   Dyslipidemia 12/12/2012   HTN (hypertension) 12/12/2012   Hx of completed stroke 07/11/2009    Orientation RESPIRATION BLADDER Height & Weight     Self, Situation  Normal Continent, Incontinent Weight: 143 lb 4.8 oz (65 kg) Height:  5\' 2"  (157.5 cm)  BEHAVIORAL SYMPTOMS/MOOD NEUROLOGICAL BOWEL NUTRITION STATUS      Incontinent    AMBULATORY STATUS COMMUNICATION OF NEEDS Skin   Total Care   Normal                        Personal Care Assistance Level of Assistance  Bathing, Dressing Bathing Assistance: Maximum assistance   Dressing Assistance: Limited assistance     Functional Limitations Info             SPECIAL CARE FACTORS FREQUENCY                       Contractures Contractures Info: Not present    Additional Factors Info  Psychotropic, Code Status, Allergies Code Status Info: DNR Allergies Info: see MAR Psychotropic Info: see MAR         Current Medications (01/31/2020):  This is the current hospital active medication list Current Facility-Administered Medications  Medication Dose Route Frequency Provider Last Rate Last Admin   acetaminophen (TYLENOL) tablet 500 mg  500 mg Oral Q4H PRN Debbe Odea, MD   500 mg at 01/31/20 6294   aspirin chewable tablet 81 mg  81 mg Oral Daily Debbe Odea, MD   81 mg at 01/30/20 1435   bisacodyl (DULCOLAX) suppository 10 mg  10 mg Rectal Daily PRN Debbe Odea, MD       cefTRIAXone (ROCEPHIN) 1 g in sodium chloride 0.9 % 100 mL IVPB  1 g Intravenous Q24H Bonnielee Haff, MD 200 mL/hr at 01/30/20 1428 1 g at 01/30/20 1428   enoxaparin (LOVENOX) injection 30 mg  30 mg Subcutaneous Q24H Poindexter, Leann T, RPH       feeding supplement (BOOST / RESOURCE BREEZE) liquid 1 Container  1 Container Oral TID BM Debbe Odea, MD   1 Container at 01/30/20 2032   gabapentin (NEURONTIN) capsule 100 mg  100 mg Oral BID Debbe Odea, MD   100 mg at 01/30/20 2033   haloperidol (HALDOL) 2 MG/ML solution 0.26 mg  0.26 mg Oral TID Debbe Odea, MD   0.26 mg at 01/30/20 2038   lamoTRIgine (LAMICTAL) tablet 50 mg  50 mg Oral BID Debbe Odea, MD   50 mg at 01/30/20 1726   levothyroxine (SYNTHROID) tablet 50 mcg  50 mcg Oral Q0600 Debbe Odea, MD   50 mcg at 01/31/20 0558   ondansetron (ZOFRAN) tablet 4 mg  4 mg Oral Q6H PRN Bonnielee Haff, MD       Or   ondansetron [facility]) injection 4 mg  4 mg Intravenous Q6H PRN Bonnielee Haff,  MD       polyethylene glycol (MIRALAX / GLYCOLAX) packet 17 g  17 g Oral BID Bonnielee Haff, MD   17 g at 01/30/20 2034   sodium phosphate (FLEET) 7-19 GM/118ML enema 1 enema  1 enema Rectal Once PRN Bonnielee Haff, MD       traMADol Veatrice [place]) tablet 50 mg  50 mg Oral BID Debbe Odea, MD   50 mg at 01/30/20 2033     Discharge Medications: Please see discharge summary for a list of discharge medications.  Relevant Imaging Results:  Relevant Lab Results:   Additional Information SS#:[id number]  Zariana Strub, LCSW

## 2020-01-31 NOTE — Discharge Summary (Signed)
Physician Discharge Summary  Kathryn Whitaker JJH:[id number] DOB: Mar 07, 1920 DOA: 01/29/2020  PCP: Hoyt Koch, MD  Admit date: 01/29/2020 Discharge date: 01/31/2020  Admitted From: ALF  Disposition:  ALF   Recommendations for Outpatient Follow-up:  1. Stop Keflex on 02/04/20 2. Ensure patient is drinking > 1 L per day 3. Ensure patient having BM daily  Discharge Condition:  stable   CODE STATUS:  DNR   Diet recommendation:  Regular diet Consultations:  Urology Procedures/Studies: . Foley cath   Discharge Diagnoses:  Principal Problem:   Acute metabolic encephalopathy Active Problems:   UTI (urinary tract infection)   Fecal impaction (HCC)   Acute urinary retention   Dehydration   Protein-calorie malnutrition, severe (HCC)   Hypothyroidism   Dementia with psychosis ([place])     Brief Summary: Kathryn Whitaker is a 86 y.o.femalewho lives in a nursing facility and who has dementia with psychosis, hypothyroidism, history of irritable bowel syndrome, history of UTIs and is on hospice was sent here to the emergency department as patient was poorly responsive.   Daughter is at the bedside and provided most of the information. Patient is arousable but does not really answer many questions. According to the daughter patient has been progressively getting worse for the past 2 to 3 months. She has lost a lot of weight. Her appetite has been poor. There has been no reports of any nausea or vomiting. No diarrhea. She has had some constipation. Patient has been denying any pain. She also developed contractures of her upper extremity. She has not been really able to walk for the last year or so. Gets around in a wheelchair.  In ED > lethargic, found to have a UTI, urinary retention, low glucose levels and fecal impaction Started on Ceftriaxone   Hospital Course:  Principal Problem:   Acute metabolic encephalopathy - due to UTI and dehydration- now alert and at  baseline  Active Problems:' Dehydration, hypoglycemia    Protein-calorie malnutrition, severe - given NS bolus and then D51/2 NS -  She is eating and drinking well now- stable to go back to ALF    UTI (urinary tract infection) with urinary retention - cont Ceftriaxone- - UA> 100 K proteus - foley placed by urology - day 3 of Ceftriaxone- has grown out e coli which is sensitive to this- will transition to Keflex to complete 7 days    Fecal impaction  - has been treated and resolved- this appears to be a recurrent issues - she has been started on a number of laxatives to prevent recurrent fecal impacion    Hypothyroidism - cont Synthroid    Dementia with psychosis ([place]) - cont all outpt meds    Discharge Exam: Vitals:   01/30/20 2303 01/31/20 0600  BP: (!) 130/57 127/69  Pulse: 67 71  Resp: 16 16  Temp: 97.7 F (36.5 C) 97.9 F (36.6 C)  SpO2: 100% 95%   Vitals:   01/30/20 0359 01/30/20 1427 01/30/20 2303 01/31/20 0600  BP: (!) 141/63 (!) 141/62 (!) 130/57 127/69  Pulse: 71 65 67 71  Resp: 17 20 16 16   Temp: 97.7 F (36.5 C) 97.9 F (36.6 C) 97.7 F (36.5 C) 97.9 F (36.6 C)  TempSrc: Oral Oral Axillary Axillary  SpO2: 96% 100% 100% 95%  Weight:      Height:        General: Pt is alert, awake, not in acute distress Cardiovascular: RRR, S1/S2 +, no rubs, no gallops Respiratory: CTA  bilaterally, no wheezing, no rhonchi Abdominal: Soft, NT, ND, bowel sounds + Extremities: no edema, no cyanosis   Discharge Instructions  Discharge Instructions    Diet - low sodium heart healthy   Complete by: As directed    Increase activity slowly   Complete by: As directed      Allergies as of 01/31/2020      Reactions   Demerol [meperidine]    Pt states she can't remember type of reaction   Influenza Vaccines    Pt can't remember reaction but states allergic and has not had in 25 years   Amoxicillin    PER MAR   Morphine And Related Dermatitis   Peanuts  [peanut Oil]    Pt reports she is allergic to peanuts, but was asking to eat peanut butter. Pt unsure of her reaction.    Prevpac [amoxicill-clarithro-lansopraz] Hives   .Marland KitchenHas patient had a PCN reaction causing immediate rash, facial/tongue/throat swelling, SOB or lightheadedness with hypotension: unknown Has patient had a PCN reaction causing severe rash involving mucus membranes or skin necrosis: unknown Has patient had a PCN reaction that required hospitalization unknown Has patient had a PCN reaction occurring within the last 10 years: unknown If all of the above answers are "NO", then may proceed with Cephalosporin use.   Telithromycin Hives   REACTION: pt states HIVES....      Medication List    TAKE these medications   acetaminophen 500 MG tablet Commonly known as: TYLENOL Take 500 mg by mouth every 4 (four) hours as needed for mild pain, fever or headache.   albuterol 108 (90 Base) MCG/ACT inhaler Commonly known as: Proventil HFA Inhale 2 puffs into the lungs every 6 (six) hours as needed. For shortness of breath   aspirin 81 MG chewable tablet Chew 81 mg by mouth daily.   BAZA EX Apply 1 application topically See admin instructions. Apply to buttocks bid and after each incontinence episode   bisacodyl 10 MG suppository Commonly known as: DULCOLAX Place 1 suppository (10 mg total) rectally daily as needed for moderate constipation.   feeding supplement Liqd Take 1 Container by mouth 3 (three) times daily between meals.   gabapentin 100 MG capsule Commonly known as: NEURONTIN Take 100 mg by mouth 2 (two) times daily.   guaifenesin 100 MG/5ML syrup Commonly known as: ROBITUSSIN Take 200 mg by mouth every 6 (six) hours as needed for cough.   haloperidol 2 MG/ML solution Commonly known as: HALDOL Take 0.25 mg by mouth 3 (three) times daily.   lamoTRIgine 25 MG tablet Commonly known as: LAMICTAL Take 3 tablets (75 mg total) by mouth 2 (two) times daily. What  changed: how much to take   levothyroxine 50 MCG tablet Commonly known as: SYNTHROID TAKE 1 TABLET (50 MCG TOTAL) BY MOUTH DAILY BEFORE BREAKFAST.   loperamide 2 MG capsule Commonly known as: IMODIUM Take 2 mg by mouth as needed for diarrhea or loose stools.   magnesium hydroxide 400 MG/5ML suspension Commonly known as: MILK OF MAGNESIA Take 30 mLs by mouth at bedtime as needed for mild constipation.   Mintox Regular Strength [phone] MG/5ML suspension Generic drug: alum & mag hydroxide-simeth Take 30 mLs by mouth every 6 (six) hours as needed for indigestion or heartburn.   neomycin-bacitracin-polymyxin [phone] ointment Apply 1 application topically daily as needed (skin abrasions).   polyethylene glycol 17 g packet Commonly known as: MIRALAX / GLYCOLAX Take 17 g by mouth 2 (two) times daily.   PRESCRIPTION MEDICATION  Take 1 each by mouth in the morning, at noon, and at bedtime. Mighty shake   senna 8.6 MG Tabs tablet Commonly known as: SENOKOT Take 2 tablets by mouth daily.   sodium phosphate 7-19 GM/118ML Enem Place 133 mLs (1 enema total) rectally daily as needed for severe constipation.   traMADol 50 MG tablet Commonly known as: ULTRAM Take 50 mg by mouth 2 (two) times daily.   trolamine salicylate 10 % cream Commonly known as: ASPERCREME Apply 1 application topically in the morning and at bedtime. Right knee       Allergies  Allergen Reactions  . Demerol [Meperidine]     Pt states she can't remember type of reaction  . Influenza Vaccines     Pt can't remember reaction but states allergic and has not had in 25 years  . Amoxicillin     PER MAR  . Morphine And Related Dermatitis  . Peanuts [Peanut Oil]     Pt reports she is allergic to peanuts, but was asking to eat peanut butter. Pt unsure of her reaction.   [name] [Amoxicill-Clarithro-Lansopraz] Hives    .Marland KitchenHas patient had a PCN reaction causing immediate rash, facial/tongue/throat swelling, SOB  or lightheadedness with hypotension: unknown Has patient had a PCN reaction causing severe rash involving mucus membranes or skin necrosis: unknown Has patient had a PCN reaction that required hospitalization unknown Has patient had a PCN reaction occurring within the last 10 years: unknown If all of the above answers are "NO", then may proceed with Cephalosporin use.   . Telithromycin Hives    REACTION: pt states HIVES....      CT Abdomen Pelvis Wo Contrast  Result Date: 01/29/2020 CLINICAL DATA:  Abdominal pain, acute and non localized. EXAM: CT ABDOMEN AND PELVIS WITHOUT CONTRAST TECHNIQUE: Multidetector CT imaging of the abdomen and pelvis was performed following the standard protocol without IV contrast. COMPARISON:  06/03/2015 FINDINGS: Lower chest: Trace bilateral pleural effusions. Dependent atelectasis identified at the lung bases. The heart is mildly enlarged. There is coronary artery calcification. Atherosclerotic calcification of the root of the aorta. No pericardial effusion. Hepatobiliary: Stable cyst within the dome of the LEFT hepatic lobe is 2.0 centimeters. No suspicious hepatic lesion. Status post cholecystectomy. Pancreas: Unremarkable. No pancreatic ductal dilatation or surrounding inflammatory changes. Spleen: Normal in size without focal abnormality. Adrenals/Urinary Tract: Adrenal glands are normal. Multiple bilateral renal cysts appear stable. Largest is on the RIGHT, measuring 5.0 centimeters. No hydronephrosis. Ureters are unremarkable. Focal calcifications in the renal hilar regions are favored to represent atherosclerotic calcification of the renal arteries. Small intrarenal calculi cannot be excluded. Stomach/Bowel: There is fecal impaction of the sigmoid colon. Sigmoid is distended to 8.4 centimeters by a large amount of stool. Large stool burden throughout the colon. There are numerous diverticula but no evidence for acute diverticulitis. The appendix is well seen and has  a normal appearance. Stomach and small bowel loops are unremarkable. Vascular/Lymphatic: There is significant atherosclerotic calcification of the abdominal aorta not associated with aneurysm. No retroperitoneal or mesenteric adenopathy. Reproductive: Hysterectomy.  No adnexal mass. Other: No ascites.  Anterior abdominal wall is unremarkable. Musculoskeletal: Bones appear lucent. Scoliosis and degenerative changes in the spine. No acute fracture. Remote LEFT hip ORIF. IMPRESSION: 1. Fecal impaction of the sigmoid colon. Sigmoid is distended to 8.4 centimeters by stool. 2. Large stool burden throughout the colon. 3. Colonic diverticulosis without evidence for acute diverticulitis. 4. Normal appendix. 5. Trace bilateral pleural effusions and bibasilar atelectasis. 6. Cardiomegaly  and coronary artery disease. 7. Stable appearance of hepatic and renal cysts. 8. Bones appear lucent.  Suspect osteopenia/osteoporosis. 9. Aortic Atherosclerosis (ICD10-I70.0). Electronically Signed   By: Nolon Nations M.D.   On: 01/29/2020 10:03     The results of significant diagnostics from this hospitalization (including imaging, microbiology, ancillary and laboratory) are listed below for reference.     Microbiology: Recent Results (from the past 240 hour(s))  SARS Coronavirus 2 by RT PCR (hospital order, performed in [facility] hospital lab) Nasopharyngeal Nasopharyngeal Swab     Status: None   Collection Time: 01/29/20 12:20 PM   Specimen: Nasopharyngeal Swab  Result Value Ref Range Status   SARS Coronavirus 2 NEGATIVE NEGATIVE Final    Comment: (NOTE) SARS-CoV-2 target nucleic acids are NOT DETECTED.  The SARS-CoV-2 RNA is generally detectable in upper and lower respiratory specimens during the acute phase of infection. The lowest concentration of SARS-CoV-2 viral copies this assay can detect is 250 copies / mL. A negative result does not preclude SARS-CoV-2 infection and should not be used as the sole basis  for treatment or other patient management decisions.  A negative result may occur with improper specimen collection / handling, submission of specimen other than nasopharyngeal swab, presence of viral mutation(s) within the areas targeted by this assay, and inadequate number of viral copies (<250 copies / mL). A negative result must be combined with clinical observations, patient history, and epidemiological information.  Fact Sheet for Patients:   StrictlyIdeas.no  Fact Sheet for Healthcare Providers: BankingDealers.co.za  This test is not yet approved or  cleared by the [place] FDA and has been authorized for detection and/or diagnosis of SARS-CoV-2 by FDA under an Emergency Use Authorization (EUA).  This EUA will remain in effect (meaning this test can be used) for the duration of the COVID-19 declaration under Section 564(b)(1) of the Act, 21 U.S.C. section 360bbb-3(b)(1), unless the authorization is terminated or revoked sooner.  Performed at [facility], [place] [address], [place], [place] [address]   Culture, Urine     Status: Abnormal   Collection Time: 01/29/20  2:12 PM   Specimen: Urine, Random  Result Value Ref Range Status   Specimen Description   Final    URINE, RANDOM Performed at [facility] [address], [place], [place] [address]    Special Requests   Final    NONE Performed at [facility], [place] [address] [place],  [address]    Culture >=100,000 COLONIES/mL PROTEUS MIRABILIS (A)  Final   Report Status 01/31/2020 FINAL  Final   Organism ID, Bacteria PROTEUS MIRABILIS (A)  Final      Susceptibility   Proteus mirabilis - MIC*    AMPICILLIN <=2 SENSITIVE Sensitive     CEFAZOLIN <=4 SENSITIVE Sensitive     CEFTRIAXONE <=0.25 SENSITIVE Sensitive     CIPROFLOXACIN >=4 RESISTANT Resistant     GENTAMICIN 8 INTERMEDIATE Intermediate     IMIPENEM  <=0.25 SENSITIVE Sensitive     NITROFURANTOIN >=512 RESISTANT Resistant     TRIMETH/SULFA >=320 RESISTANT Resistant     AMPICILLIN/SULBACTAM <=2 SENSITIVE Sensitive     PIP/TAZO <=4 SENSITIVE Sensitive     * >=100,000 COLONIES/mL PROTEUS MIRABILIS     Labs: BNP (last 3 results) No results for input(s): BNP in the last 8760 hours. Basic Metabolic Panel: Recent Labs  Lab 01/29/20 0817 01/30/20 0241 01/31/20 0258  NA 142 142 136  K 4.2 3.8 3.6  CL 105 110 106  CO2 29 24 23   GLUCOSE 67* 90 69*  BUN 18 14 16   CREATININE 0.88 0.63 0.99  CALCIUM 9.2 8.7* 8.1*   Liver Function Tests: Recent Labs  Lab 01/30/20 0241  AST 17  ALT 13  ALKPHOS 64  BILITOT 0.5  PROT 6.1*  ALBUMIN 2.9*   No results for input(s): LIPASE, AMYLASE in the last 168 hours. No results for input(s): AMMONIA in the last 168 hours. CBC: Recent Labs  Lab 01/29/20 0817 01/30/20 0241 01/31/20 0258  WBC 5.0 4.9 5.3  NEUTROABS 2.0  --   --   HGB 11.8* 11.7* 10.3*  HCT 38.1 37.3 32.3*  MCV 96.9 95.4 96.1  PLT 150 226 216   Cardiac Enzymes: No results for input(s): CKTOTAL, CKMB, CKMBINDEX, TROPONINI in the last 168 hours. BNP: Invalid input(s): POCBNP CBG: Recent Labs  Lab 01/30/20 0400 01/30/20 0605 01/30/20 0717 01/30/20 0931 01/30/20 1143  GLUCAP 79 95 84 77 95   D-Dimer No results for input(s): DDIMER in the last 72 hours. Hgb A1c No results for input(s): HGBA1C in the last 72 hours. Lipid Profile No results for input(s): CHOL, HDL, LDLCALC, TRIG, CHOLHDL, LDLDIRECT in the last 72 hours. Thyroid function studies Recent Labs    01/30/20 0241  TSH 2.029   Anemia work up No results for input(s): VITAMINB12, FOLATE, FERRITIN, TIBC, IRON, RETICCTPCT in the last 72 hours. Urinalysis    Component Value Date/Time   COLORURINE YELLOW 01/29/2020 0817   APPEARANCEUR CLOUDY (A) 01/29/2020 0817   LABSPEC 1.016 01/29/2020 0817   PHURINE 8.0 01/29/2020 0817   GLUCOSEU 50 (A) 01/29/2020  0817   GLUCOSEU NEGATIVE 06/03/2013 1035   HGBUR SMALL (A) 01/29/2020 0817   BILIRUBINUR NEGATIVE 01/29/2020 0817   BILIRUBINUR 1+ 07/26/2016 1027   KETONESUR NEGATIVE 01/29/2020 0817   PROTEINUR 100 (A) 01/29/2020 0817   UROBILINOGEN 1.0 07/26/2016 1027   UROBILINOGEN 0.2 03/13/2015 1255   NITRITE POSITIVE (A) 01/29/2020 0817   LEUKOCYTESUR LARGE (A) 01/29/2020 0817   Sepsis Labs Invalid input(s): PROCALCITONIN,  WBC,  LACTICIDVEN Microbiology Recent Results (from the past 240 hour(s))  SARS Coronavirus 2 by RT PCR (hospital order, performed in [place] hospital lab) Nasopharyngeal Nasopharyngeal Swab     Status: None   Collection Time: 01/29/20 12:20 PM   Specimen: Nasopharyngeal Swab  Result Value Ref Range Status   SARS Coronavirus 2 NEGATIVE NEGATIVE Final    Comment: (NOTE) SARS-CoV-2 target nucleic acids are NOT DETECTED.  The SARS-CoV-2 RNA is generally detectable in upper and lower respiratory specimens during the acute phase of infection. The lowest concentration of SARS-CoV-2 viral copies this assay can detect is 250 copies / mL. A negative result does not preclude SARS-CoV-2 infection and should not be used as the sole basis for treatment or other patient management decisions.  A negative result may occur with improper specimen collection / handling, submission of specimen other than nasopharyngeal swab, presence of viral mutation(s) within the areas targeted by this assay, and inadequate number of viral copies (<250 copies / mL). A negative result must be combined with clinical observations, patient history, and epidemiological information.  Fact Sheet for Patients:   StrictlyIdeas.no  Fact Sheet for Healthcare Providers: BankingDealers.co.za  This test is not yet approved or  cleared by the [place] FDA and has been authorized for detection and/or diagnosis of SARS-CoV-2 by FDA under an Emergency Use  Authorization (EUA).  This EUA will remain in effect (meaning this  test can be used) for the duration of the COVID-19 declaration under Section 564(b)(1) of the Act, 21 U.S.C. section 360bbb-3(b)(1), unless the authorization is terminated or revoked sooner.  Performed at [facility], [place] [address] [place], [place] [address]   Culture, Urine     Status: Abnormal   Collection Time: 01/29/20  2:12 PM   Specimen: Urine, Random  Result Value Ref Range Status   Specimen Description   Final    URINE, RANDOM Performed at [facility] [address] [place], [place] [address]    Special Requests   Final    NONE Performed at [facility], [place] [address] [place], [place] [address]    Culture >=100,000 COLONIES/mL PROTEUS MIRABILIS (A)  Final   Report Status 01/31/2020 FINAL  Final   Organism ID, Bacteria PROTEUS MIRABILIS (A)  Final      Susceptibility   Proteus mirabilis - MIC*    AMPICILLIN <=2 SENSITIVE Sensitive     CEFAZOLIN <=4 SENSITIVE Sensitive     CEFTRIAXONE <=0.25 SENSITIVE Sensitive     CIPROFLOXACIN >=4 RESISTANT Resistant     GENTAMICIN 8 INTERMEDIATE Intermediate     IMIPENEM <=0.25 SENSITIVE Sensitive     NITROFURANTOIN >=512 RESISTANT Resistant     TRIMETH/SULFA >=320 RESISTANT Resistant     AMPICILLIN/SULBACTAM <=2 SENSITIVE Sensitive     PIP/TAZO <=4 SENSITIVE Sensitive     * >=100,000 COLONIES/mL PROTEUS MIRABILIS     Time coordinating discharge in minutes: 65  SIGNED:   Debbe Odea, MD  [facility] 01/31/2020, 1:41 PM

## 2020-01-31 NOTE — Progress Notes (Signed)
[facility] - Manufacturing engineer [facility]) [facility] Liaison Note     Patient was sent to the [facility] ED from [facility] for decreased responsiveness, decreased oral intake and recent combativeness.  [facility] on call services were notified by SNF. Patient was admitted on 01/29/19 for a UTI and fecal impaction. [facility] hospice diagnosis is Dementia and has been on our services since 08/19/19. Per Dr. Alda Ponder of [facility], this is a related hospital admission.  Patient is a DNR.   Checked in with bedside RN who stated that patient had a restful night with plans for discharge back to [facility] SNF this afternoon. Spoke with patient daughter Kathryn Whitaker) via telephone to support and update on patient condition. Visited patient at bedside, who was alert/pleasantly confused in NAD. Patient stated that she ate a full breakfast, had no complaints of discomfort at this time.    V/S:  97.9, [id number], sats at 95% on room air  I&O: 120/825- -705cc Abnormal lab work: (01/31/20) RBC 3.36, Hemo 10.3, HCT 32.3, Cal 8.1, glucose 69 Diagnostics:  No new imaging in last 24 hours IVs/PRNs: adm 500mg  PO Tylenol at 0558 on 8/9   EPIC MD Problem list: Principal Problem:  Acute metabolic encephalopathy - due to UTI and dehydration- now alert and at baseline - cont Ceftriaxone  Active Problems:' Dehydration, hypoglycemia - given NS bolus and then D51/2 NS - start a diet and she if she will eat and drink today but continue IVF until we are sure she is taking enough liquids to prevent recurrent dehydration and eating enough food to prevent recurrent hypolycemia     UTI (urinary tract infection) with urinary retention - cont Ceftriaxone- - UA> 100 K proteus - foley placed by urology   Fecal impaction  - has only had 1 BM thus far- give a smog enema today - once impaction resolved, can give voiding trial    Protein-calorie malnutrition, severe - follow oral intake over the next 24 hrs to see if it is adequate     Hypothyroidism - cont Synthroid    Dementia with psychosis ([place]) - cont all home meds- may need a sitter but currently her daughter is present    D/C planning- Plan is to discharge patient back to [facility] this afternoon via ambulance - please use [facility] for all [facility] hospitalized patients. Goals of Care:  Clear - patient is a DNR and continues to seek comfort care with support of hospice. Communication with IDT-  [facility] IDT updated. Communication with PCG: spoke with patient daughter Kathryn Whitaker to support.    Please call with any hospice related questions/concerns,    Gar Ponto, RN  RN PRN/Hospital Liaison  [phone], [phone]

## 2020-01-31 NOTE — Progress Notes (Signed)
Initial Nutrition Assessment  DOCUMENTATION CODES:   Not applicable  INTERVENTION:  - will provide interventions if patient is unable to d/c back to the facility today.   NUTRITION DIAGNOSIS:   Increased nutrient needs related to acute illness as evidenced by estimated needs.  GOAL:   Patient will meet greater than or equal to 90% of their needs  MONITOR:   PO intake, Labs, Weight trends  REASON FOR ASSESSMENT:   Malnutrition Screening Tool  ASSESSMENT:   86 y.o. female with medical history of dementia with psychosis, hypothyroidism, IBS, hx of UTIs, and is on hospice at the facility at which she resides. She was sent to the ED due to unresponsiveness and poor PO intakes. No reported N/V/D; some constipation. She has contractures of BUE and has not walked for >1 year. She uses a wheelchair. In the ED, she was found to have a UTI, urinary retention, hypoglycemia, and fecal impaction.  Patient is noted to be a/o to person and situation. She ate 25% of lunch (159 kcal, 8 grams protein) and 0% of dinner yesterday; 100% of breakfast (609 kcal, 16 grams protein) today.   Weight on 8/7 was 143 lb and is exactly the same as what was recorded on 04/28/19; no weights recorded between that time.   Per notes: - plan to discharge back to facility today    Labs reviewed; Ca: 8.1 mg/dl. Medications reviewed; 50 mcg oral synthroid/day, 17 g miralax BID, SMOG enema x1 8/8.   Diet Order:   Diet Order            Diet - low sodium heart healthy           Diet regular Room service appropriate? Yes; Fluid consistency: Thin  Diet effective now                 EDUCATION NEEDS:   No education needs have been identified at this time  Skin:  Skin Assessment: Reviewed RN Assessment  Last BM:  8/9  Height:   Ht Readings from Last 1 Encounters:  01/29/20 5\' 2"  (1.575 m)    Weight:   Wt Readings from Last 1 Encounters:  01/29/20 65 kg    Estimated Nutritional Needs:  Kcal:   1200-1400 kcal Protein:  55-65 grams Fluid:  >/= 1.5 L/day     Jarome Matin, MS, RD, LDN, CNSC Inpatient Clinical Dietitian RD pager # available in [facility]  After hours/weekend pager # available in [facility]

## 2020-01-31 NOTE — Progress Notes (Signed)
Pt had small amount of blood on pad this a.m. Doesn't appear rectal as patient had a bowel movement as well. Paged Jeannette Corpus.

## 2020-04-15 ENCOUNTER — Other Ambulatory Visit: Payer: Self-pay

## 2020-04-15 ENCOUNTER — Emergency Department (HOSPITAL_COMMUNITY)

## 2020-04-15 ENCOUNTER — Encounter (HOSPITAL_COMMUNITY): Payer: Self-pay | Admitting: Internal Medicine

## 2020-04-15 ENCOUNTER — Inpatient Hospital Stay (HOSPITAL_COMMUNITY)
Admission: EM | Admit: 2020-04-15 | Discharge: 2020-04-18 | DRG: 689 | Disposition: A | Discharge status: Nursing Home (Includes ICF) | Source: Skilled Nursing Facility | Attending: Internal Medicine | Admitting: Internal Medicine

## 2020-04-15 DIAGNOSIS — K5909 Other constipation: Secondary | ICD-10-CM | POA: Diagnosis present

## 2020-04-15 DIAGNOSIS — E785 Hyperlipidemia, unspecified: Secondary | ICD-10-CM | POA: Diagnosis present

## 2020-04-15 DIAGNOSIS — E559 Vitamin D deficiency, unspecified: Secondary | ICD-10-CM | POA: Diagnosis present

## 2020-04-15 DIAGNOSIS — Y92009 Unspecified place in unspecified non-institutional (private) residence as the place of occurrence of the external cause: Secondary | ICD-10-CM

## 2020-04-15 DIAGNOSIS — Z66 Do not resuscitate: Secondary | ICD-10-CM | POA: Diagnosis present

## 2020-04-15 DIAGNOSIS — Z79899 Other long term (current) drug therapy: Secondary | ICD-10-CM

## 2020-04-15 DIAGNOSIS — Z515 Encounter for palliative care: Secondary | ICD-10-CM | POA: Insufficient documentation

## 2020-04-15 DIAGNOSIS — K219 Gastro-esophageal reflux disease without esophagitis: Secondary | ICD-10-CM | POA: Diagnosis present

## 2020-04-15 DIAGNOSIS — L8990 Pressure ulcer of unspecified site, unspecified stage: Secondary | ICD-10-CM | POA: Insufficient documentation

## 2020-04-15 DIAGNOSIS — Z91010 Allergy to peanuts: Secondary | ICD-10-CM

## 2020-04-15 DIAGNOSIS — Z20822 Contact with and (suspected) exposure to covid-19: Secondary | ICD-10-CM | POA: Diagnosis present

## 2020-04-15 DIAGNOSIS — S0083XA Contusion of other part of head, initial encounter: Secondary | ICD-10-CM | POA: Diagnosis present

## 2020-04-15 DIAGNOSIS — G9341 Metabolic encephalopathy: Secondary | ICD-10-CM | POA: Diagnosis not present

## 2020-04-15 DIAGNOSIS — J449 Chronic obstructive pulmonary disease, unspecified: Secondary | ICD-10-CM | POA: Diagnosis present

## 2020-04-15 DIAGNOSIS — K589 Irritable bowel syndrome without diarrhea: Secondary | ICD-10-CM | POA: Diagnosis present

## 2020-04-15 DIAGNOSIS — Z993 Dependence on wheelchair: Secondary | ICD-10-CM

## 2020-04-15 DIAGNOSIS — E162 Hypoglycemia, unspecified: Secondary | ICD-10-CM | POA: Diagnosis present

## 2020-04-15 DIAGNOSIS — F411 Generalized anxiety disorder: Secondary | ICD-10-CM | POA: Diagnosis present

## 2020-04-15 DIAGNOSIS — W19XXXA Unspecified fall, initial encounter: Secondary | ICD-10-CM | POA: Diagnosis not present

## 2020-04-15 DIAGNOSIS — Z887 Allergy status to serum and vaccine status: Secondary | ICD-10-CM

## 2020-04-15 DIAGNOSIS — W050XXA Fall from non-moving wheelchair, initial encounter: Secondary | ICD-10-CM | POA: Diagnosis present

## 2020-04-15 DIAGNOSIS — N390 Urinary tract infection, site not specified: Principal | ICD-10-CM | POA: Diagnosis present

## 2020-04-15 DIAGNOSIS — J309 Allergic rhinitis, unspecified: Secondary | ICD-10-CM | POA: Diagnosis present

## 2020-04-15 DIAGNOSIS — R4182 Altered mental status, unspecified: Secondary | ICD-10-CM

## 2020-04-15 DIAGNOSIS — Z90710 Acquired absence of both cervix and uterus: Secondary | ICD-10-CM

## 2020-04-15 DIAGNOSIS — S060X0A Concussion without loss of consciousness, initial encounter: Secondary | ICD-10-CM | POA: Diagnosis present

## 2020-04-15 DIAGNOSIS — F0391 Unspecified dementia with behavioral disturbance: Secondary | ICD-10-CM

## 2020-04-15 DIAGNOSIS — I10 Essential (primary) hypertension: Secondary | ICD-10-CM | POA: Diagnosis present

## 2020-04-15 DIAGNOSIS — Z79891 Long term (current) use of opiate analgesic: Secondary | ICD-10-CM

## 2020-04-15 DIAGNOSIS — Z881 Allergy status to other antibiotic agents status: Secondary | ICD-10-CM

## 2020-04-15 DIAGNOSIS — Z79890 Hormone replacement therapy (postmenopausal): Secondary | ICD-10-CM

## 2020-04-15 DIAGNOSIS — Z8673 Personal history of transient ischemic attack (TIA), and cerebral infarction without residual deficits: Secondary | ICD-10-CM

## 2020-04-15 DIAGNOSIS — Z8249 Family history of ischemic heart disease and other diseases of the circulatory system: Secondary | ICD-10-CM

## 2020-04-15 DIAGNOSIS — Z6826 Body mass index (BMI) 26.0-26.9, adult: Secondary | ICD-10-CM

## 2020-04-15 DIAGNOSIS — E43 Unspecified severe protein-calorie malnutrition: Secondary | ICD-10-CM | POA: Diagnosis present

## 2020-04-15 DIAGNOSIS — Z885 Allergy status to narcotic agent status: Secondary | ICD-10-CM

## 2020-04-15 DIAGNOSIS — F0280 Dementia in other diseases classified elsewhere without behavioral disturbance: Secondary | ICD-10-CM | POA: Diagnosis present

## 2020-04-15 DIAGNOSIS — R680 Hypothermia, not associated with low environmental temperature: Secondary | ICD-10-CM | POA: Diagnosis present

## 2020-04-15 DIAGNOSIS — F0392 Unspecified dementia, unspecified severity, with psychotic disturbance: Secondary | ICD-10-CM | POA: Diagnosis present

## 2020-04-15 DIAGNOSIS — Z9109 Other allergy status, other than to drugs and biological substances: Secondary | ICD-10-CM

## 2020-04-15 DIAGNOSIS — E039 Hypothyroidism, unspecified: Secondary | ICD-10-CM | POA: Diagnosis present

## 2020-04-15 DIAGNOSIS — G309 Alzheimer's disease, unspecified: Secondary | ICD-10-CM | POA: Diagnosis present

## 2020-04-15 LAB — COMPREHENSIVE METABOLIC PANEL
ALT: 13 U/L (ref 0–44)
AST: 19 U/L (ref 15–41)
Albumin: 2.9 g/dL — ABNORMAL LOW (ref 3.5–5.0)
Alkaline Phosphatase: 63 U/L (ref 38–126)
Anion gap: 8 (ref 5–15)
BUN: 13 mg/dL (ref 8–23)
CO2: 26 mmol/L (ref 22–32)
Calcium: 9.2 mg/dL (ref 8.9–10.3)
Chloride: 109 mmol/L (ref 98–111)
Creatinine, Ser: 0.82 mg/dL (ref 0.44–1.00)
GFR, Estimated: >60 mL/min (ref >60)
Glucose, Bld: 76 mg/dL (ref 70–99)
Potassium: 4.5 mmol/L (ref 3.5–5.1)
Sodium: 143 mmol/L (ref 135–145)
Total Bilirubin: 0.5 mg/dL (ref 0.3–1.2)
Total Protein: 6.5 g/dL (ref 6.5–8.1)

## 2020-04-15 LAB — URINALYSIS, ROUTINE W REFLEX MICROSCOPIC
Bacteria, UA: NONE SEEN
Bilirubin Urine: NEGATIVE
Glucose, UA: NEGATIVE mg/dL
Hgb urine dipstick: NEGATIVE
Ketones, ur: NEGATIVE mg/dL
Nitrite: NEGATIVE
Protein, ur: NEGATIVE mg/dL
Specific Gravity, Urine: 1.011 (ref 1.005–1.030)
pH: 5 (ref 5.0–8.0)

## 2020-04-15 LAB — CBC WITH DIFFERENTIAL/PLATELET
Abs Immature Granulocytes: 0.04 10*3/uL (ref 0.00–0.07)
Basophils Absolute: 0.1 10*3/uL (ref 0.0–0.1)
Basophils Relative: 1 %
Eosinophils Absolute: 0.1 10*3/uL (ref 0.0–0.5)
Eosinophils Relative: 3 %
HCT: 38.6 % (ref 36.0–46.0)
Hemoglobin: 11.5 g/dL — ABNORMAL LOW (ref 12.0–15.0)
Immature Granulocytes: 1 %
Lymphocytes Relative: 40 %
Lymphs Abs: 1.8 10*3/uL (ref 0.7–4.0)
MCH: 30.1 pg (ref 26.0–34.0)
MCHC: 29.8 g/dL — ABNORMAL LOW (ref 30.0–36.0)
MCV: 101 fL — ABNORMAL HIGH (ref 80.0–100.0)
Monocytes Absolute: 0.6 10*3/uL (ref 0.1–1.0)
Monocytes Relative: 14 %
Neutro Abs: 1.8 10*3/uL (ref 1.7–7.7)
Neutrophils Relative %: 41 %
Platelets: 238 10*3/uL (ref 150–400)
RBC: 3.82 MIL/uL — ABNORMAL LOW (ref 3.87–5.11)
RDW: 13.7 % (ref 11.5–15.5)
WBC: 4.4 10*3/uL (ref 4.0–10.5)
nRBC: 0 % (ref 0.0–0.2)

## 2020-04-15 LAB — TSH: TSH: 3.118 u[IU]/mL (ref 0.350–4.500)

## 2020-04-15 LAB — CBG MONITORING, ED
Glucose-Capillary: 73 mg/dL (ref 70–99)
Glucose-Capillary: 79 mg/dL (ref 70–99)

## 2020-04-15 LAB — RESPIRATORY PANEL BY RT PCR (FLU A&B, COVID)
Influenza A by PCR: NEGATIVE
Influenza B by PCR: NEGATIVE
SARS Coronavirus 2 by RT PCR: NEGATIVE

## 2020-04-15 LAB — MRSA PCR SCREENING: MRSA by PCR: NEGATIVE

## 2020-04-15 LAB — AMMONIA: Ammonia: 31 umol/L (ref 9–35)

## 2020-04-15 LAB — LACTIC ACID, PLASMA
Lactic Acid, Venous: 1.9 mmol/L (ref 0.5–1.9)
Lactic Acid, Venous: 1 mmol/L (ref 0.5–1.9)

## 2020-04-15 MED ORDER — HYDRALAZINE HCL 20 MG/ML IJ SOLN: 5.0000 mg | INTRAMUSCULAR | Status: DC | PRN

## 2020-04-15 MED ORDER — ACETAMINOPHEN 325 MG PO TABS: 650.0000 mg | ORAL_TABLET | Freq: Four times a day (QID) | ORAL | Status: DC | PRN

## 2020-04-15 MED ORDER — ACETAMINOPHEN 650 MG RE SUPP: 650.0000 mg | Freq: Four times a day (QID) | RECTAL | Status: DC | PRN

## 2020-04-15 MED ORDER — ONDANSETRON HCL 4 MG PO TABS: 4.0000 mg | ORAL_TABLET | Freq: Four times a day (QID) | ORAL | Status: DC | PRN

## 2020-04-15 MED ORDER — LACTATED RINGERS IV SOLN: INTRAVENOUS | Status: DC

## 2020-04-15 MED ORDER — ONDANSETRON HCL 4 MG/2ML IJ SOLN: 4.0000 mg | Freq: Four times a day (QID) | INTRAMUSCULAR | Status: DC | PRN

## 2020-04-15 MED ORDER — ENOXAPARIN SODIUM 30 MG/0.3ML ~~LOC~~ SOLN
30.0000 mg | SUBCUTANEOUS | Status: DC
  Administered 2020-04-15 – 2020-04-17 (×2): 30 mg via SUBCUTANEOUS
  Filled 2020-04-15 (×3): qty 0.3

## 2020-04-15 MED ORDER — SODIUM CHLORIDE 0.9% FLUSH
3.0000 mL | Freq: Two times a day (BID) | INTRAVENOUS | Status: DC
  Administered 2020-04-15 – 2020-04-17 (×3): 3 mL via INTRAVENOUS

## 2020-04-15 MED ORDER — MORPHINE SULFATE (PF) 2 MG/ML IV SOLN: 2.0000 mg | INTRAVENOUS | Status: DC | PRN

## 2020-04-15 MED ORDER — ALBUTEROL SULFATE (2.5 MG/3ML) 0.083% IN NEBU: 2.5000 mg | INHALATION_SOLUTION | RESPIRATORY_TRACT | Status: DC | PRN

## 2020-04-15 MED ORDER — SODIUM CHLORIDE 0.9 % IV SOLN: Freq: Once | INTRAVENOUS | Status: AC

## 2020-04-15 NOTE — Progress Notes (Addendum)
Brief Palliative Care Progress Note:  PMT consult received and chart reviewed.   Noted patient was already enrolled with [facility]) services. Spoke with [place] hospice liaison - she verified patient was receiving home hospice services at [facility]. She confirmed patient is GIP with no symptom management needs at this time. Chrislyn explained she has already had [place] discussion with the patient's family today - family wishes to pursue full scope medical treatment at this time and remain DNR/DNI. [facility] will continue to follow with this patient under GIP status.   PMT will continue to follow peripherally. If there are any imminent needs [facility] will call the service directly.  Thank you for allowing PMT to assist in the care of this patient.  Livie Vanderhoof M. Tamala Julian [facility] Palliative Medicine Team Team Phone: [phone]  NO CHARGE

## 2020-04-15 NOTE — ED Notes (Signed)
Dr. Yates at bedside.  

## 2020-04-15 NOTE — Progress Notes (Signed)
[facility] ED 17[facility])  Hospitalized Hospice Patient   Ms Kathryn Whitaker is a current hospice patient who resides at [facility].  She was admitted onto hospice service on 08/20/19 with a terminal diagnosis of cerebrovascular disease. Pt had unwitnessed fall with contusion noted to forehead.  Per  RN CM notes pt is alert and oriented to self at baseline.  Haldol 0.5mg  4x/day was added in September for increased agitation/combativeness with paranoia/hallucinations, per Dr. Gildardo Cranker, [place] reported pt less alert than baseline s/p fall.  [facility] was notified prior to EMS being activated. Pt transported by EMS to [facility] ED for evaluation and found to have altered mental status. Per Dr. Tomasa Hosteller, [facility] MD, this is a related hospital admission.  Visited with patient and daughter Kathryn Whitaker at bedside. Pt did not rouse to voice or gentle touch but did resist strongly having eyelid raised.  NAD noted at this time; respirations even/unlabored.  Hematoma noted to mid forehead. Pt on a Bair hugger; rectal temp charted at 96.25F.  Workup largely reassuring.  Kathryn Whitaker reports desire to treat the treatable, reports her mother was conversant and in good spirits when they last visited.  V/S: 96.3 rectal, 127/64, 60 HR, 6 RR, 100% RA Diagnostics:  Chest 1 view: FINDINGS: Evaluation is limited by patient's kyphotic spinal curvature and rotation. Cardiomediastinal contours are grossly stable with tortuosity of the thoracic aorta. No definite airspace opacity. No evidence of pleural effusion or pneumothorax. Bones are demineralized. No apparent fractures.   IMPRESSION: Limited exam.  No evidence of a acute cardiopulmonary abnormality.   CT Head: IMPRESSION: 1. No acute intracranial findings. 2. Chronic microvascular ischemic change and cerebral volume loss.   IVs/PRNs: LR 8mL/hr  Labs: Albumin 2.9, Hgb 11.5     Problem List: Fall with AMS -Patient is in SNF and is non-ambulatory but somehow managed  to fall out of her wheelchair today -She has a small midline forehead hematoma without other apparent trauma -She now has encephalopathy as evidenced by her obtunded/unresponsiveness -While the patient does have underlying dementia, this is a change compared to her usual baseline mental status -Evaluation thus far unremarkable, although UA is still pending -Negative head CT -Her acute metabolic encephalopathy is suspected to be due to concussion without other apparent etiology after head trauma -She is mildly hypothermic, but there is no other evidence of sepsis/infection at this time -Based on unremarkable evaluation, will observe for now with IVF hydration and telemetry monitoring in progressive care unit -She is already enrolled in hospice (although her daughter provides a somewhat alternate explanation for what hospice is); will request palliative care consult -If mental status is not improving by tomorrow, would suggest consideration of transition to comfort measures -NPO for now given significantly AMS -Hold Oxy IR for now  D/C plan: discharge back to [facility] with [facility] hospice support. **Please use [facility] for transport, as [facility] contracts with them.** GOC: treat the treatable, DNR Family: visited with Kathryn Whitaker at bedside IDT: hospice team updated

## 2020-04-15 NOTE — ED Triage Notes (Addendum)
Pt arrives from [facility] due to falling out of her wheelchair. Per ems, the patient had an unwitnessed fall and EMS was called. Pt is not on blood thinners. She is a&ox3-4 at baseline. Patient arrives on 2L [place], and towel roll for neck stabilization. Patient not verbally responding on arrival, withdraws from pain.

## 2020-04-15 NOTE — H&P (Signed)
History and Physical    Kathryn Whitaker GXQ:[id number] DOB: 1919-08-19 DOA: 04/15/2020  PCP: Hoyt Koch, MD Consultants:  None Patient coming from: [facility] SNF; NOK: Daughter, Rachell Cipro, [phone]  Chief Complaint: AMS  HPI: Kathryn Whitaker is a 86 y.o. female with medical history significant of malnutrition; CVA; HTN; HLD; and dementia presenting with AMS.  The facility called her son and reported that she fell trying to get out of the wheelchair.  She is nonambulatory, ?maybe she leaned forward.  She hit her head.  Her usual self is alert, speaks generally with normal conversation, "usually not like this."  Her daughter last saw her Sunday and she was very talkative.  In general, would prefer comfort and whatever possible to help her get better.  She is in hospice "but not end of life hospice" - for special bed, wheelchair, special nurse, "it's interactive."  She has been involved with hospice for over a year.   ED Course:  Usually alert and oriented.  Fell at [facility], less responsive since - withdraws to pain only.  Similar presentation 2 months ago, this time without cause.  UA pending - unable to get I/O cath.  Bladder scan pending, NS given.  Unremarkable evaluation.  Spoke with hospice nurse and family, she "is not going to do well."   Review of Systems: Unable to perform   Ambulatory Status:  Nonambulatory  COVID Vaccine Status:   Complete  Past Medical History:  Diagnosis Date  . Acute asthmatic bronchitis   . Allergic rhinitis   . Alzheimer's dementia ([place])   . Anxiety   . Chronic constipation   . Diverticulosis of colon   . DJD (degenerative joint disease)   . GERD (gastroesophageal reflux disease)   . History of colonic polyps   . Hypercholesteremia   . Hypertension   . IBS (irritable bowel syndrome)   . Lacunar infarction ([place])   . Lumbar back pain   . Malnutrition ([place])   . UTI (lower urinary tract infection)   . Vitamin D deficiency      Past Surgical History:  Procedure Laterality Date  . ABDOMINAL HYSTERECTOMY    . FEMUR IM NAIL Left 01/13/2017   Procedure: TROCHANTERIC INTRAMEDULLARY (IM) NAIL FEMORAL;  Surgeon: Renette Butters, MD;  Location: [place];  Service: Orthopedics;  Laterality: Left;    Social History   Socioeconomic History  . Marital status: Widowed    Spouse name: Not on file  . Number of children: 6  . Years of education: Not on file  . Highest education level: Not on file  Occupational History  . Occupation: retired  Tobacco Use  . Smoking status: Never Smoker  . Smokeless tobacco: Never Used  Vaping Use  . Vaping Use: Never used  Substance and Sexual Activity  . Alcohol use: No    Alcohol/week: 0.0 standard drinks  . Drug use: No  . Sexual activity: Not on file  Other Topics Concern  . Not on file  Social History Narrative  . Not on file   Social Determinants of Health   Financial Resource Strain:   . Difficulty of Paying Living Expenses: Not on file  Food Insecurity:   . Worried About Charity fundraiser in the Last Year: Not on file  . Ran Out of Food in the Last Year: Not on file  Transportation Needs:   . Lack of Transportation (Medical): Not on file  . Lack of Transportation (Non-Medical): Not on  file  Physical Activity:   . Days of Exercise per Week: Not on file  . Minutes of Exercise per Session: Not on file  Stress:   . Feeling of Stress : Not on file  Social Connections:   . Frequency of Communication with Friends and Family: Not on file  . Frequency of Social Gatherings with Friends and Family: Not on file  . Attends Religious Services: Not on file  . Active Member of Clubs or Organizations: Not on file  . Attends Archivist Meetings: Not on file  . Marital Status: Not on file  Intimate Partner Violence:   . Fear of Current or Ex-Partner: Not on file  . Emotionally Abused: Not on file  . Physically Abused: Not on file  . Sexually Abused: Not on file     Allergies  Allergen Reactions  . Demerol [Meperidine]     Pt states she can't remember type of reaction  . Influenza Vaccines     Pt can't remember reaction but states allergic and has not had in 25 years  . Amoxicillin     PER MAR  . Clarithromycin Other (See Comments)    Unknown  . Morphine And Related Dermatitis  . Peanuts [Peanut Oil]     Pt reports she is allergic to peanuts, but was asking to eat peanut butter. Pt unsure of her reaction.   [name] [Amoxicill-Clarithro-Lansopraz] Hives    .Marland KitchenHas patient had a PCN reaction causing immediate rash, facial/tongue/throat swelling, SOB or lightheadedness with hypotension: unknown Has patient had a PCN reaction causing severe rash involving mucus membranes or skin necrosis: unknown Has patient had a PCN reaction that required hospitalization unknown Has patient had a PCN reaction occurring within the last 10 years: unknown If all of the above answers are "NO", then may proceed with Cephalosporin use.   . Telithromycin Hives    REACTION: pt states HIVES....    Family History  Problem Relation Age of Onset  . Hypertension Other     Prior to Admission medications   Medication Sig Start Date End Date Taking? Authorizing Provider  acetaminophen (TYLENOL) 500 MG tablet Take 500 mg by mouth every 4 (four) hours as needed for mild pain, fever or headache.   Yes [provider]  alum & mag hydroxide-simeth (MINTOX REGULAR STRENGTH) 161-096-04 MG/5ML suspension Take 30 mLs by mouth every 6 (six) hours as needed for indigestion or heartburn.   Yes [provider]  Chloroxylenol-Zinc Oxide (BAZA EX) Apply 1 application topically See admin instructions. Apply to buttocks bid and after each incontinence episode   Yes [provider]  guaifenesin (ROBITUSSIN) 100 MG/5ML syrup Take 200 mg by mouth every 6 (six) hours as needed for cough.   Yes [provider]  haloperidol (HALDOL) 2 MG/ML solution Take 0.5  mg by mouth in the morning, at noon, in the evening, and at bedtime.    Yes [provider]  lactulose (CHRONULAC) 10 GM/15ML solution Take 30 g by mouth 2 (two) times daily as needed for mild constipation.   Yes [provider]  lamoTRIgine (LAMICTAL) 25 MG tablet Take 3 tablets (75 mg total) by mouth 2 (two) times daily. Patient taking differently: Take 50 mg by mouth 2 (two) times daily.  04/08/17  Yes Ethelene Hal, NP  levothyroxine (SYNTHROID, LEVOTHROID) 50 MCG tablet TAKE 1 TABLET (50 MCG TOTAL) BY MOUTH DAILY BEFORE BREAKFAST. 01/06/17  Yes Golden Circle, FNP  loperamide (IMODIUM) 2 MG capsule  Take 2 mg by mouth as needed for diarrhea or loose stools.   Yes [provider]  magnesium hydroxide (MILK OF MAGNESIA) 400 MG/5ML suspension Take 30 mLs by mouth at bedtime as needed for mild constipation.   Yes [provider]  neomycin-bacitracin-polymyxin (NEOSPORIN) [phone] ointment Apply 1 application topically daily as needed (skin abrasions).   Yes [provider]  oxycodone (OXY-IR) 5 MG capsule Take 5 mg by mouth every 4 (four) hours as needed for pain.    Yes [provider]  PRESCRIPTION MEDICATION Take 1 each by mouth in the morning, at noon, and at bedtime. Mighty shake   Yes [provider]  senna (SENOKOT) 8.6 MG TABS tablet Take 2 tablets by mouth 2 (two) times daily.    Yes [provider]  trolamine salicylate (ASPERCREME) 10 % cream Apply 1 application topically in the morning and at bedtime. Right knee   Yes [provider]  bisacodyl (DULCOLAX) 10 MG suppository Place 1 suppository (10 mg total) rectally daily as needed for moderate constipation. Patient not taking: Reported on 04/15/2020 01/31/20   Debbe Odea, MD  polyethylene glycol (MIRALAX / GLYCOLAX) 17 g packet Take 17 g by mouth 2 (two) times daily. Patient not taking: Reported on 04/15/2020 01/31/20   Debbe Odea, MD  sodium  phosphate (FLEET) 7-19 GM/118ML ENEM Place 133 mLs (1 enema total) rectally daily as needed for severe constipation. Patient not taking: Reported on 04/15/2020 01/31/20   Debbe Odea, MD  traMADol (ULTRAM) 50 MG tablet Take 50 mg by mouth 2 (two) times daily. Patient not taking: Reported on 04/15/2020    [provider]    Physical Exam: Vitals:   04/15/20 1400 04/15/20 1424 04/15/20 1500 04/15/20 1530  BP: 117/67  130/74 (!) 125/55  Pulse: (!) 55  71 70  Resp: 15  12 18   Temp:      TempSrc:      SpO2: 99% 99% 99% 98%     . General:  Appears calm and comfortable and is unresponsive except to pain/agitation; midline forehead hematoma, mild . Eyes:  PERRL, normal lids, iris . ENT:  grossly normal hearing, lips & tongue, mmm . Neck:  no LAD, masses or thyromegaly . Cardiovascular:  RRR, no r/g, 3-6/4 systolic murmur. No LE edema.  Marland Kitchen Respiratory:   CTA bilaterally with no wheezes/rales/rhonchi.  Normal respiratory effort. . Abdomen:  soft, NT, ND, NABS . Skin:  no rash or induration seen on limited exam . Musculoskeletal:  Decreased tone BUE/BLE, good ROM, no bony abnormality; +temporal wasting . Lower extremity:  No LE edema.  Limited foot exam with no ulcerations.  2+ distal pulses. Marland Kitchen Psychiatric:  Unresponsive other than to pain/agitation . Neurologic:  Unable to perform    Radiological Exams on Admission: Independently reviewed - see discussion in A/P where applicable  CT Head Wo Contrast  Result Date: 04/15/2020 CLINICAL DATA:  Altered mental status EXAM: CT HEAD WITHOUT CONTRAST TECHNIQUE: Contiguous axial images were obtained from the base of the skull through the vertex without intravenous contrast. COMPARISON:  04/27/2019, 06/03/2015 FINDINGS: Brain: No evidence of acute infarction, hemorrhage, hydrocephalus, extra-axial collection or mass lesion/mass effect. Subtle areas of hyperdensity within the left basal ganglia are unchanged compared to multiple prior  studies and likely represent small areas of calcification. Small chronic lacunar infarcts in the right basal ganglia and right cerebellum. Moderate low-density changes within the periventricular and subcortical white matter compatible with chronic microvascular ischemic change. Mild-moderate diffuse  cerebral volume loss. Vascular: Atherosclerotic calcifications involving the large vessels of the skull base. No unexpected hyperdense vessel. Skull: Normal. Negative for fracture or focal lesion. Sinuses/Orbits: No acute finding. Other: None. IMPRESSION: 1. No acute intracranial findings. 2. Chronic microvascular ischemic change and cerebral volume loss. Electronically Signed   By: Davina Poke D.O.   On: 04/15/2020 13:43   DG Chest Portable 1 View  Result Date: 04/15/2020 CLINICAL DATA:  Altered mental status EXAM: PORTABLE CHEST 1 VIEW COMPARISON:  04/27/2019 FINDINGS: Evaluation is limited by patient's kyphotic spinal curvature and rotation. Cardiomediastinal contours are grossly stable with tortuosity of the thoracic aorta. No definite airspace opacity. No evidence of pleural effusion or pneumothorax. Bones are demineralized. No apparent fractures. IMPRESSION: Limited exam.  No evidence of a acute cardiopulmonary abnormality. Electronically Signed   By: Davina Poke D.O.   On: 04/15/2020 15:24    EKG: Independently reviewed.  NSR with rate 65; significant artifact, NSCSLT   Labs on Admission: I have personally reviewed the available labs and imaging studies at the time of the admission.  Pertinent labs:   Albumin 2.9 Lactate 1.9, 1.0 WBC 4.4 Hgb 11.5 TSH 3.118   Assessment/Plan Principal Problem:   Fall at home, initial encounter Active Problems:   Hx of completed stroke   Dyslipidemia   HTN (hypertension)   Generalized anxiety disorder   COPD (chronic obstructive pulmonary disease) (HCC)   Protein-calorie malnutrition, severe (HCC)   Hypothyroidism   Dementia with psychosis  ([place])   Fall with AMS -Patient is in SNF and is non-ambulatory but somehow managed to fall out of her wheelchair today -She has a small midline forehead hematoma without other apparent trauma -She now has encephalopathy as evidenced by her obtunded/unresponsiveness -While the patient does have underlying dementia, this is a change compared to her usual baseline mental status -Evaluation thus far unremarkable, although UA is still pending -Negative head CT -Her acute metabolic encephalopathy is suspected to be due to concussion without other apparent etiology after head trauma -She is mildly hypothermic, but there is no other evidence of sepsis/infection at this time -Based on unremarkable evaluation, will observe for now with IVF hydration and telemetry monitoring in progressive care unit -She is already enrolled in hospice (although her daughter provides a somewhat alternate explanation for what hospice is); will request palliative care consult -If mental status is not improving by tomorrow, would suggest consideration of transition to comfort measures -NPO for now given significantly AMS -Hold Oxy IR for now  Malnutrition -The patient's BMI is 26.2 -The patient has at least 2 indicators for malnutrition (insufficient energy intake, loss of muscle mass, loss of subcutaneous fat, diminished functional status).  -This is likely due to starvation related.   -Will obtain a nutrition consult for further recommendations.  HTN -She does not appear to be taking medications for this issue at this time  HLD -She does not appear to be taking medications for this issue at this time  COPD -She does not appear to be taking medications for this issue at this time  Dementia with psychosis/anxiety -Hold Haldol for now given obtundation but she is likely to benefit from this medication when she is more alert/agitated -It is not clear why she is taking Lamictal - will hold for now and level is  pending  Hypothyroidism -Check TSH -Continue Synthroid at current dose when able to take PO    Note: This patient has been tested and is pending for the novel coronavirus COVID-19.  The patient has been fully vaccinated against COVID-19.    DVT prophylaxis:   Lovenox  Code Status:  DNR - confirmed with bedside paperwork, daughter Family Communication: Daughter was present throughout evaluation Disposition Plan:  The patient is from: SNF  Anticipated d/c is to: SNF  Anticipated d/c date will depend on clinical response to treatment, but possibly as early as tomorrow if she has excellent response to treatment  Patient is currently: acutely ill Consults called: Palliative care; nutrition; TOC team Admission status:  It is my clinical opinion that referral for OBSERVATION is reasonable and necessary in this patient based on the above information provided. The aforementioned taken together are felt to place the patient at high risk for further clinical deterioration. However it is anticipated that the patient may be medically stable for discharge from the hospital within 24 to 48 hours.    Karmen Bongo MD [facility]   How to contact the [facility] Attending or Consulting provider [place] or covering provider during after hours [place], for this patient?  1. Check the care team in [facility] and look for a) attending/consulting TRH provider listed and b) the [facility] team listed 2. Log into [email or web address] and use [place]'s universal password to access. If you do not have the password, please contact the hospital operator. 3. Locate the [facility] provider you are looking for under [facility] and page to a number that you can be directly reached. 4. If you still have difficulty reaching the provider, please page the [facility] (Director on Call) for the Hospitalists listed on amion for assistance.   04/15/2020, 3:49 PM

## 2020-04-15 NOTE — Progress Notes (Signed)
   04/15/20 1913  Assess: MEWS Score  Temp 98.4 F (36.9 C)  BP (!) 95/43  Pulse Rate 63  ECG Heart Rate 64  Resp 19  SpO2 98 %  O2 Device Room Air  Assess: MEWS Score  MEWS Temp 0  MEWS Systolic 1  MEWS Pulse 0  MEWS RR 0  MEWS LOC 2  MEWS Score 3  MEWS Score Color Yellow  Assess: if the MEWS score is Yellow or Red  Were vital signs taken at a resting state? Yes  Focused Assessment No change from prior assessment  Early Detection of Sepsis Score *See Row Information* Low  MEWS guidelines implemented *See Row Information* Yes  Treat  MEWS Interventions Escalated (See documentation below)  Pain Scale PAINAD  Pain Score Asleep  Breathing 0  Negative Vocalization 0  Facial Expression 0  Body Language 0  Consolability 0  PAINAD Score 0  Take Vital Signs  Increase Vital Sign Frequency  Yellow: Q 2hr X 2 then Q 4hr X 2, if remains yellow, continue Q 4hrs  Escalate  MEWS: Escalate Yellow: discuss with charge nurse/RN and consider discussing with provider and RRT  Notify: Charge Nurse/RN  Name of Charge Nurse/RN Notified Shanel RN  Date Charge Nurse/RN Notified 04/15/20  Time Charge Nurse/RN Notified 2019

## 2020-04-15 NOTE — ED Provider Notes (Signed)
[facility] EMERGENCY DEPARTMENT Provider Note   CSN: [id number] Arrival date & time: 04/15/20  1238     History Chief Complaint  Patient presents with  . Altered Mental Status    Kathryn Whitaker is a 86 y.o. female with history of dementia with psychosis, hypothyroidism, protein calorie malnutrition, wheelchair-bound, brought to the ED from living facility [facility] memory unit by EMS for evaluation of decreased mentation.  I obtained additional information from EMS who state patient somehow fell out of her chair.  This was an unwitnessed fall but staff heard her fall.  Patient has contusion on the forehead.  Per staff, patient is not responding as usual.  Reportedly, patient is alert and oriented x4 per EMS.  Patient is now only responsive/withdrawing to painful stimuli.  No oral anticoagulants.  No other available information per EMS.  Patient keeps her eyes closed during exam and only withdraws to pain.    HPI     Past Medical History:  Diagnosis Date  . Acute asthmatic bronchitis   . Allergic rhinitis   . Alzheimer's dementia ([place])   . Anxiety   . Chronic constipation   . Diverticulosis of colon   . DJD (degenerative joint disease)   . GERD (gastroesophageal reflux disease)   . History of colonic polyps   . Hypercholesteremia   . Hypertension   . IBS (irritable bowel syndrome)   . Lacunar infarction ([place])   . Lumbar back pain   . Malnutrition ([place])   . UTI (lower urinary tract infection)   . Vitamin D deficiency     Patient Active Problem List   Diagnosis Date Noted  . Acute urinary retention 01/30/2020  . Dehydration 01/30/2020  . UTI (urinary tract infection) 01/29/2020  . Fecal impaction ([place]) 01/29/2020  . Acute metabolic encephalopathy 68/34/1962  . AMS (altered mental status) 04/27/2019  . Hallucinations   . Acute blood loss anemia 01/15/2017  . Hip fracture, left ([place]) 01/12/2017  . Hip fracture ([place]) 01/12/2017  . S/p left  hip fracture 01/12/2017  . Dementia with psychosis ([place]) 07/26/2016  . Dysuria 08/10/2015  . Hypothyroidism 06/06/2015  . Protein-calorie malnutrition, severe ([place]) 06/26/2014  . Constipation 12/17/2013  . COPD (chronic obstructive pulmonary disease) ([place]) 06/03/2013  . IBS (irritable bowel syndrome) 12/30/2012  . Generalized anxiety disorder 12/30/2012  . Dyslipidemia 12/12/2012  . HTN (hypertension) 12/12/2012  . Hx of completed stroke 07/11/2009    Past Surgical History:  Procedure Laterality Date  . ABDOMINAL HYSTERECTOMY    . FEMUR IM NAIL Left 01/13/2017   Procedure: TROCHANTERIC INTRAMEDULLARY (IM) NAIL FEMORAL;  Surgeon: Renette Butters, MD;  Location: [facility];  Service: Orthopedics;  Laterality: Left;     OB History   No obstetric history on file.     Family History  Problem Relation Age of Onset  . Hypertension Other     Social History   Tobacco Use  . Smoking status: Never Smoker  . Smokeless tobacco: Never Used  Vaping Use  . Vaping Use: Never used  Substance Use Topics  . Alcohol use: No    Alcohol/week: 0.0 standard drinks  . Drug use: No    Home Medications Prior to Admission medications   Medication Sig Start Date End Date Taking? Authorizing Provider  acetaminophen (TYLENOL) 500 MG tablet Take 500 mg by mouth every 4 (four) hours as needed for mild pain, fever or headache.   Yes [provider]  alum & mag hydroxide-simeth (  MINTOX REGULAR STRENGTH) 200-200-20 MG/5ML suspension Take 30 mLs by mouth every 6 (six) hours as needed for indigestion or heartburn.   Yes [provider]  Chloroxylenol-Zinc Oxide (BAZA EX) Apply 1 application topically See admin instructions. Apply to buttocks bid and after each incontinence episode   Yes [provider]  guaifenesin (ROBITUSSIN) 100 MG/5ML syrup Take 200 mg by mouth every 6 (six) hours as needed for cough.   Yes [provider]  haloperidol (HALDOL) 2 MG/ML solution Take  0.5 mg by mouth in the morning, at noon, in the evening, and at bedtime.    Yes [provider]  lactulose (CHRONULAC) 10 GM/15ML solution Take 30 g by mouth 2 (two) times daily as needed for mild constipation.   Yes [provider]  lamoTRIgine (LAMICTAL) 25 MG tablet Take 3 tablets (75 mg total) by mouth 2 (two) times daily. Patient taking differently: Take 50 mg by mouth 2 (two) times daily.  04/08/17  Yes Ethelene Hal, NP  levothyroxine (SYNTHROID, LEVOTHROID) 50 MCG tablet TAKE 1 TABLET (50 MCG TOTAL) BY MOUTH DAILY BEFORE BREAKFAST. 01/06/17  Yes Golden Circle, FNP  loperamide (IMODIUM) 2 MG capsule Take 2 mg by mouth as needed for diarrhea or loose stools.   Yes [provider]  magnesium hydroxide (MILK OF MAGNESIA) 400 MG/5ML suspension Take 30 mLs by mouth at bedtime as needed for mild constipation.   Yes [provider]  neomycin-bacitracin-polymyxin (NEOSPORIN) [id number] ointment Apply 1 application topically daily as needed (skin abrasions).   Yes [provider]  oxycodone (OXY-IR) 5 MG capsule Take 5 mg by mouth every 4 (four) hours as needed for pain.    Yes [provider]  PRESCRIPTION MEDICATION Take 1 each by mouth in the morning, at noon, and at bedtime. Mighty shake   Yes [provider]  senna (SENOKOT) 8.6 MG TABS tablet Take 2 tablets by mouth 2 (two) times daily.    Yes [provider]  trolamine salicylate (ASPERCREME) 10 % cream Apply 1 application topically in the morning and at bedtime. Right knee   Yes [provider]  bisacodyl (DULCOLAX) 10 MG suppository Place 1 suppository (10 mg total) rectally daily as needed for moderate constipation. Patient not taking: Reported on 04/15/2020 01/31/20   Debbe Odea, MD  polyethylene glycol (MIRALAX / GLYCOLAX) 17 g packet Take 17 g by mouth 2 (two) times daily. Patient not taking: Reported on 04/15/2020 01/31/20   Debbe Odea, MD    sodium phosphate (FLEET) 7-19 GM/118ML ENEM Place 133 mLs (1 enema total) rectally daily as needed for severe constipation. Patient not taking: Reported on 04/15/2020 01/31/20   Debbe Odea, MD  traMADol (ULTRAM) 50 MG tablet Take 50 mg by mouth 2 (two) times daily. Patient not taking: Reported on 04/15/2020    [provider]    Allergies    Demerol [meperidine], Influenza vaccines, Amoxicillin, Clarithromycin, Morphine and related, Peanuts [peanut oil], Prevpac [amoxicill-clarithro-lansopraz], and Telithromycin  Review of Systems   Review of Systems  Unable to perform ROS: Mental status change  All other systems reviewed and are negative.   Physical Exam Updated Vital Signs BP 117/67   Pulse (!) 55   Temp (!) 96.3 F (35.7 C) (Rectal)   Resp 15   SpO2 99%   Physical Exam Vitals and nursing note reviewed.  Constitutional:      General: She is not in acute distress.    Appearance: She is well-developed.  Comments: Keeps eyes closed during exam  HENT:     Head: Normocephalic and atraumatic.     Right Ear: External ear normal.     Left Ear: External ear normal.     Nose: Nose normal.     Mouth/Throat:     Comments: Unable to look inside patient's mouth. She is clenching teeth resisting exam  Eyes:     Conjunctiva/sclera: Conjunctivae normal.     Comments: Patient keeps eyes closed, clenching/resisting pulling eyelids apart.  PERRL 2-3 mm round, reactive. Intermittently opens and blinks during exam.   Neck:     Comments: In soft towel collar  Cardiovascular:     Rate and Rhythm: Normal rate and regular rhythm.     Heart sounds: Normal heart sounds. No murmur heard.   Pulmonary:     Effort: Pulmonary effort is normal.     Breath sounds: Normal breath sounds. No wheezing.  Abdominal:     Palpations: Abdomen is soft.     Tenderness: There is no abdominal tenderness.  Genitourinary:    Comments: Small superficial break down left buttock, no bleeding,  drainage or signs of infection. Incontinent of brown stool. No firm stool ball in rectal vault, liquid brown stool incontinent.  Musculoskeletal:        General: No deformity. Normal range of motion.     Cervical back: Normal range of motion and neck supple.     Comments: Both hands/arms contracted upward, resisting any movement   Skin:    General: Skin is warm and dry.     Capillary Refill: Capillary refill takes less than 2 seconds.  Neurological:     Mental Status: She is disoriented.     Comments: Upper arms/hands contracted and resisting any movement during exam. Keeps eyes closed during exam, intermittently opens them. PERRL bilaterally. Does not move about in bed. Resisting any movement of eye/mouth for exam, repositioning in bed. Withdraws to pain.  Unable to perform full neuro exam.     ED Results / Procedures / Treatments   Labs (all labs ordered are listed, but only abnormal results are displayed) Labs Reviewed  CBC WITH DIFFERENTIAL/PLATELET - Abnormal; Notable for the following components:      Result Value   RBC 3.82 (*)    Hemoglobin 11.5 (*)    MCV 101.0 (*)    MCHC 29.8 (*)    All other components within normal limits  COMPREHENSIVE METABOLIC PANEL - Abnormal; Notable for the following components:   Albumin 2.9 (*)    All other components within normal limits  RESPIRATORY PANEL BY RT PCR (FLU A&B, COVID)  URINE CULTURE  TSH  LACTIC ACID, PLASMA  AMMONIA  LACTIC ACID, PLASMA  URINALYSIS, ROUTINE W REFLEX MICROSCOPIC  LAMOTRIGINE LEVEL  CBG MONITORING, ED  CBG MONITORING, ED    EKG EKG Interpretation  Date/Time:  Saturday April 15 2020 12:50:43 EDT Ventricular Rate:  65 PR Interval:    QRS Duration: 110 QT Interval:  423 QTC Calculation: 440 R Axis:   -64 Text Interpretation: Sinus rhythm Short PR interval Left anterior fascicular block Abnormal R-wave progression, early transition Nonspecific T abnormalities, inferior leads Borderline ST elevation,  anterior leads Artifact in lead(s) I III aVR aVL aVF V1 V6 No significant change since last tracing Confirmed by Fredia Sorrow [phone]) on 04/15/2020 1:25:32 PM   Radiology CT Head Wo Contrast  Result Date: 04/15/2020 CLINICAL DATA:  Altered mental status EXAM: CT HEAD WITHOUT CONTRAST TECHNIQUE: Contiguous axial images were  obtained from the base of the skull through the vertex without intravenous contrast. COMPARISON:  04/27/2019, 06/03/2015 FINDINGS: Brain: No evidence of acute infarction, hemorrhage, hydrocephalus, extra-axial collection or mass lesion/mass effect. Subtle areas of hyperdensity within the left basal ganglia are unchanged compared to multiple prior studies and likely represent small areas of calcification. Small chronic lacunar infarcts in the right basal ganglia and right cerebellum. Moderate low-density changes within the periventricular and subcortical white matter compatible with chronic microvascular ischemic change. Mild-moderate diffuse cerebral volume loss. Vascular: Atherosclerotic calcifications involving the large vessels of the skull base. No unexpected hyperdense vessel. Skull: Normal. Negative for fracture or focal lesion. Sinuses/Orbits: No acute finding. Other: None. IMPRESSION: 1. No acute intracranial findings. 2. Chronic microvascular ischemic change and cerebral volume loss. Electronically Signed   By: Davina Poke D.O.   On: 04/15/2020 13:43    Procedures Procedures (including critical care time)  Medications Ordered in ED Medications  0.9 %  sodium chloride infusion (has no administration in time range)    ED Course  I have reviewed the triage vital signs and the nursing notes.  Pertinent labs & imaging results that were available during my care of the patient were reviewed by me and considered in my medical decision making (see chart for details).  Clinical Course as of Apr 16 1443  Sat Apr 15, 2020  1256 Attempted to call [facility] x 2  no pick up Attempted to call daughter x 2 no pick up, left voicemail  Call placed to [facility]    [CG]  1346 Daughter at bedside. Confirms son is POA.  Confirms patient is DNR and usually alert, awake, talkative and able to hold full conversation.   Daughter is in agreement with current ER plan including lab work, UA, head CT, monitoring and possible admission.    [CG]    Clinical Course User Index [CG] Arlean Hopping   MDM Rules/Calculators/A&P                          EMR, triage nursing notes reviewed to assist with MDM and obtain more history.  I obtain more history directly from EMS, see above.  I have attempted to contact nursing facility, daughter and [facility] care but unsuccessful.  86 year old female presents for decreased mentation and unwitnessed fall with contusion on the forehead.  Hypothermic rectal temp 96.3.  Normal vital signs otherwise.  Responsive to painful stimulus only but resisting any repositioning in bed, IN throat exam.  1350: Daughter not at bedside confirms patient is usually awake, alert and able to have a full conversation and presentation today is much different from her baseline.  Chart review reveals patient admitted August 2021 with similar presentation of decreased responsiveness, fecal impaction, hypoglycemia.  I ordered lab work including ammonia, lamotrigine level, TSH, catheterized urinalysis, lactic acid, CBG.  I ordered imaging studies including head CT, EKG.  Ddx includes occult infection, metabolic encephalopathy, intracranial injury from fall.    0354: ER work-up still pending, CBG 73.  Head CT is nonacute.  EKG with some artifact but no overt abnormalities.  Patient reevaluated and no clinical decline, remains minimally interactive.  1440: no leukocytosis. Normal lactic acid.  Normal CMP. Glucose 76, will recheck CBG now. Normal ammonia, TSH.    No improvement in mentation. Will consult medicine for admission. Etiology of AMS  unclear. Unable to obtain urinalysis.  Will bladder scan.  Pending lamotrigine level, CXR. NS infusion  ordered. Shared with EDP.   Final Clinical Impression(s) / ED Diagnoses Final diagnoses:  Altered mental status, unspecified altered mental status type    Rx / DC Orders ED Discharge Orders    None       Kinnie Feil, PA-C 04/15/20 1444    Fredia Sorrow, MD 04/15/20 [phone]

## 2020-04-16 ENCOUNTER — Other Ambulatory Visit: Payer: Self-pay

## 2020-04-16 DIAGNOSIS — S060X0A Concussion without loss of consciousness, initial encounter: Secondary | ICD-10-CM | POA: Diagnosis present

## 2020-04-16 DIAGNOSIS — S0083XA Contusion of other part of head, initial encounter: Secondary | ICD-10-CM | POA: Diagnosis present

## 2020-04-16 DIAGNOSIS — F411 Generalized anxiety disorder: Secondary | ICD-10-CM | POA: Diagnosis present

## 2020-04-16 DIAGNOSIS — Y92009 Unspecified place in unspecified non-institutional (private) residence as the place of occurrence of the external cause: Secondary | ICD-10-CM | POA: Diagnosis not present

## 2020-04-16 DIAGNOSIS — K219 Gastro-esophageal reflux disease without esophagitis: Secondary | ICD-10-CM | POA: Diagnosis present

## 2020-04-16 DIAGNOSIS — E785 Hyperlipidemia, unspecified: Secondary | ICD-10-CM | POA: Diagnosis present

## 2020-04-16 DIAGNOSIS — Z8673 Personal history of transient ischemic attack (TIA), and cerebral infarction without residual deficits: Secondary | ICD-10-CM | POA: Diagnosis not present

## 2020-04-16 DIAGNOSIS — N390 Urinary tract infection, site not specified: Secondary | ICD-10-CM | POA: Diagnosis present

## 2020-04-16 DIAGNOSIS — K5909 Other constipation: Secondary | ICD-10-CM | POA: Diagnosis present

## 2020-04-16 DIAGNOSIS — Z8249 Family history of ischemic heart disease and other diseases of the circulatory system: Secondary | ICD-10-CM | POA: Diagnosis not present

## 2020-04-16 DIAGNOSIS — G309 Alzheimer's disease, unspecified: Secondary | ICD-10-CM | POA: Diagnosis present

## 2020-04-16 DIAGNOSIS — E162 Hypoglycemia, unspecified: Secondary | ICD-10-CM | POA: Diagnosis present

## 2020-04-16 DIAGNOSIS — J309 Allergic rhinitis, unspecified: Secondary | ICD-10-CM | POA: Diagnosis present

## 2020-04-16 DIAGNOSIS — F0391 Unspecified dementia with behavioral disturbance: Secondary | ICD-10-CM | POA: Diagnosis not present

## 2020-04-16 DIAGNOSIS — L8990 Pressure ulcer of unspecified site, unspecified stage: Secondary | ICD-10-CM | POA: Insufficient documentation

## 2020-04-16 DIAGNOSIS — R680 Hypothermia, not associated with low environmental temperature: Secondary | ICD-10-CM | POA: Diagnosis present

## 2020-04-16 DIAGNOSIS — E559 Vitamin D deficiency, unspecified: Secondary | ICD-10-CM | POA: Diagnosis present

## 2020-04-16 DIAGNOSIS — W050XXA Fall from non-moving wheelchair, initial encounter: Secondary | ICD-10-CM | POA: Diagnosis present

## 2020-04-16 DIAGNOSIS — E43 Unspecified severe protein-calorie malnutrition: Secondary | ICD-10-CM | POA: Diagnosis present

## 2020-04-16 DIAGNOSIS — F0280 Dementia in other diseases classified elsewhere without behavioral disturbance: Secondary | ICD-10-CM | POA: Diagnosis present

## 2020-04-16 DIAGNOSIS — W19XXXA Unspecified fall, initial encounter: Secondary | ICD-10-CM | POA: Diagnosis not present

## 2020-04-16 DIAGNOSIS — G9341 Metabolic encephalopathy: Secondary | ICD-10-CM | POA: Diagnosis present

## 2020-04-16 DIAGNOSIS — Z20822 Contact with and (suspected) exposure to covid-19: Secondary | ICD-10-CM | POA: Diagnosis present

## 2020-04-16 DIAGNOSIS — I10 Essential (primary) hypertension: Secondary | ICD-10-CM | POA: Diagnosis present

## 2020-04-16 DIAGNOSIS — Z66 Do not resuscitate: Secondary | ICD-10-CM | POA: Diagnosis present

## 2020-04-16 DIAGNOSIS — J449 Chronic obstructive pulmonary disease, unspecified: Secondary | ICD-10-CM | POA: Diagnosis present

## 2020-04-16 DIAGNOSIS — E039 Hypothyroidism, unspecified: Secondary | ICD-10-CM | POA: Diagnosis present

## 2020-04-16 DIAGNOSIS — K589 Irritable bowel syndrome without diarrhea: Secondary | ICD-10-CM | POA: Diagnosis present

## 2020-04-16 DIAGNOSIS — Z993 Dependence on wheelchair: Secondary | ICD-10-CM | POA: Diagnosis not present

## 2020-04-16 LAB — BASIC METABOLIC PANEL
Anion gap: 7 (ref 5–15)
BUN: 10 mg/dL (ref 8–23)
CO2: 27 mmol/L (ref 22–32)
Calcium: 8.8 mg/dL — ABNORMAL LOW (ref 8.9–10.3)
Chloride: 107 mmol/L (ref 98–111)
Creatinine, Ser: 0.74 mg/dL (ref 0.44–1.00)
GFR, Estimated: >60 mL/min (ref >60)
Glucose, Bld: 78 mg/dL (ref 70–99)
Potassium: 4.1 mmol/L (ref 3.5–5.1)
Sodium: 141 mmol/L (ref 135–145)

## 2020-04-16 LAB — CBC
HCT: 34.2 % — ABNORMAL LOW (ref 36.0–46.0)
Hemoglobin: 10.7 g/dL — ABNORMAL LOW (ref 12.0–15.0)
MCH: 30 pg (ref 26.0–34.0)
MCHC: 31.3 g/dL (ref 30.0–36.0)
MCV: 95.8 fL (ref 80.0–100.0)
Platelets: 259 10*3/uL (ref 150–400)
RBC: 3.57 MIL/uL — ABNORMAL LOW (ref 3.87–5.11)
RDW: 13.4 % (ref 11.5–15.5)
WBC: 5.4 10*3/uL (ref 4.0–10.5)
nRBC: 0 % (ref 0.0–0.2)

## 2020-04-16 LAB — GLUCOSE, CAPILLARY: Glucose-Capillary: 73 mg/dL (ref 70–99)

## 2020-04-16 MED ORDER — SODIUM CHLORIDE 0.9 % IV SOLN
1.0000 g | INTRAVENOUS | Status: DC
  Administered 2020-04-16 – 2020-04-17 (×2): 1 g via INTRAVENOUS
  Filled 2020-04-16 (×2): qty 10

## 2020-04-16 MED ORDER — ENSURE ENLIVE PO LIQD
237.0000 mL | Freq: Two times a day (BID) | ORAL | Status: DC
  Administered 2020-04-16: 237 mL via ORAL

## 2020-04-16 NOTE — Progress Notes (Signed)
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  She was admitted onto hospice service on 08/20/19 with a terminal diagnosis of cerebrovascular disease. Pt had unwitnessed fall with contusion noted to forehead.  Per  RN CM notes pt is alert and oriented to self at baseline.  Haldol 0.5mg  4x/day was added in September for increased agitation/combativeness with paranoia/hallucinations, per Dr. Gildardo Cranker, [employer] reported pt less alert than baseline s/p fall.  [facility] was notified prior to EMS being activated. Pt transported by EMS to [facility] ED for evaluation and found to have altered mental status.Per Dr. Tomasa Hosteller, [facility] MD, this is a related hospital admission.  Visited patient at bedside. She was sleeping and did not appear in distress. No visitors at bedside. She did not eat any of her meal today. She is tolerating sips of water and a few bites of applesauce.   VS: 98.6, 167/92, 88, 16, 99% RA I/O: 56.1/200  Abn Labs:  04/16/2020 03:36 Calcium: 8.8 (L) Anion gap: 7 GFR, Estimated: >60 WBC: 5.4 RBC: 3.57 (L) Hemoglobin: 10.7 (L) HCT: 34.2 (L)  Medications: LR 35ml/hr,  Rocephin 1gm IVPB QD  Problem List: Fall with AMS     - Patient is in SNF and is non-ambulatory; fell out of wheelchair     - She has a small midline forehead hematoma without other apparent trauma     - She now has encephalopathy as evidenced by her obtunded/unresponsiveness     - Negative head CT; UA w/ mod leukocytes/WBC     - Her acute metabolic encephalopathy is suspected to be due to concussion without other apparent etiology after head trauma     - PC has reviewed, see their note for details     - 10/24: add rocephin for possible UTI; can rpt head CT tomorrow to look for ischemic stroke if she does not improve Moderate protein-cal malnutrition HTN  HLD COPD Dementia with psychosis/anxiety Hypothyroidism  D/C plan:  discharge back to [facility] with [facility] hospice support. **Please use [facility] for transport, as [facility] contracts with them.** GOC: treat the treatable, DNR Family: Left VM for daughter, Kathryn Whitaker IDT: hospice team updated.  Farrel Gordon, RN, CCM       [facility] Liaison (listed on [place] under Hospice/[facility])     [phone]

## 2020-04-16 NOTE — Progress Notes (Signed)
RN at patient's facility states patient has been requiring a pureed diet. Diet order changed to DYS 1.

## 2020-04-16 NOTE — Progress Notes (Signed)
Patient able to tolerate sips of water both with a straw and without a straw. Patient also able to tolerate bites of applesauce. Patient needs assistance with feeding. No coughing or other signs of distress noted.

## 2020-04-16 NOTE — Progress Notes (Signed)
PROGRESS NOTE    Kathryn Whitaker  CWC:[id number] DOB: 1920-02-03 DOA: 04/15/2020 PCP: Hoyt Koch, MD   Brief Narrative:   Kathryn Whitaker is a 86 y.o. female with medical history significant of malnutrition; CVA; HTN; HLD; and dementia presenting with AMS.  The facility called her son and reported that she fell trying to get out of the wheelchair.  She is nonambulatory, ?maybe she leaned forward.  She hit her head.  Her usual self is alert, speaks generally with normal conversation, "usually not like this."  Her daughter last saw her Sunday and she was very talkative.  In general, would prefer comfort and whatever possible to help her get better.  She is in hospice "but not end of life hospice" - for special bed, wheelchair, special nurse, "it's interactive."  She has been involved with hospice for over a year.  10/24: She is awake this morning and interactive. She can tell me her name and where she is, but is unable to tell me year or president. She states she fell out of her wheelchair, but does not know how. Family has visited her and says that while she is more alert, she is not quite herself. She has a [place] with leukocytes/ WBCs. She is unable to tell me of any symptoms, but she is altered. Will add rocephin. Follow  Assessment & Plan: Fall with AMS     - Patient is in SNF and is non-ambulatory; fell out of wheelchair     - She has a small midline forehead hematoma without other apparent trauma     - She now has encephalopathy as evidenced by her obtunded/unresponsiveness     - Negative head CT; UA w/ mod leukocytes/WBC     - Her acute metabolic encephalopathy is suspected to be due to concussion without other apparent etiology after head trauma     - PC has reviewed, see their note for details     - 10/24: add rocephin for possible UTI; can rpt head CT tomorrow to look for ischemic stroke if she does not improve  Moderate protein-cal malnutrition     - The patient's BMI is  26.2     - The patient has at least 2 indicators for malnutrition (insufficient energy intake, loss of muscle mass, loss of subcutaneous fat, diminished functional status).      - dietary consulted  HTN     - not on home meds for this  HLD     - not on home meds for this  COPD     - not on home meds for this  Dementia with psychosis/anxiety     - hold haldol and lamictal held d/t mentation  Hypothyroidism     - TSH 3.118     - resume home levothyroxine when able to take PO  DVT prophylaxis: lovenox Code Status: DNR Family Communication: spoke with dtr by pohone   Status is: Inpatient  Remains inpatient appropriate because:Altered mental status   Dispo: The patient is from: SNF              Anticipated d/c is to: SNF              Anticipated d/c date is: 2 days              Patient currently is not medically stable to d/c.  Consultants:   None  Procedures:   None  Antimicrobials:  . Rocephin   ROS:  ROS is  negative for all not previously mentioned.  Subjective: "I'm in a hospital."  Objective: Vitals:   04/15/20 2113 04/15/20 2313 04/16/20 0255 04/16/20 0256  BP: (!) 147/83 (!) 156/73 135/67 135/67  Pulse: 83 77 80 75  Resp: 20 15 18 18   Temp: (!) 97.1 F (36.2 C) (!) 97 F (36.1 C)  98.5 F (36.9 C)  TempSrc: Rectal Rectal  Rectal  SpO2: 99% 97% 99% 99%  Weight:    48.5 kg  Height:        Intake/Output Summary (Last 24 hours) at 04/16/2020 1610 Last data filed at 04/16/2020 0256 Gross per 24 hour  Intake 56.1 ml  Output 200 ml  Net -143.9 ml   Filed Weights   04/15/20 1600 04/16/20 0256  Weight: 59.8 kg 48.5 kg    Examination:  General: 86 y.o. female resting in bed in NAD Eyes: PERRL, normal sclera ENMT: Nares patent w/o discharge, orophaynx clear, dentition normal, ears w/o discharge/lesions/ulcers Neck: Supple, trachea midline Cardiovascular: RRR, +S1, S2, no m/g/r, equal pulses throughout Respiratory: CTABL, no w/r/r,  normal WOB GI: BS+, NDNT, no masses noted, no organomegaly noted MSK: No e/c/c Neuro: A&O x 2 (name, place),  no focal deficits Psyc: Appropriate interaction and flat affect, calm/cooperative   Data Reviewed: I have personally reviewed following labs and imaging studies.  CBC: Recent Labs  Lab 04/15/20 1304 04/16/20 0336  WBC 4.4 5.4  NEUTROABS 1.8  --   HGB 11.5* 10.7*  HCT 38.6 34.2*  MCV 101.0* 95.8  PLT 238 960   Basic Metabolic Panel: Recent Labs  Lab 04/15/20 1304 04/16/20 0336  NA 143 141  K 4.5 4.1  CL 109 107  CO2 26 27  GLUCOSE 76 78  BUN 13 10  CREATININE 0.82 0.74  CALCIUM 9.2 8.8*   GFR: Estimated Creatinine Clearance: 30.1 mL/min (by C-G formula based on SCr of 0.74 mg/dL). Liver Function Tests: Recent Labs  Lab 04/15/20 1304  AST 19  ALT 13  ALKPHOS 63  BILITOT 0.5  PROT 6.5  ALBUMIN 2.9*   No results for input(s): LIPASE, AMYLASE in the last 168 hours. Recent Labs  Lab 04/15/20 1304  AMMONIA 31   Coagulation Profile: No results for input(s): INR, PROTIME in the last 168 hours. Cardiac Enzymes: No results for input(s): CKTOTAL, CKMB, CKMBINDEX, TROPONINI in the last 168 hours. BNP (last 3 results) No results for input(s): PROBNP in the last 8760 hours. HbA1C: No results for input(s): HGBA1C in the last 72 hours. CBG: Recent Labs  Lab 04/15/20 1248 04/15/20 1505  GLUCAP 73 79   Lipid Profile: No results for input(s): CHOL, HDL, LDLCALC, TRIG, CHOLHDL, LDLDIRECT in the last 72 hours. Thyroid Function Tests: Recent Labs    04/15/20 1304  TSH 3.118   Anemia Panel: No results for input(s): VITAMINB12, FOLATE, FERRITIN, TIBC, IRON, RETICCTPCT in the last 72 hours. Sepsis Labs: Recent Labs  Lab 04/15/20 1304 04/15/20 1427  LATICACIDVEN 1.9 1.0    Recent Results (from the past 240 hour(s))  Respiratory Panel by RT PCR (Flu A&B, Covid) - Nasopharyngeal Swab     Status: None   Collection Time: 04/15/20  2:24 PM   Specimen:  Nasopharyngeal Swab  Result Value Ref Range Status   SARS Coronavirus 2 by RT PCR NEGATIVE NEGATIVE Final    Comment: (NOTE) SARS-CoV-2 target nucleic acids are NOT DETECTED.  The SARS-CoV-2 RNA is generally detectable in upper respiratoy specimens during the acute phase of infection. The lowest concentration of  SARS-CoV-2 viral copies this assay can detect is 131 copies/mL. A negative result does not preclude SARS-Cov-2 infection and should not be used as the sole basis for treatment or other patient management decisions. A negative result may occur with  improper specimen collection/handling, submission of specimen other than nasopharyngeal swab, presence of viral mutation(s) within the areas targeted by this assay, and inadequate number of viral copies (<131 copies/mL). A negative result must be combined with clinical observations, patient history, and epidemiological information. The expected result is Negative.  Fact Sheet for Patients:  PinkCheek.be  Fact Sheet for Healthcare Providers:  GravelBags.it  This test is no t yet approved or cleared by the [place] FDA and  has been authorized for detection and/or diagnosis of SARS-CoV-2 by FDA under an Emergency Use Authorization (EUA). This EUA will remain  in effect (meaning this test can be used) for the duration of the COVID-19 declaration under Section 564(b)(1) of the Act, 21 U.S.C. section 360bbb-3(b)(1), unless the authorization is terminated or revoked sooner.     Influenza A by PCR NEGATIVE NEGATIVE Final   Influenza B by PCR NEGATIVE NEGATIVE Final    Comment: (NOTE) The Xpert Xpress SARS-CoV-2/FLU/RSV assay is intended as an aid in  the diagnosis of influenza from Nasopharyngeal swab specimens and  should not be used as a sole basis for treatment. Nasal washings and  aspirates are unacceptable for Xpert Xpress SARS-CoV-2/FLU/RSV  testing.  Fact Sheet  for Patients: PinkCheek.be  Fact Sheet for Healthcare Providers: GravelBags.it  This test is not yet approved or cleared by the [place] FDA and  has been authorized for detection and/or diagnosis of SARS-CoV-2 by  FDA under an Emergency Use Authorization (EUA). This EUA will remain  in effect (meaning this test can be used) for the duration of the  Covid-19 declaration under Section 564(b)(1) of the Act, 21  U.S.C. section 360bbb-3(b)(1), unless the authorization is  terminated or revoked. Performed at [facility], [place] [address], [place], [place] [address]   MRSA PCR Screening     Status: None   Collection Time: 04/15/20 10:11 PM   Specimen: Nasal Mucosa; Nasopharyngeal  Result Value Ref Range Status   MRSA by PCR NEGATIVE NEGATIVE Final    Comment:        The GeneXpert MRSA Assay (FDA approved for NASAL specimens only), is one component of a comprehensive MRSA colonization surveillance program. It is not intended to diagnose MRSA infection nor to guide or monitor treatment for MRSA infections. Performed at [facility], [place] [address] [place], [place] [address]       Radiology Studies: CT Head Wo Contrast  Result Date: 04/15/2020 CLINICAL DATA:  Altered mental status EXAM: CT HEAD WITHOUT CONTRAST TECHNIQUE: Contiguous axial images were obtained from the base of the skull through the vertex without intravenous contrast. COMPARISON:  04/27/2019, 06/03/2015 FINDINGS: Brain: No evidence of acute infarction, hemorrhage, hydrocephalus, extra-axial collection or mass lesion/mass effect. Subtle areas of hyperdensity within the left basal ganglia are unchanged compared to multiple prior studies and likely represent small areas of calcification. Small chronic lacunar infarcts in the right basal ganglia and right cerebellum. Moderate low-density changes within the periventricular and subcortical white matter  compatible with chronic microvascular ischemic change. Mild-moderate diffuse cerebral volume loss. Vascular: Atherosclerotic calcifications involving the large vessels of the skull base. No unexpected hyperdense vessel. Skull: Normal. Negative for fracture or focal lesion. Sinuses/Orbits: No acute finding. Other: None. IMPRESSION: 1. No acute  intracranial findings. 2. Chronic microvascular ischemic change and cerebral volume loss. Electronically Signed   By: Davina Poke D.O.   On: 04/15/2020 13:43   DG Chest Portable 1 View  Result Date: 04/15/2020 CLINICAL DATA:  Altered mental status EXAM: PORTABLE CHEST 1 VIEW COMPARISON:  04/27/2019 FINDINGS: Evaluation is limited by patient's kyphotic spinal curvature and rotation. Cardiomediastinal contours are grossly stable with tortuosity of the thoracic aorta. No definite airspace opacity. No evidence of pleural effusion or pneumothorax. Bones are demineralized. No apparent fractures. IMPRESSION: Limited exam.  No evidence of a acute cardiopulmonary abnormality. Electronically Signed   By: Davina Poke D.O.   On: 04/15/2020 15:24     Scheduled Meds: . enoxaparin (LOVENOX) injection  30 mg Subcutaneous Q24H  . sodium chloride flush  3 mL Intravenous Q12H   Continuous Infusions: . lactated ringers 75 mL/hr at 04/15/20 1724     LOS: 0 days    Time spent: 25 minutes spent in the coordination of care today.    Jonnie Finner, DO [facility]  If 7PM-7AM, please contact night-coverage [email or web address] 04/16/2020, 7:08 AM

## 2020-04-17 DIAGNOSIS — W19XXXA Unspecified fall, initial encounter: Secondary | ICD-10-CM | POA: Diagnosis not present

## 2020-04-17 DIAGNOSIS — F0391 Unspecified dementia with behavioral disturbance: Secondary | ICD-10-CM | POA: Diagnosis not present

## 2020-04-17 DIAGNOSIS — Y92009 Unspecified place in unspecified non-institutional (private) residence as the place of occurrence of the external cause: Secondary | ICD-10-CM | POA: Diagnosis not present

## 2020-04-17 LAB — CBC WITH DIFFERENTIAL/PLATELET
Abs Immature Granulocytes: 0.04 10*3/uL (ref 0.00–0.07)
Basophils Absolute: 0 10*3/uL (ref 0.0–0.1)
Basophils Relative: 1 %
Eosinophils Absolute: 0 10*3/uL (ref 0.0–0.5)
Eosinophils Relative: 0 %
HCT: 36.1 % (ref 36.0–46.0)
Hemoglobin: 11.2 g/dL — ABNORMAL LOW (ref 12.0–15.0)
Immature Granulocytes: 1 %
Lymphocytes Relative: 27 %
Lymphs Abs: 1.5 10*3/uL (ref 0.7–4.0)
MCH: 29.6 pg (ref 26.0–34.0)
MCHC: 31 g/dL (ref 30.0–36.0)
MCV: 95.5 fL (ref 80.0–100.0)
Monocytes Absolute: 0.6 10*3/uL (ref 0.1–1.0)
Monocytes Relative: 10 %
Neutro Abs: 3.5 10*3/uL (ref 1.7–7.7)
Neutrophils Relative %: 61 %
Platelets: 228 10*3/uL (ref 150–400)
RBC: 3.78 MIL/uL — ABNORMAL LOW (ref 3.87–5.11)
RDW: 13.3 % (ref 11.5–15.5)
WBC: 5.6 10*3/uL (ref 4.0–10.5)
nRBC: 0 % (ref 0.0–0.2)

## 2020-04-17 LAB — RENAL FUNCTION PANEL
Albumin: 2.5 g/dL — ABNORMAL LOW (ref 3.5–5.0)
Anion gap: 10 (ref 5–15)
BUN: 11 mg/dL (ref 8–23)
CO2: 23 mmol/L (ref 22–32)
Calcium: 8.7 mg/dL — ABNORMAL LOW (ref 8.9–10.3)
Chloride: 106 mmol/L (ref 98–111)
Creatinine, Ser: 0.81 mg/dL (ref 0.44–1.00)
GFR, Estimated: >60 mL/min (ref >60)
Glucose, Bld: 63 mg/dL — ABNORMAL LOW (ref 70–99)
Phosphorus: 2.5 mg/dL (ref 2.5–4.6)
Potassium: 4.1 mmol/L (ref 3.5–5.1)
Sodium: 139 mmol/L (ref 135–145)

## 2020-04-17 LAB — LAMOTRIGINE LEVEL: Lamotrigine Lvl: 5.2 ug/mL (ref 2.0–20.0)

## 2020-04-17 LAB — URINE CULTURE

## 2020-04-17 LAB — MAGNESIUM: Magnesium: 1.8 mg/dL (ref 1.7–2.4)

## 2020-04-17 MED ORDER — LEVOTHYROXINE SODIUM 50 MCG PO TABS
50.0000 ug | ORAL_TABLET | Freq: Every day | ORAL | Status: DC
  Administered 2020-04-18: 50 ug via ORAL
  Filled 2020-04-17: qty 1

## 2020-04-17 MED ORDER — ENSURE ENLIVE PO LIQD
237.0000 mL | Freq: Three times a day (TID) | ORAL | Status: DC
  Administered 2020-04-17 – 2020-04-18 (×2): 237 mL via ORAL

## 2020-04-17 NOTE — Progress Notes (Signed)
Initial Nutrition Assessment  DOCUMENTATION CODES:   Severe malnutrition in context of chronic illness  INTERVENTION:    Ensure Enlive po TID, each supplement provides 350 kcal and 20 grams of protein  Magic cup TID with meals, each supplement provides 290 kcal and 9 grams of protein  NUTRITION DIAGNOSIS:   Severe Malnutrition related to chronic illness (COPD, dementia) as evidenced by severe muscle depletion, severe fat depletion.  GOAL:   Patient will meet greater than or equal to 90% of their needs  MONITOR:   PO intake, Supplement acceptance  REASON FOR ASSESSMENT:   Consult Assessment of nutrition requirement/status  ASSESSMENT:   86 yo female admitted S/P unwitnessed fall with forehead contusion at [facility]. PMH includes terminal CVD (followed by Hospice), moderate PCM, HTN, HLD, COPD, dementia, hypothyroidism.   CT head negative on admission. Encephalopathy suspected to be due to concussion from fall.    Diet was changed to dysphagia 1 (pureed) 10/24 after RN received information from nursing facility that patient was on pureed diet there. Daughter in room with patient states that she was unaware that patient was on a pureed diet at the SNF.    Patient has only been taking bites and sips of foods/beverages at meals. She likes vanilla and strawberry Ensure supplements. She also likes ice cream, will add Magic cup supplement with meals. Patient is requiring help from nursing staff with feeding. Patient says that she can feed herself sometimes, but needs help with feeding sometimes.   Labs and medications reviewed.  NUTRITION - FOCUSED PHYSICAL EXAM:    Most Recent Value  Orbital Region Severe depletion  Upper Arm Region Moderate depletion  Thoracic and Lumbar Region Severe depletion  Buccal Region Moderate depletion  Temple Region Severe depletion  Clavicle Bone Region Severe depletion  Clavicle and Acromion Bone Region Severe depletion  Scapular Bone Region Severe  depletion  Dorsal Hand Mild depletion  Patellar Region Severe depletion  Anterior Thigh Region Severe depletion  Posterior Calf Region Moderate depletion  Edema (RD Assessment) None  Hair Reviewed  Eyes Reviewed  Mouth Reviewed  Skin Reviewed  Nails Reviewed       Diet Order:   Diet Order            DIET - DYS 1 Room service appropriate? Yes; Fluid consistency: Thin  Diet effective now                 EDUCATION NEEDS:   No education needs have been identified at this time  Skin:  Skin Assessment: Skin Integrity Issues: Skin Integrity Issues:: Stage II Stage II: R & L sacrum  Last BM:  10/24  Height:   Ht Readings from Last 1 Encounters:  04/15/20 5\' 2"  (1.575 m)    Weight:   Wt Readings from Last 1 Encounters:  04/17/20 56.2 kg    Ideal Body Weight:  50 kg  BMI:  Body mass index is 22.68 kg/m.  Estimated Nutritional Needs:   Kcal:  1400-1600  Protein:  75-85 gm  Fluid:  >/= 1.5 L    Lucas Mallow, RD, LDN, CNSC Please refer to Amion for contact information.

## 2020-04-17 NOTE — Progress Notes (Signed)
PROGRESS NOTE    Kathryn Whitaker  LKG:[id number] DOB: 1919-07-20 DOA: 04/15/2020 PCP: Hoyt Koch, MD   Brief Narrative:   Kathryn Whitaker a 86 y.o.femalewith medical history significant ofmalnutrition; CVA; HTN; HLD; and dementia presenting with AMS.The facility called her son and reported that she fell trying to get out of the wheelchair. She is nonambulatory, ?maybe she leaned forward. She hit her head. Her usual self is alert, speaks generally with normal conversation, "usually not like this." Her daughter last saw her Sunday and she was very talkative. In general, would prefer comfort and whatever possible to help her get better. She is in hospice "but not end of life hospice" - for special bed, wheelchair, special nurse, "it's interactive." She has been involved with hospice for over a year.  10/25: She says she feels a little better today and that she is ready to eat. Hypoglycemic this AM. Appreciate dietician assistance. Encourage diet. Continue rocephin. D/c tomorrow?   Assessment & Plan: Fall with AMS UTI     - Patient is in SNF and is non-ambulatory; fell out of wheelchair     - She has a small midline forehead hematoma without other apparent trauma     - Negative head CT; UA w/ mod leukocytes/WBC     - Her acute metabolic encephalopathy is suspected to be due to concussion without other apparent etiology after head trauma     - PC has reviewed, see their note for details     - 10/25: mentation is a little better today; she is more interactive; continue rocephin; possible d/c tomorrow  Moderate protein-cal malnutrition Hypoglycemia     - The patient's BMI is 26.2     - The patient has at least 2 indicators for malnutrition (insufficient energy intake, loss of muscle mass, loss of subcutaneous fat, diminished functional status).      - dietary consulted, appreciate assistance     - 10/25: says she wants to eat today; encourage diet  HTN     - not  on home meds for this  HLD     - not on home meds for this  COPD     - not on home meds for this  Dementia with psychosis/anxiety     - hold haldol and lamictal held d/t mentation  Hypothyroidism     - TSH 3.118     - resume home levothyroxine  DVT prophylaxis: lovenox Code Status: DNR Family Communication: None at bedside   Status is: Inpatient  Remains inpatient appropriate because:Altered mental status   Dispo: The patient is from: SNF              Anticipated d/c is to: SNF              Anticipated d/c date is: 1 day              Patient currently is not medically stable to d/c.  Consultants:   None  Procedures:   None  Antimicrobials:  . Rocephin   ROS:  ROS is negative for all not previously mentioned.  Subjective: "I don't remember seeing that."  Objective: Vitals:   04/17/20 0400 04/17/20 0406 04/17/20 0817 04/17/20 1125  BP: (!) 158/87  (!) 177/92 (!) 152/95  Pulse: 88  86 87  Resp: 19  18 18   Temp: 98.7 F (37.1 C)  99.4 F (37.4 C) 99.1 F (37.3 C)  TempSrc: Oral  Oral Oral  SpO2: 98%  96%  95%  Weight:  56.2 kg    Height:        Intake/Output Summary (Last 24 hours) at 04/17/2020 1426 Last data filed at 04/17/2020 3976 Gross per 24 hour  Intake 1157 ml  Output 1150 ml  Net 7 ml   Filed Weights   04/15/20 1600 04/16/20 0256 04/17/20 0406  Weight: 59.8 kg 48.5 kg 56.2 kg   Examination:  General: 86 y.o. frail appearing female resting in bed in NAD Cardiovascular: RRR, +S1, S2, no m/g/r, equal pulses throughout Respiratory: CTABL, no w/r/r, normal WOB GI: BS+, NDNT, no masses noted, no organomegaly noted MSK: No e/c/c Neuro: A&O x 2 no focal deficits Psyc: Appropriate interaction and affect, calm/cooperative  Data Reviewed: I have personally reviewed following labs and imaging studies.  CBC: Recent Labs  Lab 04/15/20 1304 04/16/20 0336 04/17/20 0849  WBC 4.4 5.4 5.6  NEUTROABS 1.8  --  3.5  HGB 11.5* 10.7* 11.2*    HCT 38.6 34.2* 36.1  MCV 101.0* 95.8 95.5  PLT 238 259 734   Basic Metabolic Panel: Recent Labs  Lab 04/15/20 1304 04/16/20 0336 04/17/20 0849  NA 143 141 139  K 4.5 4.1 4.1  CL 109 107 106  CO2 26 27 23   GLUCOSE 76 78 63*  BUN 13 10 11   CREATININE 0.82 0.74 0.81  CALCIUM 9.2 8.8* 8.7*  MG  --   --  1.8  PHOS  --   --  2.5   GFR: Estimated Creatinine Clearance: 30.7 mL/min (by C-G formula based on SCr of 0.81 mg/dL). Liver Function Tests: Recent Labs  Lab 04/15/20 1304 04/17/20 0849  AST 19  --   ALT 13  --   ALKPHOS 63  --   BILITOT 0.5  --   PROT 6.5  --   ALBUMIN 2.9* 2.5*   No results for input(s): LIPASE, AMYLASE in the last 168 hours. Recent Labs  Lab 04/15/20 1304  AMMONIA 31   Coagulation Profile: No results for input(s): INR, PROTIME in the last 168 hours. Cardiac Enzymes: No results for input(s): CKTOTAL, CKMB, CKMBINDEX, TROPONINI in the last 168 hours. BNP (last 3 results) No results for input(s): PROBNP in the last 8760 hours. HbA1C: No results for input(s): HGBA1C in the last 72 hours. CBG: Recent Labs  Lab 04/15/20 1248 04/15/20 1505 04/16/20 0940  GLUCAP 73 79 73   Lipid Profile: No results for input(s): CHOL, HDL, LDLCALC, TRIG, CHOLHDL, LDLDIRECT in the last 72 hours. Thyroid Function Tests: Recent Labs    04/15/20 1304  TSH 3.118   Anemia Panel: No results for input(s): VITAMINB12, FOLATE, FERRITIN, TIBC, IRON, RETICCTPCT in the last 72 hours. Sepsis Labs: Recent Labs  Lab 04/15/20 1304 04/15/20 1427  LATICACIDVEN 1.9 1.0    Recent Results (from the past 240 hour(s))  Respiratory Panel by RT PCR (Flu A&B, Covid) - Nasopharyngeal Swab     Status: None   Collection Time: 04/15/20  2:24 PM   Specimen: Nasopharyngeal Swab  Result Value Ref Range Status   SARS Coronavirus 2 by RT PCR NEGATIVE NEGATIVE Final    Comment: (NOTE) SARS-CoV-2 target nucleic acids are NOT DETECTED.  The SARS-CoV-2 RNA is generally detectable  in upper respiratoy specimens during the acute phase of infection. The lowest concentration of SARS-CoV-2 viral copies this assay can detect is 131 copies/mL. A negative result does not preclude SARS-Cov-2 infection and should not be used as the sole basis for treatment or other patient management decisions. A  negative result may occur with  improper specimen collection/handling, submission of specimen other than nasopharyngeal swab, presence of viral mutation(s) within the areas targeted by this assay, and inadequate number of viral copies (<131 copies/mL). A negative result must be combined with clinical observations, patient history, and epidemiological information. The expected result is Negative.  Fact Sheet for Patients:  PinkCheek.be  Fact Sheet for Healthcare Providers:  GravelBags.it  This test is no t yet approved or cleared by the [place] FDA and  has been authorized for detection and/or diagnosis of SARS-CoV-2 by FDA under an Emergency Use Authorization (EUA). This EUA will remain  in effect (meaning this test can be used) for the duration of the COVID-19 declaration under Section 564(b)(1) of the Act, 21 U.S.C. section 360bbb-3(b)(1), unless the authorization is terminated or revoked sooner.     Influenza A by PCR NEGATIVE NEGATIVE Final   Influenza B by PCR NEGATIVE NEGATIVE Final    Comment: (NOTE) The Xpert Xpress SARS-CoV-2/FLU/RSV assay is intended as an aid in  the diagnosis of influenza from Nasopharyngeal swab specimens and  should not be used as a sole basis for treatment. Nasal washings and  aspirates are unacceptable for Xpert Xpress SARS-CoV-2/FLU/RSV  testing.  Fact Sheet for Patients: PinkCheek.be  Fact Sheet for Healthcare Providers: GravelBags.it  This test is not yet approved or cleared by the [place] FDA and  has been  authorized for detection and/or diagnosis of SARS-CoV-2 by  FDA under an Emergency Use Authorization (EUA). This EUA will remain  in effect (meaning this test can be used) for the duration of the  Covid-19 declaration under Section 564(b)(1) of the Act, 21  U.S.C. section 360bbb-3(b)(1), unless the authorization is  terminated or revoked. Performed at [facility], [place] [address], [place], [place] [address]   Urine culture     Status: Abnormal   Collection Time: 04/15/20  9:59 PM   Specimen: Urine, Clean Catch  Result Value Ref Range Status   Specimen Description URINE, CLEAN CATCH  Final   Special Requests   Final    NONE Performed at [facility], [place] [address] [place], [place] [address]    Culture MULTIPLE SPECIES PRESENT, SUGGEST RECOLLECTION (A)  Final   Report Status 04/17/2020 FINAL  Final  MRSA PCR Screening     Status: None   Collection Time: 04/15/20 10:11 PM   Specimen: Nasal Mucosa; Nasopharyngeal  Result Value Ref Range Status   MRSA by PCR NEGATIVE NEGATIVE Final    Comment:        The GeneXpert MRSA Assay (FDA approved for NASAL specimens only), is one component of a comprehensive MRSA colonization surveillance program. It is not intended to diagnose MRSA infection nor to guide or monitor treatment for MRSA infections. Performed at [facility], [place] [address] [place],  [address]       Radiology Studies: DG Chest Portable 1 View  Result Date: 04/15/2020 CLINICAL DATA:  Altered mental status EXAM: PORTABLE CHEST 1 VIEW COMPARISON:  04/27/2019 FINDINGS: Evaluation is limited by patient's kyphotic spinal curvature and rotation. Cardiomediastinal contours are grossly stable with tortuosity of the thoracic aorta. No definite airspace opacity. No evidence of pleural effusion or pneumothorax. Bones are demineralized. No apparent fractures. IMPRESSION: Limited exam.  No evidence of a acute cardiopulmonary abnormality. Electronically  Signed   By: Davina Poke D.O.   On: 04/15/2020 15:24     Scheduled Meds: . enoxaparin (LOVENOX) injection  30 mg Subcutaneous Q24H  . feeding supplement  237 mL Oral TID BM  . sodium chloride flush  3 mL Intravenous Q12H   Continuous Infusions: . cefTRIAXone (ROCEPHIN)  IV 200 mL/hr at 04/16/20 1700  . lactated ringers 75 mL/hr at 04/16/20 2328     LOS: 1 day    Time spent: 25 minutes spent in the coordination of care today.    Jonnie Finner, DO [facility]  If 7PM-7AM, please contact night-coverage [email or web address] 04/17/2020, 2:26 PM

## 2020-04-17 NOTE — Progress Notes (Signed)
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  She was admitted onto hospice service on 08/20/19 with a terminal diagnosis of cerebrovascular disease. Pt had unwitnessed fall with contusion noted to forehead.  Per  RN CM notes pt is alert and oriented to self at baseline.  Haldol 0.5mg  4x/day was added in September for increased agitation/combativeness with paranoia/hallucinations, per Dr. Gildardo Cranker, [place] reported pt less alert than baseline s/p fall.  [facility] was notified prior to EMS being activated. Pt transported by EMS to [facility] ED for evaluation and found to have altered mental status.Per Dr. Tomasa Hosteller, [facility] MD, this is a related hospital admission.  Visited patient at bedside. Daughter left message she was on her way to see patient but is not present during my visit.  Patient is awake and alert. Appears comfortable and not in distress. She denies pain or discomfort. Confused to place and situation at baseline.   VS:  99.1, 152/95, 87, 18, 95% I/O: 1217/1150  Abn Labs:  Glucose: 63 (L) Calcium: 8.7 (L) Albumin: 2.5 (L) RBC: 3.78 (L) Hemoglobin: 11.2 (L) Glucose: 63 (L)  Medications: Rocephin 1g IBPB QD, LR @ 27ml/hr  Problem list:  Assessment & Plan: Fall with AMS UTI - Patient is in SNF and is non-ambulatory; fell out of wheelchair - She has a small midline forehead hematoma without other apparent trauma - Negative head CT; UA w/ mod leukocytes/WBC - Her acute metabolic encephalopathy is suspected to be due to concussion without other apparent etiology after head trauma - PC has reviewed, see their note for details     - 10/25: mentation is a little better today; she is more interactive; continue rocephin; possible d/c tomorrow Moderate protein-cal malnutrition Hypoglycemia HTN HLD COPD Dementia with psychosis/anxiety - hold haldol and lamictal held d/t  mentation Hypothyroidism  D/C plan[facility]. **Please use [facility] for transport, as [facility] contracts with them.** GOC: treat the treatable, DNR Family: Left VM for daughter, Kathryn Whitaker IDT: hospice team updated.  Farrel Gordon, RN, CCM       [facility] Liaison (listed on [place] under Hospice/[facility])     [phone]

## 2020-04-18 DIAGNOSIS — F0391 Unspecified dementia with behavioral disturbance: Secondary | ICD-10-CM | POA: Diagnosis not present

## 2020-04-18 DIAGNOSIS — F411 Generalized anxiety disorder: Secondary | ICD-10-CM | POA: Diagnosis not present

## 2020-04-18 DIAGNOSIS — E039 Hypothyroidism, unspecified: Secondary | ICD-10-CM | POA: Diagnosis not present

## 2020-04-18 DIAGNOSIS — W19XXXA Unspecified fall, initial encounter: Secondary | ICD-10-CM | POA: Diagnosis not present

## 2020-04-18 LAB — RENAL FUNCTION PANEL
Albumin: 2.4 g/dL — ABNORMAL LOW (ref 3.5–5.0)
Anion gap: 9 (ref 5–15)
BUN: 9 mg/dL (ref 8–23)
CO2: 27 mmol/L (ref 22–32)
Calcium: 8.7 mg/dL — ABNORMAL LOW (ref 8.9–10.3)
Chloride: 107 mmol/L (ref 98–111)
Creatinine, Ser: 0.79 mg/dL (ref 0.44–1.00)
GFR, Estimated: >60 mL/min (ref >60)
Glucose, Bld: 81 mg/dL (ref 70–99)
Phosphorus: 2.7 mg/dL (ref 2.5–4.6)
Potassium: 3.6 mmol/L (ref 3.5–5.1)
Sodium: 143 mmol/L (ref 135–145)

## 2020-04-18 LAB — CBC WITH DIFFERENTIAL/PLATELET
Abs Immature Granulocytes: 0.05 10*3/uL (ref 0.00–0.07)
Basophils Absolute: 0.1 10*3/uL (ref 0.0–0.1)
Basophils Relative: 1 %
Eosinophils Absolute: 0.1 10*3/uL (ref 0.0–0.5)
Eosinophils Relative: 2 %
HCT: 33.4 % — ABNORMAL LOW (ref 36.0–46.0)
Hemoglobin: 10.5 g/dL — ABNORMAL LOW (ref 12.0–15.0)
Immature Granulocytes: 1 %
Lymphocytes Relative: 31 %
Lymphs Abs: 1.7 10*3/uL (ref 0.7–4.0)
MCH: 29.7 pg (ref 26.0–34.0)
MCHC: 31.4 g/dL (ref 30.0–36.0)
MCV: 94.4 fL (ref 80.0–100.0)
Monocytes Absolute: 0.6 10*3/uL (ref 0.1–1.0)
Monocytes Relative: 11 %
Neutro Abs: 3 10*3/uL (ref 1.7–7.7)
Neutrophils Relative %: 54 %
Platelets: 232 10*3/uL (ref 150–400)
RBC: 3.54 MIL/uL — ABNORMAL LOW (ref 3.87–5.11)
RDW: 13.3 % (ref 11.5–15.5)
WBC: 5.5 10*3/uL (ref 4.0–10.5)
nRBC: 0 % (ref 0.0–0.2)

## 2020-04-18 MED ORDER — SODIUM CHLORIDE 0.9 % IV SOLN
1.0000 g | Freq: Once | INTRAVENOUS | Status: AC
  Administered 2020-04-18: 1 g via INTRAVENOUS
  Filled 2020-04-18: qty 10

## 2020-04-18 MED ORDER — ENSURE ENLIVE PO LIQD: 237.0000 mL | Freq: Three times a day (TID) | ORAL | 12 refills | Status: AC

## 2020-04-18 NOTE — TOC Transition Note (Signed)
Transition of Care [facility]) - CM/SW Discharge Note   Patient Details  Name: Kathryn Whitaker MRN: [id number] Date of Birth: Dec 07, 1919  [facility] of [facility]) CM/SW Contact:  Trula Ore, [place] Phone Number: 04/18/2020, 11:51 AM   Clinical Narrative:     [facility] will DC to: [facility]  Anticipated DC date: 04/18/2020  Family notified: [name]  Transport by: [facility]  ?  Per MD patient ready for DC to [facility] with Hospice Services to follow. RN, patient, patient's family, Venia Carbon with [place], and facility notified of DC. Discharge Summary and FL2 sent to facility. RN given number for report tele#[phone] RM# [phone]. DC packet on chart. DNR signed by MD and attached to DC packet.Ambulance transport requested for patient.  CSW signing off.  Final next level of care: Assisted Living [facility]) Barriers to Discharge: No Barriers Identified   Patient Goals and CMS Choice     Choice offered to / list presented to : Adult Children [name])  Discharge Placement              Patient chooses bed at: [facility] Patient to be transferred to facility by: [facility] EMS Name of family member notified: [name] Patient and family notified of of transfer: 04/18/20  Discharge Plan and Services                                     Social Determinants of Health (SDOH) Interventions     Readmission Risk Interventions No flowsheet data found.

## 2020-04-18 NOTE — Progress Notes (Signed)
Report called to Renda Rolls,  [facility] Administrator.Questions answered ; verbalized understanding  Patent transferred out via stretcher, alert and at baseline. Leveda Anna, RN, BSN

## 2020-04-18 NOTE — NC FL2 (Addendum)
[place] MEDICAID FL2 LEVEL OF CARE SCREENING TOOL     IDENTIFICATION  Patient Name: Kathryn Whitaker Birthdate: 11/12/1919 Sex: female Admission Date (Current Location): 04/15/2020  [facility] and [place] Number:  Herbalist and Address:  [facility] [place], [place] [address], [place], [place] [address]      Provider Number: [id number]  Attending Physician Name and Address:  Jonnie Finner, DO  Relative Name and Phone Number:  [name] [phone]    Current Level of Care: Hospital Recommended Level of Care: Memory Care  Prior Approval Number:    Date Approved/Denied:   PASRR Number:    Discharge Plan: Other (Comment) (ALF)    Current Diagnoses: Patient Active Problem List   Diagnosis Date Noted   Pressure injury of skin 04/16/2020   Fall at home, initial encounter 04/15/2020   Palliative care by specialist    Hospice care patient    Acute urinary retention 01/30/2020   Dehydration 01/30/2020   UTI (urinary tract infection) 01/29/2020   Fecal impaction ([place]) 29/51/8841   Acute metabolic encephalopathy 66/11/3014   AMS (altered mental status) 04/27/2019   Hallucinations    Acute blood loss anemia 01/15/2017   Hip fracture, left ([place]) 01/12/2017   Hip fracture ([place]) 01/12/2017   S/p left hip fracture 01/12/2017   Dementia with psychosis ([place]) 07/26/2016   Dysuria 08/10/2015   Hypothyroidism 06/06/2015   Protein-calorie malnutrition, severe ([place]) 06/26/2014   Constipation 12/17/2013   COPD (chronic obstructive pulmonary disease) ([place]) 06/03/2013   IBS (irritable bowel syndrome) 12/30/2012   Generalized anxiety disorder 12/30/2012   Dyslipidemia 12/12/2012   HTN (hypertension) 12/12/2012   Hx of completed stroke 07/11/2009    Orientation RESPIRATION BLADDER Height & Weight     Self  Normal Incontinent, External catheter (External Urinary Catheter) Weight: 131 lb (59.4 kg) Height:  5\' 2"  (157.5 cm)  BEHAVIORAL  SYMPTOMS/MOOD NEUROLOGICAL BOWEL NUTRITION STATUS      Incontinent Diet (Puree-No added Table Salt)  AMBULATORY STATUS COMMUNICATION OF NEEDS Skin   Total Care Verbally Skin abrasions, Other (Comment) (Dry;Flaky,Abrasion Foot Bilateral foam,PI sacrum Right;Left Stage 2,foam-lift dressing,clean,dry,intact,PRN)                       Personal Care Assistance Level of Assistance  Bathing, Feeding, Dressing Bathing Assistance: Maximum assistance Feeding assistance: Maximum assistance (Total Assist-Puree-No added table salt) Dressing Assistance: Maximum assistance     Functional Limitations Info  Sight, Hearing, Speech Sight Info: Adequate Hearing Info: Adequate Speech Info: Adequate    SPECIAL CARE FACTORS FREQUENCY  PT (By licensed PT), OT (By licensed OT)     PT Frequency: 3x min weekly OT Frequency: 3x min weekly            Contractures Contractures Info: Not present    Additional Factors Info  Code Status, Allergies Code Status Info: DNR Allergies Info: Demerol,Influenza Vaccines,Amoxicillin,Clarithromycin,Morphine And Related,Peanuts ,Prevpac ,Telithromycin           TAKE these medications   acetaminophen 500 MG tablet Commonly known as: TYLENOL Take 500 mg by mouth every 4 (four) hours as needed for mild pain, fever or headache.   BAZA EX Apply 1 application topically See admin instructions. Apply to buttocks bid and after each incontinence episode   feeding supplement Liqd Take 237 mLs by mouth 3 (three) times daily between meals.   guaifenesin 100 MG/5ML syrup Commonly known as: ROBITUSSIN Take 200 mg by mouth every 6 (six) hours  as needed for cough.   haloperidol 2 MG/ML solution Commonly known as: HALDOL Take 0.5 mg by mouth in the morning, at noon, in the evening, and at bedtime.   lactulose 10 GM/15ML solution Commonly known as: CHRONULAC Take 30 g by mouth 2 (two) times daily as needed for mild constipation.   lamoTRIgine 25 MG  tablet Commonly known as: LAMICTAL Take 3 tablets (75 mg total) by mouth 2 (two) times daily. What changed: how much to take   levothyroxine 50 MCG tablet Commonly known as: SYNTHROID TAKE 1 TABLET (50 MCG TOTAL) BY MOUTH DAILY BEFORE BREAKFAST.   loperamide 2 MG capsule Commonly known as: IMODIUM Take 2 mg by mouth as needed for diarrhea or loose stools.   magnesium hydroxide 400 MG/5ML suspension Commonly known as: MILK OF MAGNESIA Take 30 mLs by mouth at bedtime as needed for mild constipation.   Mintox Regular Strength 034-742-59 MG/5ML suspension Generic drug: alum & mag hydroxide-simeth Take 30 mLs by mouth every 6 (six) hours as needed for indigestion or heartburn.   neomycin-bacitracin-polymyxin [phone] ointment Apply 1 application topically daily as needed (skin abrasions).   PRESCRIPTION MEDICATION Take 1 each by mouth in the morning, at noon, and at bedtime. Mighty shake   senna 8.6 MG Tabs tablet Commonly known as: SENOKOT Take 2 tablets by mouth 2 (two) times daily.   trolamine salicylate 10 % cream Commonly known as: ASPERCREME Apply 1 application topically in the morning and at bedtime. Right knee      Relevant Imaging Results:  Relevant Lab Results:   Additional Information SSN-[id number]  Trula Ore, LCSWA

## 2020-04-18 NOTE — Discharge Summary (Signed)
Physician Discharge Summary  Kathryn Whitaker [facility]:[id number] DOB: 1920-02-23 DOA: 04/15/2020  PCP: Hoyt Koch, MD  Admit date: 04/15/2020 Discharge date: 04/18/2020  Ad[facility] From: [facility] Disposition:  [facility]  Recommendations for Outpatient Follow-up:  1. Follow up with PCP in 1 week  Discharge Condition: Stable  CODE STATUS: DNR   Brief/Interim Summary: Kathryn Whitaker a 86 y.o.femalewith medical history significant ofmalnutrition; CVA; HTN; HLD; and dementia presenting with AMS.The facility called her son and reported that she fell trying to get out of the wheelchair. She is nonambulatory, ?maybe she leaned forward. She hit her head. Her usual self is alert, speaks generally with normal conversation, "usually not like this." Her daughter last saw her Sunday and she was very talkative. In general, would prefer comfort and whatever possible to help her get better. She is in hospice "but not end of life hospice" - for special bed, wheelchair, special nurse, "it's interactive." She has been involved with hospice for over a year.  10/26: Eating breakfast this AM and more energetic. She says she feels ok. Last dose of rocephin today. Ok to d/c back to facility. Updated daughter with plan.  Discharge Diagnoses: Fall with AMS UTI - Patient is in SNF and is non-ambulatory; fell out of wheelchair - She has a small midline forehead hematoma without other apparent trauma - Negative head CT; UA w/ mod leukocytes/WBC - Her acute metabolic encephalopathy is suspected to be due to concussion without other apparent etiology after head trauma - PC has reviewed, see their note for details     - 10/26: last dose of rocephin today; mentation is improved, she denies complaints. Ok to d/c after abx today.  Moderate protein-cal malnutrition Hypoglycemia - The patient's BMI is 26.2 - The patient has at least 2 indicators for  malnutrition (insufficient energy intake, loss of muscle mass, loss of subcutaneous fat, diminished functional status).  - dietary consulted, appreciate assistance     - dietician has reviewed: Ensure Enlive po TID, each supplement provides 350 kcal and 20 grams of protein; Magic cup TID with meals, each supplement provides 290 kcal and 9 grams of protein  HTN - not on home meds for this  HLD - not on home meds for this  COPD - not on home meds for this  Dementia with psychosis/anxiety - hold haldol and lamictal held d/t mentation     - can resume home regimen at discharge  Hypothyroidism - TSH 3.118 - continue home levothyroxine   Discharge Instructions   Allergies as of 04/18/2020      Reactions   Demerol [meperidine]    Pt states she can't remember type of reaction   Influenza Vaccines    Pt can't remember reaction but states allergic and has not had in 25 years   Amoxicillin    PER MAR   Clarithromycin Other (See Comments)   Unknown   Morphine And Related Dermatitis   Peanuts [peanut Oil]    Pt reports she is allergic to peanuts, but was asking to eat peanut butter. Pt unsure of her reaction.    Prevpac [amoxicill-clarithro-lansopraz] Hives   .Marland KitchenHas patient had a PCN reaction causing immediate rash, facial/tongue/throat swelling, SOB or lightheadedness with hypotension: unknown Has patient had a PCN reaction causing severe rash involving mucus membranes or skin necrosis: unknown Has patient had a PCN reaction that required hospitalization unknown Has patient had a PCN reaction occurring within the last 10 years: unknown If all of the above  answers are "NO", then may proceed with Cephalosporin use.   Telithromycin Hives   REACTION: pt states HIVES....      Medication List    STOP taking these medications   oxycodone 5 MG capsule Commonly known as: OXY-IR     TAKE these medications   acetaminophen 500 MG tablet Commonly known  as: TYLENOL Take 500 mg by mouth every 4 (four) hours as needed for mild pain, fever or headache.   BAZA EX Apply 1 application topically See admin instructions. Apply to buttocks bid and after each incontinence episode   feeding supplement Liqd Take 237 mLs by mouth 3 (three) times daily between meals.   guaifenesin 100 MG/5ML syrup Commonly known as: ROBITUSSIN Take 200 mg by mouth every 6 (six) hours as needed for cough.   haloperidol 2 MG/ML solution Commonly known as: HALDOL Take 0.5 mg by mouth in the morning, at noon, in the evening, and at bedtime.   lactulose 10 GM/15ML solution Commonly known as: CHRONULAC Take 30 g by mouth 2 (two) times daily as needed for mild constipation.   lamoTRIgine 25 MG tablet Commonly known as: LAMICTAL Take 3 tablets (75 mg total) by mouth 2 (two) times daily. What changed: how much to take   levothyroxine 50 MCG tablet Commonly known as: SYNTHROID TAKE 1 TABLET (50 MCG TOTAL) BY MOUTH DAILY BEFORE BREAKFAST.   loperamide 2 MG capsule Commonly known as: IMODIUM Take 2 mg by mouth as needed for diarrhea or loose stools.   magnesium hydroxide 400 MG/5ML suspension Commonly known as: MILK OF MAGNESIA Take 30 mLs by mouth at bedtime as needed for mild constipation.   Mintox Regular Strength 671-245-80 MG/5ML suspension Generic drug: alum & mag hydroxide-simeth Take 30 mLs by mouth every 6 (six) hours as needed for indigestion or heartburn.   neomycin-bacitracin-polymyxin [phone] ointment Apply 1 application topically daily as needed (skin abrasions).   PRESCRIPTION MEDICATION Take 1 each by mouth in the morning, at noon, and at bedtime. Mighty shake   senna 8.6 MG Tabs tablet Commonly known as: SENOKOT Take 2 tablets by mouth 2 (two) times daily.   trolamine salicylate 10 % cream Commonly known as: ASPERCREME Apply 1 application topically in the morning and at bedtime. Right knee       Allergies  Allergen Reactions  .  Demerol [Meperidine]     Pt states she can't remember type of reaction  . Influenza Vaccines     Pt can't remember reaction but states allergic and has not had in 25 years  . Amoxicillin     PER MAR  . Clarithromycin Other (See Comments)    Unknown  . Morphine And Related Dermatitis  . Peanuts [Peanut Oil]     Pt reports she is allergic to peanuts, but was asking to eat peanut butter. Pt unsure of her reaction.   [name] [Amoxicill-Clarithro-Lansopraz] Hives    .Marland KitchenHas patient had a PCN reaction causing immediate rash, facial/tongue/throat swelling, SOB or lightheadedness with hypotension: unknown Has patient had a PCN reaction causing severe rash involving mucus membranes or skin necrosis: unknown Has patient had a PCN reaction that required hospitalization unknown Has patient had a PCN reaction occurring within the last 10 years: unknown If all of the above answers are "NO", then may proceed with Cephalosporin use.   . Telithromycin Hives    REACTION: pt states HIVES....    Consultations:  None  Wound Care: Pressure Injury 04/15/20 Sacrum Right;Left Stage 2 -  Partial  thickness loss of dermis presenting as a shallow open injury with a red, pink wound bed without slough. pink  (Active)  04/15/20 1854  Location: Sacrum  Location Orientation: Right;Left  Staging: Stage 2 -  Partial thickness loss of dermis presenting as a shallow open injury with a red, pink wound bed without slough.  Wound Description (Comments): pink   Present on Admission: Yes    Procedures/Studies: CT Head Wo Contrast  Result Date: 04/15/2020 CLINICAL DATA:  Altered mental status EXAM: CT HEAD WITHOUT CONTRAST TECHNIQUE: Contiguous axial images were obtained from the base of the skull through the vertex without intravenous contrast. COMPARISON:  04/27/2019, 06/03/2015 FINDINGS: Brain: No evidence of acute infarction, hemorrhage, hydrocephalus, extra-axial collection or mass lesion/mass effect. Subtle areas  of hyperdensity within the left basal ganglia are unchanged compared to multiple prior studies and likely represent small areas of calcification. Small chronic lacunar infarcts in the right basal ganglia and right cerebellum. Moderate low-density changes within the periventricular and subcortical white matter compatible with chronic microvascular ischemic change. Mild-moderate diffuse cerebral volume loss. Vascular: Atherosclerotic calcifications involving the large vessels of the skull base. No unexpected hyperdense vessel. Skull: Normal. Negative for fracture or focal lesion. Sinuses/Orbits: No acute finding. Other: None. IMPRESSION: 1. No acute intracranial findings. 2. Chronic microvascular ischemic change and cerebral volume loss. Electronically Signed   By: Davina Poke D.O.   On: 04/15/2020 13:43   DG Chest Portable 1 View  Result Date: 04/15/2020 CLINICAL DATA:  Altered mental status EXAM: PORTABLE CHEST 1 VIEW COMPARISON:  04/27/2019 FINDINGS: Evaluation is limited by patient's kyphotic spinal curvature and rotation. Cardiomediastinal contours are grossly stable with tortuosity of the thoracic aorta. No definite airspace opacity. No evidence of pleural effusion or pneumothorax. Bones are demineralized. No apparent fractures. IMPRESSION: Limited exam.  No evidence of a acute cardiopulmonary abnormality. Electronically Signed   By: Davina Poke D.O.   On: 04/15/2020 15:24      Subjective: "It tastes good."  Discharge Exam: Vitals:   04/18/20 0526 04/18/20 0719  BP: (!) 147/78 (!) 157/86  Pulse: 78 81  Resp: 17 16  Temp: 98.8 F (37.1 C) 98.9 F (37.2 C)  SpO2: 95% 97%   Vitals:   04/17/20 2356 04/18/20 0100 04/18/20 0526 04/18/20 0719  BP: 131/72  (!) 147/78 (!) 157/86  Pulse: 77 81 78 81  Resp: 17 18 17 16   Temp: 100.1 F (37.8 C)  98.8 F (37.1 C) 98.9 F (37.2 C)  TempSrc: Oral  Oral Oral  SpO2: 96% 96% 95% 97%  Weight:   59.4 kg   Height:        General: 86  y.o. female resting in bed in NAD, more energetic toxday Cardiovascular: RRR, +S1, S2, no m/g/r, equal pulses throughout Respiratory: CTABL, no w/r/r, normal WOB GI: BS+, NDNT, no masses noted, no organomegaly noted MSK: No e/c/c Neuro: A&O x 2 (name, place), no focal deficits, more interactive today   The results of significant diagnostics from this hospitalization (including imaging, microbiology, ancillary and laboratory) are listed below for reference.     Microbiology: Recent Results (from the past 240 hour(s))  Respiratory Panel by RT PCR (Flu A&B, Covid) - Nasopharyngeal Swab     Status: None   Collection Time: 04/15/20  2:24 PM   Specimen: Nasopharyngeal Swab  Result Value Ref Range Status   SARS Coronavirus 2 by RT PCR NEGATIVE NEGATIVE Final    Comment: (NOTE) SARS-CoV-2 target nucleic acids are NOT DETECTED.  The SARS-CoV-2 RNA is generally detectable in upper respiratoy specimens during the acute phase of infection. The lowest concentration of SARS-CoV-2 viral copies this assay can detect is 131 copies/mL. A negative result does not preclude SARS-Cov-2 infection and should not be used as the sole basis for treatment or other patient management decisions. A negative result may occur with  improper specimen collection/handling, submission of specimen other than nasopharyngeal swab, presence of viral mutation(s) within the areas targeted by this assay, and inadequate number of viral copies (<131 copies/mL). A negative result must be combined with clinical observations, patient history, and epidemiological information. The expected result is Negative.  Fact Sheet for Patients:  PinkCheek.be  Fact Sheet for Healthcare Providers:  GravelBags.it  This test is no t yet approved or cleared by the [place] FDA and  has been authorized for detection and/or diagnosis of SARS-CoV-2 by FDA under an Emergency Use  Authorization (EUA). This EUA will remain  in effect (meaning this test can be used) for the duration of the COVID-19 declaration under Section 564(b)(1) of the Act, 21 U.S.C. section 360bbb-3(b)(1), unless the authorization is terminated or revoked sooner.     Influenza A by PCR NEGATIVE NEGATIVE Final   Influenza B by PCR NEGATIVE NEGATIVE Final    Comment: (NOTE) The Xpert Xpress SARS-CoV-2/FLU/RSV assay is intended as an aid in  the diagnosis of influenza from Nasopharyngeal swab specimens and  should not be used as a sole basis for treatment. Nasal washings and  aspirates are unacceptable for Xpert Xpress SARS-CoV-2/FLU/RSV  testing.  Fact Sheet for Patients: PinkCheek.be  Fact Sheet for Healthcare Providers: GravelBags.it  This test is not yet approved or cleared by the [place] FDA and  has been authorized for detection and/or diagnosis of SARS-CoV-2 by  FDA under an Emergency Use Authorization (EUA). This EUA will remain  in effect (meaning this test can be used) for the duration of the  Covid-19 declaration under Section 564(b)(1) of the Act, 21  U.S.C. section 360bbb-3(b)(1), unless the authorization is  terminated or revoked. Performed at [facility], [place] [address] [place], [place] [address]   Urine culture     Status: Abnormal   Collection Time: 04/15/20  9:59 PM   Specimen: Urine, Clean Catch  Result Value Ref Range Status   Specimen Description URINE, CLEAN CATCH  Final   Special Requests   Final    NONE Performed at [facility], [place] [address], [place], [place] [address]    Culture MULTIPLE SPECIES PRESENT, SUGGEST RECOLLECTION (A)  Final   Report Status 04/17/2020 FINAL  Final  MRSA PCR Screening     Status: None   Collection Time: 04/15/20 10:11 PM   Specimen: Nasal Mucosa; Nasopharyngeal  Result Value Ref Range Status   MRSA by PCR NEGATIVE NEGATIVE Final    Comment:         The GeneXpert MRSA Assay (FDA approved for NASAL specimens only), is one component of a comprehensive MRSA colonization surveillance program. It is not intended to diagnose MRSA infection nor to guide or monitor treatment for MRSA infections. Performed at [facility], [place] [address] [place], [place] [address]      Labs: BNP (last 3 results) No results for input(s): BNP in the last 8760 hours. Basic Metabolic Panel: Recent Labs  Lab 04/15/20 1304 04/16/20 0336 04/17/20 0849 04/18/20 0811  NA 143 141 139 143  K 4.5 4.1 4.1 3.6  CL 109 107  106 107  CO2 26 27 23 27   GLUCOSE 76 78 63* 81  BUN 13 10 11 9   CREATININE 0.82 0.74 0.81 0.79  CALCIUM 9.2 8.8* 8.7* 8.7*  MG  --   --  1.8  --   PHOS  --   --  2.5 2.7   Liver Function Tests: Recent Labs  Lab 04/15/20 1304 04/17/20 0849 04/18/20 0811  AST 19  --   --   ALT 13  --   --   ALKPHOS 63  --   --   BILITOT 0.5  --   --   PROT 6.5  --   --   ALBUMIN 2.9* 2.5* 2.4*   No results for input(s): LIPASE, AMYLASE in the last 168 hours. Recent Labs  Lab 04/15/20 1304  AMMONIA 31   CBC: Recent Labs  Lab 04/15/20 1304 04/16/20 0336 04/17/20 0849 04/18/20 0811  WBC 4.4 5.4 5.6 5.5  NEUTROABS 1.8  --  3.5 3.0  HGB 11.5* 10.7* 11.2* 10.5*  HCT 38.6 34.2* 36.1 33.4*  MCV 101.0* 95.8 95.5 94.4  PLT 238 259 228 232   Cardiac Enzymes: No results for input(s): CKTOTAL, CKMB, CKMBINDEX, TROPONINI in the last 168 hours. BNP: Invalid input(s): POCBNP CBG: Recent Labs  Lab 04/15/20 1248 04/15/20 1505 04/16/20 0940  GLUCAP 73 79 73   D-Dimer No results for input(s): DDIMER in the last 72 hours. Hgb A1c No results for input(s): HGBA1C in the last 72 hours. Lipid Profile No results for input(s): CHOL, HDL, LDLCALC, TRIG, CHOLHDL, LDLDIRECT in the last 72 hours. Thyroid function studies Recent Labs    04/15/20 1304  TSH 3.118   Anemia work up No results for input(s): VITAMINB12, FOLATE, FERRITIN,  TIBC, IRON, RETICCTPCT in the last 72 hours. Urinalysis    Component Value Date/Time   COLORURINE YELLOW 04/15/2020 2159   APPEARANCEUR CLEAR 04/15/2020 2159   LABSPEC 1.011 04/15/2020 2159   PHURINE 5.0 04/15/2020 2159   GLUCOSEU NEGATIVE 04/15/2020 2159   GLUCOSEU NEGATIVE 06/03/2013 1035   HGBUR NEGATIVE 04/15/2020 2159   BILIRUBINUR NEGATIVE 04/15/2020 2159   BILIRUBINUR 1+ 07/26/2016 1027   KETONESUR NEGATIVE 04/15/2020 2159   PROTEINUR NEGATIVE 04/15/2020 2159   UROBILINOGEN 1.0 07/26/2016 1027   UROBILINOGEN 0.2 03/13/2015 1255   NITRITE NEGATIVE 04/15/2020 2159   LEUKOCYTESUR MODERATE (A) 04/15/2020 2159   Sepsis Labs Invalid input(s): PROCALCITONIN,  WBC,  LACTICIDVEN Microbiology Recent Results (from the past 240 hour(s))  Respiratory Panel by RT PCR (Flu A&B, Covid) - Nasopharyngeal Swab     Status: None   Collection Time: 04/15/20  2:24 PM   Specimen: Nasopharyngeal Swab  Result Value Ref Range Status   SARS Coronavirus 2 by RT PCR NEGATIVE NEGATIVE Final    Comment: (NOTE) SARS-CoV-2 target nucleic acids are NOT DETECTED.  The SARS-CoV-2 RNA is generally detectable in upper respiratoy specimens during the acute phase of infection. The lowest concentration of SARS-CoV-2 viral copies this assay can detect is 131 copies/mL. A negative result does not preclude SARS-Cov-2 infection and should not be used as the sole basis for treatment or other patient management decisions. A negative result may occur with  improper specimen collection/handling, submission of specimen other than nasopharyngeal swab, presence of viral mutation(s) within the areas targeted by this assay, and inadequate number of viral copies (<131 copies/mL). A negative result must be combined with clinical observations, patient history, and epidemiological information. The expected result is Negative.  Fact Sheet for Patients:  PinkCheek.be  Fact Sheet for Healthcare  Providers:  GravelBags.it  This test is no t yet approved or cleared by the [place] FDA and  has been authorized for detection and/or diagnosis of SARS-CoV-2 by FDA under an Emergency Use Authorization (EUA). This EUA will remain  in effect (meaning this test can be used) for the duration of the COVID-19 declaration under Section 564(b)(1) of the Act, 21 U.S.C. section 360bbb-3(b)(1), unless the authorization is terminated or revoked sooner.     Influenza A by PCR NEGATIVE NEGATIVE Final   Influenza B by PCR NEGATIVE NEGATIVE Final    Comment: (NOTE) The Xpert Xpress SARS-CoV-2/FLU/RSV assay is intended as an aid in  the diagnosis of influenza from Nasopharyngeal swab specimens and  should not be used as a sole basis for treatment. Nasal washings and  aspirates are unacceptable for Xpert Xpress SARS-CoV-2/FLU/RSV  testing.  Fact Sheet for Patients: PinkCheek.be  Fact Sheet for Healthcare Providers: GravelBags.it  This test is not yet approved or cleared by the [place] FDA and  has been authorized for detection and/or diagnosis of SARS-CoV-2 by  FDA under an Emergency Use Authorization (EUA). This EUA will remain  in effect (meaning this test can be used) for the duration of the  Covid-19 declaration under Section 564(b)(1) of the Act, 21  U.S.C. section 360bbb-3(b)(1), unless the authorization is  terminated or revoked. Performed at [facility], [place] [address] [place], [place] [address]   Urine culture     Status: Abnormal   Collection Time: 04/15/20  9:59 PM   Specimen: Urine, Clean Catch  Result Value Ref Range Status   Specimen Description URINE, CLEAN CATCH  Final   Special Requests   Final    NONE Performed at [facility], [place] [address], [place], [place] [address]    Culture MULTIPLE SPECIES PRESENT, SUGGEST RECOLLECTION (A)  Final   Report Status  04/17/2020 FINAL  Final  MRSA PCR Screening     Status: None   Collection Time: 04/15/20 10:11 PM   Specimen: Nasal Mucosa; Nasopharyngeal  Result Value Ref Range Status   MRSA by PCR NEGATIVE NEGATIVE Final    Comment:        The GeneXpert MRSA Assay (FDA approved for NASAL specimens only), is one component of a comprehensive MRSA colonization surveillance program. It is not intended to diagnose MRSA infection nor to guide or monitor treatment for MRSA infections. Performed at [facility], [place] [address], [place], [place] [address]      Time coordinating discharge: 35 minutes  SIGNED:   Jonnie Finner, DO  [facility] 04/18/2020, 11:20 AM   If 7PM-7AM, please contact night-coverage [email or web address]

## 2020-06-20 ENCOUNTER — Emergency Department (HOSPITAL_COMMUNITY)
Admission: EM | Admit: 2020-06-20 | Discharge: 2020-06-20 | Disposition: A | Discharge status: Home / Self Care | Attending: Emergency Medicine | Admitting: Emergency Medicine

## 2020-06-20 ENCOUNTER — Other Ambulatory Visit: Payer: Self-pay

## 2020-06-20 ENCOUNTER — Emergency Department (HOSPITAL_COMMUNITY)

## 2020-06-20 ENCOUNTER — Encounter (HOSPITAL_COMMUNITY): Payer: Self-pay | Admitting: Radiology

## 2020-06-20 DIAGNOSIS — Z91010 Allergy to peanuts: Secondary | ICD-10-CM | POA: Insufficient documentation

## 2020-06-20 DIAGNOSIS — Z79899 Other long term (current) drug therapy: Secondary | ICD-10-CM | POA: Insufficient documentation

## 2020-06-20 DIAGNOSIS — J449 Chronic obstructive pulmonary disease, unspecified: Secondary | ICD-10-CM | POA: Insufficient documentation

## 2020-06-20 DIAGNOSIS — Z043 Encounter for examination and observation following other accident: Secondary | ICD-10-CM | POA: Insufficient documentation

## 2020-06-20 DIAGNOSIS — W050XXA Fall from non-moving wheelchair, initial encounter: Secondary | ICD-10-CM | POA: Diagnosis not present

## 2020-06-20 DIAGNOSIS — F0390 Unspecified dementia without behavioral disturbance: Secondary | ICD-10-CM | POA: Insufficient documentation

## 2020-06-20 DIAGNOSIS — W19XXXA Unspecified fall, initial encounter: Secondary | ICD-10-CM

## 2020-06-20 DIAGNOSIS — Y92129 Unspecified place in nursing home as the place of occurrence of the external cause: Secondary | ICD-10-CM | POA: Insufficient documentation

## 2020-06-20 DIAGNOSIS — E039 Hypothyroidism, unspecified: Secondary | ICD-10-CM | POA: Insufficient documentation

## 2020-06-20 DIAGNOSIS — I10 Essential (primary) hypertension: Secondary | ICD-10-CM | POA: Insufficient documentation

## 2020-06-20 DIAGNOSIS — S42031A Displaced fracture of lateral end of right clavicle, initial encounter for closed fracture: Secondary | ICD-10-CM

## 2020-06-20 LAB — CBC WITH DIFFERENTIAL/PLATELET
Abs Immature Granulocytes: 0.03 10*3/uL (ref 0.00–0.07)
Basophils Absolute: 0.1 10*3/uL (ref 0.0–0.1)
Basophils Relative: 1 %
Eosinophils Absolute: 0.1 10*3/uL (ref 0.0–0.5)
Eosinophils Relative: 3 %
HCT: 39.4 % (ref 36.0–46.0)
Hemoglobin: 12.2 g/dL (ref 12.0–15.0)
Immature Granulocytes: 1 %
Lymphocytes Relative: 24 %
Lymphs Abs: 1.1 10*3/uL (ref 0.7–4.0)
MCH: 30.6 pg (ref 26.0–34.0)
MCHC: 31 g/dL (ref 30.0–36.0)
MCV: 98.7 fL (ref 80.0–100.0)
Monocytes Absolute: 0.3 10*3/uL (ref 0.1–1.0)
Monocytes Relative: 8 %
Neutro Abs: 2.7 10*3/uL (ref 1.7–7.7)
Neutrophils Relative %: 63 %
Platelets: 280 10*3/uL (ref 150–400)
RBC: 3.99 MIL/uL (ref 3.87–5.11)
RDW: 13.6 % (ref 11.5–15.5)
WBC: 4.3 10*3/uL (ref 4.0–10.5)
nRBC: 0 % (ref 0.0–0.2)

## 2020-06-20 LAB — URINALYSIS, ROUTINE W REFLEX MICROSCOPIC
Bilirubin Urine: NEGATIVE
Glucose, UA: NEGATIVE mg/dL
Ketones, ur: NEGATIVE mg/dL
Leukocytes,Ua: NEGATIVE
Nitrite: NEGATIVE
Protein, ur: NEGATIVE mg/dL
RBC / HPF: >50 RBC/hpf — ABNORMAL HIGH (ref 0–5)
Specific Gravity, Urine: 1.03 (ref 1.005–1.030)
pH: 6 (ref 5.0–8.0)

## 2020-06-20 LAB — BASIC METABOLIC PANEL
Anion gap: 12 (ref 5–15)
BUN: 16 mg/dL (ref 8–23)
CO2: 26 mmol/L (ref 22–32)
Calcium: 9.3 mg/dL (ref 8.9–10.3)
Chloride: 107 mmol/L (ref 98–111)
Creatinine, Ser: 0.85 mg/dL (ref 0.44–1.00)
GFR, Estimated: >60 mL/min (ref >60)
Glucose, Bld: 83 mg/dL (ref 70–99)
Potassium: 4.4 mmol/L (ref 3.5–5.1)
Sodium: 145 mmol/L (ref 135–145)

## 2020-06-20 NOTE — ED Notes (Signed)
Ortho called. Stated they were on their way.

## 2020-06-20 NOTE — ED Notes (Signed)
RN called [facility] to take patient back to [facility].

## 2020-06-20 NOTE — ED Triage Notes (Signed)
Pt BIBA from [facility]-  Per EMS- Pt with witnessed fall where she fell out of wheelchair and landed prone on ground, hitting her face.  Pt with no visualized facial trauma on arrival to ED.  Pt nonverbal at baseline. Pt does not take thinners, wheelchair bound at baseline.

## 2020-06-20 NOTE — ED Notes (Addendum)
Pt was transferred without her paperwork or personal belongings. Will call [facility] to let them know.

## 2020-06-20 NOTE — ED Triage Notes (Signed)
Please call son, POA, regarding patient's care plan-[phone]

## 2020-06-20 NOTE — ED Notes (Signed)
Pt transported to CT ?

## 2020-06-20 NOTE — ED Provider Notes (Addendum)
[facility] DEPT Provider Note   CSN: [id number] Arrival date & time: 06/20/20  0848     History Chief Complaint  Patient presents with  . Fall    Kathryn Whitaker is a 86 y.o. female.  Presents to ER after fall.  From nursing facility, fell from wheelchair forward towards ground.  Unsure of any major trauma.  Patient not complaining of any pain.  Additional history obtained from son, hospice agency.  Patient has advanced dementia, CVA; HTN, HLD currently enrolled in hospice care.  According to son and hospice nurse, per last visit on 12/22, patient has transition to comfort measures.  Son states he would like her checked out today.  HPI     Past Medical History:  Diagnosis Date  . Acute asthmatic bronchitis   . Allergic rhinitis   . Alzheimer's dementia ([place])   . Anxiety   . Chronic constipation   . Diverticulosis of colon   . DJD (degenerative joint disease)   . GERD (gastroesophageal reflux disease)   . History of colonic polyps   . Hypercholesteremia   . Hypertension   . IBS (irritable bowel syndrome)   . Lacunar infarction ([place])   . Lumbar back pain   . Malnutrition ([place])   . UTI (lower urinary tract infection)   . Vitamin D deficiency     Patient Active Problem List   Diagnosis Date Noted  . Pressure injury of skin 04/16/2020  . Fall at home, initial encounter 04/15/2020  . Palliative care by specialist   . Hospice care patient   . Acute urinary retention 01/30/2020  . Dehydration 01/30/2020  . UTI (urinary tract infection) 01/29/2020  . Fecal impaction ([place]) 01/29/2020  . Acute metabolic encephalopathy 0000000  . AMS (altered mental status) 04/27/2019  . Hallucinations   . Acute blood loss anemia 01/15/2017  . Hip fracture, left ([place]) 01/12/2017  . Hip fracture ([place]) 01/12/2017  . S/p left hip fracture 01/12/2017  . Dementia with psychosis ([place]) 07/26/2016  . Dysuria 08/10/2015  . Hypothyroidism 06/06/2015  .  Protein-calorie malnutrition, severe ([place]) 06/26/2014  . Constipation 12/17/2013  . COPD (chronic obstructive pulmonary disease) ([place]) 06/03/2013  . IBS (irritable bowel syndrome) 12/30/2012  . Generalized anxiety disorder 12/30/2012  . Dyslipidemia 12/12/2012  . HTN (hypertension) 12/12/2012  . Hx of completed stroke 07/11/2009    Past Surgical History:  Procedure Laterality Date  . ABDOMINAL HYSTERECTOMY    . FEMUR IM NAIL Left 01/13/2017   Procedure: TROCHANTERIC INTRAMEDULLARY (IM) NAIL FEMORAL;  Surgeon: Renette Butters, MD;  Location: [facility];  Service: Orthopedics;  Laterality: Left;     OB History   No obstetric history on file.     Family History  Problem Relation Age of Onset  . Hypertension Other     Social History   Tobacco Use  . Smoking status: Never Smoker  . Smokeless tobacco: Never Used  Vaping Use  . Vaping Use: Never used  Substance Use Topics  . Alcohol use: No    Alcohol/week: 0.0 standard drinks  . Drug use: No    Home Medications Prior to Admission medications   Medication Sig Start Date End Date Taking? Authorizing Provider  acetaminophen (TYLENOL) 500 MG tablet Take 500 mg by mouth every 4 (four) hours as needed for mild pain, fever or headache.    [provider]  alum & mag hydroxide-simeth (MINTOX REGULAR STRENGTH) 200-200-20 MG/5ML suspension Take 30 mLs by mouth every 6 (six)  hours as needed for indigestion or heartburn.    [provider]  Chloroxylenol-Zinc Oxide (BAZA EX) Apply 1 application topically See admin instructions. Apply to buttocks bid and after each incontinence episode    [provider]  feeding supplement (ENSURE ENLIVE / ENSURE PLUS) LIQD Take 237 mLs by mouth 3 (three) times daily between meals. 04/18/20   Cherylann Ratel A, DO  guaifenesin (ROBITUSSIN) 100 MG/5ML syrup Take 200 mg by mouth every 6 (six) hours as needed for cough.    [provider]  haloperidol (HALDOL) 2 MG/ML solution  Take 0.5 mg by mouth in the morning, at noon, in the evening, and at bedtime.     [provider]  lactulose (CHRONULAC) 10 GM/15ML solution Take 30 g by mouth 2 (two) times daily as needed for mild constipation.    [provider]  lamoTRIgine (LAMICTAL) 25 MG tablet Take 3 tablets (75 mg total) by mouth 2 (two) times daily. Patient taking differently: Take 50 mg by mouth 2 (two) times daily.  04/08/17   Ethelene Hal, NP  levothyroxine (SYNTHROID, LEVOTHROID) 50 MCG tablet TAKE 1 TABLET (50 MCG TOTAL) BY MOUTH DAILY BEFORE BREAKFAST. 01/06/17   Golden Circle, FNP  loperamide (IMODIUM) 2 MG capsule Take 2 mg by mouth as needed for diarrhea or loose stools.    [provider]  magnesium hydroxide (MILK OF MAGNESIA) 400 MG/5ML suspension Take 30 mLs by mouth at bedtime as needed for mild constipation.    [provider]  neomycin-bacitracin-polymyxin (NEOSPORIN) [phone] ointment Apply 1 application topically daily as needed (skin abrasions).    [provider]  PRESCRIPTION MEDICATION Take 1 each by mouth in the morning, at noon, and at bedtime. Mighty shake    [provider]  senna (SENOKOT) 8.6 MG TABS tablet Take 2 tablets by mouth 2 (two) times daily.     [provider]  trolamine salicylate (ASPERCREME) 10 % cream Apply 1 application topically in the morning and at bedtime. Right knee    [provider]    Allergies    Demerol [meperidine], Influenza vaccines, Amoxicillin, Clarithromycin, Morphine and related, Peanuts [peanut oil], Prevpac [amoxicill-clarithro-lansopraz], and Telithromycin  Review of Systems   Review of Systems  Unable to perform ROS: Dementia    Physical Exam Updated Vital Signs BP (!) 158/83   Pulse 62   Temp (!) 96.6 F (35.9 C) (Axillary)   Resp 18   SpO2 100%   Physical Exam Vitals and nursing note reviewed.  Constitutional:      General: She is not in acute distress.     Appearance: She is well-developed and well-nourished.     Comments: Chronically ill appearing, frail, elderly  HENT:     Head: Normocephalic and atraumatic.  Eyes:     Conjunctiva/sclera: Conjunctivae normal.  Neck:     Comments: C-collar intact Cardiovascular:     Rate and Rhythm: Normal rate and regular rhythm.     Heart sounds: No murmur heard.   Pulmonary:     Effort: Pulmonary effort is normal. No respiratory distress.     Breath sounds: Normal breath sounds.  Abdominal:     Palpations: Abdomen is soft.     Tenderness: There is no abdominal tenderness.  Musculoskeletal:        General: No edema.     Comments: Back: T, L spine TTP, no step off or deformity RUE: TTP in R shoulder, radial pulse intact, distal sensation and motor  intact LUE: no TTP throughout, no deformity, normal joint ROM, radial pulse intact, distal sensation and motor intact RLE:  no TTP throughout, no deformity, normal joint ROM, distal pulse, sensation and motor intact LLE: no TTP throughout, no deformity, normal joint ROM, distal pulse, sensation and motor intact  Skin:    General: Skin is warm and dry.  Neurological:     Mental Status: She is alert.     GCS: GCS eye subscore is 3. GCS verbal subscore is 4. GCS motor subscore is 6.     Comments: Follows basic commands, moves all 4 extremities  Psychiatric:        Mood and Affect: Mood and affect normal.     ED Results / Procedures / Treatments   Labs (all labs ordered are listed, but only abnormal results are displayed) Labs Reviewed  URINALYSIS, ROUTINE W REFLEX MICROSCOPIC - Abnormal; Notable for the following components:      Result Value   Hgb urine dipstick LARGE (*)    RBC / HPF >50 (*)    Bacteria, UA RARE (*)    All other components within normal limits  CBC WITH DIFFERENTIAL/PLATELET  BASIC METABOLIC PANEL    EKG None  Radiology CT Head Wo Contrast  Result Date: 06/20/2020 CLINICAL DATA:  Trauma.  Fall. EXAM: CT HEAD WITHOUT  CONTRAST CT CERVICAL SPINE WITHOUT CONTRAST TECHNIQUE: Multidetector CT imaging of the head and cervical spine was performed following the standard protocol without intravenous contrast. Multiplanar CT image reconstructions of the cervical spine were also generated. COMPARISON:  CT head 04/15/2020. Chest radiograph from August 31, 2017 FINDINGS: CT HEAD FINDINGS Brain: No evidence of acute large vascular territory infarction, hemorrhage, hydrocephalus, extra-axial collection or mass lesion/mass effect. Patchy white matter hypoattenuation, most likely related to chronic microvascular ischemic disease. Remote lacunar infarcts in the right cerebellum. Additional remote lacunar infarct versus dilated perivascular space in the inferior right basal ganglia. Similar diffuse cerebral atrophy with ex vacuo ventricular dilation. Vascular: Calcific atherosclerosis. Skull: Right frontal scalp contusion without acute fracture Sinuses/Orbits: Mild paranasal sinus mucosal thickening without air-fluid levels. Other: No mastoid effusions. CT CERVICAL SPINE FINDINGS Alignment: No substantial sagittal subluxation. Broad dextrocurvature. Skull base and vertebrae: Partially imaged T4 compression fracture with approximately 50-60% height loss. Although poorly visualized, there appears to be similar height loss of this vertebral body on prior chest radiograph from August 31, 2017. Osteopenia. Soft tissues and spinal canal: No prevertebral fluid or swelling. No visible canal hematoma. Disc levels: Mild multilevel degenerative disc disease and facet arthropathy. Upper chest: Biapical pleuroparenchymal scarring. No acute abnormality. Calcified granuloma in the left upper lobe. IMPRESSION: CT head: 1. No evidence of acute intracranial abnormality. 2. Right frontal scalp contusion without acute fracture. CT cervical spine: 1. No convincing acute fracture.  No malalignment. 2. Partially imaged T4 compression fracture, which is likely remote given  apparent height loss of this vertebral body on prior chest radiograph from August 31, 2017 (poorly visualized on that study). Recommend correlation with the presence or absence of point tenderness. Electronically Signed   By: Feliberto HartsFrederick S Jones MD   On: 06/20/2020 10:43   CT Cervical Spine Wo Contrast  Result Date: 06/20/2020 CLINICAL DATA:  Trauma.  Fall. EXAM: CT HEAD WITHOUT CONTRAST CT CERVICAL SPINE WITHOUT CONTRAST TECHNIQUE: Multidetector CT imaging of the head and cervical spine was performed following the standard protocol without intravenous contrast. Multiplanar CT image reconstructions of the cervical spine were also generated. COMPARISON:  CT head 04/15/2020. Chest radiograph  from August 31, 2017 FINDINGS: CT HEAD FINDINGS Brain: No evidence of acute large vascular territory infarction, hemorrhage, hydrocephalus, extra-axial collection or mass lesion/mass effect. Patchy white matter hypoattenuation, most likely related to chronic microvascular ischemic disease. Remote lacunar infarcts in the right cerebellum. Additional remote lacunar infarct versus dilated perivascular space in the inferior right basal ganglia. Similar diffuse cerebral atrophy with ex vacuo ventricular dilation. Vascular: Calcific atherosclerosis. Skull: Right frontal scalp contusion without acute fracture Sinuses/Orbits: Mild paranasal sinus mucosal thickening without air-fluid levels. Other: No mastoid effusions. CT CERVICAL SPINE FINDINGS Alignment: No substantial sagittal subluxation. Broad dextrocurvature. Skull base and vertebrae: Partially imaged T4 compression fracture with approximately 50-60% height loss. Although poorly visualized, there appears to be similar height loss of this vertebral body on prior chest radiograph from August 31, 2017. Osteopenia. Soft tissues and spinal canal: No prevertebral fluid or swelling. No visible canal hematoma. Disc levels: Mild multilevel degenerative disc disease and facet arthropathy.  Upper chest: Biapical pleuroparenchymal scarring. No acute abnormality. Calcified granuloma in the left upper lobe. IMPRESSION: CT head: 1. No evidence of acute intracranial abnormality. 2. Right frontal scalp contusion without acute fracture. CT cervical spine: 1. No convincing acute fracture.  No malalignment. 2. Partially imaged T4 compression fracture, which is likely remote given apparent height loss of this vertebral body on prior chest radiograph from August 31, 2017 (poorly visualized on that study). Recommend correlation with the presence or absence of point tenderness. Electronically Signed   By: Feliberto Harts MD   On: 06/20/2020 10:43    Procedures Procedures (including critical care time)  Medications Ordered in ED Medications - No data to display  ED Course  I have reviewed the triage vital signs and the nursing notes.  Pertinent labs & imaging results that were available during my care of the patient were reviewed by me and considered in my medical decision making (see chart for details).  Clinical Course as of 06/20/20 1149  Tue Jun 20, 2020  0943 [facility] [RD]  [phone] Discussed with son [RD]  [phone] [RD]  1018 D/w amy johnson - current state is baseline - waxes and wanes frequently, suspected from dementia and psych disorder, currently goal is comfort care [RD]    Clinical Course User Index [RD] Milagros Loll, MD   MDM Rules/Calculators/A&P                         86 year old lady enrolled in hospice care, living at a nursing facility presented to ER after fall from wheelchair.  No significant trauma identified on my exam.  CT head and C-spine were negative for any acute pathology, possibly has remote T-spine fracture.  No point tenderness.  Doubt acute. XR of right shoulder concerning for distal clavicle fracture. No skin tenting. Will place in sling. Will need out pt ortho f/u. Discussed finding with son via phone. Basic labs are  within normal limits.  Will discharge back to facility.  Final Clinical Impression(s) / ED Diagnoses Final diagnoses:  Fall, initial encounter  Closed displaced fracture of acromial end of right clavicle, initial encounter    Rx / DC Orders ED Discharge Orders    None       Milagros Loll, MD 06/20/20 1153    Milagros Loll, MD 06/21/20 1504

## 2020-06-20 NOTE — Discharge Instructions (Addendum)
Follow-up with primary doctor.  Continue current care at nursing facility.  Use sling for your right clavicle fracture.  Follow-up with orthopedic surgery.

## 2020-08-15 ENCOUNTER — Emergency Department (HOSPITAL_COMMUNITY)

## 2020-08-15 ENCOUNTER — Other Ambulatory Visit: Payer: Self-pay

## 2020-08-15 ENCOUNTER — Encounter (HOSPITAL_COMMUNITY): Payer: Self-pay

## 2020-08-15 ENCOUNTER — Emergency Department (HOSPITAL_COMMUNITY)
Admission: EM | Admit: 2020-08-15 | Discharge: 2020-08-15 | Disposition: A | Discharge status: Home / Self Care | Attending: Emergency Medicine | Admitting: Emergency Medicine

## 2020-08-15 DIAGNOSIS — Z91010 Allergy to peanuts: Secondary | ICD-10-CM | POA: Diagnosis not present

## 2020-08-15 DIAGNOSIS — J449 Chronic obstructive pulmonary disease, unspecified: Secondary | ICD-10-CM | POA: Diagnosis not present

## 2020-08-15 DIAGNOSIS — G309 Alzheimer's disease, unspecified: Secondary | ICD-10-CM | POA: Diagnosis not present

## 2020-08-15 DIAGNOSIS — E039 Hypothyroidism, unspecified: Secondary | ICD-10-CM | POA: Insufficient documentation

## 2020-08-15 DIAGNOSIS — I10 Essential (primary) hypertension: Secondary | ICD-10-CM | POA: Insufficient documentation

## 2020-08-15 DIAGNOSIS — W1830XA Fall on same level, unspecified, initial encounter: Secondary | ICD-10-CM | POA: Diagnosis not present

## 2020-08-15 DIAGNOSIS — Z79899 Other long term (current) drug therapy: Secondary | ICD-10-CM | POA: Insufficient documentation

## 2020-08-15 DIAGNOSIS — S0990XA Unspecified injury of head, initial encounter: Secondary | ICD-10-CM | POA: Diagnosis not present

## 2020-08-15 DIAGNOSIS — W19XXXA Unspecified fall, initial encounter: Secondary | ICD-10-CM

## 2020-08-15 DIAGNOSIS — F0280 Dementia in other diseases classified elsewhere without behavioral disturbance: Secondary | ICD-10-CM | POA: Insufficient documentation

## 2020-08-15 MED ORDER — ZIPRASIDONE MESYLATE 20 MG IM SOLR
10.0000 mg | Freq: Once | INTRAMUSCULAR | Status: AC
  Administered 2020-08-15: 10 mg via INTRAMUSCULAR
  Filled 2020-08-15: qty 20

## 2020-08-15 MED ORDER — STERILE WATER FOR INJECTION IJ SOLN
INTRAMUSCULAR | Status: AC
  Administered 2020-08-15: 10 mL
  Filled 2020-08-15: qty 10

## 2020-08-15 NOTE — ED Notes (Signed)
Attempted to call [facility] to give report, no answer and the mailbox was full. Will attempt to call report again later.

## 2020-08-15 NOTE — ED Notes (Addendum)
Played gospel music in room for comfort

## 2020-08-15 NOTE — ED Provider Notes (Signed)
[facility] DEPT Provider Note   CSN: [id number] Arrival date & time: 08/15/20  1256     History Chief Complaint  Patient presents with  . Fall    Kathryn Whitaker is a 87 y.o. female.  Pt presents to the ED today with a fall.  Pt has a hx of multiple falls.  The pt did fall today while at the SNF.  The pt has severe dementia and is unable to give any hx.         Past Medical History:  Diagnosis Date  . Acute asthmatic bronchitis   . Allergic rhinitis   . Alzheimer's dementia ([place])   . Anxiety   . Chronic constipation   . Diverticulosis of colon   . DJD (degenerative joint disease)   . GERD (gastroesophageal reflux disease)   . History of colonic polyps   . Hypercholesteremia   . Hypertension   . IBS (irritable bowel syndrome)   . Lacunar infarction ([place])   . Lumbar back pain   . Malnutrition ([place])   . UTI (lower urinary tract infection)   . Vitamin D deficiency     Patient Active Problem List   Diagnosis Date Noted  . Pressure injury of skin 04/16/2020  . Fall at home, initial encounter 04/15/2020  . Palliative care by specialist   . Hospice care patient   . Acute urinary retention 01/30/2020  . Dehydration 01/30/2020  . UTI (urinary tract infection) 01/29/2020  . Fecal impaction ([place]) 01/29/2020  . Acute metabolic encephalopathy 93/71/6967  . AMS (altered mental status) 04/27/2019  . Hallucinations   . Acute blood loss anemia 01/15/2017  . Hip fracture, left ([place]) 01/12/2017  . Hip fracture ([place]) 01/12/2017  . S/p left hip fracture 01/12/2017  . Dementia with psychosis ([place]) 07/26/2016  . Dysuria 08/10/2015  . Hypothyroidism 06/06/2015  . Protein-calorie malnutrition, severe ([place]) 06/26/2014  . Constipation 12/17/2013  . COPD (chronic obstructive pulmonary disease) ([place]) 06/03/2013  . IBS (irritable bowel syndrome) 12/30/2012  . Generalized anxiety disorder 12/30/2012  . Dyslipidemia 12/12/2012  . HTN (hypertension)  12/12/2012  . Hx of completed stroke 07/11/2009    Past Surgical History:  Procedure Laterality Date  . ABDOMINAL HYSTERECTOMY    . FEMUR IM NAIL Left 01/13/2017   Procedure: TROCHANTERIC INTRAMEDULLARY (IM) NAIL FEMORAL;  Surgeon: Renette Butters, MD;  Location: [facility];  Service: Orthopedics;  Laterality: Left;     OB History   No obstetric history on file.     Family History  Problem Relation Age of Onset  . Hypertension Other     Social History   Tobacco Use  . Smoking status: Never Smoker  . Smokeless tobacco: Never Used  Vaping Use  . Vaping Use: Never used  Substance Use Topics  . Alcohol use: No    Alcohol/week: 0.0 standard drinks  . Drug use: No    Home Medications Prior to Admission medications   Medication Sig Start Date End Date Taking? Authorizing Provider  acetaminophen (TYLENOL) 500 MG tablet Take 500 mg by mouth every 4 (four) hours as needed for mild pain, fever or headache.    [provider]  alum & mag hydroxide-simeth (MINTOX REGULAR STRENGTH) 200-200-20 MG/5ML suspension Take 30 mLs by mouth every 6 (six) hours as needed for indigestion or heartburn.    [provider]  Chloroxylenol-Zinc Oxide (BAZA EX) Apply 1 application topically See admin instructions. Apply to buttocks bid and after each incontinence episode  [provider]  feeding supplement (ENSURE ENLIVE / ENSURE PLUS) LIQD Take 237 mLs by mouth 3 (three) times daily between meals. 04/18/20   Cherylann Ratel A, DO  guaifenesin (ROBITUSSIN) 100 MG/5ML syrup Take 200 mg by mouth every 6 (six) hours as needed for cough.    [provider]  haloperidol (HALDOL) 2 MG/ML solution Take 0.5 mg by mouth in the morning, at noon, in the evening, and at bedtime.     [provider]  lactulose (CHRONULAC) 10 GM/15ML solution Take 30 g by mouth 2 (two) times daily as needed for mild constipation.    [provider]  lamoTRIgine (LAMICTAL) 25 MG tablet  Take 3 tablets (75 mg total) by mouth 2 (two) times daily. Patient taking differently: Take 50 mg by mouth 2 (two) times daily.  04/08/17   Ethelene Hal, NP  levothyroxine (SYNTHROID, LEVOTHROID) 50 MCG tablet TAKE 1 TABLET (50 MCG TOTAL) BY MOUTH DAILY BEFORE BREAKFAST. 01/06/17   Golden Circle, FNP  loperamide (IMODIUM) 2 MG capsule Take 2 mg by mouth as needed for diarrhea or loose stools.    [provider]  magnesium hydroxide (MILK OF MAGNESIA) 400 MG/5ML suspension Take 30 mLs by mouth at bedtime as needed for mild constipation.    [provider]  neomycin-bacitracin-polymyxin (NEOSPORIN) [phone] ointment Apply 1 application topically daily as needed (skin abrasions).    [provider]  PRESCRIPTION MEDICATION Take 1 each by mouth in the morning, at noon, and at bedtime. Mighty shake    [provider]  senna (SENOKOT) 8.6 MG TABS tablet Take 2 tablets by mouth 2 (two) times daily.     [provider]  trolamine salicylate (ASPERCREME) 10 % cream Apply 1 application topically in the morning and at bedtime. Right knee    [provider]    Allergies    Demerol [meperidine], Influenza vaccines, Amoxicillin, Clarithromycin, Morphine and related, Peanuts [peanut oil], Prevpac [amoxicill-clarithro-lansopraz], and Telithromycin  Review of Systems   Review of Systems  Unable to perform ROS: Dementia  All other systems reviewed and are negative.   Physical Exam Updated Vital Signs BP 136/65   Pulse (!) 59   Temp (!) 95.6 F (35.3 C) (Rectal)   Resp 16   SpO2 99%   Physical Exam Vitals and nursing note reviewed.  Constitutional:      Appearance: Normal appearance.  HENT:     Head: Normocephalic.      Right Ear: External ear normal.     Left Ear: External ear normal.     Nose: Nose normal.     Mouth/Throat:     Mouth: Mucous membranes are moist.     Pharynx: Oropharynx is clear.  Eyes:     Extraocular  Movements: Extraocular movements intact.     Conjunctiva/sclera: Conjunctivae normal.     Pupils: Pupils are equal, round, and reactive to light.  Cardiovascular:     Rate and Rhythm: Normal rate and regular rhythm.     Pulses: Normal pulses.     Heart sounds: Normal heart sounds.  Pulmonary:     Effort: Pulmonary effort is normal.     Breath sounds: Normal breath sounds.  Abdominal:     General: Abdomen is flat. Bowel sounds are normal.     Palpations: Abdomen is soft.  Musculoskeletal:     Cervical back: Normal range of motion and neck supple.     Comments: Contractures of both arms and legs  Skin:    General: Skin is warm.     Capillary Refill: Capillary refill takes less than 2 seconds.  Neurological:     Mental Status: She is alert. Mental status is at baseline.  Psychiatric:     Comments: Unable to assess     ED Results / Procedures / Treatments   Labs (all labs ordered are listed, but only abnormal results are displayed) Labs Reviewed - No data to display  EKG None  Radiology DG Chest 2 View  Result Date: 08/15/2020 CLINICAL DATA:  Pain following fall EXAM: CHEST - 2 VIEW COMPARISON:  April 15, 2020 FINDINGS: Lungs are clear. The heart size and pulmonary vascularity are normal. No adenopathy. There is aortic atherosclerosis. Bones are osteoporotic. Evidence of old trauma involving the lateral right clavicle. There is degenerative change in each shoulder. IMPRESSION: Lungs clear. Heart size normal. Degenerative change in each shoulder with evidence of prior trauma involving the lateral right clavicle. Aortic Atherosclerosis (ICD10-I70.0). Electronically Signed   By: Lowella Grip III M.D.   On: 08/15/2020 14:18   DG Pelvis 1-2 Views  Result Date: 08/15/2020 CLINICAL DATA:  Pain following fall EXAM: PELVIS - 1-2 VIEW COMPARISON:  August 13, 2019 FINDINGS: There is screw and nail fixation on the left due to prior fracture. Alignment in the proximal left femur is  anatomic. Screw tip is in the proximal left femoral head. Bones are osteoporotic. No acute fracture or dislocation evident. Moderate symmetric narrowing of each hip joint noted. IMPRESSION: Stable postoperative change on the left. No acute fracture or dislocation. Symmetric narrowing each hip joint. Bones osteoporotic. Electronically Signed   By: Lowella Grip III M.D.   On: 08/15/2020 14:20   CT Head Wo Contrast  Result Date: 08/15/2020 CLINICAL DATA:  witnessed fall. Per EMS: Pt is at baseline. Pt has large hematoma to forehead. No blood thinners noted. EXAM: CT HEAD WITHOUT CONTRAST CT CERVICAL SPINE WITHOUT CONTRAST TECHNIQUE: Multidetector CT imaging of the head and cervical spine was performed following the standard protocol without intravenous contrast. Multiplanar CT image reconstructions of the cervical spine were also generated. COMPARISON:  CT cervical spine and head CT June 20, 2020. FINDINGS: CT HEAD FINDINGS Brain: No evidence of acute infarction, hemorrhage, hydrocephalus, extra-axial collection or mass lesion/mass effect. Patchy more confluent white matter hypoattenuation, most likely related to chronic microvascular ischemic disease. Remote lacunar type infarct in the right cerebellum and basal ganglia. Similar age-related global parenchymal volume loss with ex vacuo ventricular dilation. Vascular: No hyperdense vessel. Atherosclerotic vascular calcification. Skull: Normal. Negative for fracture or focal lesion. Sinuses/Orbits: No acute finding. Other: Small extra calvarial hematoma overlying the frontal bone. CT CERVICAL SPINE FINDINGS Alignment: Positional straightening of the normal cervical lordosis. No listhesis. Skull base and vertebrae: No acute fracture. No primary bone lesion or focal pathologic process. Diffuse demineralization of bone. Soft tissues and spinal canal: No prevertebral fluid or swelling. No visible canal hematoma. Disc levels: Multilevel degenerative changes spine  with disc space narrowing and uncovertebral/facet hypertrophy Upper chest: Biapical pleuroparenchymal scarring. No acute abnormality. Other: Atherosclerotic vascular calcifications. IMPRESSION: 1. No acute intracranial abnormality. 2. Small extra calvarial hematoma overlying the frontal bone. No underlying calvarial fracture. 3. No evidence of acute fracture or subluxation of the cervical spine. 4. Multilevel degenerative changes of the cervical spine. Electronically Signed   By: Dahlia Bailiff MD   On: 08/15/2020 14:08   CT Cervical Spine Wo Contrast  Result Date: 08/15/2020 CLINICAL DATA:  witnessed fall. Per EMS:  Pt is at baseline. Pt has large hematoma to forehead. No blood thinners noted. EXAM: CT HEAD WITHOUT CONTRAST CT CERVICAL SPINE WITHOUT CONTRAST TECHNIQUE: Multidetector CT imaging of the head and cervical spine was performed following the standard protocol without intravenous contrast. Multiplanar CT image reconstructions of the cervical spine were also generated. COMPARISON:  CT cervical spine and head CT June 20, 2020. FINDINGS: CT HEAD FINDINGS Brain: No evidence of acute infarction, hemorrhage, hydrocephalus, extra-axial collection or mass lesion/mass effect. Patchy more confluent white matter hypoattenuation, most likely related to chronic microvascular ischemic disease. Remote lacunar type infarct in the right cerebellum and basal ganglia. Similar age-related global parenchymal volume loss with ex vacuo ventricular dilation. Vascular: No hyperdense vessel. Atherosclerotic vascular calcification. Skull: Normal. Negative for fracture or focal lesion. Sinuses/Orbits: No acute finding. Other: Small extra calvarial hematoma overlying the frontal bone. CT CERVICAL SPINE FINDINGS Alignment: Positional straightening of the normal cervical lordosis. No listhesis. Skull base and vertebrae: No acute fracture. No primary bone lesion or focal pathologic process. Diffuse demineralization of bone. Soft  tissues and spinal canal: No prevertebral fluid or swelling. No visible canal hematoma. Disc levels: Multilevel degenerative changes spine with disc space narrowing and uncovertebral/facet hypertrophy Upper chest: Biapical pleuroparenchymal scarring. No acute abnormality. Other: Atherosclerotic vascular calcifications. IMPRESSION: 1. No acute intracranial abnormality. 2. Small extra calvarial hematoma overlying the frontal bone. No underlying calvarial fracture. 3. No evidence of acute fracture or subluxation of the cervical spine. 4. Multilevel degenerative changes of the cervical spine. Electronically Signed   By: Dahlia Bailiff MD   On: 08/15/2020 14:08    Procedures Procedures   Medications Ordered in ED Medications - No data to display  ED Course  I have reviewed the triage vital signs and the nursing notes.  Pertinent labs & imaging results that were available during my care of the patient were reviewed by me and considered in my medical decision making (see chart for details).    MDM Rules/Calculators/A&P                          No evidence of any fractures.  Pt is stable for d/c back to the SNF.  Return if worse. Final Clinical Impression(s) / ED Diagnoses Final diagnoses:  Fall, initial encounter  Minor head injury, initial encounter    Rx / DC Orders ED Discharge Orders    None       Isla Pence, MD 08/15/20 1432

## 2020-08-15 NOTE — ED Notes (Signed)
Pt daughter in room and will make son Lynann Bologna aware that pt is going back to SNF

## 2020-08-15 NOTE — ED Notes (Signed)
Attempted to call report to [facility]. No answer and voicemail box not set up

## 2020-08-15 NOTE — ED Notes (Signed)
Placed bare hugger warmer on patient

## 2020-08-15 NOTE — ED Notes (Signed)
Attempted to call [facility] to give report, no answer and the mailbox was full.Will attempt to call report again later.

## 2020-08-15 NOTE — ED Triage Notes (Signed)
Pt arrives via [facility] from [facility] for a witnessed fall. Per EMS: Pt is at baseline. Staff at facility reports the pt is at baseline in a reclined WC with contractures. Pt is a Hospice pt. Pt does not have a DNR. Pt has large hematoma to forehead. No blood thinners noted. Pt arrives in C-collar placed by EMS. Pt nonverbal at baseline.

## 2020-08-15 NOTE — ED Notes (Signed)
[facility] made aware of need for transport

## 2020-10-07 ENCOUNTER — Other Ambulatory Visit: Payer: Self-pay

## 2020-10-07 ENCOUNTER — Emergency Department (HOSPITAL_COMMUNITY)
Admission: EM | Admit: 2020-10-07 | Discharge: 2020-10-07 | Disposition: A | Discharge status: Home / Self Care | Attending: Emergency Medicine | Admitting: Emergency Medicine

## 2020-10-07 ENCOUNTER — Emergency Department (HOSPITAL_COMMUNITY)

## 2020-10-07 DIAGNOSIS — J449 Chronic obstructive pulmonary disease, unspecified: Secondary | ICD-10-CM | POA: Insufficient documentation

## 2020-10-07 DIAGNOSIS — J45909 Unspecified asthma, uncomplicated: Secondary | ICD-10-CM | POA: Diagnosis not present

## 2020-10-07 DIAGNOSIS — Z79899 Other long term (current) drug therapy: Secondary | ICD-10-CM | POA: Insufficient documentation

## 2020-10-07 DIAGNOSIS — I10 Essential (primary) hypertension: Secondary | ICD-10-CM | POA: Diagnosis not present

## 2020-10-07 DIAGNOSIS — G309 Alzheimer's disease, unspecified: Secondary | ICD-10-CM | POA: Diagnosis not present

## 2020-10-07 DIAGNOSIS — S0181XA Laceration without foreign body of other part of head, initial encounter: Secondary | ICD-10-CM | POA: Diagnosis not present

## 2020-10-07 DIAGNOSIS — W19XXXA Unspecified fall, initial encounter: Secondary | ICD-10-CM

## 2020-10-07 DIAGNOSIS — Z8673 Personal history of transient ischemic attack (TIA), and cerebral infarction without residual deficits: Secondary | ICD-10-CM | POA: Insufficient documentation

## 2020-10-07 DIAGNOSIS — F0280 Dementia in other diseases classified elsewhere without behavioral disturbance: Secondary | ICD-10-CM | POA: Diagnosis not present

## 2020-10-07 DIAGNOSIS — W050XXA Fall from non-moving wheelchair, initial encounter: Secondary | ICD-10-CM | POA: Diagnosis not present

## 2020-10-07 DIAGNOSIS — Z91010 Allergy to peanuts: Secondary | ICD-10-CM | POA: Insufficient documentation

## 2020-10-07 DIAGNOSIS — Y92129 Unspecified place in nursing home as the place of occurrence of the external cause: Secondary | ICD-10-CM | POA: Diagnosis not present

## 2020-10-07 DIAGNOSIS — S0993XA Unspecified injury of face, initial encounter: Secondary | ICD-10-CM | POA: Diagnosis present

## 2020-10-07 MED ORDER — LIDOCAINE HCL (PF) 1 % IJ SOLN
5.0000 mL | Freq: Once | INTRAMUSCULAR | Status: AC
  Administered 2020-10-07: 5 mL
  Filled 2020-10-07: qty 30

## 2020-10-07 NOTE — ED Provider Notes (Signed)
[facility] DEPT Provider Note   CSN: [id number] Arrival date & time: 10/07/20  1058     History Chief Complaint  Patient presents with  . Fall    Kathryn Whitaker is a 87 y.o. female.  The history is provided by the patient. History limited by: dementia.  Fall This is a new problem. The current episode started less than 1 hour ago. The problem occurs constantly. The problem has been resolved. Pertinent negatives include no chest pain, no abdominal pain and no shortness of breath. Nothing aggravates the symptoms. Nothing relieves the symptoms. She has tried nothing for the symptoms.       Past Medical History:  Diagnosis Date  . Acute asthmatic bronchitis   . Allergic rhinitis   . Alzheimer's dementia ([place])   . Anxiety   . Chronic constipation   . Diverticulosis of colon   . DJD (degenerative joint disease)   . GERD (gastroesophageal reflux disease)   . History of colonic polyps   . Hypercholesteremia   . Hypertension   . IBS (irritable bowel syndrome)   . Lacunar infarction ([place])   . Lumbar back pain   . Malnutrition ([place])   . UTI (lower urinary tract infection)   . Vitamin D deficiency     Patient Active Problem List   Diagnosis Date Noted  . Pressure injury of skin 04/16/2020  . Fall at home, initial encounter 04/15/2020  . Palliative care by specialist   . Hospice care patient   . Acute urinary retention 01/30/2020  . Dehydration 01/30/2020  . UTI (urinary tract infection) 01/29/2020  . Fecal impaction ([place]) 01/29/2020  . Acute metabolic encephalopathy 20/94/7096  . AMS (altered mental status) 04/27/2019  . Hallucinations   . Acute blood loss anemia 01/15/2017  . Hip fracture, left ([place]) 01/12/2017  . Hip fracture ([place]) 01/12/2017  . S/p left hip fracture 01/12/2017  . Dementia with psychosis ([place]) 07/26/2016  . Dysuria 08/10/2015  . Hypothyroidism 06/06/2015  . Protein-calorie malnutrition, severe ([place]) 06/26/2014  .  Constipation 12/17/2013  . COPD (chronic obstructive pulmonary disease) ([place]) 06/03/2013  . IBS (irritable bowel syndrome) 12/30/2012  . Generalized anxiety disorder 12/30/2012  . Dyslipidemia 12/12/2012  . HTN (hypertension) 12/12/2012  . Hx of completed stroke 07/11/2009    Past Surgical History:  Procedure Laterality Date  . ABDOMINAL HYSTERECTOMY    . FEMUR IM NAIL Left 01/13/2017   Procedure: TROCHANTERIC INTRAMEDULLARY (IM) NAIL FEMORAL;  Surgeon: Renette Butters, MD;  Location: [facility];  Service: Orthopedics;  Laterality: Left;     OB History   No obstetric history on file.     Family History  Problem Relation Age of Onset  . Hypertension Other     Social History   Tobacco Use  . Smoking status: Never Smoker  . Smokeless tobacco: Never Used  Vaping Use  . Vaping Use: Never used  Substance Use Topics  . Alcohol use: No    Alcohol/week: 0.0 standard drinks  . Drug use: No    Home Medications Prior to Admission medications   Medication Sig Start Date End Date Taking? Authorizing Provider  acetaminophen (TYLENOL) 500 MG tablet Take 500 mg by mouth every 4 (four) hours as needed for mild pain, fever or headache.    [provider]  alum & mag hydroxide-simeth (MAALOX/MYLANTA) 200-200-20 MG/5ML suspension Take 30 mLs by mouth every 6 (six) hours as needed for indigestion or heartburn.    [provider]  feeding supplement (ENSURE ENLIVE / ENSURE PLUS) LIQD Take 237 mLs by mouth 3 (three) times daily between meals. Patient not taking: Reported on 08/15/2020 04/18/20   Cherylann Ratel A, DO  gabapentin (NEURONTIN) 100 MG capsule Take 100 mg by mouth 2 (two) times daily.    [provider]  lamoTRIgine (LAMICTAL) 25 MG tablet Take 3 tablets (75 mg total) by mouth 2 (two) times daily. Patient taking differently: Take 50 mg by mouth 2 (two) times daily. 04/08/17   Ethelene Hal, NP  levothyroxine (SYNTHROID, LEVOTHROID) 50 MCG tablet TAKE 1  TABLET (50 MCG TOTAL) BY MOUTH DAILY BEFORE BREAKFAST. 01/06/17   Golden Circle, FNP  Lidocaine HCl (ASPERCREME LIDOCAINE) 4 % CREA Apply 1 application topically 2 (two) times daily as needed (right knee pain).    [provider]  loperamide (IMODIUM) 2 MG capsule Take 2 mg by mouth as needed for diarrhea or loose stools.    [provider]  magnesium hydroxide (MILK OF MAGNESIA) 400 MG/5ML suspension Take 30 mLs by mouth at bedtime as needed for mild constipation.    [provider]  neomycin-bacitracin-polymyxin (NEOSPORIN) [id number] ointment Apply 1 application topically daily as needed (skin abrasions).    [provider]  oxyCODONE (OXY IR/ROXICODONE) 5 MG immediate release tablet Take 5 mg by mouth every 6 (six) hours as needed (pain).    [provider]  PRESCRIPTION MEDICATION Take 1 each by mouth in the morning, at noon, and at bedtime. Mighty shake    [provider]  senna (SENOKOT) 8.6 MG TABS tablet Take 2 tablets by mouth 2 (two) times daily.     [provider]  trolamine salicylate (ASPERCREME) 10 % cream Apply 1 application topically in the morning and at bedtime. Right knee    [provider]  ZINC OXIDE EX Apply 1 application topically as needed (buttocks/peri area for incontinence).    [provider]    Allergies    Demerol [meperidine], Influenza vaccines, Amoxicillin, Clarithromycin, Morphine and related, Peanuts [peanut oil], Prevpac [amoxicill-clarithro-lansopraz], and Telithromycin  Review of Systems   Review of Systems  Constitutional: Negative for chills and fever.  HENT: Negative for ear pain and sore throat.   Eyes: Negative for pain and visual disturbance.  Respiratory: Negative for cough and shortness of breath.   Cardiovascular: Negative for chest pain and palpitations.  Gastrointestinal: Negative for abdominal pain and vomiting.  Genitourinary: Negative for dysuria and  hematuria.  Musculoskeletal: Negative for arthralgias and back pain.  Skin: Positive for wound. Negative for color change and rash.  Neurological: Negative for seizures and syncope.  All other systems reviewed and are negative.   Physical Exam Updated Vital Signs BP (!) 172/87 (BP Location: Left Arm)   Pulse 66   Temp (!) 94 F (34.4 C) (Rectal)   Resp 16   Ht 5\' 2"  (1.575 m)   Wt 61.2 kg   SpO2 98%   BMI 24.69 kg/m   Physical Exam Vitals and nursing note reviewed.  HENT:     Head: Normocephalic and atraumatic.      Comments: Stellate laceration at the right medial and superior aspect of the forehead. Small, linear laceration over the left eyebrow. Eyes:     General: No scleral icterus. Pulmonary:     Effort: Pulmonary effort is normal. No respiratory distress.  Musculoskeletal:     Cervical back: Normal range of motion.     Comments: Small, superficial abrasions on both knees.  Skin:    General: Skin is warm and dry.  Neurological:     General: No focal deficit present.     Mental Status: She is alert.  Psychiatric:        Mood and Affect: Mood normal.     ED Results / Procedures / Treatments   Labs (all labs ordered are listed, but only abnormal results are displayed) Labs Reviewed - No data to display  EKG None  Radiology CT Head Wo Contrast  Result Date: 10/07/2020 CLINICAL DATA:  Head trauma, laceration to head. EXAM: CT HEAD WITHOUT CONTRAST TECHNIQUE: Contiguous axial images were obtained from the base of the skull through the vertex without intravenous contrast. COMPARISON:  None. FINDINGS: Brain: Mild generalized age related parenchymal volume loss with commensurate dilatation of the ventricles and sulci. Chronic small vessel ischemic changes within the bilateral periventricular and subcortical white matter regions. No mass, hemorrhage, edema or other evidence of acute parenchymal abnormality. No extra-axial hemorrhage. Vascular: Chronic calcified  atherosclerotic changes of the large vessels at the skull base. No unexpected hyperdense vessel. Skull: Normal. Negative for fracture or focal lesion. Sinuses/Orbits: No acute finding. Other: Soft tissue laceration overlying the midline frontal bones, with overlying bandages in place. No underlying skull fracture. IMPRESSION: 1. Soft tissue laceration overlying the midline frontal bones, with overlying bandages in place. No underlying skull fracture. 2. No acute intracranial abnormality. No intracranial mass, hemorrhage or edema. No skull fracture. 3. Chronic small vessel ischemic changes in the white matter. Electronically Signed   By: Franki Cabot M.D.   On: 10/07/2020 13:52    Procedures .Marland KitchenLaceration Repair  Date/Time: 10/07/2020 2:23 PM Performed by: Arnaldo Natal, MD Authorized by: Arnaldo Natal, MD   Consent:    Consent obtained:  Verbal   Consent given by:  Patient   Risks, benefits, and alternatives were discussed: yes     Risks discussed:  Infection, need for additional repair, pain, poor cosmetic result, poor wound healing and retained foreign body   Alternatives discussed:  No treatment and delayed treatment Universal protocol:    Immediately prior to procedure, a time out was called: yes     Patient identity confirmed:  Verbally with patient Anesthesia:    Anesthesia method:  Local infiltration   Local anesthetic:  Lidocaine 1% w/o epi Laceration details:    Location:  Face   Face location:  Forehead   Length (cm):  2   Depth (mm):  5 Pre-procedure details:    Preparation:  Patient was prepped and draped in usual sterile fashion Exploration:    Limited defect created (wound extended): no     Hemostasis achieved with:  Direct pressure   Wound exploration: wound explored through full range of motion and entire depth of wound visualized     Wound extent: no foreign bodies/material noted, no muscle damage noted, no nerve damage noted, no underlying fracture noted and no  vascular damage noted     Contaminated: no   Treatment:    Area cleansed with:  Povidone-iodine   Amount of cleaning:  Standard   Irrigation solution:  Sterile saline   Irrigation volume:  20   Irrigation method:  Syringe   Visualized foreign bodies/material removed: no     Debridement:  None   Undermining:  None   Scar revision: no   Skin repair:    Repair method:  Sutures   Suture size:  5-0   Suture material:  Nylon   Suture technique:  Simple interrupted   Number of sutures:  4 Approximation:    Approximation:  Close Repair type:    Repair type:  Simple Post-procedure details:    Dressing:  Adhesive bandage   Procedure completion:  Tolerated well, no immediate complications .Marland KitchenLaceration Repair  Date/Time: 10/07/2020 2:25 PM Performed by: Arnaldo Natal, MD Authorized by: Arnaldo Natal, MD   Consent:    Consent obtained:  Verbal   Consent given by:  Patient   Risks, benefits, and alternatives were discussed: yes     Risks discussed:  Infection, need for additional repair, pain, poor cosmetic result, poor wound healing and retained foreign body   Alternatives discussed:  No treatment, delayed treatment and observation Universal protocol:    Immediately prior to procedure, a time out was called: yes     Patient identity confirmed:  Verbally with patient Anesthesia:    Anesthesia method:  Local infiltration   Local anesthetic:  Lidocaine 1% w/o epi Laceration details:    Location:  Face   Face location:  Forehead   Length (cm):  2   Depth (mm):  2 Pre-procedure details:    Preparation:  Patient was prepped and draped in usual sterile fashion Exploration:    Limited defect created (wound extended): no     Hemostasis achieved with:  Direct pressure   Wound exploration: wound explored through full range of motion and entire depth of wound visualized     Wound extent: no foreign bodies/material noted, no muscle damage noted, no nerve damage noted, no tendon damage  noted, no underlying fracture noted and no vascular damage noted     Contaminated: no   Treatment:    Area cleansed with:  Povidone-iodine   Amount of cleaning:  Standard   Irrigation solution:  Sterile saline   Irrigation volume:  10 cc   Irrigation method:  Syringe   Visualized foreign bodies/material removed: no     Debridement:  None   Undermining:  None   Scar revision: no   Skin repair:    Repair method:  Sutures   Suture size:  5-0   Suture material:  Nylon   Suture technique:  Simple interrupted   Number of sutures:  3 Approximation:    Approximation:  Close Repair type:    Repair type:  Simple Post-procedure details:    Dressing:  Adhesive bandage   Procedure completion:  Tolerated well, no immediate complications     Medications Ordered in ED Medications  lidocaine (PF) (XYLOCAINE) 1 % injection 5 mL (has no administration in time range)    ED Course  I have reviewed the triage vital signs and the nursing notes.  Pertinent labs & imaging results that were available during my care of the patient were reviewed by me and considered in my medical decision making (see chart for details).    MDM Rules/Calculators/A&P                          KELDA AZAD is on hospice.  She presents after a mechanical fall.  She sustained 2 lacerations on her forehead which have been repaired.  No evidence of intracranial trauma.  No other significant injuries aside from some superficial abrasions on her knees.  She is pleasant and in no distress. Final Clinical Impression(s) / ED Diagnoses Final diagnoses:  Fall, initial encounter  Facial laceration, initial encounter    Rx / DC Orders ED Discharge Orders  None       Arnaldo Natal, MD 10/07/20 1426

## 2020-10-07 NOTE — Discharge Instructions (Addendum)
Sutures need to be removed in 5 days.

## 2020-10-07 NOTE — Progress Notes (Signed)
Manufacturing engineer [facility])  Ms. Tarte is our current hospice patient who resides at [facility]. She has a CTI of Cerebrovascular disease. Staff reported a fall with laceration to the head. [facility] will continue to follow this patient until discharged. At this time unsure if patient will be admitted.   Please feel free to call with any hospice related questions.  Clementeen Hoof, BSN, [employer] [phone]

## 2020-10-07 NOTE — ED Notes (Signed)
ptar called 

## 2020-10-07 NOTE — ED Triage Notes (Signed)
Ems brings pt in from SNF. Staff reports pt fell from wheelchair and hit forehead. Laceration noted, bleeding controlled.

## 2020-10-07 NOTE — ED Notes (Signed)
Facility called with report

## 2021-03-15 ENCOUNTER — Emergency Department (HOSPITAL_COMMUNITY)

## 2021-03-15 ENCOUNTER — Encounter (HOSPITAL_COMMUNITY): Payer: Self-pay | Admitting: Emergency Medicine

## 2021-03-15 ENCOUNTER — Emergency Department (HOSPITAL_COMMUNITY)
Admission: EM | Admit: 2021-03-15 | Discharge: 2021-03-15 | Disposition: A | Discharge status: Home / Self Care | Attending: Emergency Medicine | Admitting: Emergency Medicine

## 2021-03-15 DIAGNOSIS — Z23 Encounter for immunization: Secondary | ICD-10-CM | POA: Insufficient documentation

## 2021-03-15 DIAGNOSIS — S0083XA Contusion of other part of head, initial encounter: Secondary | ICD-10-CM | POA: Diagnosis not present

## 2021-03-15 DIAGNOSIS — Z91010 Allergy to peanuts: Secondary | ICD-10-CM | POA: Diagnosis not present

## 2021-03-15 DIAGNOSIS — E039 Hypothyroidism, unspecified: Secondary | ICD-10-CM | POA: Insufficient documentation

## 2021-03-15 DIAGNOSIS — S0990XA Unspecified injury of head, initial encounter: Secondary | ICD-10-CM | POA: Diagnosis present

## 2021-03-15 DIAGNOSIS — J449 Chronic obstructive pulmonary disease, unspecified: Secondary | ICD-10-CM | POA: Insufficient documentation

## 2021-03-15 DIAGNOSIS — I10 Essential (primary) hypertension: Secondary | ICD-10-CM | POA: Diagnosis not present

## 2021-03-15 DIAGNOSIS — F0390 Unspecified dementia without behavioral disturbance: Secondary | ICD-10-CM | POA: Diagnosis not present

## 2021-03-15 DIAGNOSIS — W07XXXA Fall from chair, initial encounter: Secondary | ICD-10-CM | POA: Insufficient documentation

## 2021-03-15 DIAGNOSIS — Z79899 Other long term (current) drug therapy: Secondary | ICD-10-CM | POA: Diagnosis not present

## 2021-03-15 DIAGNOSIS — W19XXXA Unspecified fall, initial encounter: Secondary | ICD-10-CM

## 2021-03-15 MED ORDER — TETANUS-DIPHTH-ACELL PERTUSSIS 5-2.5-18.5 LF-MCG/0.5 IM SUSY
0.5000 mL | PREFILLED_SYRINGE | Freq: Once | INTRAMUSCULAR | Status: AC
  Administered 2021-03-15: 0.5 mL via INTRAMUSCULAR
  Filled 2021-03-15: qty 0.5

## 2021-03-15 NOTE — ED Notes (Signed)
PTAR notified for pt transfer to [facility].

## 2021-03-15 NOTE — ED Notes (Signed)
PTAR CALLED TO TRANSPORT PT BACK TO [facility]

## 2021-03-15 NOTE — ED Triage Notes (Signed)
Per EMS-states she was in her W/C with staff-fell forward out of chair hitting head on floor-no blood thinners, no LOC-small abrasion to left forehead-patient has a history of dementia and is at baseline per staff-from [facility]

## 2021-03-15 NOTE — ED Notes (Signed)
Patients daughter would like a call back with an update: Rachell Cipro [phone]

## 2021-03-15 NOTE — Progress Notes (Signed)
Manufacturing engineer [facility])  Hospitalized Hospice Patient   Kathryn Whitaker is a current hospice patient who resides at [facility].  She was admitted onto hospice service on 08/20/19 with a terminal diagnosis of cerebrovascular disease. Today pt had a witnessed fall from her wheelchair; [facility] case manager witnessed fall.  [facility] liaisons will continue to follow to assist with discharge planning and to provide continuity of hospice services.  Thank you for the opportunity to participate in this patient's care.  Domenic Moras, BSN, RN [facility] liaison [phone] (24h on call)

## 2021-03-15 NOTE — ED Provider Notes (Signed)
[facility] DEPT Provider Note   CSN: [id number] Arrival date & time: 03/15/21  1359     History Chief Complaint  Patient presents with   Kathryn Whitaker is a 87 y.o. female with medical history as noted below.  Presents to the emergency department with a chief complaint of fall and head injury.  Patient has a history of dementia and is alert to self at baseline.  Level 5 caveat applies.  Spoke to RN [employer] who is in the room with patient during fall.  Reports patient was leaning forward in wheelchair and fell out.  Patient had no loss of consciousness.  Patient was alert and communicative with RN after fall.  Does not take any blood thinners.  Spoke to patient's daughter at bedside who reports that patient has been alert and talkative with her in the emergency department.   Fall      Past Medical History:  Diagnosis Date   Acute asthmatic bronchitis    Allergic rhinitis    Alzheimer's dementia ([place])    Anxiety    Chronic constipation    Diverticulosis of colon    DJD (degenerative joint disease)    GERD (gastroesophageal reflux disease)    History of colonic polyps    Hypercholesteremia    Hypertension    IBS (irritable bowel syndrome)    Lacunar infarction ([place])    Lumbar back pain    Malnutrition ([place])    UTI (lower urinary tract infection)    Vitamin D deficiency     Patient Active Problem List   Diagnosis Date Noted   Pressure injury of skin 04/16/2020   Fall at home, initial encounter 04/15/2020   Palliative care by specialist    Hospice care patient    Acute urinary retention 01/30/2020   Dehydration 01/30/2020   UTI (urinary tract infection) 01/29/2020   Fecal impaction ([place]) 93/81/8299   Acute metabolic encephalopathy 37/16/9678   AMS (altered mental status) 04/27/2019   Hallucinations    Acute blood loss anemia 01/15/2017   Hip fracture, left ([place]) 01/12/2017   Hip fracture ([place]) 01/12/2017   S/p left  hip fracture 01/12/2017   Dementia with psychosis ([place]) 07/26/2016   Dysuria 08/10/2015   Hypothyroidism 06/06/2015   Protein-calorie malnutrition, severe ([place]) 06/26/2014   Constipation 12/17/2013   COPD (chronic obstructive pulmonary disease) ([place]) 06/03/2013   IBS (irritable bowel syndrome) 12/30/2012   Generalized anxiety disorder 12/30/2012   Dyslipidemia 12/12/2012   HTN (hypertension) 12/12/2012   Hx of completed stroke 07/11/2009    Past Surgical History:  Procedure Laterality Date   ABDOMINAL HYSTERECTOMY     FEMUR IM NAIL Left 01/13/2017   Procedure: TROCHANTERIC INTRAMEDULLARY (IM) NAIL FEMORAL;  Surgeon: Renette Butters, MD;  Location: [facility];  Service: Orthopedics;  Laterality: Left;     OB History   No obstetric history on file.     Family History  Problem Relation Age of Onset   Hypertension Other     Social History   Tobacco Use   Smoking status: Never   Smokeless tobacco: Never  Vaping Use   Vaping Use: Never used  Substance Use Topics   Alcohol use: No    Alcohol/week: 0.0 standard drinks   Drug use: No    Home Medications Prior to Admission medications   Medication Sig Start Date End Date Taking? Authorizing Provider  acetaminophen (TYLENOL) 500 MG tablet Take 500 mg by mouth every 4 (four)  hours as needed for mild pain, fever or headache.    [provider]  alum & mag hydroxide-simeth (MAALOX/MYLANTA) 200-200-20 MG/5ML suspension Take 30 mLs by mouth every 6 (six) hours as needed for indigestion or heartburn.    [provider]  feeding supplement (ENSURE ENLIVE / ENSURE PLUS) LIQD Take 237 mLs by mouth 3 (three) times daily between meals. Patient not taking: Reported on 08/15/2020 04/18/20   Cherylann Ratel A, DO  gabapentin (NEURONTIN) 100 MG capsule Take 100 mg by mouth 2 (two) times daily.    [provider]  lamoTRIgine (LAMICTAL) 25 MG tablet Take 3 tablets (75 mg total) by mouth 2 (two) times daily. Patient taking  differently: Take 50 mg by mouth 2 (two) times daily. 04/08/17   Ethelene Hal, NP  levothyroxine (SYNTHROID, LEVOTHROID) 50 MCG tablet TAKE 1 TABLET (50 MCG TOTAL) BY MOUTH DAILY BEFORE BREAKFAST. 01/06/17   Golden Circle, FNP  Lidocaine HCl (ASPERCREME LIDOCAINE) 4 % CREA Apply 1 application topically 2 (two) times daily as needed (right knee pain).    [provider]  loperamide (IMODIUM) 2 MG capsule Take 2 mg by mouth as needed for diarrhea or loose stools.    [provider]  magnesium hydroxide (MILK OF MAGNESIA) 400 MG/5ML suspension Take 30 mLs by mouth at bedtime as needed for mild constipation.    [provider]  neomycin-bacitracin-polymyxin (NEOSPORIN) [id number] ointment Apply 1 application topically daily as needed (skin abrasions).    [provider]  oxyCODONE (OXY IR/ROXICODONE) 5 MG immediate release tablet Take 5 mg by mouth every 6 (six) hours as needed (pain).    [provider]  PRESCRIPTION MEDICATION Take 1 each by mouth in the morning, at noon, and at bedtime. Mighty shake    [provider]  senna (SENOKOT) 8.6 MG TABS tablet Take 2 tablets by mouth 2 (two) times daily.     [provider]  trolamine salicylate (ASPERCREME) 10 % cream Apply 1 application topically in the morning and at bedtime. Right knee    [provider]  ZINC OXIDE EX Apply 1 application topically as needed (buttocks/peri area for incontinence).    [provider]    Allergies    Demerol [meperidine], Influenza vaccines, Amoxicillin, Clarithromycin, Morphine and related, Peanuts [peanut oil], Prevpac [amoxicill-clarithro-lansopraz], and Telithromycin  Review of Systems   Review of Systems  Unable to perform ROS: Dementia   Physical Exam Updated Vital Signs BP (!) 156/70   Pulse 67   Temp 97.7 F (36.5 C)   Resp 16   SpO2 97%   Physical Exam Vitals and nursing note reviewed.  Constitutional:       General: She is sleeping. She is not in acute distress.    Appearance: She is not ill-appearing, toxic-appearing or diaphoretic.  HENT:     Head: Normocephalic. Contusion and laceration present. No raccoon eyes, Battle's sign, abrasion or masses.     Jaw: No tenderness.     Comments: Skin tear and contusion to left forehead. Eyes:     General: No scleral icterus.       Right eye: No discharge.        Left eye: No discharge.  Cardiovascular:     Rate and Rhythm: Normal rate.  Pulmonary:     Effort: Pulmonary effort is normal.     Comments: Lungs clear to auscultation bilaterally. Chest:     Comments: No tenderness, deformity, or ecchymosis noted to chest wall.  Musculoskeletal:     Cervical back: No edema, erythema, signs of trauma or crepitus. No spinous process tenderness or muscular tenderness.     Comments: No midline tenderness or deformity to cervical, thoracic, or lumbar spine.  Patient has no bony tenderness, tenderness, or deformity to bilateral upper or lower extremities.  Pelvis stable.  Patient has superficial abrasion to left shoulder and left knee.  Patient has contractures to bilateral lower and upper extremities  Skin:    General: Skin is warm and dry.  Neurological:     General: No focal deficit present.     Mental Status: She is easily aroused. Mental status is at baseline.     Comments: Patient is alert to self only at baseline.  Patient sometimes does not respond at baseline.    ED Results / Procedures / Treatments   Labs (all labs ordered are listed, but only abnormal results are displayed) Labs Reviewed - No data to display  EKG None  Radiology CT HEAD WO CONTRAST (5MM)  Result Date: 03/15/2021 CLINICAL DATA:  Facial trauma 87 year old post fall out of wheelchair striking head on floor. EXAM: CT HEAD WITHOUT CONTRAST TECHNIQUE: Contiguous axial images were obtained from the base of the skull through the vertex without intravenous contrast. COMPARISON:  Head  CT 10/07/2020 FINDINGS: Brain: Stable degree of atrophy and chronic small vessel ischemia. No intracranial hemorrhage, mass effect, or midline shift. No hydrocephalus. The basilar cisterns are patent. Punctate remote lacunar infarcts in the left basal ganglia. No evidence of territorial infarct or acute ischemia. Enlarged partially empty sella is likely incidental in this setting. No extra-axial or intracranial fluid collection. Vascular: Atherosclerosis of skullbase vasculature without hyperdense vessel or abnormal calcification. Skull: Left frontal scalp hematoma. No skull fracture or focal bone abnormality. Sinuses/Orbits: Left supraorbital scalp hematoma. No evidence of orbital or facial bone fracture. Bilateral cataract resection. Mastoid air cells are clear. Other: None. IMPRESSION: 1. Left frontal scalp hematoma. No acute intracranial abnormality. No skull fracture. 2. Stable atrophy and chronic small vessel ischemia. Electronically Signed   By: Keith Rake M.D.   On: 03/15/2021 17:40   CT Cervical Spine Wo Contrast  Result Date: 03/15/2021 CLINICAL DATA:  Neck trauma (Age >= 10y) 87 year old post fall out of wheelchair striking head on floor. EXAM: CT CERVICAL SPINE WITHOUT CONTRAST TECHNIQUE: Multidetector CT imaging of the cervical spine was performed without intravenous contrast. Multiplanar CT image reconstructions were also generated. COMPARISON:  Cervical spine CT 08/15/2020 FINDINGS: Alignment: Stable straightening of normal lordosis.  No listhesis. Skull base and vertebrae: No acute fracture. Vertebral body heights are maintained. The dens and skull base are intact. Soft tissues and spinal canal: No prevertebral fluid or swelling. No visible canal hematoma. Disc levels: Mild degenerative disc disease and facet hypertrophy for age. No canal stenosis. Upper chest: No acute findings. Remote right clavicle fracture is included. Other: None. IMPRESSION: No acute fracture or subluxation of the  cervical spine. Electronically Signed   By: Keith Rake M.D.   On: 03/15/2021 17:42   DG Shoulder Left  Result Date: 03/15/2021 CLINICAL DATA:  Recent fall with shoulder pain, initial encounter EXAM: LEFT SHOULDER - 2+ VIEW COMPARISON:  04/30/2017 FINDINGS: Degenerative changes of the acromioclavicular joint and glenohumeral joint are seen. No acute fracture or dislocation is noted. Underlying bony thorax appears within normal limits. IMPRESSION: Degenerative change without acute abnormality. Electronically Signed   By: Inez Catalina M.D.   On: 03/15/2021 18:24    Procedures Procedures  Medications Ordered in ED Medications  Tdap (BOOSTRIX) injection 0.5 mL (0.5 mLs Intramuscular Given 03/15/21 1858)    ED Course  I have reviewed the triage vital signs and the nursing notes.  Pertinent labs & imaging results that were available during my care of the patient were reviewed by me and considered in my medical decision making (see chart for details).  Clinical Course as of 03/15/21 1919  Thu Mar 15, 2021  1455 Contacted hospice nurse Amy Wynetta Emery who was in the room with patient when she fell.  Contacted patient's son Ephriam Knuckles who is healthcare power of attorney to update him on patient's condition, and treatment plan. [PB]    Clinical Course User Index [PB] Dyann Ruddle   MDM Rules/Calculators/A&P                           Sleeping 87 year old female no acute distress, nontoxic appearing.  Patient aroused to voice and touch.  Patient has history of dementia per RN and daughter alert to self at baseline.  Patient's RN also reports patient will occasionally not talk or respond at baseline.  Patient suffered fall from wheelchair striking her head.  No loss of consciousness.  Patient is not on any blood thinners.  Unknown last tetanus shot.  Due to skin tear will update patient's tetanus.  Due to patient's age and head injury will obtain CT imaging of head and cervical  spine.  Patient has abrasion to left shoulder no deformity or tenderness, concern for possible injury will obtain x-rays.  CT cervical spine shows no acute abnormality CT head shows no acute intracranial abnormality. X-ray left shoulder shows no acute fracture or dislocation.  Will have patient return to SNF.  Patient to follow-up with primary care provider as needed.    Patient care and treatment were discussed with attending physician Dr. Jeanell Sparrow.  Final Clinical Impression(s) / ED Diagnoses Final diagnoses:  Fall, initial encounter  Contusion of face, initial encounter    Rx / DC Orders ED Discharge Orders     None        Dyann Ruddle 03/15/21 Jackquline Bosch, MD 03/20/21 1214

## 2021-03-15 NOTE — ED Notes (Signed)
Patient is resting comfortably. 

## 2021-03-15 NOTE — Discharge Instructions (Addendum)
Kathryn Whitaker came to the emergency department after suffering a fall.  She was noted to have a skin tear and contusion to her forehead.  The skin tear was approximated as best as possible.  CT imaging of head and cervical neck showed no acute abnormalities.  X-ray imaging of left shoulder showed no acute abnormalities.  Patient's tetanus shot was updated.
